# Patient Record
Sex: Female | Born: 1950 | Race: White | Hispanic: No | Smoking: Current every day smoker
Health system: Southern US, Community
[De-identification: ages and names within clinical notes are randomized; demographics above are authoritative.]

## PROBLEM LIST (undated history)

## (undated) DIAGNOSIS — I639 Cerebral infarction, unspecified: Secondary | ICD-10-CM

## (undated) DIAGNOSIS — I517 Cardiomegaly: Secondary | ICD-10-CM

## (undated) DIAGNOSIS — K829 Disease of gallbladder, unspecified: Secondary | ICD-10-CM

## (undated) DIAGNOSIS — Z950 Presence of cardiac pacemaker: Secondary | ICD-10-CM

## (undated) DIAGNOSIS — I219 Acute myocardial infarction, unspecified: Secondary | ICD-10-CM

## (undated) DIAGNOSIS — F4310 Post-traumatic stress disorder, unspecified: Secondary | ICD-10-CM

## (undated) DIAGNOSIS — I340 Nonrheumatic mitral (valve) insufficiency: Secondary | ICD-10-CM

## (undated) DIAGNOSIS — I7100 Dissection of unspecified site of aorta: Secondary | ICD-10-CM

## (undated) DIAGNOSIS — I509 Heart failure, unspecified: Secondary | ICD-10-CM

## (undated) DIAGNOSIS — G4730 Sleep apnea, unspecified: Secondary | ICD-10-CM

## (undated) DIAGNOSIS — I071 Rheumatic tricuspid insufficiency: Secondary | ICD-10-CM

## (undated) DIAGNOSIS — E119 Type 2 diabetes mellitus without complications: Secondary | ICD-10-CM

## (undated) DIAGNOSIS — Z972 Presence of dental prosthetic device (complete) (partial): Secondary | ICD-10-CM

## (undated) DIAGNOSIS — Z95810 Presence of automatic (implantable) cardiac defibrillator: Secondary | ICD-10-CM

## (undated) DIAGNOSIS — I2721 Secondary pulmonary arterial hypertension: Secondary | ICD-10-CM

## (undated) HISTORY — PX: CARDIAC SURGERY: SHX584

## (undated) HISTORY — PX: ABDOMINAL HYSTERECTOMY: SHX81

## (undated) HISTORY — PX: STENT PLACE LEFT URETER (ARMC HX): HXRAD1254

## (undated) HISTORY — PX: INSERT / REPLACE / REMOVE PACEMAKER: SUR710

## (undated) HISTORY — PX: CHOLECYSTECTOMY: SHX55

## (undated) HISTORY — PX: CARDIAC CATHETERIZATION: SHX172

---

## 2008-01-07 ENCOUNTER — Inpatient Hospital Stay: Payer: Self-pay | Admitting: Surgery

## 2008-01-07 ENCOUNTER — Other Ambulatory Visit: Payer: Self-pay

## 2008-05-02 DIAGNOSIS — I7100 Dissection of unspecified site of aorta: Secondary | ICD-10-CM

## 2008-05-02 HISTORY — PX: CAROTID STENT: SHX1301

## 2008-05-02 HISTORY — DX: Dissection of unspecified site of aorta: I71.00

## 2009-01-24 DIAGNOSIS — I219 Acute myocardial infarction, unspecified: Secondary | ICD-10-CM

## 2009-01-24 HISTORY — DX: Acute myocardial infarction, unspecified: I21.9

## 2009-02-07 ENCOUNTER — Inpatient Hospital Stay: Payer: Self-pay | Admitting: Internal Medicine

## 2009-09-07 ENCOUNTER — Ambulatory Visit: Payer: Self-pay | Admitting: Family Medicine

## 2009-09-10 ENCOUNTER — Ambulatory Visit: Payer: Self-pay | Admitting: Family Medicine

## 2009-09-18 ENCOUNTER — Emergency Department: Payer: Self-pay | Admitting: Emergency Medicine

## 2009-09-19 ENCOUNTER — Encounter (INDEPENDENT_AMBULATORY_CARE_PROVIDER_SITE_OTHER): Payer: Self-pay | Admitting: Emergency Medicine

## 2009-09-19 ENCOUNTER — Inpatient Hospital Stay (HOSPITAL_COMMUNITY)
Admission: EM | Admit: 2009-09-19 | Discharge: 2009-09-23 | Payer: Self-pay | Source: Home / Self Care | Admitting: Emergency Medicine

## 2009-09-19 ENCOUNTER — Ambulatory Visit: Payer: Self-pay | Admitting: Vascular Surgery

## 2009-09-22 ENCOUNTER — Encounter (INDEPENDENT_AMBULATORY_CARE_PROVIDER_SITE_OTHER): Payer: Self-pay | Admitting: Internal Medicine

## 2010-07-19 LAB — RPR: RPR Ser Ql: NONREACTIVE

## 2010-07-19 LAB — POCT I-STAT, CHEM 8
Calcium, Ion: 1.1 mmol/L — ABNORMAL LOW (ref 1.12–1.32)
Chloride: 109 mEq/L (ref 96–112)
Creatinine, Ser: 0.7 mg/dL (ref 0.4–1.2)
Glucose, Bld: 129 mg/dL — ABNORMAL HIGH (ref 70–99)
HCT: 42 % (ref 36.0–46.0)
Hemoglobin: 14.3 g/dL (ref 12.0–15.0)
TCO2: 25 mmol/L (ref 0–100)

## 2010-07-19 LAB — BASIC METABOLIC PANEL
BUN: 13 mg/dL (ref 6–23)
BUN: 16 mg/dL (ref 6–23)
CO2: 28 mEq/L (ref 19–32)
Chloride: 104 mEq/L (ref 96–112)
Creatinine, Ser: 0.74 mg/dL (ref 0.4–1.2)
GFR calc non Af Amer: >60 mL/min (ref >60)
GFR calc non Af Amer: >60 mL/min (ref >60)
Glucose, Bld: 94 mg/dL (ref 70–99)
Sodium: 140 mEq/L (ref 135–145)

## 2010-07-19 LAB — LIPID PANEL
Total CHOL/HDL Ratio: 4.3 RATIO
Triglycerides: 119 mg/dL (ref <150)
VLDL: 24 mg/dL (ref 0–40)

## 2010-07-19 LAB — DIFFERENTIAL
Basophils Absolute: 0.1 10*3/uL (ref 0.0–0.1)
Lymphocytes Relative: 12 % (ref 12–46)
Monocytes Absolute: 0.4 10*3/uL (ref 0.1–1.0)
Monocytes Relative: 4 % (ref 3–12)
Neutrophils Relative %: 83 % — ABNORMAL HIGH (ref 43–77)

## 2010-07-19 LAB — HEPATIC FUNCTION PANEL
ALT: 14 U/L (ref 0–35)
Albumin: 3.2 g/dL — ABNORMAL LOW (ref 3.5–5.2)
Alkaline Phosphatase: 110 U/L (ref 39–117)
Total Protein: 6.6 g/dL (ref 6.0–8.3)

## 2010-07-19 LAB — CK TOTAL AND CKMB (NOT AT ARMC): Relative Index: INVALID (ref 0.0–2.5)

## 2010-07-19 LAB — POCT CARDIAC MARKERS
CKMB, poc: 1.4 ng/mL (ref 1.0–8.0)
Myoglobin, poc: 65.4 ng/mL (ref 12–200)
Troponin i, poc: <0.05 ng/mL (ref 0.00–0.09)
Troponin i, poc: <0.05 ng/mL (ref 0.00–0.09)

## 2010-07-19 LAB — COMPREHENSIVE METABOLIC PANEL
Alkaline Phosphatase: 105 U/L (ref 39–117)
BUN: 13 mg/dL (ref 6–23)
Calcium: 9 mg/dL (ref 8.4–10.5)
GFR calc Af Amer: >60 mL/min (ref >60)
GFR calc non Af Amer: >60 mL/min (ref >60)
Glucose, Bld: 125 mg/dL — ABNORMAL HIGH (ref 70–99)
Potassium: 3.8 mEq/L (ref 3.5–5.1)
Total Protein: 7 g/dL (ref 6.0–8.3)

## 2010-07-19 LAB — CBC
HCT: 35.2 % — ABNORMAL LOW (ref 36.0–46.0)
Hemoglobin: 11.7 g/dL — ABNORMAL LOW (ref 12.0–15.0)
MCHC: 33.2 g/dL (ref 30.0–36.0)
MCV: 79.4 fL (ref 78.0–100.0)
Platelets: 220 10*3/uL (ref 150–400)
Platelets: 234 10*3/uL (ref 150–400)
RBC: 4.44 MIL/uL (ref 3.87–5.11)
RBC: 4.86 MIL/uL (ref 3.87–5.11)
RDW: 17.7 % — ABNORMAL HIGH (ref 11.5–15.5)
RDW: 17.8 % — ABNORMAL HIGH (ref 11.5–15.5)
WBC: 8.4 10*3/uL (ref 4.0–10.5)
WBC: 9.7 10*3/uL (ref 4.0–10.5)

## 2010-07-19 LAB — URINALYSIS, ROUTINE W REFLEX MICROSCOPIC
Hgb urine dipstick: NEGATIVE
Nitrite: NEGATIVE
Specific Gravity, Urine: 1.026 (ref 1.005–1.030)
Urobilinogen, UA: 0.2 mg/dL (ref 0.0–1.0)

## 2010-07-19 LAB — PROTIME-INR
INR: 1.02 (ref 0.00–1.49)
Prothrombin Time: 13 seconds (ref 11.6–15.2)
Prothrombin Time: 13.3 seconds (ref 11.6–15.2)

## 2010-07-19 LAB — TROPONIN I: Troponin I: 0.04 ng/mL (ref 0.00–0.06)

## 2010-07-19 LAB — LIPASE, BLOOD: Lipase: 28 U/L (ref 11–59)

## 2010-07-19 LAB — TSH: TSH: 1.847 u[IU]/mL (ref 0.350–4.500)

## 2010-07-19 LAB — CARDIAC PANEL(CRET KIN+CKTOT+MB+TROPI)
CK, MB: 1.6 ng/mL (ref 0.3–4.0)
Total CK: 40 U/L (ref 7–177)
Troponin I: 0.05 ng/mL (ref 0.00–0.06)

## 2010-07-19 LAB — HEMOGLOBIN A1C: Mean Plasma Glucose: 131 mg/dL — ABNORMAL HIGH (ref <117)

## 2011-02-26 IMAGING — CT CT HEAD W/O CM
1 of 2 series · 13 of 30 positions shown, 17 images · non-contrast
Comparison: CT 09/19/2009.

CLINICAL DATA: Dizziness.  Rule out stroke.

CT HEAD WITHOUT CONTRAST
TECHNIQUE: Contiguous axial images were obtained from the base of
the skull through the vertex without contrast.

[Series 2: brain · axial · 0.47mm/px · z∈[+127,+254]mm · 13 of 28 slices shown, 17 images]
[im 2/28  brain]
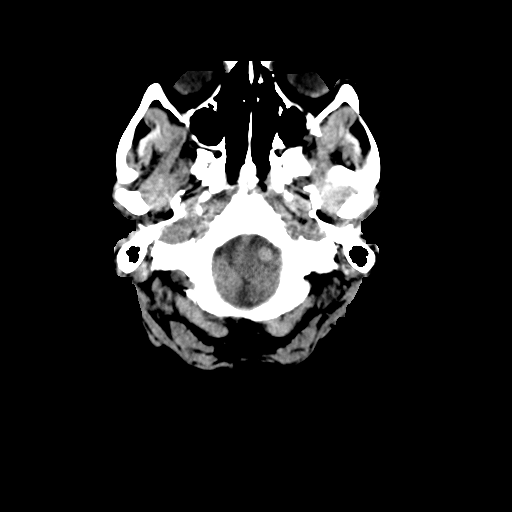
[im 2/28  bone]
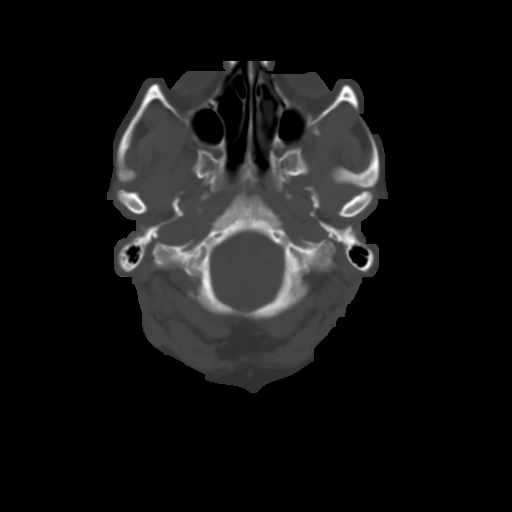
[im 4/28  brain]
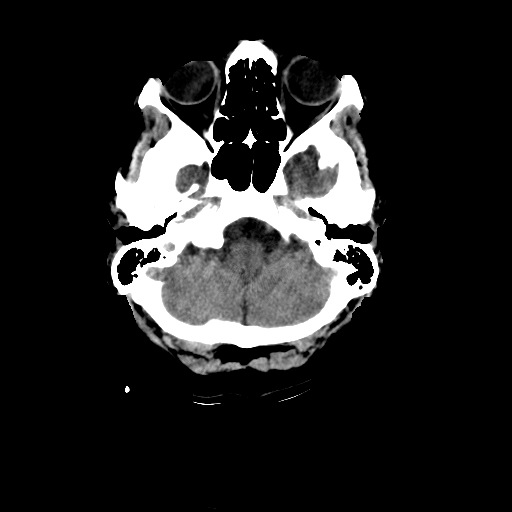
[im 6/28  brain]
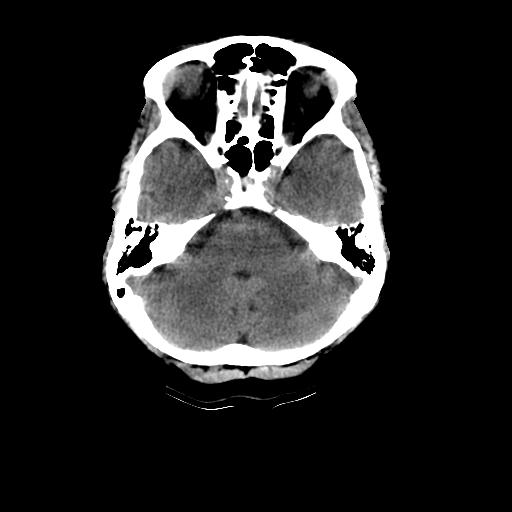
[im 8/28  brain]
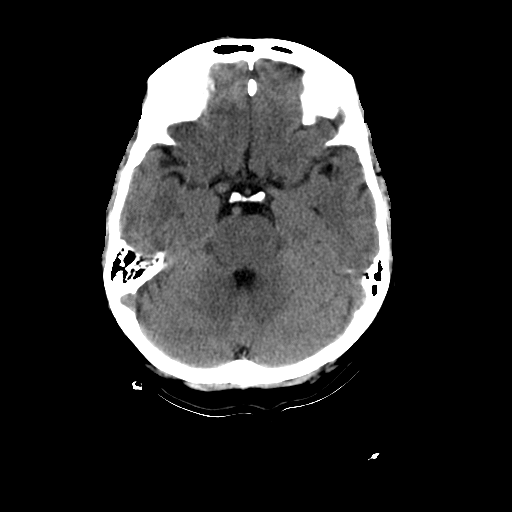
[im 10/28  brain]
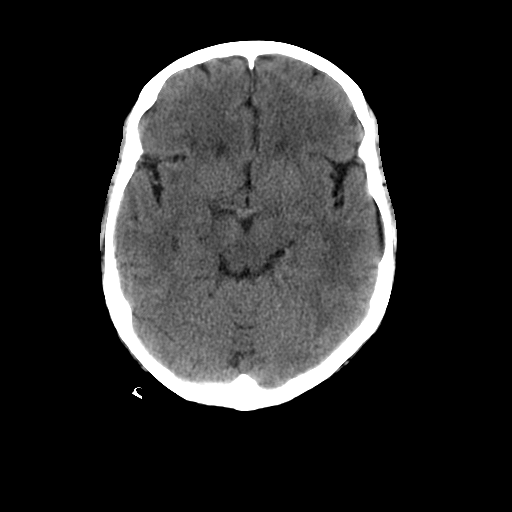
[im 10/28  bone]
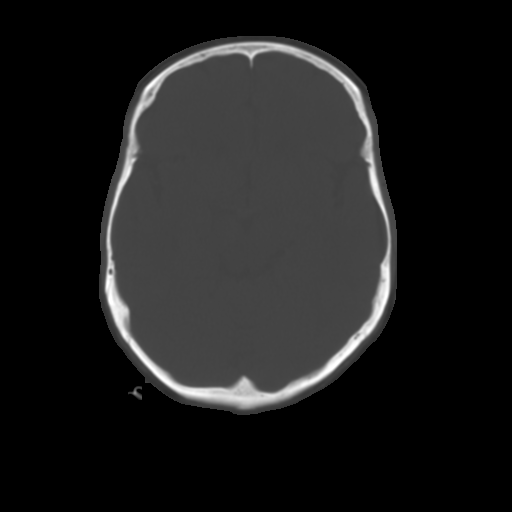
[im 12/28  brain]
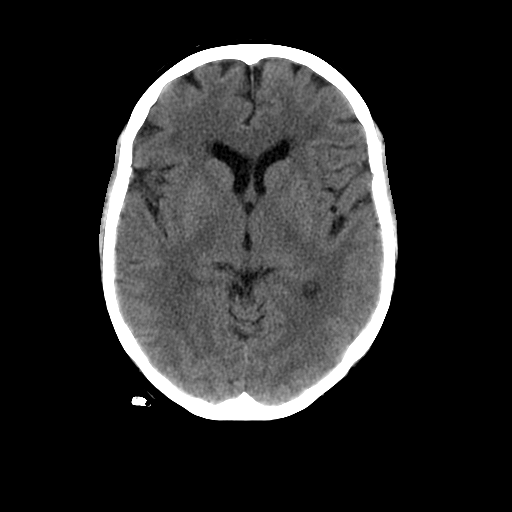
[im 14/28  brain]
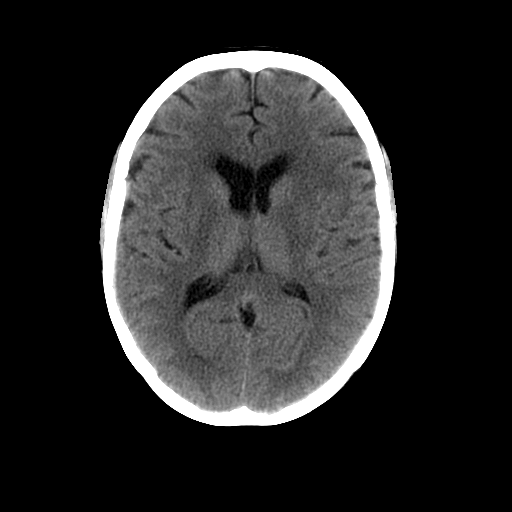
[im 16/28  brain]
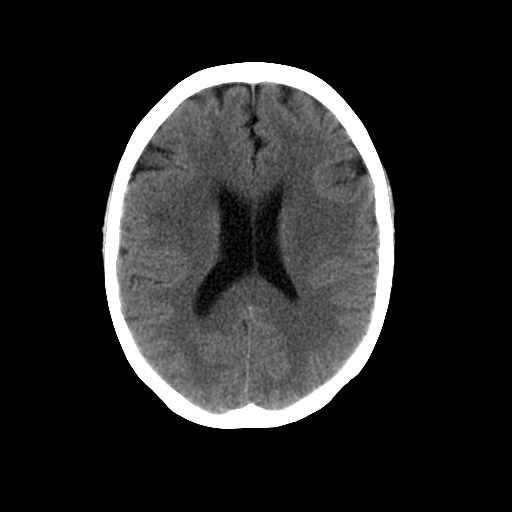
[im 18/28  brain]
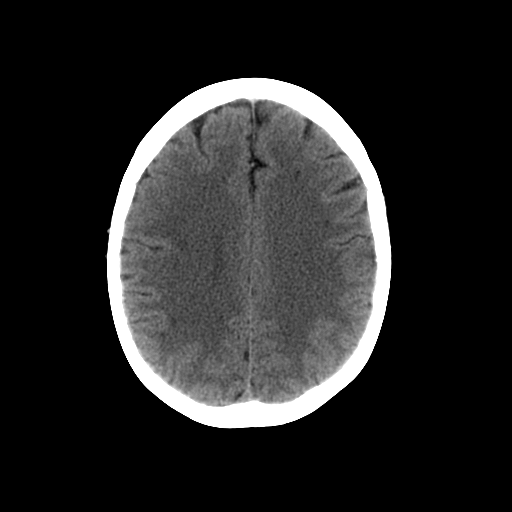
[im 18/28  bone]
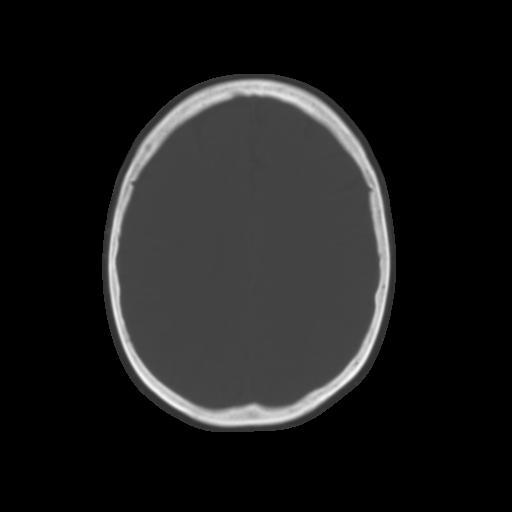
[im 20/28  brain]
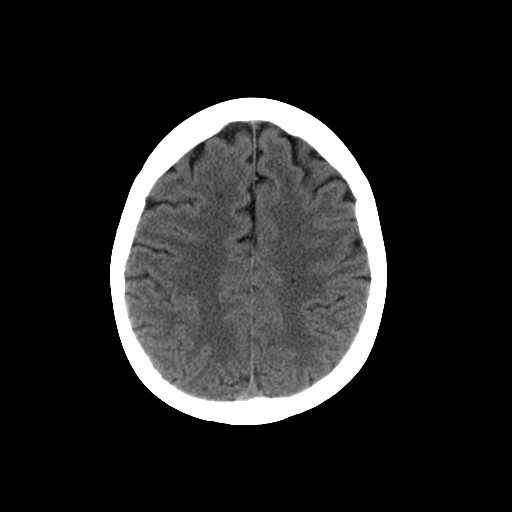
[im 22/28  brain]
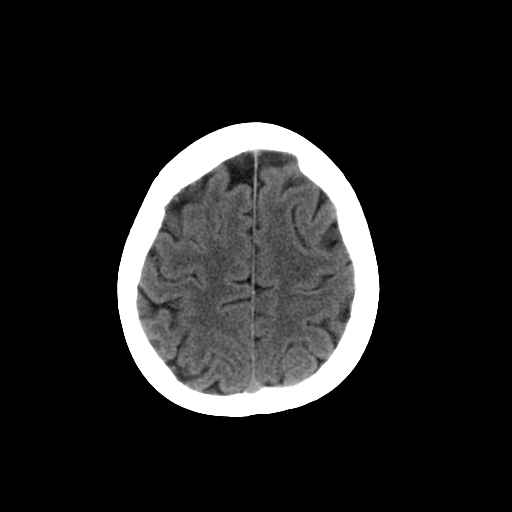
[im 24/28  brain]
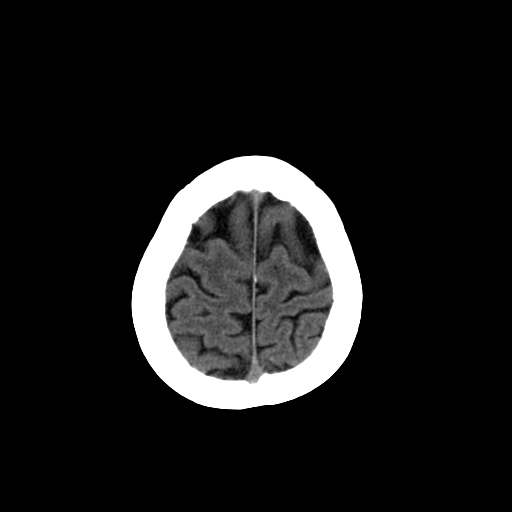
[im 26/28  brain]
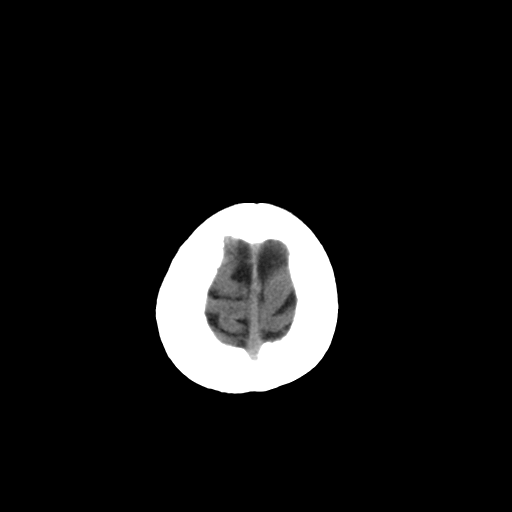
[im 26/28  bone]
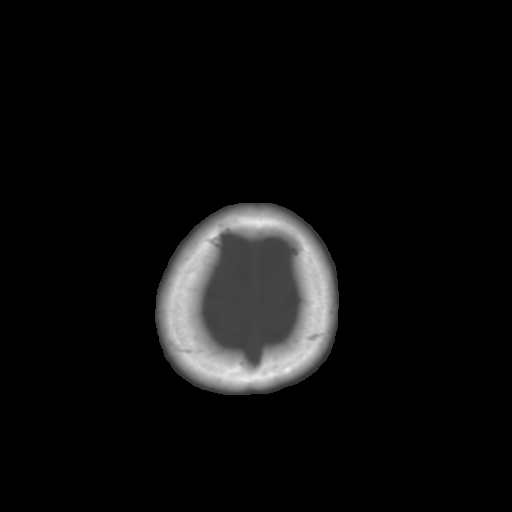

[13 of 30 positions shown; findings below may reference images not displayed]

FINDINGS: Chronic microvascular ischemia in the white matter is
unchanged.  Ill-defined hypodensity in the right posterior frontal
lobe is also unchanged and  may be a subacute or chronic
infarction.  No prior baseline studies available.

There is no hemorrhage.  Negative for mass lesion.  Ventricle size
is normal.  The calvarium is intact.
IMPRESSION: Ill-defined hypodensity in the right posterior frontal lobe may
represent a subacute or chronic infarction.

Negative for intracranial hemorrhage.  No significant change from
yesterday.

## 2011-02-28 ENCOUNTER — Emergency Department: Payer: Self-pay | Admitting: *Deleted

## 2011-11-22 DIAGNOSIS — I255 Ischemic cardiomyopathy: Secondary | ICD-10-CM | POA: Insufficient documentation

## 2011-11-22 DIAGNOSIS — M519 Unspecified thoracic, thoracolumbar and lumbosacral intervertebral disc disorder: Secondary | ICD-10-CM | POA: Insufficient documentation

## 2011-11-22 DIAGNOSIS — Z8673 Personal history of transient ischemic attack (TIA), and cerebral infarction without residual deficits: Secondary | ICD-10-CM | POA: Insufficient documentation

## 2011-11-22 DIAGNOSIS — I739 Peripheral vascular disease, unspecified: Secondary | ICD-10-CM | POA: Insufficient documentation

## 2011-11-22 DIAGNOSIS — K8050 Calculus of bile duct without cholangitis or cholecystitis without obstruction: Secondary | ICD-10-CM | POA: Insufficient documentation

## 2011-11-22 DIAGNOSIS — I2510 Atherosclerotic heart disease of native coronary artery without angina pectoris: Secondary | ICD-10-CM | POA: Insufficient documentation

## 2011-11-22 DIAGNOSIS — E785 Hyperlipidemia, unspecified: Secondary | ICD-10-CM | POA: Insufficient documentation

## 2011-11-22 DIAGNOSIS — I7100 Dissection of unspecified site of aorta: Secondary | ICD-10-CM | POA: Insufficient documentation

## 2012-06-01 ENCOUNTER — Ambulatory Visit: Payer: Self-pay | Admitting: Otolaryngology

## 2012-07-31 ENCOUNTER — Ambulatory Visit: Payer: Self-pay | Admitting: Otolaryngology

## 2012-08-07 ENCOUNTER — Ambulatory Visit: Payer: Self-pay | Admitting: Otolaryngology

## 2012-11-05 ENCOUNTER — Ambulatory Visit: Payer: Self-pay | Admitting: Family Medicine

## 2013-04-10 DIAGNOSIS — I469 Cardiac arrest, cause unspecified: Secondary | ICD-10-CM | POA: Insufficient documentation

## 2013-04-10 DIAGNOSIS — R55 Syncope and collapse: Secondary | ICD-10-CM | POA: Insufficient documentation

## 2014-02-17 ENCOUNTER — Ambulatory Visit: Payer: Self-pay | Admitting: Family Medicine

## 2014-05-08 DIAGNOSIS — E119 Type 2 diabetes mellitus without complications: Secondary | ICD-10-CM | POA: Insufficient documentation

## 2014-05-08 DIAGNOSIS — E1165 Type 2 diabetes mellitus with hyperglycemia: Secondary | ICD-10-CM | POA: Insufficient documentation

## 2014-11-22 ENCOUNTER — Encounter: Payer: Self-pay | Admitting: Emergency Medicine

## 2014-11-22 ENCOUNTER — Inpatient Hospital Stay
Admission: EM | Admit: 2014-11-22 | Discharge: 2014-11-25 | DRG: 603 | Disposition: A | Discharge status: Home / Self Care | Payer: Medicare Other | Attending: Internal Medicine | Admitting: Internal Medicine

## 2014-11-22 DIAGNOSIS — I959 Hypotension, unspecified: Secondary | ICD-10-CM | POA: Diagnosis present

## 2014-11-22 DIAGNOSIS — I2510 Atherosclerotic heart disease of native coronary artery without angina pectoris: Secondary | ICD-10-CM | POA: Diagnosis not present

## 2014-11-22 DIAGNOSIS — S51852A Open bite of left forearm, initial encounter: Secondary | ICD-10-CM | POA: Diagnosis not present

## 2014-11-22 DIAGNOSIS — E1165 Type 2 diabetes mellitus with hyperglycemia: Secondary | ICD-10-CM | POA: Diagnosis not present

## 2014-11-22 DIAGNOSIS — I4891 Unspecified atrial fibrillation: Secondary | ICD-10-CM | POA: Diagnosis not present

## 2014-11-22 DIAGNOSIS — Z203 Contact with and (suspected) exposure to rabies: Secondary | ICD-10-CM | POA: Diagnosis present

## 2014-11-22 DIAGNOSIS — I509 Heart failure, unspecified: Secondary | ICD-10-CM | POA: Diagnosis present

## 2014-11-22 DIAGNOSIS — L03114 Cellulitis of left upper limb: Secondary | ICD-10-CM | POA: Diagnosis present

## 2014-11-22 DIAGNOSIS — I739 Peripheral vascular disease, unspecified: Secondary | ICD-10-CM | POA: Diagnosis not present

## 2014-11-22 DIAGNOSIS — S51859A Open bite of unspecified forearm, initial encounter: Secondary | ICD-10-CM | POA: Diagnosis present

## 2014-11-22 DIAGNOSIS — I10 Essential (primary) hypertension: Secondary | ICD-10-CM | POA: Diagnosis not present

## 2014-11-22 DIAGNOSIS — I252 Old myocardial infarction: Secondary | ICD-10-CM | POA: Diagnosis not present

## 2014-11-22 DIAGNOSIS — Z8249 Family history of ischemic heart disease and other diseases of the circulatory system: Secondary | ICD-10-CM | POA: Diagnosis not present

## 2014-11-22 DIAGNOSIS — E871 Hypo-osmolality and hyponatremia: Secondary | ICD-10-CM | POA: Diagnosis present

## 2014-11-22 DIAGNOSIS — Y92009 Unspecified place in unspecified non-institutional (private) residence as the place of occurrence of the external cause: Secondary | ICD-10-CM | POA: Diagnosis not present

## 2014-11-22 DIAGNOSIS — E785 Hyperlipidemia, unspecified: Secondary | ICD-10-CM | POA: Diagnosis not present

## 2014-11-22 DIAGNOSIS — Z7901 Long term (current) use of anticoagulants: Secondary | ICD-10-CM | POA: Diagnosis not present

## 2014-11-22 DIAGNOSIS — L089 Local infection of the skin and subcutaneous tissue, unspecified: Secondary | ICD-10-CM | POA: Diagnosis present

## 2014-11-22 DIAGNOSIS — W5501XA Bitten by cat, initial encounter: Secondary | ICD-10-CM | POA: Diagnosis present

## 2014-11-22 DIAGNOSIS — L03113 Cellulitis of right upper limb: Secondary | ICD-10-CM | POA: Diagnosis present

## 2014-11-22 HISTORY — DX: Dissection of unspecified site of aorta: I71.00

## 2014-11-22 HISTORY — DX: Acute myocardial infarction, unspecified: I21.9

## 2014-11-22 HISTORY — DX: Disease of gallbladder, unspecified: K82.9

## 2014-11-22 HISTORY — DX: Type 2 diabetes mellitus without complications: E11.9

## 2014-11-22 HISTORY — DX: Heart failure, unspecified: I50.9

## 2014-11-22 LAB — BASIC METABOLIC PANEL
Anion gap: 13 (ref 5–15)
BUN: 18 mg/dL (ref 6–20)
CALCIUM: 8.8 mg/dL — AB (ref 8.9–10.3)
CO2: 22 mmol/L (ref 22–32)
CREATININE: 1.01 mg/dL — AB (ref 0.44–1.00)
Chloride: 91 mmol/L — ABNORMAL LOW (ref 101–111)
GFR calc Af Amer: >60 mL/min (ref >60)
GFR, EST NON AFRICAN AMERICAN: 59 mL/min — AB (ref >60)
GLUCOSE: 534 mg/dL — AB (ref 65–99)
Potassium: 4 mmol/L (ref 3.5–5.1)
SODIUM: 126 mmol/L — AB (ref 135–145)

## 2014-11-22 LAB — CBC WITH DIFFERENTIAL/PLATELET
Basophils Absolute: 0.1 10*3/uL (ref 0–0.1)
Basophils Relative: 1 %
EOS PCT: 0 %
Eosinophils Absolute: 0 10*3/uL (ref 0–0.7)
HCT: 39.4 % (ref 35.0–47.0)
Hemoglobin: 12.9 g/dL (ref 12.0–16.0)
LYMPHS ABS: 2 10*3/uL (ref 1.0–3.6)
Lymphocytes Relative: 17 %
MCH: 28.2 pg (ref 26.0–34.0)
MCHC: 32.6 g/dL (ref 32.0–36.0)
MCV: 86.4 fL (ref 80.0–100.0)
Monocytes Absolute: 0.9 10*3/uL (ref 0.2–0.9)
Monocytes Relative: 8 %
NEUTROS ABS: 8.5 10*3/uL — AB (ref 1.4–6.5)
NEUTROS PCT: 74 %
PLATELETS: 228 10*3/uL (ref 150–440)
RBC: 4.56 MIL/uL (ref 3.80–5.20)
RDW: 14 % (ref 11.5–14.5)
WBC: 11.5 10*3/uL — AB (ref 3.6–11.0)

## 2014-11-22 LAB — PROTIME-INR
INR: 1.69
Prothrombin Time: 20.1 seconds — ABNORMAL HIGH (ref 11.4–15.0)

## 2014-11-22 LAB — GLUCOSE, CAPILLARY: GLUCOSE-CAPILLARY: 369 mg/dL — AB (ref 65–99)

## 2014-11-22 MED ORDER — WARFARIN SODIUM 7.5 MG PO TABS: 7.5000 mg | ORAL_TABLET | ORAL | Status: DC

## 2014-11-22 MED ORDER — RABIES VACCINE, PCEC IM SUSR
1.0000 mL | Freq: Once | INTRAMUSCULAR | Status: AC
  Administered 2014-11-22: 1 mL via INTRAMUSCULAR
  Filled 2014-11-22: qty 1

## 2014-11-22 MED ORDER — RABIES IMMUNE GLOBULIN 150 UNIT/ML IM INJ
20.0000 [IU]/kg | INJECTION | Freq: Once | INTRAMUSCULAR | Status: AC
  Administered 2014-11-22: 2250 [IU] via INTRAMUSCULAR
  Filled 2014-11-22: qty 15

## 2014-11-22 MED ORDER — SODIUM CHLORIDE 0.9 % IV BOLUS (SEPSIS)
1000.0000 mL | Freq: Once | INTRAVENOUS | Status: AC
  Administered 2014-11-22: 1000 mL via INTRAVENOUS

## 2014-11-22 MED ORDER — AMOXICILLIN-POT CLAVULANATE 875-125 MG PO TABS
1.0000 | ORAL_TABLET | Freq: Once | ORAL | Status: AC
  Administered 2014-11-22: 1 via ORAL
  Filled 2014-11-22: qty 1

## 2014-11-22 MED ORDER — CLINDAMYCIN PHOSPHATE 600 MG/50ML IV SOLN
600.0000 mg | Freq: Once | INTRAVENOUS | Status: AC
  Administered 2014-11-22: 600 mg via INTRAVENOUS
  Filled 2014-11-22: qty 50

## 2014-11-22 MED ORDER — INSULIN ASPART 100 UNIT/ML ~~LOC~~ SOLN
0.0000 [IU] | Freq: Three times a day (TID) | SUBCUTANEOUS | Status: DC
Start: 2014-11-23 — End: 2014-11-25
  Administered 2014-11-23: 9 [IU] via SUBCUTANEOUS
  Administered 2014-11-23: 7 [IU] via SUBCUTANEOUS
  Administered 2014-11-24: 9 [IU] via SUBCUTANEOUS
  Administered 2014-11-24: 5 [IU] via SUBCUTANEOUS
  Administered 2014-11-24: 3 [IU] via SUBCUTANEOUS
  Filled 2014-11-22: qty 5
  Filled 2014-11-22: qty 9
  Filled 2014-11-22: qty 7
  Filled 2014-11-22: qty 9
  Filled 2014-11-22: qty 3

## 2014-11-22 MED ORDER — WARFARIN SODIUM 7.5 MG PO TABS
3.7500 mg | ORAL_TABLET | ORAL | Status: DC
  Filled 2014-11-22: qty 0.5

## 2014-11-22 MED ORDER — WARFARIN - PHARMACIST DOSING INPATIENT
Freq: Every day | Status: DC
  Filled 2014-11-22 (×4): qty 1

## 2014-11-22 MED ORDER — TRAMADOL HCL 50 MG PO TABS
50.0000 mg | ORAL_TABLET | Freq: Once | ORAL | Status: AC
  Administered 2014-11-22: 50 mg via ORAL
  Filled 2014-11-22: qty 1

## 2014-11-22 NOTE — ED Notes (Signed)
Pt. Was bitten by a straw cat on Thursday.  Today pt. Has increased swelling to the area today.  Pt. Bit marks have redness.  Pt. States decreased movement to areas.

## 2014-11-22 NOTE — Progress Notes (Signed)
Asked by Hospitalist to evaluate this elderly female who sustained a cat bit to her left forearm Thursday  Presents with exquisite pain with any motion of left hand/fingers.  PE:  Ill appearing female Multiple puncture marks on left forearm Significant pain with passive ROM of fingers, moderate swelling and erythema arm.  IMP  Tenosynovitis.  Rec:  This is an ortho/hand issue.  Outside my realm of practice  Discussed directly with hospitalist service.

## 2014-11-22 NOTE — ED Provider Notes (Signed)
[facility] Emergency Department Provider Note  ____________________________________________  Time seen: Approximately 7:26 PM  I have reviewed the triage vital signs and the nursing notes.   HISTORY  Chief Complaint Animal Bite    HPI Desiree Mccall is a 64 y.o. female pain and edema to the right lower arm secondary to multiple cat bites. Patient states she was trying to restrain the animal from going outside. Patient states she's not noted immunization at this Because she just keep her from the past 2 weeks. Incident occurred 2 days ago. Patient went to a primary care for routine lab tests and they did not check the arm. Patient also has not notify animal control of this incident. Patient state the hand is becoming swollen and is hard for her to do any lifting. Patient is rating her pain as 8/10. No palliative measures for this complaint. Patient is right-hand dominant.    Past Medical History  Diagnosis Date  . Aortic dissection 2010  . Heart attack sept. 25, 2010  . Diabetes mellitus without complication   . CHF (congestive heart failure)   . [name]     There are no active problems to display for this patient.   Past Surgical History  Procedure Laterality Date  . Cholecystectomy    . Cardiac surgery    . Abdominal hysterectomy    . Carotid stent  2010  . Stent place left ureter ([facility] hx)      No current outpatient prescriptions on file.  Allergies Review of patient's allergies indicates no known allergies.  Family History  Problem Relation Age of Onset  . Heart failure Mother   . Heart failure Father     Social History History  Substance Use Topics  . Smoking status: Never Smoker   . Smokeless tobacco: Not on file  . Alcohol Use: No    Review of Systems Constitutional: No fever/chills Eyes: No visual changes. ENT: No sore throat. Cardiovascular: Denies chest pain. Respiratory: Denies shortness of  breath. Gastrointestinal: No abdominal pain.  No nausea, no vomiting.  No diarrhea.  No constipation. Genitourinary: Negative for dysuria. Musculoskeletal: Negative for back pain. Skin: Negative for rash. Neurological: Negative for headaches, focal weakness or numbness. Endocrine:Diabetes Hematological/Lymphatic: Allergic/Immunilogical:  10-point ROS otherwise negative.  ____________________________________________   PHYSICAL EXAM:  VITAL SIGNS: ED Triage Vitals  Enc Vitals Group     BP 11/22/14 1825 110/58 mmHg     Pulse Rate 11/22/14 1825 79     Resp 11/22/14 1825 18     Temp 11/22/14 1825 99 F (37.2 C)     Temp Source 11/22/14 1825 Oral     SpO2 11/22/14 1825 97 %     Weight --      Height --      Head Cir --      Peak Flow --      Pain Score 11/22/14 1826 8     Pain Loc --      Pain Edu? --      Excl. in GC? --     Constitutional: Alert and oriented. Well appearing and in no acute distress. Eyes: Conjunctivae are normal. PERRL. EOMI. Head: Atraumatic. Nose: No congestion/rhinnorhea. Mouth/Throat: Mucous membranes are moist.  Oropharynx non-erythematous. Neck: No stridor. No cervical spine tenderness to palpation. Hematological/Lymphatic/Immunilogical: No cervical lymphadenopathy. Cardiovascular: Normal rate, regular rhythm. Grossly normal heart sounds.  Good peripheral circulation. Respiratory: Normal respiratory effort.  No retractions. Lungs CTAB. Gastrointestinal: Soft and nontender. No  distention. No abdominal bruits. No CVA tenderness. Musculoskeletal: Obvious edema and erythema to the lower right upper extremity. Neurovascular intact but tender palpation mid forearm.  Neurologic:  Normal speech and language. No gross focal neurologic deficits are appreciated. No gait instability. Skin: There are multiple shallow puncture wounds to the right lower extremity. Moderate edema and erythema noted. Psychiatric: Mood and affect are normal. Speech and behavior are  normal.  ____________________________________________   LABS (all labs ordered are listed, but only abnormal results are displayed)  Labs Reviewed  BASIC METABOLIC PANEL - Abnormal; Notable for the following:    Sodium 126 (*)    Chloride 91 (*)    Glucose, Bld 534 (*)    Creatinine, Ser 1.01 (*)    Calcium 8.8 (*)    GFR calc non Af Amer 59 (*)    All other components within normal limits  CBC WITH DIFFERENTIAL/PLATELET - Abnormal; Notable for the following:    WBC 11.5 (*)    Neutro Abs 8.5 (*)    All other components within normal limits   ____________________________________________  EKG   ____________________________________________  RADIOLOGY   ____________________________________________   PROCEDURES  Procedure(s) performed: None  Critical Care performed: No  ____________________________________________   INITIAL IMPRESSION / ASSESSMENT AND PLAN / ED COURSE  Pertinent labs & imaging results that were available during my care of the patient were reviewed by me and considered in my medical decision making (see chart for details).  Cellulitis secondary to cat bite. Discussed lab  results with patient.  Patient blood sugars 533 combined with cellulitis to left forearm candidates for admission. ____________________________________________   FINAL CLINICAL IMPRESSION(S) / ED DIAGNOSES  Final diagnoses:  Cat bite, initial encounter      Joni Reining, PA-C 11/22/14 2102  Joni Reining, PA-C 11/22/14 2103  Sharyn Creamer, MD 11/22/14 [phone]

## 2014-11-22 NOTE — ED Notes (Signed)
Pt. States cat bit to lower rt. Extremity Thursday morning.  Pt. States increased swelling to lower rt. Arm this a.m.  Pt. Has swelling to rt. Lower extremity. Pt. Has multiple puncture marks to rt. Lower arm.  Pt. Has minor redness to outside of bit marks.  Pt. States difficulty using arm today without pain.

## 2014-11-23 ENCOUNTER — Encounter: Payer: Self-pay | Admitting: *Deleted

## 2014-11-23 DIAGNOSIS — L03114 Cellulitis of left upper limb: Secondary | ICD-10-CM | POA: Diagnosis not present

## 2014-11-23 LAB — COMPREHENSIVE METABOLIC PANEL
ALK PHOS: 108 U/L (ref 38–126)
ALT: 21 U/L (ref 14–54)
AST: 20 U/L (ref 15–41)
Albumin: 3.1 g/dL — ABNORMAL LOW (ref 3.5–5.0)
Anion gap: 12 (ref 5–15)
BUN: 14 mg/dL (ref 6–20)
CO2: 21 mmol/L — ABNORMAL LOW (ref 22–32)
Calcium: 8.4 mg/dL — ABNORMAL LOW (ref 8.9–10.3)
Chloride: 101 mmol/L (ref 101–111)
Creatinine, Ser: 0.81 mg/dL (ref 0.44–1.00)
GLUCOSE: 443 mg/dL — AB (ref 65–99)
POTASSIUM: 4.1 mmol/L (ref 3.5–5.1)
Sodium: 134 mmol/L — ABNORMAL LOW (ref 135–145)
TOTAL PROTEIN: 6.3 g/dL — AB (ref 6.5–8.1)
Total Bilirubin: 1.2 mg/dL (ref 0.3–1.2)

## 2014-11-23 LAB — CBC
HCT: 34.4 % — ABNORMAL LOW (ref 35.0–47.0)
Hemoglobin: 11.3 g/dL — ABNORMAL LOW (ref 12.0–16.0)
MCH: 28.5 pg (ref 26.0–34.0)
MCHC: 32.8 g/dL (ref 32.0–36.0)
MCV: 86.9 fL (ref 80.0–100.0)
PLATELETS: 193 10*3/uL (ref 150–440)
RBC: 3.96 MIL/uL (ref 3.80–5.20)
RDW: 14.2 % (ref 11.5–14.5)
WBC: 11.6 10*3/uL — ABNORMAL HIGH (ref 3.6–11.0)

## 2014-11-23 LAB — GLUCOSE, CAPILLARY
Glucose-Capillary: 415 mg/dL — ABNORMAL HIGH (ref 65–99)
Glucose-Capillary: 380 mg/dL — ABNORMAL HIGH (ref 65–99)
Glucose-Capillary: 301 mg/dL — ABNORMAL HIGH (ref 65–99)
Glucose-Capillary: 303 mg/dL — ABNORMAL HIGH (ref 65–99)

## 2014-11-23 LAB — PROTIME-INR
INR: 1.74
PROTHROMBIN TIME: 20.5 s — AB (ref 11.4–15.0)

## 2014-11-23 LAB — HEMOGLOBIN A1C: HEMOGLOBIN A1C: 14.9 % — AB (ref 4.0–6.0)

## 2014-11-23 MED ORDER — WARFARIN SODIUM 7.5 MG PO TABS
7.5000 mg | ORAL_TABLET | Freq: Once | ORAL | Status: AC
  Administered 2014-11-23: 7.5 mg via ORAL
  Filled 2014-11-23: qty 1

## 2014-11-23 MED ORDER — ENOXAPARIN SODIUM 40 MG/0.4ML ~~LOC~~ SOLN
40.0000 mg | SUBCUTANEOUS | Status: DC
  Administered 2014-11-23 – 2014-11-25 (×3): 40 mg via SUBCUTANEOUS
  Filled 2014-11-23 (×3): qty 0.4

## 2014-11-23 MED ORDER — ONDANSETRON HCL 4 MG PO TABS: 4.0000 mg | ORAL_TABLET | Freq: Four times a day (QID) | ORAL | Status: DC | PRN

## 2014-11-23 MED ORDER — FLUOXETINE HCL 20 MG PO CAPS
80.0000 mg | ORAL_CAPSULE | Freq: Every morning | ORAL | Status: DC
  Administered 2014-11-23 – 2014-11-25 (×3): 80 mg via ORAL
  Filled 2014-11-23 (×3): qty 4

## 2014-11-23 MED ORDER — SODIUM CHLORIDE 0.9 % IV SOLN
3.0000 g | Freq: Three times a day (TID) | INTRAVENOUS | Status: DC
Start: 2014-11-23 — End: 2014-11-25
  Administered 2014-11-23 – 2014-11-25 (×7): 3 g via INTRAVENOUS
  Filled 2014-11-23 (×10): qty 3

## 2014-11-23 MED ORDER — WARFARIN - PHARMACIST DOSING INPATIENT
Freq: Every day | Status: DC
  Administered 2014-11-23: 18:00:00

## 2014-11-23 MED ORDER — ONDANSETRON HCL 4 MG/2ML IJ SOLN: 4.0000 mg | Freq: Four times a day (QID) | INTRAMUSCULAR | Status: DC | PRN

## 2014-11-23 MED ORDER — INSULIN ASPART 100 UNIT/ML ~~LOC~~ SOLN
16.0000 [IU] | Freq: Once | SUBCUTANEOUS | Status: AC
  Administered 2014-11-23: 16 [IU] via SUBCUTANEOUS
  Filled 2014-11-23: qty 16

## 2014-11-23 MED ORDER — TETANUS-DIPHTH-ACELL PERTUSSIS 5-2.5-18.5 LF-MCG/0.5 IM SUSP
0.5000 mL | Freq: Once | INTRAMUSCULAR | Status: AC
  Administered 2014-11-23: 0.5 mL via INTRAMUSCULAR
  Filled 2014-11-23: qty 0.5

## 2014-11-23 MED ORDER — METOPROLOL SUCCINATE ER 50 MG PO TB24
50.0000 mg | ORAL_TABLET | Freq: Every day | ORAL | Status: DC
  Administered 2014-11-24 – 2014-11-25 (×2): 50 mg via ORAL
  Filled 2014-11-23 (×2): qty 1

## 2014-11-23 MED ORDER — LOSARTAN POTASSIUM 50 MG PO TABS
50.0000 mg | ORAL_TABLET | Freq: Every morning | ORAL | Status: DC
  Administered 2014-11-23 – 2014-11-25 (×3): 50 mg via ORAL
  Filled 2014-11-23 (×3): qty 1

## 2014-11-23 MED ORDER — METRONIDAZOLE IN NACL 5-0.79 MG/ML-% IV SOLN
500.0000 mg | Freq: Three times a day (TID) | INTRAVENOUS | Status: DC
  Administered 2014-11-23: 500 mg via INTRAVENOUS
  Filled 2014-11-23 (×5): qty 100

## 2014-11-23 MED ORDER — ATORVASTATIN CALCIUM 20 MG PO TABS
40.0000 mg | ORAL_TABLET | Freq: Every evening | ORAL | Status: DC
  Administered 2014-11-23 – 2014-11-24 (×2): 40 mg via ORAL
  Filled 2014-11-23 (×3): qty 2

## 2014-11-23 MED ORDER — CEFTRIAXONE SODIUM IN DEXTROSE 20 MG/ML IV SOLN: 1.0000 g | INTRAVENOUS | Status: DC

## 2014-11-23 MED ORDER — INSULIN GLARGINE 100 UNIT/ML ~~LOC~~ SOLN
15.0000 [IU] | Freq: Every day | SUBCUTANEOUS | Status: DC
  Administered 2014-11-23 – 2014-11-24 (×2): 15 [IU] via SUBCUTANEOUS
  Filled 2014-11-23 (×3): qty 0.15

## 2014-11-23 MED ORDER — LORAZEPAM 0.5 MG PO TABS: 0.5000 mg | ORAL_TABLET | Freq: Every day | ORAL | Status: DC | PRN

## 2014-11-23 MED ORDER — DOCUSATE SODIUM 100 MG PO CAPS
100.0000 mg | ORAL_CAPSULE | Freq: Two times a day (BID) | ORAL | Status: DC
  Administered 2014-11-23 – 2014-11-25 (×6): 100 mg via ORAL
  Filled 2014-11-23 (×6): qty 1

## 2014-11-23 MED ORDER — SPIRONOLACTONE 25 MG PO TABS
25.0000 mg | ORAL_TABLET | Freq: Every day | ORAL | Status: DC
Start: 2014-11-23 — End: 2014-11-23
  Filled 2014-11-23: qty 1

## 2014-11-23 MED ORDER — BUPROPION HCL ER (XL) 150 MG PO TB24
150.0000 mg | ORAL_TABLET | Freq: Every evening | ORAL | Status: DC
  Administered 2014-11-23 – 2014-11-24 (×2): 150 mg via ORAL
  Filled 2014-11-23 (×4): qty 1

## 2014-11-23 MED ORDER — METOPROLOL SUCCINATE ER 100 MG PO TB24
100.0000 mg | ORAL_TABLET | Freq: Every day | ORAL | Status: DC
  Filled 2014-11-23: qty 1

## 2014-11-23 MED ORDER — OXYCODONE HCL 5 MG PO TABS
5.0000 mg | ORAL_TABLET | ORAL | Status: DC | PRN
  Administered 2014-11-25: 5 mg via ORAL
  Filled 2014-11-23: qty 1

## 2014-11-23 MED ORDER — ALUM & MAG HYDROXIDE-SIMETH 200-200-20 MG/5ML PO SUSP: 30.0000 mL | Freq: Four times a day (QID) | ORAL | Status: DC | PRN

## 2014-11-23 MED ORDER — INSULIN ASPART 100 UNIT/ML ~~LOC~~ SOLN
0.0000 [IU] | Freq: Every day | SUBCUTANEOUS | Status: DC
  Administered 2014-11-23: 4 [IU] via SUBCUTANEOUS
  Administered 2014-11-24: 3 [IU] via SUBCUTANEOUS
  Filled 2014-11-23: qty 4
  Filled 2014-11-23: qty 3

## 2014-11-23 MED ORDER — LINAGLIPTIN 5 MG PO TABS
5.0000 mg | ORAL_TABLET | Freq: Every day | ORAL | Status: DC
  Filled 2014-11-23: qty 1

## 2014-11-23 MED ORDER — INSULIN ASPART 100 UNIT/ML ~~LOC~~ SOLN: 0.0000 [IU] | Freq: Three times a day (TID) | SUBCUTANEOUS | Status: DC

## 2014-11-23 MED ORDER — POLYETHYLENE GLYCOL 3350 17 G PO PACK: 17.0000 g | PACK | Freq: Every day | ORAL | Status: DC | PRN

## 2014-11-23 MED ORDER — DEXTROSE 5 % IV SOLN
1.0000 g | INTRAVENOUS | Status: DC
  Administered 2014-11-23: 1 g via INTRAVENOUS
  Filled 2014-11-23 (×2): qty 10

## 2014-11-23 MED ORDER — FUROSEMIDE 40 MG PO TABS
40.0000 mg | ORAL_TABLET | Freq: Every morning | ORAL | Status: DC
  Filled 2014-11-23: qty 1

## 2014-11-23 MED ORDER — MORPHINE SULFATE 4 MG/ML IJ SOLN: 4.0000 mg | INTRAMUSCULAR | Status: DC | PRN

## 2014-11-23 MED ORDER — ACETAMINOPHEN 650 MG RE SUPP: 650.0000 mg | Freq: Four times a day (QID) | RECTAL | Status: DC | PRN

## 2014-11-23 MED ORDER — ACETAMINOPHEN 325 MG PO TABS: 650.0000 mg | ORAL_TABLET | Freq: Four times a day (QID) | ORAL | Status: DC | PRN

## 2014-11-23 MED ORDER — SODIUM CHLORIDE 0.9 % IV SOLN
INTRAVENOUS | Status: DC
  Administered 2014-11-23 (×2): via INTRAVENOUS

## 2014-11-23 NOTE — Consult Note (Addendum)
ORTHOPAEDIC CONSULTATION  REQUESTING PHYSICIAN: Nicholes Mango, MD  Chief Complaint: Pain, right forearm from cat Bite injuries  HPI: Desiree Mccall is a 64 y.o. female who complains of  right forearm pain and swelling secondary to cat Bite 2 days ago.   She has had a CAT for 2 weeks and doesn't know much history about it.  Patient is diabetic and is also on Coumadin for previous heart attack and aortic dissection and is also on Plavix.  Her sugars have been elevated.  She has not had any fever or chills. she presented to the ER this evening for evaluation and treatment.  Her white blood count is 11,500.  Her highest temperature here has been 54F    Past Medical History  Diagnosis Date  . Aortic dissection 2010  . Heart attack sept. 25, 2010  . Diabetes mellitus without complication   . CHF (congestive heart failure)   . [name]    Past Surgical History  Procedure Laterality Date  . Cholecystectomy    . Cardiac surgery    . Abdominal hysterectomy    . Carotid stent  2010  . Stent place left ureter ([facility] hx)     History   Social History  . Marital Status: Divorced    Spouse Name: N/A  . Number of Children: N/A  . Years of Education: N/A   Social History Main Topics  . Smoking status: Never Smoker   . Smokeless tobacco: Not on file  . Alcohol Use: No  . Drug Use: No  . Sexual Activity: Not on file   Other Topics Concern  . None   Social History Narrative  . None   Family History  Problem Relation Age of Onset  . Heart failure Mother   . Heart failure Father    [name] Prior to Admission medications   Medication Sig Start Date End Date Taking? Authorizing Provider  ACCU-CHEK AVIVA PLUS test strip Inject 1 strip into the skin daily. Check blood sugars 11/20/14  Yes Historical Provider, MD  ACCU-CHEK SOFTCLIX LANCETS lancets 1 each by Other route daily. To check blood sugars 11/20/14  Yes Historical Provider, MD  atorvastatin (LIPITOR) 40 MG  tablet Take 1 tablet by mouth every evening. 05/08/14 05/08/15 Yes Historical Provider, MD  Blood Glucose Monitoring Suppl (ACCU-CHEK AVIVA PLUS) W/DEVICE KIT Inject 1 Device into the skin daily. 11/20/14  Yes Historical Provider, MD  buPROPion (WELLBUTRIN XL) 150 MG 24 hr tablet Take 1 tablet by mouth every evening.   Yes Historical Provider, MD  clopidogrel (PLAVIX) 75 MG tablet Take 1 tablet by mouth every morning. 04/01/14  Yes Historical Provider, MD  FLUoxetine (PROZAC) 40 MG capsule Take 2 capsules by mouth every morning.   Yes Historical Provider, MD  furosemide (LASIX) 40 MG tablet Take 1 tablet by mouth every morning. 09/25/13  Yes Historical Provider, MD  LORazepam (ATIVAN) 0.5 MG tablet Take 1 tablet by mouth daily as needed.   Yes Historical Provider, MD  losartan (COZAAR) 50 MG tablet Take 1 tablet by mouth every morning. 05/08/14 05/08/15 Yes Historical Provider, MD  metFORMIN (GLUCOPHAGE) 1000 MG tablet Take 2 tablets by mouth 2 (two) times daily. 11/20/14  Yes Historical Provider, MD  metoprolol succinate (TOPROL-XL) 100 MG 24 hr tablet Take 1 tablet by mouth daily. 06/23/14 06/23/15 Yes Historical Provider, MD  spironolactone (ALDACTONE) 25 MG tablet Take 1 tablet by mouth daily. 05/16/14  Yes Historical Provider, MD  TRADJENTA 5 MG TABS  tablet Take 1 tablet by mouth daily. 11/21/14  Yes Historical Provider, MD  warfarin (COUMADIN) 7.5 MG tablet Take 1 tablet by mouth daily. TK 1 TAB PO MON, WEDS, FRI. 0.5 TAB TUES, THURS, SAT AND SUN. 07/08/10  Yes Historical Provider, MD   No results found.  Positive ROS: All other systems have been reviewed and were otherwise negative with the exception of those mentioned in the HPI and as above.  Physical Exam: General: Alert, no acute distress Cardiovascular: No pedal edema Respiratory: No cyanosis, no use of accessory musculature GI: No organomegaly, abdomen is soft and non-tender Skin: No lesions in the area of chief complaint Neurologic: Sensation  intact distally Psychiatric: Patient is competent for consent with normal mood and affect Lymphatic: No axillary or cervical lymphadenopathy  MUSCULOSKELETAL:  patient is alert oriented and somewhat agitated.  She responds an exaggerated fashion to even the lightest touch.  There are multiple small puncture wounds on the dorsum and volar aspect of the distal forearm.  There is mild redness and minimal warmth.  Her doral  extensor and volar flexor muscle compartments are soft.  Hand and fingers have no swelling whatsoever.  Passive motion of the fingers in flexion and extension produces pain at the MP levels of the fingers only which is nonanatomic.  There is minimal pain in the forearm with this.   Assessment: cellulitis right forearm secondary to  cat bite injuries.  I do not believe there is any significant tenosynovitis or compartment syndrome at this time.  I do not believe that operative intervention is indicated at this time.  The ER has not drawn a ProTime and we do not know what her Coumadin levels are as well.    Plan: The patient will be admitted to the hospitalist service for IV antibiotics.  We will apply a volar splint to the hand and forearm.  ProTime will be evaluated.  Coumadin and Plavix  should be held until tomorrow after reevaluation.      Park Breed, MD [phone]   11/23/2014 12:02 AM

## 2014-11-23 NOTE — Progress Notes (Signed)
Subjective:        Patient reports pain as mild. Much less than last night.  Less pain with movement.  Splint felt good.    Objective:   VITALS:   Filed Vitals:   11/23/14 1023  BP: 118/51  Pulse: 69  Temp:   Resp:     Neurologically intact Neurovascular intact Sensation intact distally Intact pulses distally Compartment soft  Much less reddness and swelling. No warmth.  ROM of fingers painless. No evidence for tenosynovitis or compartment syndrome.   LABS  Recent Labs  11/22/14 1948 11/23/14 0627  HGB 12.9 11.3*  HCT 39.4 34.4*  WBC 11.5* 11.6*  PLT 228 193     Recent Labs  11/22/14 1948 11/23/14 0627  NA 126* 134*  K 4.0 4.1  BUN 18 14  CREATININE 1.01* 0.81  GLUCOSE 534* 443*     Recent Labs  11/22/14 1948 11/23/14 0627  [name] 1.69 1.74     Assessment/Plan:     Continue antibiotics. K Pad to arm Home after switch to oral antibiotics.

## 2014-11-23 NOTE — Progress Notes (Signed)
ANTICOAGULATION CONSULT NOTE - [name]  Pharmacy Consult for WARFARIN Indication: Afib  [name]  Patient Measurements: Height:  [name]: 250 lb 12.8 oz (113.762 kg) [name] (Calculated) : 61.6   Vital Signs: Temp: 98.4 F (36.9 C) (07/24 0751) Temp Source: Oral (07/24 0751) BP: 118/51 mmHg (07/24 1023) Pulse Rate: 69 (07/24 1023)  Labs:  Recent Labs  11/22/14 1948 11/23/14 0627  HGB 12.9 11.3*  HCT 39.4 34.4*  PLT 228 193  [name] 20.1* 20.5*  [name] 1.69 1.74  CREATININE 1.01* 0.81    Estimated Creatinine Clearance: 95 mL/min (by C-G formula based on Cr of 0.81).   Medical History: Past Medical History  Diagnosis Date  . Aortic dissection 2010  . Heart attack sept. 25, 2010  . Diabetes mellitus without complication   . CHF (congestive heart failure)   . [name]     Medications:  Scheduled:  . ampicillin-sulbactam (UNASYN) IV  3 g Intravenous Q8H  . atorvastatin  40 mg Oral QPM  . buPROPion  150 mg Oral QPM  . docusate sodium  100 mg Oral BID  . enoxaparin (LOVENOX) injection  40 mg Subcutaneous Q24H  . FLUoxetine  80 mg Oral q morning - 10a  . insulin aspart  0-9 Units Subcutaneous TID WC  . insulin glargine  15 Units Subcutaneous Daily  . losartan  50 mg Oral q morning - 10a  . [START ON 11/24/2014] metoprolol succinate  50 mg Oral Daily    Assessment: 64 yo female admitted with cat bite, cellulits. Patient is on Warfarin at home for AFib per MD note. Of note, pt was on ceftriaxone and metronidazole changed to Unasyn today.   Confirmed home dose with pt: Warfarin 7.5 mg Mon,Wed, Fri and Warfarin 3.75 mg (1/2 of 7.5mg  tab) on Tue,Thur,Sat,Sun. Last dose taken day before last (Friday), 7.5 mg.   Per [name], ok to resume warfarin today; likely will not need surgery.   7/23 [name] 1.69 - no warfarin given due to possible surgery 7/24 [name] 1.74 - [name] subtherapeutic, no dose given yesterday.   Goal of  Therapy:  [name] 2-3 Monitor platelets by anticoagulation protocol: Yes    Plan:  Will order warfarin 7.5 mg PO x1 for today [name] in AM   Order for enoxaparin noted; [name] subtherapeutic.  Will continue to follow.    [name], PharmD, BCPS Clinical Pharmacist  11/23/2014 11:43 AM

## 2014-11-23 NOTE — Progress Notes (Signed)
ANTICOAGULATION CONSULT NOTE - Initial Consult  Pharmacy Consult for WARFARIN Indication: VTE prophylaxis/CHF?  [name]  Patient Measurements: Weight: 244 lb (110.678 kg) Heparin Dosing Weight:  Vital Signs: Temp: 98.3 F (36.8 C) (07/23 2349) Temp Source: Oral (07/23 2349) BP: 126/57 mmHg (07/23 2349) Pulse Rate: 77 (07/23 2349)  Labs:  Recent Labs  11/22/14 1948  HGB 12.9  HCT 39.4  PLT 228  [name] 20.1*  [name] 1.69  CREATININE 1.01*    CrCl cannot be calculated (Unknown ideal weight.).   Medical History: Past Medical History  Diagnosis Date  . Aortic dissection 2010  . Heart attack sept. 25, 2010  . Diabetes mellitus without complication   . CHF (congestive heart failure)   . [name]     Medications:  Scheduled:  . insulin aspart  0-9 Units Subcutaneous TID WC    Assessment: 64 yo female admitted with cat bite, cellulits and patient is on Warfarin at home(indication not clear). Home dose listed as Warfarin 7.5 mg Mon,Wed, Fri and Warfarin 3.75 mg (1/2 of 7.5mg  tab) on Tue,Thur,Sat,Sun. *Patient on Metronidazole and Ceftriaxone.  Goal of Therapy:  [name] 2-3 Monitor platelets by anticoagulation protocol: Yes  7/23 [name]= 1.69   Plan:  Per MD note : hold warfarin as patient may need surgery in am of 7/24. Will not order any warfarin at this time. MD has ordered Enoxaparin.  Will continue to follow.  Bari Mantis PharmD Clinical Pharmacist 11/23/2014

## 2014-11-23 NOTE — Care Management Note (Signed)
Case Management Note  Patient Details  Name: Desiree Mccall MRN: [id number] Date of Birth: Oct 24, 1950  Subjective/Objective:           63yo Ms Desiree Mccall was admitted 11/23/14 with cat bite cellulitis of her right forearm. Rt arm is currently in a splint. Received a Rabies vaccination in the ED.  PCP=Dr Darreld Mclean. Hx of diabetes and CHF. WBC=11.6 today. Currently receiving IV Rocephin and Flagyl. Anticipate discharge home with PO antibiotics within 2-3 days.         Action/Plan:   Expected Discharge Date:                  Expected Discharge Plan:     In-House Referral:     Discharge planning Services     Post Acute Care Choice:    Choice offered to:     DME Arranged:    DME Agency:     HH Arranged:    HH Agency:     Status of Service:     Medicare Important Message Given:    Date Medicare IM Given:    Medicare IM give by:    Date Additional Medicare IM Given:    Additional Medicare Important Message give by:     If discussed at Long Length of Stay Meetings, dates discussed:    Additional Comments:  Desiree Mccall A, RN 11/23/2014, 4:42 PM

## 2014-11-23 NOTE — Progress Notes (Signed)
Patient ID: Desiree Mccall, female   DOB: 07-18-1950, 64 y.o.   MRN: [id number] [facility] PROGRESS NOTE  PCP: Leanna Sato, MD  HPI/Subjective: Patient took in a stray cat that normally is been very friendly. The cat did not want to come into the house and ended up biting her right arm twice. The patient has lots of pain when moving the arm. Since she was put in the brace she's felt much better. Once I took off the brace she is unable to bend at the wrist without pain.  Objective: Filed Vitals:   11/23/14 1023  BP: 118/51  Pulse: 69  Temp:   Resp:     Intake/Output Summary (Last 24 hours) at 11/23/14 1031 Last data filed at 11/23/14 0530  Gross per 24 hour  Intake      0 ml  Output   1500 ml  Net  -1500 ml   [facility] Weights   11/22/14 2133 11/23/14 0214  Weight: 110.678 kg (244 lb) 113.762 kg (250 lb 12.8 oz)    ROS: Review of Systems  Constitutional: Negative for fever and chills.  Eyes: Negative for blurred vision.  Respiratory: Negative for cough and shortness of breath.   Cardiovascular: Negative for chest pain.  Gastrointestinal: Negative for nausea, vomiting, abdominal pain, diarrhea and constipation.  Genitourinary: Negative for dysuria.  Musculoskeletal: Positive for joint pain.  Neurological: Negative for dizziness and headaches.   Exam: Physical Exam  HENT:  Nose: No mucosal edema.  Mouth/Throat: No oropharyngeal exudate or posterior oropharyngeal edema.  Eyes: Conjunctivae, EOM and lids are normal. Pupils are equal, round, and reactive to light.  Neck: No JVD present. Carotid bruit is not present. No edema present. No thyroid mass and no thyromegaly present.  Cardiovascular: S1 normal and S2 normal.  Exam reveals no gallop.   No murmur heard. Pulses:      Dorsalis pedis pulses are 2+ on the right side, and 2+ on the left side.  Respiratory: No respiratory distress. She has no wheezes. She has no rhonchi. She has no rales.  GI: Soft. Bowel  sounds are normal. There is no tenderness.  Musculoskeletal:       Right wrist: She exhibits decreased range of motion, tenderness and swelling.       Right ankle: She exhibits no swelling.       Left ankle: She exhibits no swelling.  Pain on bending right wrist and limited motion secondary to swelling of the forearm.  Lymphadenopathy:    She has no cervical adenopathy.  Neurological: She is alert. No cranial nerve deficit.  Skin: Skin is warm. Nails show no clubbing.  Right forearm 2 sets of bites with scab. Some erythema on the right arm and warmth. Swelling of the right forearm.  Psychiatric: She has a normal mood and affect.    Data Reviewed: Basic Metabolic Panel:  Recent Labs Lab 11/22/14 1948 11/23/14 0627  NA 126* 134*  K 4.0 4.1  CL 91* 101  CO2 22 21*  GLUCOSE 534* 443*  BUN 18 14  CREATININE 1.01* 0.81  CALCIUM 8.8* 8.4*   Liver Function Tests:  Recent Labs Lab 11/23/14 0627  AST 20  ALT 21  ALKPHOS 108  BILITOT 1.2  PROT 6.3*  ALBUMIN 3.1*   CBC:  Recent Labs Lab 11/22/14 1948 11/23/14 0627  WBC 11.5* 11.6*  NEUTROABS 8.5*  --   HGB 12.9 11.3*  HCT 39.4 34.4*  MCV 86.4 86.9  PLT 228 193  CBG:  Recent Labs Lab 11/22/14 2227 11/23/14 0855  GLUCAP 369* 415*    Scheduled Meds: . ampicillin-sulbactam (UNASYN) IV  3 g Intravenous Q8H  . atorvastatin  40 mg Oral QPM  . buPROPion  150 mg Oral QPM  . docusate sodium  100 mg Oral BID  . enoxaparin (LOVENOX) injection  40 mg Subcutaneous Q24H  . FLUoxetine  80 mg Oral q morning - 10a  . insulin aspart  0-9 Units Subcutaneous TID WC  . insulin glargine  15 Units Subcutaneous Daily  . losartan  50 mg Oral q morning - 10a  . [START ON 11/24/2014] metoprolol succinate  50 mg Oral Daily   Continuous Infusions: . sodium chloride 125 mL/hr at 11/23/14 0322    Assessment/Plan:  1. Cat bite cellulitis- change antibiotics to IV Unasyn. Follow-up cultures. The patient is unable to move her wrist  much with the swelling in her forearm. Pain with movement of the wrist. She feels better in the splint. Orthopedic follow-up. I asked the nurse to replace the splint. 2. Relative hypotension- need to take blood pressure in the right arm with her prior vascular surgery. Will hold spironolactone and Lasix. Decrease dose of metoprolol and continue Cozaar. Repeat blood pressure in the right arm was better. 3. Diabetes with hyperglycemia- start Lantus 15 units daily. Continue sliding scale. Check a hemoglobin A1c. The patient did not start gentle as outpatient yet will stop that medication. 4. Hyperlipidemia unspecified- on atorvastatin. 5. Hyponatremia improved. 6. History coronary artery disease, peripheral vascular disease and congestive heart failure. Currently no signs of heart failure. Decrease rate of IV fluid. 7. History of atrial fibrillation on Coumadin- pharmacy to dose.  Code Status:     Code Status Orders        Start     Ordered   11/23/14 0218  Full code   Continuous     11/23/14 0217      Disposition Plan: Home once swelling of the arm decreases and patient has better range of motion with her right wrist.  Consultants:  Orthopedic surgery  Antibiotics:  IV Unasyn   Time spent: 30 minutes  Alford Highland  [facility]

## 2014-11-23 NOTE — H&P (Signed)
[facility] NAME: Desiree Mccall    MR#:  [id number]  DATE OF BIRTH:  01/26/1951  DATE OF ADMISSION:  11/22/2014  PRIMARY CARE PHYSICIAN: Marguerita Merles, MD   REQUESTING/REFERRING PHYSICIAN: dr.Quale  CHIEF COMPLAINT:   Cat bite HISTORY OF PRESENT ILLNESS:  Desiree Mccall  is a 64 y.o. female with a known history of diabetes mellitus, congestive heart failure and other medical problems is presenting to the ED with a chief complaint of right forearm swelling and pain following a cat bite. Patient has adopted and street cat approximately 3 weeks ago and had a cat bite on Thursday morning that was 2 days ago at 8 AM. Patient has applied some ice packs and hydrogen peroxide with no improvement. She started noticing swelling, pain and redness since yesterday. Patient was having excruciating pain and was unable to move her forearm today which prompted her to come into the ED. Patient was given IV clindamycin in the emergency department and hospitalist team is called to admit the patient. Her white count is elevated at 11.5. She was given rabies vaccine in the ED.  PAST MEDICAL HISTORY:   Past Medical History  Diagnosis Date  . Aortic dissection 2010  . Heart attack sept. 25, 2010  . Diabetes mellitus without complication   . CHF (congestive heart failure)   . [name]     PAST SURGICAL HISTOIRY:   Past Surgical History  Procedure Laterality Date  . Cholecystectomy    . Cardiac surgery    . Abdominal hysterectomy    . Carotid stent  2010  . Stent place left ureter ([facility] hx)      SOCIAL HISTORY:   History  Substance Use Topics  . Smoking status: Never Smoker   . Smokeless tobacco: Not on file  . Alcohol Use: No   lives alone  FAMILY HISTORY:   Family History  Problem Relation Age of Onset  . Heart failure Mother   . Heart failure Father     DRUG ALLERGIES:  [name]  REVIEW OF SYSTEMS:   CONSTITUTIONAL: No fever, fatigue or weakness.  EYES: No blurred or double vision.  EARS, NOSE, AND THROAT: No tinnitus or ear pain.  RESPIRATORY: No cough, shortness of breath, wheezing or hemoptysis.  CARDIOVASCULAR: No chest pain, orthopnea, edema.  GASTROINTESTINAL: No nausea, vomiting, diarrhea or abdominal pain.  GENITOURINARY: No dysuria, hematuria.  ENDOCRINE: No polyuria, nocturia,  HEMATOLOGY: No anemia, easy bruising or bleeding SKIN: No rash or lesion. MUSCULOSKELETAL: No joint pain or arthritis.  Reporting right upper extremity swelling, redness and pain NEUROLOGIC: No tingling, numbness, weakness.  PSYCHIATRY: No anxiety or depression.   MEDICATIONS AT HOME:   Prior to Admission medications   Medication Sig Start Date End Date Taking? Authorizing Provider  ACCU-CHEK AVIVA PLUS test strip Inject 1 strip into the skin daily. Check blood sugars 11/20/14  Yes Historical Provider, MD  ACCU-CHEK SOFTCLIX LANCETS lancets 1 each by Other route daily. To check blood sugars 11/20/14  Yes Historical Provider, MD  atorvastatin (LIPITOR) 40 MG tablet Take 1 tablet by mouth every evening. 05/08/14 05/08/15 Yes Historical Provider, MD  Blood Glucose Monitoring Suppl (ACCU-CHEK AVIVA PLUS) W/DEVICE KIT Inject 1 Device into the skin daily. 11/20/14  Yes Historical Provider, MD  buPROPion (WELLBUTRIN XL) 150 MG 24 hr tablet Take 1 tablet by mouth every evening.   Yes Historical Provider, MD  clopidogrel (PLAVIX) 75 MG tablet Take  1 tablet by mouth every morning. 04/01/14  Yes Historical Provider, MD  FLUoxetine (PROZAC) 40 MG capsule Take 2 capsules by mouth every morning.   Yes Historical Provider, MD  furosemide (LASIX) 40 MG tablet Take 1 tablet by mouth every morning. 09/25/13  Yes Historical Provider, MD  LORazepam (ATIVAN) 0.5 MG tablet Take 1 tablet by mouth daily as needed.   Yes Historical Provider, MD  losartan (COZAAR) 50 MG tablet Take 1 tablet by mouth every morning. 05/08/14 05/08/15 Yes  Historical Provider, MD  metFORMIN (GLUCOPHAGE) 1000 MG tablet Take 2 tablets by mouth 2 (two) times daily. 11/20/14  Yes Historical Provider, MD  metoprolol succinate (TOPROL-XL) 100 MG 24 hr tablet Take 1 tablet by mouth daily. 06/23/14 06/23/15 Yes Historical Provider, MD  spironolactone (ALDACTONE) 25 MG tablet Take 1 tablet by mouth daily. 05/16/14  Yes Historical Provider, MD  TRADJENTA 5 MG TABS tablet Take 1 tablet by mouth daily. 11/21/14  Yes Historical Provider, MD  warfarin (COUMADIN) 7.5 MG tablet Take 1 tablet by mouth daily. TK 1 TAB PO MON, WEDS, FRI. 0.5 TAB TUES, THURS, SAT AND SUN. 07/08/10  Yes Historical Provider, MD      VITAL SIGNS:  Blood pressure 126/57, pulse 77, temperature 98.3 F (36.8 C), temperature source Oral, resp. rate 18, weight 110.678 kg (244 lb), SpO2 98 %.  PHYSICAL EXAMINATION:  GENERAL:  64 y.o.-year-old patient lying in the bed with no acute distress.  EYES: Pupils equal, round, reactive to light and accommodation. No scleral icterus. Extraocular muscles intact.  HEENT: Head atraumatic, normocephalic. Oropharynx and nasopharynx clear.  NECK:  Supple, no jugular venous distention. No thyroid enlargement, no tenderness.  LUNGS: Normal breath sounds bilaterally, no wheezing, rales,rhonchi or crepitation. No use of accessory muscles of respiration.  CARDIOVASCULAR: S1, S2 normal. No murmurs, rubs, or gallops.  ABDOMEN: Soft, nontender, nondistended. Bowel sounds present. No organomegaly or mass.  EXTREMITIES: Right upper extremity forearm is extremely tender, edematous and erythematous with scabbed animal bite on the dorsal aspect of the forearm. Radial pulse 2+, sensory intact. No pedal edema, cyanosis, or clubbing.  NEUROLOGIC: Cranial nerves II through XII are intact. Muscle strength 5/5 in all extremities. Sensation intact. Gait not checked.  PSYCHIATRIC: The patient is alert and oriented x 3.  SKIN: No obvious rash, lesion, or ulcer.   LABORATORY PANEL:    CBC  Recent Labs Lab 11/22/14 1948  WBC 11.5*  HGB 12.9  HCT 39.4  PLT 228   ------------------------------------------------------------------------------------------------------------------  Chemistries   Recent Labs Lab 11/22/14 1948  NA 126*  K 4.0  CL 91*  CO2 22  GLUCOSE 534*  BUN 18  CREATININE 1.01*  CALCIUM 8.8*   ------------------------------------------------------------------------------------------------------------------  Cardiac Enzymes No results for input(s): TROPONINI in the last 168 hours. ------------------------------------------------------------------------------------------------------------------  RADIOLOGY:  No results found.  EKG:   Orders placed or performed in visit on 02/28/11  . EKG 12-Lead    IMPRESSION AND PLAN:   Desiree Mccall  is a 64 y.o. female with a known history of diabetes mellitus, congestive heart failure and other medical problems is presenting to the ED with a chief complaint of right forearm swelling and pain following a cat bite. Patient has adopted and street cat approximately 3 weeks ago and had a cat bite on Thursday morning that was 2 days ago at 8 AM  1. Right upper extremity swelling with cellulitis following a cat bite- neurovascular is intact Admit the patient to MedSurg unit Provide her IV antibiotics  Rocephin and Flagyl for gram-negative as well as anaerobic coverage Blood cultures and wound cultures were ordered  pain management as needed Patient was evaluated by on-call surgery Dr. Felton Clinton who has recommended for evaluation as there was a concern for tenosynovitis. Patient was seen by Dr. Sabra Heck, who is recommending splint and continuation of the IV antibiotics. He'll follow the patient in a.m. Rabies vaccine was given in the ED. Will provide Tdap  Patient is on Coumadin, [name] is subtherapeutic at this time. Hold Coumadin and Plavix as patient might need surgery in a.m.  2. History of diabetes mellitus  with hyperglycemia With IV fluid bolus and hydration with IV fluids sugars are trending down Monitor Accu-Cheks Sliding scale insulin Diabetic diet   3. Chronic history of congestive heart failure. Currently not fluid overloaded. Monitor daily weights. Continue her home medications Lasix, Cozaar, metoprolol and Aldactone  4. Past medical history of aortic dissection  5. Pseudohyponatremia from hyperglycemia Check BMP in a.m. Provide IV fluids    All the records are reviewed and case discussed with ED provider. Management plans discussed with the patient, family and they are in agreement.  CODE STATUS:Full code  TOTAL TIME TAKING CARE OF THIS PATIENT, reviewing medical records, history and physical, admission orders and coordination of care, discussing with Dr. Felton Clinton and Dr. Sabra Heck: 50 minutes.    Nicholes Mango M.D on 11/23/2014 at 12:05 AM  Between 7am to 6pm - Pager - [phone]  After 6pm go to [email or web address] - password [email or web address] [facility]  Office  [phone]  CC: Primary care physician; Marguerita Merles, MD

## 2014-11-24 LAB — GLUCOSE, CAPILLARY
Glucose-Capillary: 269 mg/dL — ABNORMAL HIGH (ref 65–99)
Glucose-Capillary: 387 mg/dL — ABNORMAL HIGH (ref 65–99)
Glucose-Capillary: 230 mg/dL — ABNORMAL HIGH (ref 65–99)
Glucose-Capillary: 253 mg/dL — ABNORMAL HIGH (ref 65–99)

## 2014-11-24 LAB — PROTIME-INR
INR: 1.56
PROTHROMBIN TIME: 18.9 s — AB (ref 11.4–15.0)

## 2014-11-24 MED ORDER — WARFARIN SODIUM 7.5 MG PO TABS
7.5000 mg | ORAL_TABLET | Freq: Once | ORAL | Status: AC
  Administered 2014-11-24: 7.5 mg via ORAL
  Filled 2014-11-24: qty 1

## 2014-11-24 MED ORDER — INSULIN ASPART 100 UNIT/ML ~~LOC~~ SOLN
5.0000 [IU] | Freq: Three times a day (TID) | SUBCUTANEOUS | Status: DC
  Administered 2014-11-24 (×2): 5 [IU] via SUBCUTANEOUS
  Filled 2014-11-24 (×2): qty 5

## 2014-11-24 MED ORDER — INSULIN GLARGINE 100 UNIT/ML ~~LOC~~ SOLN
20.0000 [IU] | Freq: Every day | SUBCUTANEOUS | Status: DC
  Filled 2014-11-24 (×2): qty 0.2

## 2014-11-24 MED ORDER — INSULIN STARTER KIT- PEN NEEDLES (ENGLISH)
1.0000 | Freq: Once | Status: AC
  Administered 2014-11-25: 1
  Filled 2014-11-24: qty 1

## 2014-11-24 MED ORDER — FUROSEMIDE 40 MG PO TABS
40.0000 mg | ORAL_TABLET | Freq: Every day | ORAL | Status: DC
  Administered 2014-11-25: 40 mg via ORAL
  Filled 2014-11-24: qty 1

## 2014-11-24 MED ORDER — SPIRONOLACTONE 25 MG PO TABS
25.0000 mg | ORAL_TABLET | Freq: Every day | ORAL | Status: DC
  Administered 2014-11-25: 25 mg via ORAL
  Filled 2014-11-24: qty 1

## 2014-11-24 MED ORDER — LIVING WELL WITH DIABETES BOOK
Freq: Once | Status: AC
  Administered 2014-11-24: 13:00:00
  Filled 2014-11-24: qty 1

## 2014-11-24 NOTE — Care Management Note (Addendum)
Case Management Note  Patient Details  Name: Desiree Mccall MRN: [id number] Date of Birth: [birth year]  Subjective/Objective:                    Action/Plan:  Spoke with patient concerning discharge plan. Patient is from home alone and has family in the area. Independent drives self. Stated can afford all medications and is insured. No DME, No O2. Will not need IVABX per attending. PCP is Dr Marvis Moeller. No discharge CM needs identified.   Expected Discharge Date:                  Expected Discharge Plan:  Home/Self Care  In-House Referral:  NA  Discharge planning Services  CM Consult  Post Acute Care Choice:  NA Choice offered to:  NA  DME Arranged:    DME Agency:     HH Arranged:    HH Agency:  NA  Status of Service:  Completed, signed off  Medicare Important Message Given:  Yes-second notification given Date Medicare IM Given:    Medicare IM give by:    Date Additional Medicare IM Given:    Additional Medicare Important Message give by:     If discussed at Long Length of Stay Meetings, dates discussed:    Additional Comments:  Adonis Huguenin, RN 11/24/2014, 11:11 AM

## 2014-11-24 NOTE — Care Management Important Message (Signed)
Important Message  Patient Details  Name: Desiree Mccall MRN: [id number] Date of Birth: 1950/12/01   Medicare Important Message Given:  Yes-second notification given    [name] 11/24/2014, 10:25 AM

## 2014-11-24 NOTE — Progress Notes (Signed)
Spoke with patient about diabetes and home regimen for diabetes control.  Patient reports that she was diagnosed with DM less than one year ago and is followed by her PCP for diabetes management. Patient states that she was taking Metformin 500 mg BID up until this past Thursday when she went to her PCP. At her PCP visit this past Thursday her PCP increased her Metformin to 1000 mg BID and added Tradjenta 5 mg daily to her outpatient regiment for diabetes control. Patient states that she took the increased Metformin dose one day prior to being admitted and had not had the chance to pick up the [place] from her pharmacy yet. Patient also reports that she was given a glucometer by her PCP but she did not check her glucose with the glucometer yet as she did not understand how to use the meter nor what glucose values even mean.   Inquired about knowledge about A1C and patient reports that she does not know what an A1C is. Discussed A1C results (14.9% on 11/23/14) and explained what an A1C is, basic pathophysiology of DM Type 2, basic home care, importance of checking CBGs and maintaining good CBG control to prevent long-term and short-term complications. Discussed glucose and A1C goals. Discussed impact of nutrition, exercise, stress, sickness, and medications on diabetes control.  Patient states that she has been under an excessive amount of stress recently and she has not really been focused on her diabetes.  Patient reports that she never received much of any education on Carb modified diet.  Discussed carbohydrates, carbohydrate goals per day and meal, along with portion sizes. Placed order for RD consult for diet education. Discussed how to use a glucometer, hyperglycemia, hypoglycemia, and treatment for both. Encouraged patient to check her glucose 4 times per day and to keep a log of glucose readings. Reviewed how to document in glucose log. Instructed patient to take the glucose log with her to follow up visits  so the doctor can use the information to continue to make medication adjustments to improve glycemic control.   Patient states that her nurse Legrand Como, RN has already began insulin teaching with her and she gave herself an insulin injection with syringe. Discussed Lantus and Novolog insulin in detail. Educated patient on insulin injections with vial/syringe and an insulin pen. Patient states that she prefers to use an insulin pen as an outpatient if she is discharged on insulin. Patient inquired about her co-pay of insulin pens. Informed patient a consult for CM would be requested to run a benefits checks and determine co-pay cost of Lantus and Novolog.  Reviewed all steps of insulin pen including attachment of needle, 2-unit air shot, dialing up dose, giving injection, removing needle, disposal of sharps, storage of unused insulin, disposal of insulin etc. Patient able to provide successful return demonstration. Ordered insulin pen starter kit. Patient verbalized understanding of information discussed and she states that she has no further questions at this time related to diabetes.    If patient is discharged on insulin, she will need prescriptions for insulin pens, insulin pen needles, test strips (for glucometer), and lancets. Would also recommend ordering outpatient diabetes education.  Thanks, Barnie Alderman, RN, MSN, CCRN, CDE Diabetes Coordinator Inpatient Diabetes Program [phone] (Team Pager) [phone] ([place] office) [phone] [facility] office) [phone] [facility] office)

## 2014-11-24 NOTE — Progress Notes (Signed)
Inpatient Diabetes Program Recommendations  AACE/ADA: New Consensus Statement on Inpatient Glycemic Control (2013)  Target Ranges:  Prepandial:   less than 140 mg/dL      Peak postprandial:   less than 180 mg/dL (1-2 hours)      Critically ill patients:  140 - 180 mg/dL   Results for Desiree Mccall, Desiree Mccall (MRN [id number]) as of 11/24/2014 08:53  Ref. Range 11/23/2014 08:55 11/23/2014 11:20 11/23/2014 16:19 11/23/2014 21:24 11/24/2014 07:37  Glucose-Capillary Latest Ref Range: 65-99 mg/dL 409 (H) 811 (H) 914 (H) 303 (H) 269 (H)    Diabetes history: DM2 Outpatient Diabetes medications: Tradjenta 5 mg daily, Metformin 1000 mg BID Current orders for Inpatient glycemic control: Lantus 15 units daily, Novolog 0-9 units TID with meals  Inpatient Diabetes Program Recommendations Insulin - Basal: Please consider increasing Lantus to 20 units daily (starting today). Correction (SSI): Please consider increasing Novolog correction to moderate scale and adding Novolog bedtime correction scale. Insulin - Meal Coverage: Please consider ordering Novolog 5 units TID with meals for meal coverage. HgbA1C: A1C 14.9% on 11/23/14 indicating very poor glycemic control over the past 2-3 months. Outpatient DM medications will need to be adjusted; would recommend discharging on insulin.   Note: If patient will be discharged on insulin, please inform patient and nursing staff so patient can be educated on insulin and importance of improving glycemic control.  Thanks, Orlando Penner, RN, MSN, CCRN, CDE Diabetes Coordinator Inpatient Diabetes Program [phone] (Team Pager from 8am to 5pm) [phone] ([place] office) [phone] [facility] office) [phone] [facility] office)

## 2014-11-24 NOTE — Progress Notes (Signed)
ANTICOAGULATION CONSULT NOTE - [name]  Pharmacy Consult for WARFARIN Indication: Afib  [name]  Patient Measurements: Height:  [name]: 250 lb 12.8 oz (113.762 kg) [name] (Calculated) : 61.6   Vital Signs: Temp: 98.4 F (36.9 C) (07/24 2338) Temp Source: Oral (07/24 2338) BP: 137/66 mmHg (07/24 2338) Pulse Rate: 76 (07/24 2338)  Labs:  Recent Labs  11/22/14 1948 11/23/14 0627 11/24/14 0600  HGB 12.9 11.3*  --   HCT 39.4 34.4*  --   PLT 228 193  --   [name] 20.1* 20.5* 18.9*  [name] 1.69 1.74 1.56  CREATININE 1.01* 0.81  --     Estimated Creatinine Clearance: 95 mL/min (by C-G formula based on Cr of 0.81).   Medical History: Past Medical History  Diagnosis Date  . Aortic dissection 2010  . Heart attack sept. 25, 2010  . Diabetes mellitus without complication   . CHF (congestive heart failure)   . [name]     Medications:  Scheduled:  . ampicillin-sulbactam (UNASYN) IV  3 g Intravenous Q8H  . atorvastatin  40 mg Oral QPM  . buPROPion  150 mg Oral QPM  . docusate sodium  100 mg Oral BID  . enoxaparin (LOVENOX) injection  40 mg Subcutaneous Q24H  . FLUoxetine  80 mg Oral q morning - 10a  . insulin aspart  0-5 Units Subcutaneous QHS  . insulin aspart  0-9 Units Subcutaneous TID WC  . insulin glargine  15 Units Subcutaneous Daily  . losartan  50 mg Oral q morning - 10a  . metoprolol succinate  50 mg Oral Daily  . warfarin  7.5 mg Oral ONCE-1800  . Warfarin - Pharmacist Dosing Inpatient   Does not apply q1800    Assessment: 64 yo female admitted with cat bite, cellulits. Patient is on Warfarin at home for AFib per MD note. Of note, pt was on ceftriaxone and metronidazole changed to Unasyn today.   Confirmed home dose with pt: Warfarin 7.5 mg Mon,Wed, Fri and Warfarin 3.75 mg (1/2 of 7.5mg  tab) on Tue,Thur,Sat,Sun. Last dose taken day before last (Friday), 7.5 mg.   Per [name], ok to resume warfarin  today; likely will not need surgery.   7/23 [name] 1.69 - no warfarin given due to possible surgery 7/24 [name] 1.74 - [name] subtherapeutic; 7.5 mg  7/25 [name] 1.56- [name] subtherapeutic;   Goal of Therapy:  [name] 2-3 Monitor platelets by anticoagulation protocol: Yes    Plan:  Will order warfarin 7.5 mg PO x1 for today [name] in AM   Order for enoxaparin noted; [name] subtherapeutic.  Will continue to follow.    Demetrius Charity, PharmD Clinical Pharmacist  11/24/2014 7:52 AM

## 2014-11-24 NOTE — Care Management (Addendum)
Faxed Rx to patient [facility] [address] [facility] for co pay information. Pateint stated that she already has meter and strips.

## 2014-11-24 NOTE — Evaluation (Signed)
Occupational Therapy Evaluation Patient Details Name: TEFFANY BLASZCZYK MRN: [id number] DOB: [birth year] Today's Date: 11/24/2014    History of Present Illness This patient is a 64 year old female who came to [facility] after a cat bite causing an infection.   Clinical Impression   This patient is a 64 year old female who came to [facility] after a cat bite causing an infection. She lives in a one floor apartment with one step to enter. She is independent with ADL/functional mobility and drives. She does not work. She does have some difficulty holding a spoon/fork. She would benefit from Occupational Therapy for right upper extremity restoration.    Follow Up Recommendations   (Home)    Equipment Recommendations       Recommendations for Other Services       Precautions / Restrictions Precautions Required Braces or Orthoses:  (Wrist hand splint removable) Restrictions Weight Bearing Restrictions: No      Mobility Bed Mobility Overal bed mobility: Independent                Transfers                      Balance                                            ADL Overall ADL's : Independent                                       General ADL Comments: Patient now has some dificulty eating but does manage. Issued a built up handle for spoon and fork.     Vision     Perception     Praxis      Pertinent Vitals/Pain       Hand Dominance Right   Extremity/Trunk Assessment Upper Extremity Assessment Upper Extremity Assessment:  (L UE and right shoulder 5/5 right elbow and wrsit not testted secondary to pain.)           Communication Communication Communication: No difficulties   Cognition Arousal/Alertness: Awake/alert Behavior During Therapy: WFL for tasks assessed/performed Overall Cognitive Status: Within Functional Limits for tasks assessed                     General Comments        Exercises   Splint removed for R upper extremity active exercises shoulder flexion, external and internal rotation elbow flexion and extension, forearm supination and pronation, wrist flexion and extension, and hand flexion and extension 5 repetitions. Tendon gliding exercises given and instructed to continue.       Shoulder Instructions      Home Living Family/patient expects to be discharged to:: Private residence Living Arrangements: Alone Available Help at Discharge:  (daughter close) Type of Home: Apartment Home Access: Stairs to enter [employer] of Steps: 1   Home Layout: One level                          Prior Functioning/Environment Level of Independence: Independent        Comments: Included driving, does not work    OT Diagnosis: Acute pain   OT Problem List: Decreased strength;Decreased range of motion  OT Treatment/Interventions:      OT Goals(Current goals can be found in the care plan section) Acute Rehab OT Goals Patient Stated Goal: To go home  OT Goal Formulation: With patient Time For Goal Achievement: 12/08/14 Potential to Achieve Goals: Good  OT Frequency:     Barriers to D/C:            Co-evaluation              End of Session Equipment Utilized During Treatment:  (built up handle)  Activity Tolerance: Patient tolerated treatment well Patient left:     Time: 9604-5409 OT Time Calculation (min): 24 min Charges:  OT General Charges $OT Visit: 1 Procedure OT Evaluation $Initial OT Evaluation Tier I: 1 Procedure OT Treatments $Therapeutic Exercise: 8-22 mins G-Codes:    Gwyndolyn Kaufman, MS/OTR/L  11/24/2014, 11:29 AM

## 2014-11-24 NOTE — Progress Notes (Signed)
Patient ID: Desiree Mccall, female   DOB: 07-12-1950, 64 y.o.   MRN: [id number] [facility] PROGRESS NOTE  PCP: Leanna Sato, MD  HPI/Subjective: Patient states that with the right wrist splint she does not have pain. Once I took off the wrist splint she does have pain with any movement of the wrist and fingers. Patient understands that she'll need insulin upon going home and is willing to do whatever it takes.  Objective: Filed Vitals:   11/24/14 0847  BP: 125/63  Pulse: 79  Temp: 98.4 F (36.9 C)  Resp: 17    Intake/Output Summary (Last 24 hours) at 11/24/14 1101 Last data filed at 11/24/14 0847  Gross per 24 hour  Intake 1997.67 ml  Output      0 ml  Net 1997.67 ml   Filed Weights   11/22/14 2133 11/23/14 0214  Weight: 110.678 kg (244 lb) 113.762 kg (250 lb 12.8 oz)    ROS: Review of Systems  Constitutional: Negative for fever and chills.  Eyes: Negative for blurred vision.  Respiratory: Negative for cough and shortness of breath.   Cardiovascular: Negative for chest pain.  Gastrointestinal: Negative for nausea, vomiting, abdominal pain, diarrhea and constipation.  Genitourinary: Negative for dysuria.  Musculoskeletal: Positive for joint pain.  Neurological: Negative for dizziness and headaches.   Exam: Physical Exam  HENT:  Nose: No mucosal edema.  Mouth/Throat: No oropharyngeal exudate or posterior oropharyngeal edema.  Eyes: Conjunctivae, EOM and lids are normal. Pupils are equal, round, and reactive to light.  Neck: No JVD present. Carotid bruit is not present. No edema present. No thyroid mass and no thyromegaly present.  Cardiovascular: S1 normal and S2 normal.  Exam reveals no gallop.   No murmur heard. Pulses:      Dorsalis pedis pulses are 2+ on the right side, and 2+ on the left side.  Respiratory: No respiratory distress. She has no wheezes. She has no rhonchi. She has no rales.  GI: Soft. Bowel sounds are normal. There is no tenderness.   Musculoskeletal:       Right wrist: She exhibits decreased range of motion, tenderness and swelling.       Right ankle: She exhibits no swelling.       Left ankle: She exhibits no swelling.  Pain on bending right wrist and limited motion secondary to swelling of the forearm.  Lymphadenopathy:    She has no cervical adenopathy.  Neurological: She is alert. No cranial nerve deficit.  Skin: Skin is warm. Nails show no clubbing.  Right forearm 2 sets of bites with scab. Some erythema on the right arm and warmth. Swelling of the right forearm.  Psychiatric: She has a normal mood and affect.    Data Reviewed: Basic Metabolic Panel:  Recent Labs Lab 11/22/14 1948 11/23/14 0627  NA 126* 134*  K 4.0 4.1  CL 91* 101  CO2 22 21*  GLUCOSE 534* 443*  BUN 18 14  CREATININE 1.01* 0.81  CALCIUM 8.8* 8.4*   Liver Function Tests:  Recent Labs Lab 11/23/14 0627  AST 20  ALT 21  ALKPHOS 108  BILITOT 1.2  PROT 6.3*  ALBUMIN 3.1*   CBC:  Recent Labs Lab 11/22/14 1948 11/23/14 0627  WBC 11.5* 11.6*  NEUTROABS 8.5*  --   HGB 12.9 11.3*  HCT 39.4 34.4*  MCV 86.4 86.9  PLT 228 193   CBG:  Recent Labs Lab 11/23/14 0855 11/23/14 1120 11/23/14 1619 11/23/14 2124 11/24/14 0737  GLUCAP  415* 380* 301* 303* 269*    Scheduled Meds: . ampicillin-sulbactam (UNASYN) IV  3 g Intravenous Q8H  . atorvastatin  40 mg Oral QPM  . buPROPion  150 mg Oral QPM  . docusate sodium  100 mg Oral BID  . enoxaparin (LOVENOX) injection  40 mg Subcutaneous Q24H  . FLUoxetine  80 mg Oral q morning - 10a  . insulin aspart  0-5 Units Subcutaneous QHS  . insulin aspart  0-9 Units Subcutaneous TID WC  . insulin aspart  5 Units Subcutaneous TID WC  . [START ON 11/25/2014] insulin glargine  20 Units Subcutaneous Daily  . living well with diabetes book   Does not apply Once  . losartan  50 mg Oral q morning - 10a  . metoprolol succinate  50 mg Oral Daily  . warfarin  7.5 mg Oral ONCE-1800  .  Warfarin - Pharmacist Dosing Inpatient   Does not apply q1800    Assessment/Plan:  1. Cat bite cellulitis- IV Unasyn. The patient is unable to move her wrist much with the swelling in her forearm. Pain with movement of the wrist. She feels better in the splint. Likely home in the next day or 2. 2. History of severe cardiomyopathy- DC IV fluids, restart the low-dose Lasix and spironolactone. 3. Diabetes with hyperglycemia- hemoglobin A1c elevated at 14.9. I will increase Lantus dose to 20 units subcutaneous injection daily with NovoLog sliding scale and 5 units prior to meals. 4. Hyperlipidemia unspecified- on atorvastatin. 5. Hyponatremia improved. 6. History coronary artery disease, peripheral vascular disease and congestive heart failure. Currently no signs of heart failure.  7. History of severe cardiomyopathy - continue Coumadin- pharmacy to dose.  Code Status:     Code Status Orders        Start     Ordered   11/23/14 0218  Full code   Continuous     11/23/14 0217      Disposition Plan: Home once swelling of the arm decreases and patient has better range of motion with her right wrist. Possibly the next day or so.  Consultants:  Orthopedic surgery  Antibiotics:  IV Unasyn   Time spent: 20 minutes  Alford Highland  [facility]

## 2014-11-25 DIAGNOSIS — L03114 Cellulitis of left upper limb: Secondary | ICD-10-CM | POA: Diagnosis not present

## 2014-11-25 LAB — PROTIME-INR
INR: 1.47
Prothrombin Time: 18 seconds — ABNORMAL HIGH (ref 11.4–15.0)

## 2014-11-25 LAB — GLUCOSE, CAPILLARY: Glucose-Capillary: 292 mg/dL — ABNORMAL HIGH (ref 65–99)

## 2014-11-25 MED ORDER — WARFARIN SODIUM 7.5 MG PO TABS: 7.5000 mg | ORAL_TABLET | Freq: Once | ORAL | Status: DC

## 2014-11-25 MED ORDER — RABIES VACCINE, PCEC IM SUSR
1.0000 mL | INTRAMUSCULAR | Status: AC
  Administered 2014-11-25: 1 mL via INTRAMUSCULAR
  Filled 2014-11-25: qty 1

## 2014-11-25 MED ORDER — INSULIN ASPART PROT & ASPART (70-30 MIX) 100 UNIT/ML ~~LOC~~ SUSP: 18.0000 [IU] | Freq: Two times a day (BID) | SUBCUTANEOUS | Status: DC

## 2014-11-25 MED ORDER — NYSTATIN 100000 UNIT/GM EX CREA
TOPICAL_CREAM | Freq: Two times a day (BID) | CUTANEOUS | Status: DC
  Administered 2014-11-25: 10:00:00 via TOPICAL
  Filled 2014-11-25: qty 15

## 2014-11-25 MED ORDER — INSULIN NPH ISOPHANE & REGULAR (70-30) 100 UNIT/ML ~~LOC~~ SUSP: 18.0000 [IU] | Freq: Two times a day (BID) | SUBCUTANEOUS | Status: DC

## 2014-11-25 MED ORDER — NYSTATIN 100000 UNIT/GM EX CREA: TOPICAL_CREAM | Freq: Two times a day (BID) | CUTANEOUS | Status: DC

## 2014-11-25 MED ORDER — WARFARIN SODIUM 10 MG PO TABS
10.0000 mg | ORAL_TABLET | ORAL | Status: AC
  Administered 2014-11-25: 10 mg via ORAL
  Filled 2014-11-25: qty 1

## 2014-11-25 MED ORDER — AMOXICILLIN-POT CLAVULANATE 875-125 MG PO TABS: 1.0000 | ORAL_TABLET | Freq: Two times a day (BID) | ORAL | Status: DC

## 2014-11-25 MED ORDER — INSULIN ASPART PROT & ASPART (70-30 MIX) 100 UNIT/ML ~~LOC~~ SUSP
18.0000 [IU] | Freq: Once | SUBCUTANEOUS | Status: AC
  Administered 2014-11-25: 18 [IU] via SUBCUTANEOUS
  Filled 2014-11-25: qty 18

## 2014-11-25 NOTE — Progress Notes (Signed)
  RD consulted for nutrition education regarding diabetes.   Lab Results  Component Value Date   HGBA1C 14.9* 11/23/2014    RD provided "Carbohydrate Counting for People with Diabetes" handout from the Academy of Nutrition and Dietetics. Discussed different food groups and their effects on blood sugar, emphasizing carbohydrate-containing foods. Provided list of carbohydrates and recommended serving sizes of common foods.  Discussed importance of controlled and consistent carbohydrate intake throughout the day. Provided examples of ways to balance meals/snacks and encouraged intake of high-fiber, whole grain complex carbohydrates. Teach back method used.  Expect good compliance.  Body mass index is 39.27 kg/(m^2).   Current diet order is heart healthy carb modified patient is consuming approximately 100% of meals at this time. Labs and medications reviewed. No further nutrition interventions warranted at this time. RD contact information provided. If additional nutrition issues arise, please re-consult RD.  Mortimer Bair B. Freida Busman, RD, LDN [phone] (pager)

## 2014-11-25 NOTE — Progress Notes (Signed)
ANTICOAGULATION CONSULT NOTE - Follow up [place] for WARFARIN Indication: Afib  [name]  Patient Measurements: Height: 5' 7"  [name]: 250 lb 12.8 oz (113.762 kg) [name] (Calculated) : 61.6   Vital Signs: Temp: 98.4 F (36.9 C) (07/26 0002) Temp Source: Oral (07/26 0002) BP: 102/65 mmHg (07/26 0002) Pulse Rate: 71 (07/26 0002)  Labs:  Recent Labs  11/22/14 1948 11/23/14 0627 11/24/14 0600 11/25/14 0428  HGB 12.9 11.3*  --   --   HCT 39.4 34.4*  --   --   PLT 228 193  --   --   [name] 20.1* 20.5* 18.9* 18.0*  [name] 1.69 1.74 1.56 1.47  CREATININE 1.01* 0.81  --   --     Estimated Creatinine Clearance: 95 mL/min (by C-G formula based on Cr of 0.81).   Medical History: Past Medical History  Diagnosis Date  . Aortic dissection 2010  . Heart attack sept. 25, 2010  . Diabetes mellitus without complication   . CHF (congestive heart failure)   . [name]     Medications:  Scheduled:  . ampicillin-sulbactam (UNASYN) IV  3 g Intravenous Q8H  . atorvastatin  40 mg Oral QPM  . buPROPion  150 mg Oral QPM  . docusate sodium  100 mg Oral BID  . enoxaparin (LOVENOX) injection  40 mg Subcutaneous Q24H  . FLUoxetine  80 mg Oral q morning - 10a  . furosemide  40 mg Oral Daily  . insulin aspart  0-5 Units Subcutaneous QHS  . insulin aspart  0-9 Units Subcutaneous TID WC  . insulin aspart  5 Units Subcutaneous TID WC  . insulin glargine  20 Units Subcutaneous Daily  . insulin starter kit- pen needles  1 kit Other Once  . losartan  50 mg Oral q morning - 10a  . metoprolol succinate  50 mg Oral Daily  . spironolactone  25 mg Oral Daily  . warfarin  7.5 mg Oral ONCE-1800  . Warfarin - Pharmacist Dosing Inpatient   Does not apply q1800    Assessment: 64 yo female admitted with cat bite, cellulits. Patient is on Warfarin at home for AFib per MD note. Of note, pt was on ceftriaxone and metronidazole changed to Unasyn today.    Confirmed home dose with pt: Warfarin 7.5 mg Mon,Wed, Fri and Warfarin 3.75 mg (1/2 of 7.24m tab) on Tue,Thur,Sat,Sun. Last dose taken day before last (Friday), 7.5 mg.   Per Dr. WLeslye Peer ok to resume warfarin today; likely will not need surgery.   7/23 [name] 1.69 - no warfarin given due to possible surgery 7/24 [name] 1.74 - [name] subtherapeutic; 7.5 mg  7/25 [name] 1.56- [name] subtherapeutic; 7.5 mg 7/26: [name]: 1.47- [name] subtherapeutic  Goal of Therapy:  [name] 2-3 Monitor platelets by anticoagulation protocol: Yes    Plan:  Will order warfarin 7.5 mg PO x1 for today [name] in AM   Order for enoxaparin noted; [name] subtherapeutic.  Will continue to follow.    TLarene Beach PharmD Clinical Pharmacist  11/25/2014 7:49 AM

## 2014-11-25 NOTE — Progress Notes (Addendum)
Inpatient Diabetes Program Recommendations  AACE/ADA: New Consensus Statement on Inpatient Glycemic Control (2013)  Target Ranges:  Prepandial:   less than 140 mg/dL      Peak postprandial:   less than 180 mg/dL (1-2 hours)      Critically ill patients:  140 - 180 mg/dL   Results for Desiree Mccall, Desiree Mccall (MRN [id number]) as of 11/25/2014 07:59  Ref. Range 11/24/2014 07:37 11/24/2014 11:18 11/24/2014 16:32 11/24/2014 21:19 11/25/2014 07:24  Glucose-Capillary Latest Ref Range: 65-99 mg/dL 119 (H) 147 (H) 829 (H) 253 (H) 292 (H)    Diabetes history: DM2 Outpatient Diabetes medications: Tradjenta 5 mg daily, Metformin 1000 mg BID Current orders for Inpatient glycemic control: Lantus 20 units daily, Novolog 0-9 units TID with meals, Novolog 0-5 units HS, Novolog 5 units TID with meals  Inpatient Diabetes Program Recommendations Insulin - Basal: Note Lantus was increased to 20 units daily and patient will receive first increased dose this morning.  Correction (SSI): Please consider increasing Novolog correction to moderate scale. Insulin - Meal Coverage: Please consider increasing meal coverage to Novolog 10 units TID with meals. HgbA1C: A1C 14.9% on 11/23/14 indicating very poor glycemic control over the past 2-3 months. Outpatient DM medications will need to be adjusted; would recommend discharging on insulin.    Note: According to Case Management patient's copay for Lantus is $45 and patient states that she can not afford the copay for both Lantus and Humalog. Therefore, would recommend discharging patient on NOVOLIN 70/30 vials which patient can purchase at [facility] for $25 (cost without any insurance so her co-pay may be even less).   Recommend discharging on Novolin 70/30 18 units BID (which will provide 25 units for basal and 11 units for meal coverage per day) and have patient follow up with her PCP by Friday for further insulin adjustments. Please have patient check her glucose 4 times per day  (before meals and at bedtime) so that her PCP will have more information to make insulin adjustments. Patient will also need a prescription for insulin syringes.   Thanks, Orlando Penner, RN, MSN, CCRN, CDE Diabetes Coordinator Inpatient Diabetes Program [phone] (Team Pager from 8am to 5pm) [phone] ([facility] office) [phone] [facility] office) [phone] [facility] office)

## 2014-11-25 NOTE — Progress Notes (Signed)
Pt d/c home; d/c instructions reviewed w/ pt; pt understanding was verbalized; IV removed catheter in tact, gauze dressing applied; all pt questions answered; pt left unit via wheelchair accompanied by staff 

## 2014-11-25 NOTE — Discharge Instructions (Signed)

## 2014-11-25 NOTE — Care Management (Signed)
Spoke with Diabetes coordinatorHilda Lias Byrd)concerning planned insulin coverage for discharge. Recommended medications would be cost prohibitive. Alternative plan to be discussed with attending Dr R. Wieting by diabetes coordinator. See coordinators notes. Patient stated that she could afford alternative Novulin $25 vial at [facility] pharmacy.

## 2014-11-25 NOTE — Plan of Care (Signed)
Spoke with [name] regarding consult for groin rash.  He does not think she needs an inpatient consult at this time. She is to be discharged today and will follow up with her PCP.  Will discontinue consult order based on this conversation.

## 2014-11-25 NOTE — Discharge Summary (Signed)
[place] [facility] [place] NAME: Desiree Mccall    MR#:  [id number]  DATE OF BIRTH:  12-18-1950  DATE OF ADMISSION:  11/22/2014 ADMITTING PHYSICIAN: Nicholes Mango, MD  DATE OF DISCHARGE: 11/25/2014  PRIMARY CARE PHYSICIAN: Marguerita Merles, MD    ADMISSION DIAGNOSIS:  Cat bite, initial encounter [W55.01XA]  DISCHARGE DIAGNOSIS:  Active Problems:   Cat bite of forearm   SECONDARY DIAGNOSIS:   Past Medical History  Diagnosis Date  . Aortic dissection 2010  . Heart attack sept. 25, 2010  . Diabetes mellitus without complication   . CHF (congestive heart failure)   . [name]     HOSPITAL COURSE:   1. Cellulitis of right arm secondary to cat bite. Patient was started on IV antibiotics and I switched it over to IV Unasyn. Upon discharge I will give oral Augmentin. The patient had a tremendous amount of swelling in her right forearm which limited her range of motion in the right wrist. Patient was seen in consultation by Dr. Earnestine Leys and there are no acute indications for surgical intervention. The swelling has come down a little bit but still has some limited range of motion and it is painful. She feels a lot better with the wrist brace. Patient will need to finish up the rabies vaccination- the pharmacist will look into the protocol and determine where she can follow-up for this. 2. Type 2 diabetes with hyperglycemia. Hemoglobin A1c elevated at 14.9. Her average sugar is up at 390. Insulin was started during the hospital course. Unfortunately she cannot afford the Lantus and Apidra. I prescribed Novolin 70 3018 units twice a day. Follow-up as outpatient. 3. History of cardiomyopathy, coronary artery disease and peripheral vascular disease and congestive heart failure. No signs of congestive heart failure on this hospital course continue all of her usual cardiac medications. Her Coumadin level little bit low 10 mg will be given  today and going back on a regular dose recommend checking an [name] in follow-up appointment. 4. Hyponatremia this had improved. 5. Rash in the groin looks fungal will give nystatin cream. 6. Essential hypertension blood pressure was on the lower side she states that she normally gets that if they take her blood pressure in the left arm ever since her aortic aneurysm surgery. Blood pressure checks were done in the right arm. DISCHARGE CONDITIONS:   Satisfactory  CONSULTS OBTAINED:  Treatment Team:  Nicholes Mango, MD Earnestine Leys, MD  DRUG ALLERGIES:  [name]  DISCHARGE MEDICATIONS:   Current Discharge Medication List    START taking these medications   Details  amoxicillin-clavulanate (AUGMENTIN) 875-125 MG per tablet Take 1 tablet by mouth 2 (two) times daily. Qty: 20 tablet, Refills: 0    insulin NPH-regular Human (NOVOLIN 70/30) (70-30) 100 UNIT/ML injection Inject 18 Units into the skin 2 (two) times daily with a meal. Qty: 10 mL, Refills: 11    nystatin cream (MYCOSTATIN) Apply topically 2 (two) times daily. Qty: 30 g, Refills: 0      CONTINUE these medications which have NOT CHANGED   Details  ACCU-CHEK AVIVA PLUS test strip Inject 1 strip into the skin daily. Check blood sugars    ACCU-CHEK SOFTCLIX LANCETS lancets 1 each by Other route daily. To check blood sugars    atorvastatin (LIPITOR) 40 MG tablet Take 1 tablet by mouth every evening.    Blood Glucose Monitoring Suppl (ACCU-CHEK AVIVA PLUS) W/DEVICE KIT Inject 1 Device into  the skin daily.    buPROPion (WELLBUTRIN XL) 150 MG 24 hr tablet Take 1 tablet by mouth every evening.    clopidogrel (PLAVIX) 75 MG tablet Take 1 tablet by mouth every morning.    FLUoxetine (PROZAC) 40 MG capsule Take 2 capsules by mouth every morning.    furosemide (LASIX) 40 MG tablet Take 1 tablet by mouth every morning.    LORazepam (ATIVAN) 0.5 MG tablet Take 1 tablet by mouth daily as needed.    losartan (COZAAR) 50  MG tablet Take 1 tablet by mouth every morning.    metoprolol succinate (TOPROL-XL) 100 MG 24 hr tablet Take 1 tablet by mouth daily.    spironolactone (ALDACTONE) 25 MG tablet Take 1 tablet by mouth daily.    warfarin (COUMADIN) 7.5 MG tablet Take 1 tablet by mouth daily. TK 1 TAB PO MON, WEDS, FRI. 0.5 TAB TUES, THURS, SAT AND SUN.      STOP taking these medications     metFORMIN (GLUCOPHAGE) 1000 MG tablet      TRADJENTA 5 MG TABS tablet          DISCHARGE INSTRUCTIONS:   Follow-up with Dr. Earnestine Leys one week Follow-up with medical doctor Dr. Delight Stare one week. Follow-up with third rabies vaccination.   If you experience worsening of your admission symptoms, develop shortness of breath, life threatening emergency, suicidal or homicidal thoughts you must seek medical attention immediately by calling 911 or calling your MD immediately  if symptoms less severe.  You Must read complete instructions/literature along with all the possible adverse reactions/side effects for all the Medicines you take and that have been prescribed to you. Take any new Medicines after you have completely understood and accept all the possible adverse reactions/side effects.   Please note  You were cared for by a hospitalist during your hospital stay. If you have any questions about your discharge medications or the care you received while you were in the hospital after you are discharged, you can call the unit and asked to speak with the hospitalist on call if the hospitalist that took care of you is not available. Once you are discharged, your primary care physician will handle any further medical issues. Please note that NO REFILLS for any discharge medications will be authorized once you are discharged, as it is imperative that you return to your primary care physician (or establish a relationship with a primary care physician if you do not have one) for your aftercare needs so that they can  reassess your need for medications and monitor your lab values.    Today   CHIEF COMPLAINT:   Chief Complaint  Patient presents with  . Animal Bite    Pt. states cat bit rt. lower extremity thursday morning.    HISTORY OF PRESENT ILLNESS:  Desiree Mccall  is a 64 y.o. female with a known history of CAD, hypertension, CHF, cardiomyopathy. Presented after cat bite. Found to have right arm cellulitis and decreased range of motion and pain.  VITAL SIGNS:  Blood pressure 108/90, pulse 91, temperature 98.1 F (36.7 C), temperature source Oral, resp. rate 16, height _0  (1.702 m), weight 113.762 kg (250 lb 12.8 oz), SpO2 93 %.   PHYSICAL EXAMINATION:  GENERAL:  64 y.o.-year-old patient lying in the bed with no acute distress.  EYES: Pupils equal, round, reactive to light and accommodation. No scleral icterus. Extraocular muscles intact.  HEENT: Head atraumatic, normocephalic. Oropharynx and nasopharynx clear.  NECK:  Supple,  no jugular venous distention. No thyroid enlargement, no tenderness.  LUNGS: Normal breath sounds bilaterally, no wheezing, rales,rhonchi or crepitation. No use of accessory muscles of respiration.  CARDIOVASCULAR: S1, S2 normal. No murmurs, rubs, or gallops.  ABDOMEN: Soft, non-tender, non-distended. Bowel sounds present. No organomegaly or mass.  EXTREMITIES: Right forearm swelling. Better range of motion right wrist and hand. Still limited because of swelling of the right forearm.  NEUROLOGIC: Cranial nerves II through XII are intact. Muscle strength 5/5 in all extremities. Sensation intact. Gait not checked.  PSYCHIATRIC: The patient is alert and oriented x 3.  SKIN: Right arm erythema improved scabs at site of Bite.   DATA REVIEW:   CBC  Recent Labs Lab 11/23/14 0627  WBC 11.6*  HGB 11.3*  HCT 34.4*  PLT 193    Chemistries   Recent Labs Lab 11/23/14 0627  NA 134*  K 4.1  CL 101  CO2 21*  GLUCOSE 443*  BUN 14  CREATININE 0.81  CALCIUM 8.4*   AST 20  ALT 21  ALKPHOS 108  BILITOT 1.2   Management plans discussed with the patient, and she is in agreement.  CODE STATUS:     Code Status Orders        Start     Ordered   11/23/14 0218  Full code   Continuous     11/23/14 0217      TOTAL TIME TAKING CARE OF THIS PATIENT: 45 minutes.    Loletha Grayer M.D on 11/25/2014 at 9:23 AM  Between 7am to 6pm - Pager - [phone]  After 6pm go to [email or web address] - password [email or web address] [facility]  Office  [phone]  CC: Primary care physician; Marguerita Merles, MD

## 2014-11-26 ENCOUNTER — Emergency Department
Admission: EM | Admit: 2014-11-26 | Discharge: 2014-11-26 | Disposition: A | Discharge status: Home / Self Care | Payer: Medicare Other | Attending: Emergency Medicine | Admitting: Emergency Medicine

## 2014-11-26 DIAGNOSIS — E119 Type 2 diabetes mellitus without complications: Secondary | ICD-10-CM | POA: Diagnosis not present

## 2014-11-26 DIAGNOSIS — Z79899 Other long term (current) drug therapy: Secondary | ICD-10-CM | POA: Diagnosis not present

## 2014-11-26 DIAGNOSIS — L03113 Cellulitis of right upper limb: Secondary | ICD-10-CM | POA: Insufficient documentation

## 2014-11-26 DIAGNOSIS — Z7901 Long term (current) use of anticoagulants: Secondary | ICD-10-CM | POA: Diagnosis not present

## 2014-11-26 DIAGNOSIS — Z794 Long term (current) use of insulin: Secondary | ICD-10-CM | POA: Diagnosis not present

## 2014-11-26 DIAGNOSIS — Z5189 Encounter for other specified aftercare: Secondary | ICD-10-CM

## 2014-11-26 DIAGNOSIS — Z792 Long term (current) use of antibiotics: Secondary | ICD-10-CM | POA: Diagnosis not present

## 2014-11-26 DIAGNOSIS — Z7902 Long term (current) use of antithrombotics/antiplatelets: Secondary | ICD-10-CM | POA: Diagnosis not present

## 2014-11-26 DIAGNOSIS — Z23 Encounter for immunization: Secondary | ICD-10-CM | POA: Insufficient documentation

## 2014-11-26 MED ORDER — RABIES IMMUNE GLOBULIN 150 UNIT/ML IM INJ
20.0000 [IU]/kg | INJECTION | Freq: Once | INTRAMUSCULAR | Status: AC
  Administered 2014-11-26: 2250 [IU] via INTRAMUSCULAR
  Filled 2014-11-26: qty 15

## 2014-11-26 NOTE — Discharge Instructions (Signed)
Rabies  °Rabies is a viral infection that can be spread to people from infected animals. The infection affects the brain and central nervous system. Once the disease develops, it almost always causes death. Because of this, when a person is bitten by an animal that may have rabies, treatment to prevent rabies often needs to be started whether or not the animal is known to be infected. Prompt treatment with the rabies vaccine and rabies immune globulin is very effective at preventing the infection from developing in people who have been exposed to the rabies virus. °CAUSES  °Rabies is caused by a virus that lives inside some animals. When a person is bitten by an infected animal, the rabies virus is spread to the person through the infected spit (saliva) of the animal. This virus can be carried by animals such as dogs, cats, skunks, bats, woodchucks, raccoons, coyotes, and foxes. °SYMPTOMS  °By the time symptoms appear, rabies is usually fatal for the person. Common symptoms include: °· Headache. °· Fever. °· Fatigue and weakness. °· Agitation. °· Anxiety. °· Confusion. °· Unusual behavior, such as hyperactivity, fear of water (hydrophobia), or fear of air (aerophobia). °· Hallucinations. °· Insomnia. °· Weakness in the arms or legs. °· Difficulty swallowing. °Most people get sick in 1-3 months after being bitten. This often varies and may depend on the location of the bite. The infection will take less time to develop if the bite occurred closer to the head.  °DIAGNOSIS  °To determine if a person is infected, several tests must be performed, such as: °· A skin biopsy. °· A saliva test. °· A lumbar puncture to remove spinal fluid so it can be examined. °· Blood tests. °TREATMENT  °Treatment to prevent the infection from developing (post-exposure prophylaxis, PEP) is often started before knowing for sure if the person has been exposed to the rabies virus. PEP involves cleaning the wound, giving an antibody injection  (rabies immune globulin), and giving a series of rabies vaccine injections. The series of injections are usually given over a two-week period. If possible, the animal that bit the person will be observed to see if it remains healthy. If the animal has been killed, it can be sent to a state laboratory and examined to see if the animal had rabies. °If a person is bitten by a domestic animal (dog, cat, or ferret) that appears healthy and can be observed to see if it remains healthy, often no further treatment is necessary other than care of the wounds caused by the animal. °Rabies is often a fatal illness once the infection develops in a person. Although a few people who developed rabies have survived after experimental treatment with certain drugs, all these survivors still had severe nervous system problems after the treatment. This is why caregivers use extra caution and begin PEP treatment for people who have been bitten by animals that are possibly infected with rabies.  °HOME CARE INSTRUCTIONS  °If you were bitten by an unknown animal, make sure you know your caregiver's instructions for follow-up. If the animal was sent to a laboratory for examination, ask when the test results will be ready. Make sure you get the test results.  °Take these steps to care for your wound: °· Keep the wound clean, dry, and dressed as directed by your caregiver. °· Keep the injured part elevated as much as possible. °· Do not resume use of the affected area until directed. °· Only take over-the-counter or prescription medicines as directed by   your caregiver.  Keep all follow-up appointments as directed by your caregiver. PREVENTION  To prevent rabies, people need to reduce their risk of having contact with infected animals.   Make sure your pets (dogs, cats, ferrets) are vaccinated against rabies. Keep these vaccinations up-to-date as directed by your veterinarian.  Supervise your pets when they are outside. Keep them away  from wild animals.  Call your local animal control services to report any stray animals. These animals may not be vaccinated.  Stay away from stray or wild animals.  Consider getting the rabies vaccine (preexposure) if you are traveling to an area where rabies is common or if your job or activities involve possible contact with wild or stray animals. Discuss this with your caregiver. Document Released: 04/18/2005 Document Revised: 01/11/2012 Document Reviewed: 11/15/2011 [facility] Patient Information 2015 [place], [place]. This information is not intended to replace advice given to you by your health care provider. Make sure you discuss any questions you have with your health care provider.  Return as scheduled for your rabies vaccine series.

## 2014-11-26 NOTE — ED Notes (Signed)
Pt is here for 3 rabies injection.  Pt has a cat bite to right forearm.

## 2014-11-26 NOTE — ED Provider Notes (Signed)
[facility] Emergency Department Provider Note ____________________________________________  Time seen: 1515  I have reviewed the triage vital signs and the nursing notes.  HISTORY  Chief Complaint  No chief complaint on file.   HPI Desiree Mccall is a 64 y.o. female turns to the ED for continuation of her rabies series status post a cat bite and subsequent cellulitis to the right forearm. She had a short hospitalization when she presented here on July 23, for cellulitis and elevated blood sugars. He presents today for her ABC immunoglobulin injection. She reports improvement to her right wrist range of motion as well as decreased redness and swelling around the wound site. By mouth antibiotics without difficulty. She is scheduled to see Dr. Jamelle Rushing on Tuesday for post admission follow-up.  Past Medical History  Diagnosis Date  . Aortic dissection 2010  . Heart attack sept. 25, 2010  . Diabetes mellitus without complication   . CHF (congestive heart failure)   . [name]     Patient Active Problem List   Diagnosis Date Noted  . Cat bite of forearm 11/22/2014    Past Surgical History  Procedure Laterality Date  . Cholecystectomy    . Cardiac surgery    . Abdominal hysterectomy    . Carotid stent  2010  . Stent place left ureter ([facility] hx)      Current Outpatient Rx  Name  Route  Sig  Dispense  Refill  . ACCU-CHEK AVIVA PLUS test strip   Subcutaneous   Inject 1 strip into the skin daily. Check blood sugars           Dispense as written.   Marland Kitchen ACCU-CHEK SOFTCLIX LANCETS lancets   Other   1 each by Other route daily. To check blood sugars         . amoxicillin-clavulanate (AUGMENTIN) 875-125 MG per tablet   Oral   Take 1 tablet by mouth 2 (two) times daily.   20 tablet   0   . atorvastatin (LIPITOR) 40 MG tablet   Oral   Take 1 tablet by mouth every evening.         . Blood Glucose Monitoring Suppl (ACCU-CHEK AVIVA  PLUS) W/DEVICE KIT   Subcutaneous   Inject 1 Device into the skin daily.         Marland Kitchen buPROPion (WELLBUTRIN XL) 150 MG 24 hr tablet   Oral   Take 1 tablet by mouth every evening.         . clopidogrel (PLAVIX) 75 MG tablet   Oral   Take 1 tablet by mouth every morning.         Marland Kitchen FLUoxetine (PROZAC) 40 MG capsule   Oral   Take 2 capsules by mouth every morning.         . furosemide (LASIX) 40 MG tablet   Oral   Take 1 tablet by mouth every morning.         . insulin NPH-regular Human (NOVOLIN 70/30) (70-30) 100 UNIT/ML injection   Subcutaneous   Inject 18 Units into the skin 2 (two) times daily with a meal.   10 mL   11   . LORazepam (ATIVAN) 0.5 MG tablet   Oral   Take 1 tablet by mouth daily as needed.         Marland Kitchen losartan (COZAAR) 50 MG tablet   Oral   Take 1 tablet by mouth every morning.         Marland Kitchen  metoprolol succinate (TOPROL-XL) 100 MG 24 hr tablet   Oral   Take 1 tablet by mouth daily.         Marland Kitchen nystatin cream (MYCOSTATIN)   Topical   Apply topically 2 (two) times daily.   30 g   0   . spironolactone (ALDACTONE) 25 MG tablet   Oral   Take 1 tablet by mouth daily.         Marland Kitchen warfarin (COUMADIN) 7.5 MG tablet   Oral   Take 1 tablet by mouth daily. TK 1 TAB PO MON, WEDS, FRI. 0.5 TAB TUES, THURS, SAT AND SUN.          Allergies Review of patient's allergies indicates no known allergies.  Family History  Problem Relation Age of Onset  . Heart failure Mother   . Heart failure Father    Social History History  Substance Use Topics  . Smoking status: Never Smoker   . Smokeless tobacco: Not on file  . Alcohol Use: No   Review of Systems  Constitutional: Negative for fever. Eyes: Negative for visual changes. ENT: Negative for sore throat. Cardiovascular: Negative for chest pain. Respiratory: Negative for shortness of breath. Gastrointestinal: Negative for abdominal pain, vomiting and diarrhea. Genitourinary: Negative for  dysuria. Musculoskeletal: Negative for back pain. Skin: Negative for rash. Healing cellulitis Neurological: Negative for headaches, focal weakness or numbness. ____________________________________________  PHYSICAL EXAM:  VITAL SIGNS: ED Triage Vitals  Enc Vitals Group     BP 11/26/14 1452 139/62 mmHg     Pulse Rate 11/26/14 1452 6     Resp --      Temp 11/26/14 1452 98.3 F (36.8 C)     Temp Source 11/26/14 1452 Oral     SpO2 11/26/14 1452 95 %     Weight 11/26/14 1452 244 lb (110.678 kg)     Height 11/26/14 1452 5' 7"  (1.702 m)     Head Cir --      Peak Flow --      Pain Score 11/26/14 1452 0     Pain Loc --      Pain Edu? --      Excl. in [place]? --    Constitutional: Alert and oriented. Well appearing and in no distress. Eyes: Conjunctivae are normal. PERRL. Normal extraocular movements. ENT   Head: Normocephalic and atraumatic.   Nose: No congestion/rhinnorhea.   Mouth/Throat: Mucous membranes are moist. Cardiovascular: Normal distal pulses  Respiratory: Normal respiratory effort.  Musculoskeletal: Nontender with normal range of motion in the right wrist except slightly decreased wrist extension secondary to tightness.  Neurologic:  Normal gait without ataxia. Normal speech and language. No gross focal neurologic deficits are appreciated. Skin:  Skin is warm, dry and intact. No rash noted. Psychiatric: Mood and affect are normal. Patient exhibits appropriate insight and judgment. ____________________________________________  PROCEDURES  Rabies IgG 2,250 units IM ____________________________________________  INITIAL IMPRESSION / ASSESSMENT AND PLAN / ED COURSE  Wound check for right arm cellulitis and administration of her rabies immune globulin which was not available during her ED presentation on 11/22/14. ____________________________________________  FINAL CLINICAL IMPRESSION(S) / ED DIAGNOSES  Final diagnoses:  Visit for wound check  Need for  immunization against rabies     Melvenia Needles, PA-C 11/26/14 [place], PA-C 11/26/14 Risingsun Bacon Hustler, PA-C 11/26/14 1617  Lavonia Drafts, MD 11/26/14 2320

## 2014-11-29 ENCOUNTER — Emergency Department
Admission: EM | Admit: 2014-11-29 | Discharge: 2014-11-29 | Disposition: A | Discharge status: Home / Self Care | Payer: Medicare Other | Attending: Emergency Medicine | Admitting: Emergency Medicine

## 2014-11-29 DIAGNOSIS — E119 Type 2 diabetes mellitus without complications: Secondary | ICD-10-CM | POA: Diagnosis not present

## 2014-11-29 DIAGNOSIS — Z7901 Long term (current) use of anticoagulants: Secondary | ICD-10-CM | POA: Diagnosis not present

## 2014-11-29 DIAGNOSIS — Z23 Encounter for immunization: Secondary | ICD-10-CM | POA: Diagnosis present

## 2014-11-29 DIAGNOSIS — Z79899 Other long term (current) drug therapy: Secondary | ICD-10-CM | POA: Diagnosis not present

## 2014-11-29 MED ORDER — RABIES VACCINE, PCEC IM SUSR
1.0000 mL | Freq: Once | INTRAMUSCULAR | Status: AC
  Administered 2014-11-29: 1 mL via INTRAMUSCULAR
  Filled 2014-11-29: qty 1

## 2014-11-29 NOTE — ED Notes (Signed)
Pt is here for rabies injection. 

## 2014-11-29 NOTE — ED Notes (Signed)
Pt to ED for day 7 of rabies vaccine.

## 2014-11-29 NOTE — Discharge Instructions (Signed)
Rabies  Rabies is a viral infection that can be spread to people from infected animals. The infection affects the brain and central nervous system. Once the disease develops, it almost always causes death. Because of this, when a person is bitten by an animal that may have rabies, treatment to prevent rabies often needs to be started whether or not the animal is known to be infected. Prompt treatment with the rabies vaccine and rabies immune globulin is very effective at preventing the infection from developing in people who have been exposed to the rabies virus. CAUSES  Rabies is caused by a virus that lives inside some animals. When a person is bitten by an infected animal, the rabies virus is spread to the person through the infected spit (saliva) of the animal. This virus can be carried by animals such as dogs, cats, skunks, bats, woodchucks, raccoons, coyotes, and foxes. SYMPTOMS  By the time symptoms appear, rabies is usually fatal for the person. Common symptoms include:  Headache.  Fever.  Fatigue and weakness.  Agitation.  Anxiety.  Confusion.  Unusual behavior, such as hyperactivity, fear of water (hydrophobia), or fear of air (aerophobia).  Hallucinations.  Insomnia.  Weakness in the arms or legs.  Difficulty swallowing. Most people get sick in 1-3 months after being bitten. This often varies and may depend on the location of the bite. The infection will take less time to develop if the bite occurred closer to the head.  DIAGNOSIS  To determine if a person is infected, several tests must be performed, such as:  A skin biopsy.  A saliva test.  A lumbar puncture to remove spinal fluid so it can be examined.  Blood tests. TREATMENT  Treatment to prevent the infection from developing (post-exposure prophylaxis, PEP) is often started before knowing for sure if the person has been exposed to the rabies virus. PEP involves cleaning the wound, giving an antibody injection  (rabies immune globulin), and giving a series of rabies vaccine injections. The series of injections are usually given over a two-week period. If possible, the animal that bit the person will be observed to see if it remains healthy. If the animal has been killed, it can be sent to a state laboratory and examined to see if the animal had rabies. If a person is bitten by a domestic animal (dog, cat, or ferret) that appears healthy and can be observed to see if it remains healthy, often no further treatment is necessary other than care of the wounds caused by the animal. Rabies is often a fatal illness once the infection develops in a person. Although a few people who developed rabies have survived after experimental treatment with certain drugs, all these survivors still had severe nervous system problems after the treatment. This is why caregivers use extra caution and begin PEP treatment for people who have been bitten by animals that are possibly infected with rabies.  HOME CARE INSTRUCTIONS  If you were bitten by an unknown animal, make sure you know your caregiver's instructions for follow-up. If the animal was sent to a laboratory for examination, ask when the test results will be ready. Make sure you get the test results.  Take these steps to care for your wound:  Keep the wound clean, dry, and dressed as directed by your caregiver.  Keep the injured part elevated as much as possible.  Do not resume use of the affected area until directed.  Only take over-the-counter or prescription medicines as directed by   your caregiver.  Keep all follow-up appointments as directed by your caregiver. PREVENTION  To prevent rabies, people need to reduce their risk of having contact with infected animals.   Make sure your pets (dogs, cats, ferrets) are vaccinated against rabies. Keep these vaccinations up-to-date as directed by your veterinarian.  Supervise your pets when they are outside. Keep them away  from wild animals.  Call your local animal control services to report any stray animals. These animals may not be vaccinated.  Stay away from stray or wild animals.  Consider getting the rabies vaccine (preexposure) if you are traveling to an area where rabies is common or if your job or activities involve possible contact with wild or stray animals. Discuss this with your caregiver. Document Released: 04/18/2005 Document Revised: 01/11/2012 Document Reviewed: 11/15/2011 [employer] Patient Information 2015 [employer]. This information is not intended to replace advice given to you by your health care provider. Make sure you discuss any questions you have with your health care provider.  

## 2014-11-29 NOTE — ED Provider Notes (Signed)
[facility] Emergency Department Provider Note  ____________________________________________  Time seen: Approximately 3:36 PM  I have reviewed the triage vital signs and the nursing notes.   HISTORY  Chief Complaint Rabies Injection    HPI Desiree Mccall is a 64 y.o. female Desiree Mccall for her next rabies injection. This is day #7. Patient denies any complaints or side effects of previous injections.   Past Medical History  Diagnosis Date  . Aortic dissection 2010  . Heart attack sept. 25, 2010  . Diabetes mellitus without complication   . CHF (congestive heart failure)   . [name]     Patient Active Problem List   Diagnosis Date Noted  . Cat bite of forearm 11/22/2014    Past Surgical History  Procedure Laterality Date  . Cholecystectomy    . Cardiac surgery    . Abdominal hysterectomy    . Carotid stent  2010  . Stent place left ureter ([facility] hx)      Current Outpatient Rx  Name  Route  Sig  Dispense  Refill  . ACCU-CHEK AVIVA PLUS test strip   Subcutaneous   Inject 1 strip into the skin daily. Check blood sugars           Dispense as written.   Marland Kitchen ACCU-CHEK SOFTCLIX LANCETS lancets   Other   1 each by Other route daily. To check blood sugars         . amoxicillin-clavulanate (AUGMENTIN) 875-125 MG per tablet   Oral   Take 1 tablet by mouth 2 (two) times daily.   20 tablet   0   . atorvastatin (LIPITOR) 40 MG tablet   Oral   Take 1 tablet by mouth every evening.         . Blood Glucose Monitoring Suppl (ACCU-CHEK AVIVA PLUS) W/DEVICE KIT   Subcutaneous   Inject 1 Device into the skin daily.         Marland Kitchen buPROPion (WELLBUTRIN XL) 150 MG 24 hr tablet   Oral   Take 1 tablet by mouth every evening.         . clopidogrel (PLAVIX) 75 MG tablet   Oral   Take 1 tablet by mouth every morning.         Marland Kitchen FLUoxetine (PROZAC) 40 MG capsule   Oral   Take 2 capsules by mouth every morning.         . furosemide  (LASIX) 40 MG tablet   Oral   Take 1 tablet by mouth every morning.         . insulin NPH-regular Human (NOVOLIN 70/30) (70-30) 100 UNIT/ML injection   Subcutaneous   Inject 18 Units into the skin 2 (two) times daily with a meal.   10 mL   11   . LORazepam (ATIVAN) 0.5 MG tablet   Oral   Take 1 tablet by mouth daily as needed.         Marland Kitchen losartan (COZAAR) 50 MG tablet   Oral   Take 1 tablet by mouth every morning.         . metoprolol succinate (TOPROL-XL) 100 MG 24 hr tablet   Oral   Take 1 tablet by mouth daily.         Marland Kitchen nystatin cream (MYCOSTATIN)   Topical   Apply topically 2 (two) times daily.   30 g   0   . spironolactone (ALDACTONE) 25 MG tablet   Oral   Take 1  tablet by mouth daily.         Marland Kitchen warfarin (COUMADIN) 7.5 MG tablet   Oral   Take 1 tablet by mouth daily. TK 1 TAB PO MON, WEDS, FRI. 0.5 TAB TUES, THURS, SAT AND SUN.           Allergies Review of patient's allergies indicates no known allergies.  Family History  Problem Relation Age of Onset  . Heart failure Mother   . Heart failure Father     Social History History  Substance Use Topics  . Smoking status: Never Smoker   . Smokeless tobacco: Not on file  . Alcohol Use: No    Review of Systems Constitutional: No fever/chills Eyes: No visual changes. ENT: No sore throat. Cardiovascular: Denies chest pain. Respiratory: Denies shortness of breath. Gastrointestinal: No abdominal pain.  No nausea, no vomiting.  No diarrhea.  No constipation. Genitourinary: Negative for dysuria. Musculoskeletal: Negative for back pain. Skin: Negative for rash. Neurological: Negative for headaches, focal weakness or numbness.  10-point ROS otherwise negative.  ____________________________________________   PHYSICAL EXAM:  VITAL SIGNS: ED Triage Vitals  Enc Vitals Group     BP 11/29/14 1511 121/70 mmHg     Pulse Rate 11/29/14 1511 69     Resp 11/29/14 1511 18     Temp 11/29/14 1511  97.9 F (36.6 C)     Temp Source 11/29/14 1511 Oral     SpO2 11/29/14 1511 97 %     Weight 11/29/14 1511 244 lb (110.678 kg)     Height 11/29/14 1511 5' 7"  (1.702 m)     Head Cir --      Peak Flow --      Pain Score 11/29/14 1527 0     Pain Loc --      Pain Edu? --      Excl. in [place]? --     Constitutional: Alert and oriented. Well appearing and in no acute distress.   Cardiovascular: Normal rate, regular rhythm. Grossly normal heart sounds.  Good peripheral circulation. Respiratory: Normal respiratory effort.  No retractions. Lungs CTAB. Musculoskeletal: No lower extremity tenderness nor edema.  No joint effusions. Neurologic:  Normal speech and language. No gross focal neurologic deficits are appreciated. No gait instability. Skin:  Skin is warm, dry and intact. No rash noted. Psychiatric: Mood and affect are normal. Speech and behavior are normal.  ____________________________________________   LABS (all labs ordered are listed, but only abnormal results are displayed)  Labs Reviewed - No data to display ____________________________________________   PROCEDURES  Procedure(s) performed: None  Critical Care performed: No  ____________________________________________   INITIAL IMPRESSION / ASSESSMENT AND PLAN / ED COURSE  Pertinent labs & imaging results that were available during my care of the patient were reviewed by me and considered in my medical decision making (see chart for details).  Rabies injection. Return day #14 as directed. No other emergency medical complaints at this visit and will receive return with any worsening symptomology. ____________________________________________   FINAL CLINICAL IMPRESSION(S) / ED DIAGNOSES  Final diagnoses:  Need for rabies vaccination      Arlyss Repress, PA-C 11/29/14 1546  Desiree Prima, MD 11/30/14 2337

## 2014-12-06 ENCOUNTER — Emergency Department
Admission: EM | Admit: 2014-12-06 | Discharge: 2014-12-06 | Disposition: A | Discharge status: Home / Self Care | Payer: Medicare Other | Attending: Student | Admitting: Student

## 2014-12-06 DIAGNOSIS — Z7901 Long term (current) use of anticoagulants: Secondary | ICD-10-CM | POA: Diagnosis not present

## 2014-12-06 DIAGNOSIS — Z7902 Long term (current) use of antithrombotics/antiplatelets: Secondary | ICD-10-CM | POA: Diagnosis not present

## 2014-12-06 DIAGNOSIS — Z79899 Other long term (current) drug therapy: Secondary | ICD-10-CM | POA: Diagnosis not present

## 2014-12-06 DIAGNOSIS — Z794 Long term (current) use of insulin: Secondary | ICD-10-CM | POA: Diagnosis not present

## 2014-12-06 DIAGNOSIS — Z792 Long term (current) use of antibiotics: Secondary | ICD-10-CM | POA: Insufficient documentation

## 2014-12-06 DIAGNOSIS — E119 Type 2 diabetes mellitus without complications: Secondary | ICD-10-CM | POA: Diagnosis not present

## 2014-12-06 DIAGNOSIS — Z23 Encounter for immunization: Secondary | ICD-10-CM

## 2014-12-06 MED ORDER — RABIES VACCINE, PCEC IM SUSR
1.0000 mL | Freq: Once | INTRAMUSCULAR | Status: AC
  Administered 2014-12-06: 1 mL via INTRAMUSCULAR
  Filled 2014-12-06: qty 1

## 2014-12-06 NOTE — ED Notes (Signed)
Patient here for rabies vaccine.  

## 2014-12-06 NOTE — ED Notes (Signed)
Patient here for rabies injection. Had previous injections here. Was bitten by a cat.

## 2014-12-06 NOTE — ED Provider Notes (Signed)
[facility] Emergency Department Provider Note ____________________________________________  Time seen: 1521  I have reviewed the triage vital signs and the nursing notes.  HISTORY  Chief Complaint  Rabies Injection  HPI Desiree Mccall is a 64 y.o. female reports to the ED today for her 4th of 5 rabies injections. She was bitten by cat initially and has been treated here subsequently for her rabies series.She is without complaint of vaccine reaction in the interim.  Past Medical History  Diagnosis Date  . Aortic dissection 2010  . Heart attack sept. 25, 2010  . Diabetes mellitus without complication   . CHF (congestive heart failure)   . [name]     Patient Active Problem List   Diagnosis Date Noted  . Cat bite of forearm 11/22/2014    Past Surgical History  Procedure Laterality Date  . Cholecystectomy    . Cardiac surgery    . Abdominal hysterectomy    . Carotid stent  2010  . Stent place left ureter ([facility] hx)      Current Outpatient Rx  Name  Route  Sig  Dispense  Refill  . ACCU-CHEK AVIVA PLUS test strip   Subcutaneous   Inject 1 strip into the skin daily. Check blood sugars           Dispense as written.   Marland Kitchen ACCU-CHEK SOFTCLIX LANCETS lancets   Other   1 each by Other route daily. To check blood sugars         . amoxicillin-clavulanate (AUGMENTIN) 875-125 MG per tablet   Oral   Take 1 tablet by mouth 2 (two) times daily.   20 tablet   0   . atorvastatin (LIPITOR) 40 MG tablet   Oral   Take 1 tablet by mouth every evening.         . Blood Glucose Monitoring Suppl (ACCU-CHEK AVIVA PLUS) W/DEVICE KIT   Subcutaneous   Inject 1 Device into the skin daily.         Marland Kitchen buPROPion (WELLBUTRIN XL) 150 MG 24 hr tablet   Oral   Take 1 tablet by mouth every evening.         . clopidogrel (PLAVIX) 75 MG tablet   Oral   Take 1 tablet by mouth every morning.         Marland Kitchen FLUoxetine (PROZAC) 40 MG capsule   Oral  Take 2 capsules by mouth every morning.         . furosemide (LASIX) 40 MG tablet   Oral   Take 1 tablet by mouth every morning.         . insulin NPH-regular Human (NOVOLIN 70/30) (70-30) 100 UNIT/ML injection   Subcutaneous   Inject 18 Units into the skin 2 (two) times daily with a meal.   10 mL   11   . LORazepam (ATIVAN) 0.5 MG tablet   Oral   Take 1 tablet by mouth daily as needed.         Marland Kitchen losartan (COZAAR) 50 MG tablet   Oral   Take 1 tablet by mouth every morning.         . metoprolol succinate (TOPROL-XL) 100 MG 24 hr tablet   Oral   Take 1 tablet by mouth daily.         Marland Kitchen nystatin cream (MYCOSTATIN)   Topical   Apply topically 2 (two) times daily.   30 g   0   . spironolactone (ALDACTONE) 25  MG tablet   Oral   Take 1 tablet by mouth daily.         Marland Kitchen warfarin (COUMADIN) 7.5 MG tablet   Oral   Take 1 tablet by mouth daily. TK 1 TAB PO MON, WEDS, FRI. 0.5 TAB TUES, THURS, SAT AND SUN.          Allergies Review of patient's allergies indicates no known allergies.  Family History  Problem Relation Age of Onset  . Heart failure Mother   . Heart failure Father    Social History History  Substance Use Topics  . Smoking status: Never Smoker   . Smokeless tobacco: Not on file  . Alcohol Use: No   Review of Systems  Constitutional: Negative for fever. Eyes: Negative for visual changes. ENT: Negative for sore throat. Cardiovascular: Negative for chest pain. Respiratory: Negative for shortness of breath. Gastrointestinal: Negative for abdominal pain, vomiting and diarrhea. Genitourinary: Negative for dysuria. Musculoskeletal: Negative for back pain. Skin: Negative for rash. Neurological: Negative for headaches, focal weakness or numbness. ____________________________________________  PHYSICAL EXAM:  VITAL SIGNS: ED Triage Vitals  Enc Vitals Group     BP 12/06/14 1457 137/95 mmHg     Pulse Rate 12/06/14 1457 65     Resp --       Temp 12/06/14 1457 98.2 F (36.8 C)     Temp src --      SpO2 12/06/14 1457 98 %     Weight 12/06/14 1457 243 lb (110.224 kg)     Height 12/06/14 1457 5' 7"  (1.702 m)     Head Cir --      Peak Flow --      Pain Score --      Pain Loc --      Pain Edu? --      Excl. in [place]? --    Constitutional: Alert and oriented. Well appearing and in no distress. Eyes: Conjunctivae are normal. PERRL. Normal extraocular movements. ENT   Head: Normocephalic and atraumatic.   Nose: No congestion/rhinnorhea.   Mouth/Throat: Mucous membranes are moist.   Neck: Supple. No thyromegaly. Hematological/Lymphatic/Immunilogical: No cervical lymphadenopathy. Cardiovascular: Normal rate, regular rhythm.  Respiratory: Normal respiratory effort.  Musculoskeletal: Nontender with normal range of motion in all extremities.  Neurologic:  Normal gait without ataxia. Normal speech and language. No gross focal neurologic deficits are appreciated. Skin:  Skin is warm, dry and intact. No rash noted. Psychiatric: Mood and affect are normal. Patient exhibits appropriate insight and judgment. ____________________________________________  PROCEDURES  Rabavert 1 mg IM ____________________________________________  INITIAL IMPRESSION / ASSESSMENT AND PLAN / ED COURSE  Patient presents for rabies vaccine # 4 of 5 according to vaccine schedule card. Return to the ED as scheduled.  ____________________________________________  FINAL CLINICAL IMPRESSION(S) / ED DIAGNOSES  Final diagnoses:  Need for rabies vaccination     Melvenia Needles, PA-C 12/06/14 [place], MD 12/07/14 [phone]

## 2014-12-06 NOTE — Discharge Instructions (Signed)
Rabies Vaccine What You Need to Know WHAT IS RABIES?  Rabies is a serious disease. It is caused by a virus.  Rabies is mainly a disease of animals. Humans get rabies when they are bitten by infected animals.  At first there might not be any symptoms. But weeks, or even years after a bite, rabies can cause pain, fatigue, headaches, fever, and irritability. These are followed by seizures, hallucinations, and paralysis. Human rabies is almost always fatal.  Wild animals, especially bats, are the most common source of human rabies infection in the [place]. Skunks, raccoons, dogs, cats, coyotes, foxes, and other mammals can also transmit the disease.  Human rabies is rare in the [place]. There have been only 43 cases diagnosed since 1990. However, between 16,000 and 39,000 people are vaccinated each year as a precaution after animal bites. Also, rabies is far more common in other parts of the world, with about 40,000 to 70,000 rabies-related deaths worldwide each year. Bites from unvaccinated dogs cause most of these cases. Rabies vaccine can prevent rabies. RABIES VACCINE  Rabies vaccine is given to people at high risk of rabies to protect them if they are exposed. It can also prevent the disease if it is given to a person after they have been exposed.  Rabies vaccine is made from killed rabies virus. It cannot cause rabies. WHO SHOULD GET RABIES VACCINE AND WHEN? Preventive Vaccination (No Exposure)  People at high risk of exposure to rabies, such as veterinarians, Insurance account manager, rabies laboratory workers, spelunkers, and rabies biologics production workers should be offered rabies vaccine.  The vaccine should also be considered for:  People whose activities bring them into frequent contact with rabies virus or with possibly rabid animals.  International travelers who are likely to come in contact with animals in parts of the world where rabies is common.  The pre-exposure  schedule for rabies vaccination is 3 doses, given at the following times:  Dose 1: As appropriate.  Dose 2: 7 days after Dose 1.  Dose 3: 21 days or 28 days after Dose 1.  For laboratory workers and others who may be repeatedly exposed to rabies virus, periodic testing for immunity is recommended and booster doses should be given as needed. (Testing or booster doses are not recommended for travelers). Ask your doctor for details. Vaccination After an Exposure Anyone who has been bitten by an animal, or who otherwise may have been exposed to rabies, should clean the wound and see a doctor immediately. The doctor will determine if they need to be vaccinated. A person who is exposed and has never been vaccinated against rabies should get 4 doses of rabies vaccine: one dose right away and additional doses on the 3rd, 7th, and 14th days. They should also get another shot called Rabies Immune Globulin at the same time as the first dose.  A person who has been previously vaccinated should get 2 doses of rabies vaccine: one right away and another on the 3rd day. Rabies Immune Globulin is not needed. TELL YOUR DOCTOR IF: Talk with a doctor before getting rabies vaccine if you:  Ever had a serious (life-threatening) allergic reaction to a previous dose of rabies vaccine or to any component of the vaccine; tell your doctor if you have any severe allergies.  Have a weakened immune system because of:  HIV, AIDS, or another disease that affects the immune system.  Treatment with drugs that affect the immune system, such as steroids.  Cancer  or cancer treatment with radiation or drugs. If you have a minor illness, such as a cold, you can be vaccinated. If you are moderately or severely ill, you should probably wait until you recover before getting a routine (non-exposure) dose of rabies vaccine. If you have been exposed to rabies virus, you should get the vaccine regardless of any other illnesses you may  have. WHAT ARE THE RISKS FROM RABIES VACCINE? A vaccine, like any medicine, is capable of causing serious problems, such as severe allergic reactions. The risk of a vaccine causing serious harm, or death, is extremely small. Serious problems from rabies vaccine are very rare.  Mild problems:  Soreness, redness, swelling, or itching where the shot was given (30% to 74%).  Headache, nausea, abdominal pain, muscle aches, or dizziness (5% to 40%). Moderate problems:  Hives, pain in the joints, or fever (about 6% of booster doses).  Other nervous system disorders, such as Guillain-Barr Syndrome (GBS), have been reported after rabies vaccine, but this happens so rarely that it is not known whether they are related to the vaccine. Note: Several brands of rabies vaccine are available in the [place], and reactions may vary between brands. Your provider can give you more information about a particular brand. WHAT IF THERE IS A SERIOUS REACTION? What should I look for? Look for anything that concerns you, such as signs of a severe allergic reaction, very high fever, or behavior changes.  Signs of a severe allergic reaction can include hives, swelling of the face and throat, difficulty breathing, a fast heartbeat, dizziness, and weakness. These would start a few minutes to a few hours after the vaccination. What should I do?  If you think it is a severe allergic reaction or other emergency that cannot wait, call 911 or get the person to the nearest hospital. Otherwise, call your doctor.  Afterward, the reaction should be reported to the Vaccine Adverse Event Reporting System (VAERS). Your doctor might file this report, or you can do it yourself through the VAERS website at www.vaers.LAgents.no or by calling 1-615-500-3983. VAERS is only for reporting reactions. They do not give medical advice. HOW CAN I LEARN MORE?  Ask your doctor.  Call your local or state health department.  Contact the  Centers for Disease Control and Prevention (CDC):  Visit the CDC rabies website at wwwcrv.com CDC Rabies Vaccine VIS (02/05/08) Document Released: 02/13/2006 Document Revised: 04/04/2012 Document Reviewed: 08/08/2012 [facility] Patient Information 2015 [place], [place]. This information is not intended to replace advice given to you by your health care provider. Make sure you discuss any questions you have with your health care provider.  Return as scheduled for final rabies vaccine.

## 2014-12-20 ENCOUNTER — Encounter: Payer: Self-pay | Admitting: Emergency Medicine

## 2014-12-20 ENCOUNTER — Emergency Department
Admission: EM | Admit: 2014-12-20 | Discharge: 2014-12-20 | Discharge status: Against Medical Advice | Payer: Medicare Other | Attending: Emergency Medicine | Admitting: Emergency Medicine

## 2014-12-20 DIAGNOSIS — Z794 Long term (current) use of insulin: Secondary | ICD-10-CM | POA: Insufficient documentation

## 2014-12-20 DIAGNOSIS — Z23 Encounter for immunization: Secondary | ICD-10-CM | POA: Diagnosis present

## 2014-12-20 DIAGNOSIS — Z792 Long term (current) use of antibiotics: Secondary | ICD-10-CM | POA: Insufficient documentation

## 2014-12-20 DIAGNOSIS — Z79899 Other long term (current) drug therapy: Secondary | ICD-10-CM | POA: Insufficient documentation

## 2014-12-20 DIAGNOSIS — Z7902 Long term (current) use of antithrombotics/antiplatelets: Secondary | ICD-10-CM | POA: Insufficient documentation

## 2014-12-20 DIAGNOSIS — Z7901 Long term (current) use of anticoagulants: Secondary | ICD-10-CM | POA: Insufficient documentation

## 2014-12-20 DIAGNOSIS — E119 Type 2 diabetes mellitus without complications: Secondary | ICD-10-CM | POA: Insufficient documentation

## 2014-12-20 MED ORDER — RABIES VACCINE, PCEC IM SUSR
1.0000 mL | Freq: Once | INTRAMUSCULAR | Status: DC
  Filled 2014-12-20: qty 1

## 2014-12-20 NOTE — ED Notes (Signed)
Here for last of rabies series.  

## 2014-12-20 NOTE — ED Provider Notes (Signed)
[facility] Emergency Department Provider Note  ____________________________________________  Time seen: Approximately 11:50 AM  I have reviewed the triage vital signs and the nursing notes.   HISTORY  Chief Complaint Rabies Injection    HPI Desiree Mccall is a 64 y.o. female patient here last in the series of rabies injection secondary to being bitten by a cat. Patient states she's had no problems with the injections. Patient stated the bite on her right forearm is completely healed.   Past Medical History  Diagnosis Date  . Aortic dissection 2010  . Heart attack sept. 25, 2010  . Diabetes mellitus without complication   . CHF (congestive heart failure)   . [name]     Patient Active Problem List   Diagnosis Date Noted  . Cat bite of forearm 11/22/2014    Past Surgical History  Procedure Laterality Date  . Cholecystectomy    . Cardiac surgery    . Abdominal hysterectomy    . Carotid stent  2010  . Stent place left ureter ([facility] hx)      Current Outpatient Rx  Name  Route  Sig  Dispense  Refill  . ACCU-CHEK AVIVA PLUS test strip   Subcutaneous   Inject 1 strip into the skin daily. Check blood sugars           Dispense as written.   Marland Kitchen ACCU-CHEK SOFTCLIX LANCETS lancets   Other   1 each by Other route daily. To check blood sugars         . amoxicillin-clavulanate (AUGMENTIN) 875-125 MG per tablet   Oral   Take 1 tablet by mouth 2 (two) times daily.   20 tablet   0   . atorvastatin (LIPITOR) 40 MG tablet   Oral   Take 1 tablet by mouth every evening.         . Blood Glucose Monitoring Suppl (ACCU-CHEK AVIVA PLUS) W/DEVICE KIT   Subcutaneous   Inject 1 Device into the skin daily.         Marland Kitchen buPROPion (WELLBUTRIN XL) 150 MG 24 hr tablet   Oral   Take 1 tablet by mouth every evening.         . clopidogrel (PLAVIX) 75 MG tablet   Oral   Take 1 tablet by mouth every morning.         Marland Kitchen FLUoxetine  (PROZAC) 40 MG capsule   Oral   Take 2 capsules by mouth every morning.         . furosemide (LASIX) 40 MG tablet   Oral   Take 1 tablet by mouth every morning.         . insulin NPH-regular Human (NOVOLIN 70/30) (70-30) 100 UNIT/ML injection   Subcutaneous   Inject 18 Units into the skin 2 (two) times daily with a meal.   10 mL   11   . LORazepam (ATIVAN) 0.5 MG tablet   Oral   Take 1 tablet by mouth daily as needed.         Marland Kitchen losartan (COZAAR) 50 MG tablet   Oral   Take 1 tablet by mouth every morning.         . metoprolol succinate (TOPROL-XL) 100 MG 24 hr tablet   Oral   Take 1 tablet by mouth daily.         Marland Kitchen nystatin cream (MYCOSTATIN)   Topical   Apply topically 2 (two) times daily.   30 g  0   . spironolactone (ALDACTONE) 25 MG tablet   Oral   Take 1 tablet by mouth daily.         Marland Kitchen warfarin (COUMADIN) 7.5 MG tablet   Oral   Take 1 tablet by mouth daily. TK 1 TAB PO MON, WEDS, FRI. 0.5 TAB TUES, THURS, SAT AND SUN.           Allergies Review of patient's allergies indicates no known allergies.  Family History  Problem Relation Age of Onset  . Heart failure Mother   . Heart failure Father     Social History Social History  Substance Use Topics  . Smoking status: Never Smoker   . Smokeless tobacco: None  . Alcohol Use: Yes    Review of Systems Constitutional: No fever/chills Eyes: No visual changes. ENT: No sore throat. Cardiovascular: Denies chest pain. Respiratory: Denies shortness of breath. Gastrointestinal: No abdominal pain.  No nausea, no vomiting.  No diarrhea.  No constipation. Genitourinary: Negative for dysuria. Musculoskeletal: Negative for back pain. Skin: Negative for rash. Neurological: Negative for headaches, focal weakness or numbness. Endocrine:Diabetes 10-point ROS otherwise negative.  ____________________________________________   PHYSICAL EXAM:  VITAL SIGNS: ED Triage Vitals  Enc Vitals Group      BP 12/20/14 1102 143/64 mmHg     Pulse Rate 12/20/14 1102 68     Resp --      Temp 12/20/14 1102 98.2 F (36.8 C)     Temp Source 12/20/14 1102 Oral     SpO2 12/20/14 1102 94 %     Weight 12/20/14 1102 244 lb (110.678 kg)     Height 12/20/14 1102 $RemoveBefor'5\' 7"'[id number]$  (1.702 m)     Head Cir --      Peak Flow --      Pain Score 12/20/14 1128 0     Pain Loc --      Pain Edu? --      Excl. in [place]? --     Constitutional: Alert and oriented. Well appearing and in no acute distress. Eyes: Conjunctivae are normal. PERRL. EOMI. Head: Atraumatic. Nose: No congestion/rhinnorhea. Mouth/Throat: Mucous membranes are moist.  Oropharynx non-erythematous. Neck: No stridor.No cervical spine tenderness to palpation. Hematological/Lymphatic/Immunilogical: No cervical lymphadenopathy. Cardiovascular: Normal rate, regular rhythm. Grossly normal heart sounds.  Good peripheral circulation. Respiratory: Normal respiratory effort.  No retractions. Lungs CTAB. Gastrointestinal: Soft and nontender. No distention. No abdominal bruits. No CVA tenderness. Musculoskeletal: No lower extremity tenderness nor edema.  No joint effusions. Neurologic:  Normal speech and language. No gross focal neurologic deficits are appreciated. No gait instability. Skin:  Skin is warm, dry and intact. No rash noted. Psychiatric: Mood and affect are normal. Speech and behavior are normal.  ____________________________________________   LABS (all labs ordered are listed, but only abnormal results are displayed)  Labs Reviewed - No data to display ____________________________________________  EKG   ____________________________________________  RADIOLOGY   ____________________________________________   PROCEDURES  Procedure(s) performed: None  Critical Care performed: No  ____________________________________________   INITIAL IMPRESSION / ASSESSMENT AND PLAN / ED COURSE  Pertinent labs & imaging results that were available  during my care of the patient were reviewed by me and considered in my medical decision making (see chart for details). Patient here for lacerated series. Patient advised follow her PCP. ____________________________________________   FINAL CLINICAL IMPRESSION(S) / ED DIAGNOSES  Final diagnoses:  Rabies, need for prophylactic vaccination against      Sable Feil, PA-C 12/20/14 1156  Harvest Dark, MD  12/20/14 1444

## 2014-12-20 NOTE — ED Notes (Signed)
Pt left without getting her rabies shot

## 2015-04-08 ENCOUNTER — Encounter: Payer: Self-pay | Admitting: Emergency Medicine

## 2015-04-08 ENCOUNTER — Emergency Department: Payer: Medicare Other

## 2015-04-08 ENCOUNTER — Emergency Department
Admission: EM | Admit: 2015-04-08 | Discharge: 2015-04-08 | Disposition: A | Discharge status: Home / Self Care | Payer: Medicare Other | Attending: Emergency Medicine | Admitting: Emergency Medicine

## 2015-04-08 DIAGNOSIS — Z79899 Other long term (current) drug therapy: Secondary | ICD-10-CM | POA: Diagnosis not present

## 2015-04-08 DIAGNOSIS — R42 Dizziness and giddiness: Secondary | ICD-10-CM | POA: Insufficient documentation

## 2015-04-08 DIAGNOSIS — R062 Wheezing: Secondary | ICD-10-CM | POA: Insufficient documentation

## 2015-04-08 DIAGNOSIS — R55 Syncope and collapse: Secondary | ICD-10-CM | POA: Insufficient documentation

## 2015-04-08 DIAGNOSIS — Z794 Long term (current) use of insulin: Secondary | ICD-10-CM | POA: Insufficient documentation

## 2015-04-08 DIAGNOSIS — Z7901 Long term (current) use of anticoagulants: Secondary | ICD-10-CM | POA: Insufficient documentation

## 2015-04-08 DIAGNOSIS — R0981 Nasal congestion: Secondary | ICD-10-CM | POA: Diagnosis not present

## 2015-04-08 DIAGNOSIS — E119 Type 2 diabetes mellitus without complications: Secondary | ICD-10-CM | POA: Diagnosis not present

## 2015-04-08 DIAGNOSIS — Z7984 Long term (current) use of oral hypoglycemic drugs: Secondary | ICD-10-CM | POA: Insufficient documentation

## 2015-04-08 DIAGNOSIS — Z792 Long term (current) use of antibiotics: Secondary | ICD-10-CM | POA: Insufficient documentation

## 2015-04-08 DIAGNOSIS — R51 Headache: Secondary | ICD-10-CM | POA: Diagnosis not present

## 2015-04-08 LAB — CBC WITH DIFFERENTIAL/PLATELET
Basophils Absolute: 0 10*3/uL (ref 0–0.1)
Basophils Relative: 0 %
Eosinophils Absolute: 0.1 10*3/uL (ref 0–0.7)
Eosinophils Relative: 1 %
HCT: 36.9 % (ref 35.0–47.0)
HEMOGLOBIN: 12.1 g/dL (ref 12.0–16.0)
LYMPHS ABS: 1.7 10*3/uL (ref 1.0–3.6)
LYMPHS PCT: 17 %
MCH: 27.4 pg (ref 26.0–34.0)
MCHC: 32.7 g/dL (ref 32.0–36.0)
MCV: 83.6 fL (ref 80.0–100.0)
MONOS PCT: 7 %
Monocytes Absolute: 0.7 10*3/uL (ref 0.2–0.9)
NEUTROS PCT: 75 %
Neutro Abs: 7.3 10*3/uL — ABNORMAL HIGH (ref 1.4–6.5)
Platelets: 218 10*3/uL (ref 150–440)
RBC: 4.41 MIL/uL (ref 3.80–5.20)
RDW: 15.5 % — ABNORMAL HIGH (ref 11.5–14.5)
WBC: 9.8 10*3/uL (ref 3.6–11.0)

## 2015-04-08 LAB — BASIC METABOLIC PANEL
ANION GAP: 5 (ref 5–15)
BUN: 14 mg/dL (ref 6–20)
CO2: 26 mmol/L (ref 22–32)
Calcium: 9.2 mg/dL (ref 8.9–10.3)
Chloride: 109 mmol/L (ref 101–111)
Creatinine, Ser: 0.67 mg/dL (ref 0.44–1.00)
GFR calc non Af Amer: >60 mL/min (ref >60)
Glucose, Bld: 97 mg/dL (ref 65–99)
Potassium: 3.9 mmol/L (ref 3.5–5.1)
SODIUM: 140 mmol/L (ref 135–145)

## 2015-04-08 LAB — PROTIME-INR
INR: 1.45
Prothrombin Time: 17.7 seconds — ABNORMAL HIGH (ref 11.4–15.0)

## 2015-04-08 MED ORDER — SODIUM CHLORIDE 0.9 % IV BOLUS (SEPSIS)
1000.0000 mL | Freq: Once | INTRAVENOUS | Status: AC
  Administered 2015-04-08: 1000 mL via INTRAVENOUS

## 2015-04-08 NOTE — ED Notes (Signed)
Pt to ed from home via [facility] with syncopal episode at home. Pt was getting dressed and had some dizziness prior to passing out. Pt with hx of DM, Aortic Disection. Pt is insulin dependent at this time.  cbg per ems 120. NSR on monitor. 126/73, hr 72, 94% ra.

## 2015-04-08 NOTE — Discharge Instructions (Signed)

## 2015-04-08 NOTE — ED Provider Notes (Signed)
[facility] Emergency Department Provider Note  ____________________________________________  Time seen: 10:15 AM  I have reviewed the triage vital signs and the nursing notes.   HISTORY  Chief Complaint Loss of Consciousness    HPI Desiree Mccall is a 64 y.o. female who complains of syncope. She was getting dressed at home and it does come out of the bathroom and turned into her bedroom when she got dizzy and had a mild headache and passed out. There was noted also home with her but she estimates that this episode lasted for 4-5 minutes. She has a history of aortic dissection which was surgically repaired at [facility] in 2010. She used her morning insulin but has not eaten today so far. Has been in her usual state of health recently, eating and drinking normally. No other serious symptoms, but she has had congestion and nonproductive cough over the past week.     Past Medical History  Diagnosis Date  . Aortic dissection (HCC) 2010  . Heart attack (HCC) sept. 25, 2010  . Diabetes mellitus without complication (HCC)   . CHF (congestive heart failure) (HCC)   . [name]      Patient Active Problem List   Diagnosis Date Noted  . Cat bite of forearm 11/22/2014     Past Surgical History  Procedure Laterality Date  . Cholecystectomy    . Cardiac surgery    . Abdominal hysterectomy    . Carotid stent  2010  . Stent place left ureter ([facility] hx)       Current Outpatient Rx  Name  Route  Sig  Dispense  Refill  . atorvastatin (LIPITOR) 40 MG tablet   Oral   Take 1 tablet by mouth every evening.         Marland Kitchen. buPROPion (WELLBUTRIN XL) 150 MG 24 hr tablet   Oral   Take 1 tablet by mouth every evening.         . clopidogrel (PLAVIX) 75 MG tablet   Oral   Take 1 tablet by mouth every morning.         Marland Kitchen. FLUoxetine (PROZAC) 20 MG capsule   Oral   Take 2 capsules by mouth 2 (two) times daily.         . furosemide (LASIX) 40 MG tablet  Oral   Take 1 tablet by mouth every morning.         . insulin NPH-regular Human (NOVOLIN 70/30) (70-30) 100 UNIT/ML injection   Subcutaneous   Inject 18 Units into the skin 2 (two) times daily with a meal. Patient taking differently: Inject 18-22 Units into the skin 2 (two) times daily with a meal. 22 units in the morning and 18 units in the evening.   10 mL   11   . LORazepam (ATIVAN) 0.5 MG tablet   Oral   Take 1 tablet by mouth daily as needed.         Marland Kitchen. losartan (COZAAR) 50 MG tablet   Oral   Take 1 tablet by mouth every morning.         . metFORMIN (GLUCOPHAGE) 1000 MG tablet   Oral   Take 1 tablet by mouth 2 (two) times daily.         . metoprolol succinate (TOPROL-XL) 100 MG 24 hr tablet   Oral   Take 1 tablet by mouth daily.         Marland Kitchen. spironolactone (ALDACTONE) 25 MG tablet   Oral  Take 1 tablet by mouth daily.         Marland Kitchen warfarin (COUMADIN) 7.5 MG tablet   Oral   Take 1 tablet by mouth daily.          Marland Kitchen amoxicillin-clavulanate (AUGMENTIN) 875-125 MG per tablet   Oral   Take 1 tablet by mouth 2 (two) times daily.   20 tablet   0   . nystatin cream (MYCOSTATIN)   Topical   Apply topically 2 (two) times daily.   30 g   0      Allergies Review of patient's allergies indicates no known allergies.   Family History  Problem Relation Age of Onset  . Heart failure Mother   . Heart failure Father     Social History Social History  Substance Use Topics  . Smoking status: Never Smoker   . Smokeless tobacco: None  . Alcohol Use: Yes    Review of Systems  Constitutional:   No fever or chills. No weight changes Eyes:   No blurry vision or double vision.  ENT:   No sore throat. Positive congestion Cardiovascular:   No chest pain. Respiratory:   No dyspnea positive cough. Gastrointestinal:   Negative for abdominal pain, vomiting and diarrhea.  No BRBPR or melena. Genitourinary:   Negative for dysuria, urinary retention, bloody urine,  or difficulty urinating. Musculoskeletal:   Negative for back pain. No joint swelling or pain. Skin:   Negative for rash. Neurological:   Positive for headaches, without focal weakness or numbness. Psychiatric:  No anxiety or depression.   Endocrine:  No hot/cold intolerance, changes in energy, or sleep difficulty.  10-point ROS otherwise negative.  ____________________________________________   PHYSICAL EXAM:  VITAL SIGNS: ED Triage Vitals  Enc Vitals Group     BP 04/08/15 1019 156/87 mmHg     Pulse Rate 04/08/15 1019 69     Resp 04/08/15 1019 16     Temp 04/08/15 1019 97.9 F (36.6 C)     Temp Source 04/08/15 1019 Oral     SpO2 04/08/15 1019 95 %     Weight 04/08/15 1019 241 lb (109.317 kg)     Height 04/08/15 1019  (1.702 m)     Head Cir --      Peak Flow --      Pain Score --      Pain Loc --      Pain Edu? --      Excl. in GC? --      Constitutional:   Alert and oriented. Well appearing and in no distress. Eyes:   No scleral icterus. No conjunctival pallor. PERRL. EOMI ENT   Head:   Normocephalic and atraumatic.   Nose:   No congestion/rhinnorhea. No septal hematoma   Mouth/Throat:   MMM, no pharyngeal erythema. No peritonsillar mass. No uvula shift.   Neck:   No stridor. No SubQ emphysema. No meningismus. Hematological/Lymphatic/Immunilogical:   No cervical lymphadenopathy. Cardiovascular:   RRR. Normal and symmetric distal pulses are present in all extremities. No murmurs, rubs, or gallops. Respiratory:   Normal respiratory effort without tachypnea nor retractions. Mild expiratory wheezes diffusely Gastrointestinal:   Soft and nontender. No distention. There is no CVA tenderness.  No rebound, rigidity, or guarding. Genitourinary:   deferred Musculoskeletal:   Nontender with normal range of motion in all extremities. No joint effusions.  No lower extremity tenderness.  No edema. Neurologic:   Normal speech and language.  CN 2-10 normal. Motor  grossly  intact. No pronator drift.  Normal gait. No gross focal neurologic deficits are appreciated.  Skin:    Skin is warm, dry and intact. No rash noted.  No petechiae, purpura, or bullae. Psychiatric:   Mood and affect are normal. Speech and behavior are normal. Patient exhibits appropriate insight and judgment.  ____________________________________________    LABS (pertinent positives/negatives) (all labs ordered are listed, but only abnormal results are displayed) Labs Reviewed  CBC WITH DIFFERENTIAL/PLATELET - Abnormal; Notable for the following:    RDW 15.5 (*)    Neutro Abs 7.3 (*)    All other components within normal limits  PROTIME-[name] - Abnormal; Notable for the following:    Prothrombin Time 17.7 (*)    All other components within normal limits  BASIC METABOLIC PANEL  URINALYSIS COMPLETEWITH MICROSCOPIC ([facility] ONLY)   ____________________________________________   EKG  Interpreted by me Arm lead reversal. Sinus rhythm rate of 63 normal intervals, normal QRS and ST segments. There are T-wave inversions in V5 and V6 which are unchanged compared to 02/28/2011  ____________________________________________    RADIOLOGY  CT head unremarkable Chest x-ray unremarkable  ____________________________________________   PROCEDURES   ____________________________________________   INITIAL IMPRESSION / ASSESSMENT AND PLAN / ED COURSE  Pertinent labs & imaging results that were available during my care of the patient were reviewed by me and considered in my medical decision making (see chart for details).  Patient presents with syncope. Low suspicion of ACS PE TAD pneumothorax carditis mediastinitis pneumonia sepsis AAA stroke or intracranial hemorrhage. She has a history of ICD, but historically there is no indication that she had any sort of dysrhythmia or ICD firing. With her Coumadin use we'll check her [name] with other labs, chest x-ray for the wheezing, get CT head  for the anticoagulant use. No evidence of any serious traumatic injury.  ----------------------------------------- 12:55 PM on 04/08/2015 ----------------------------------------- Workup unremarkable. No evidence of traumatic injury on CT. X-ray and labs unremarkable. [name] is 1.4 which is subtherapeutic. We'll discharge home and have her follow-up with her cardiologist for continued monitoring of her symptoms.    ____________________________________________   FINAL CLINICAL IMPRESSION(S) / ED DIAGNOSES  Final diagnoses:  Syncope, unspecified syncope type      Sharman Cheek, MD 04/08/15 1255

## 2015-07-11 DIAGNOSIS — I708 Atherosclerosis of other arteries: Secondary | ICD-10-CM | POA: Insufficient documentation

## 2015-09-03 ENCOUNTER — Other Ambulatory Visit: Payer: Self-pay | Admitting: Preventative Medicine

## 2016-08-02 ENCOUNTER — Encounter: Payer: Self-pay | Admitting: Emergency Medicine

## 2016-08-02 ENCOUNTER — Emergency Department
Admission: EM | Admit: 2016-08-02 | Discharge: 2016-08-03 | Disposition: A | Discharge status: Home / Self Care | Payer: Medicare HMO | Attending: Emergency Medicine | Admitting: Emergency Medicine

## 2016-08-02 ENCOUNTER — Emergency Department: Payer: Medicare HMO

## 2016-08-02 DIAGNOSIS — F17200 Nicotine dependence, unspecified, uncomplicated: Secondary | ICD-10-CM | POA: Insufficient documentation

## 2016-08-02 DIAGNOSIS — R42 Dizziness and giddiness: Secondary | ICD-10-CM | POA: Diagnosis not present

## 2016-08-02 DIAGNOSIS — R112 Nausea with vomiting, unspecified: Secondary | ICD-10-CM | POA: Diagnosis not present

## 2016-08-02 DIAGNOSIS — Z79899 Other long term (current) drug therapy: Secondary | ICD-10-CM | POA: Diagnosis not present

## 2016-08-02 DIAGNOSIS — Z794 Long term (current) use of insulin: Secondary | ICD-10-CM | POA: Diagnosis not present

## 2016-08-02 DIAGNOSIS — I509 Heart failure, unspecified: Secondary | ICD-10-CM | POA: Diagnosis not present

## 2016-08-02 DIAGNOSIS — E119 Type 2 diabetes mellitus without complications: Secondary | ICD-10-CM | POA: Insufficient documentation

## 2016-08-02 DIAGNOSIS — Z7901 Long term (current) use of anticoagulants: Secondary | ICD-10-CM | POA: Insufficient documentation

## 2016-08-02 LAB — COMPREHENSIVE METABOLIC PANEL
ALT: 22 U/L (ref 14–54)
ANION GAP: 6 (ref 5–15)
AST: 22 U/L (ref 15–41)
Albumin: 3.6 g/dL (ref 3.5–5.0)
Alkaline Phosphatase: 112 U/L (ref 38–126)
BUN: 21 mg/dL — ABNORMAL HIGH (ref 6–20)
CHLORIDE: 109 mmol/L (ref 101–111)
CO2: 25 mmol/L (ref 22–32)
Calcium: 8.7 mg/dL — ABNORMAL LOW (ref 8.9–10.3)
Creatinine, Ser: 0.72 mg/dL (ref 0.44–1.00)
Glucose, Bld: 119 mg/dL — ABNORMAL HIGH (ref 65–99)
POTASSIUM: 3.7 mmol/L (ref 3.5–5.1)
SODIUM: 140 mmol/L (ref 135–145)
Total Bilirubin: 0.5 mg/dL (ref 0.3–1.2)
Total Protein: 6.9 g/dL (ref 6.5–8.1)

## 2016-08-02 LAB — CBC
HCT: 36.1 % (ref 35.0–47.0)
HEMOGLOBIN: 12.1 g/dL (ref 12.0–16.0)
MCH: 27 pg (ref 26.0–34.0)
MCHC: 33.4 g/dL (ref 32.0–36.0)
MCV: 80.8 fL (ref 80.0–100.0)
PLATELETS: 236 10*3/uL (ref 150–440)
RBC: 4.47 MIL/uL (ref 3.80–5.20)
RDW: 16.1 % — ABNORMAL HIGH (ref 11.5–14.5)
WBC: 17.1 10*3/uL — AB (ref 3.6–11.0)

## 2016-08-02 LAB — URINALYSIS, COMPLETE (UACMP) WITH MICROSCOPIC
BACTERIA UA: NONE SEEN
Bilirubin Urine: NEGATIVE
Glucose, UA: NEGATIVE mg/dL
HGB URINE DIPSTICK: NEGATIVE
Ketones, ur: 5 mg/dL — AB
LEUKOCYTES UA: NEGATIVE
Nitrite: NEGATIVE
Protein, ur: 100 mg/dL — AB
SPECIFIC GRAVITY, URINE: 1.027 (ref 1.005–1.030)
pH: 5 (ref 5.0–8.0)

## 2016-08-02 LAB — TROPONIN I

## 2016-08-02 LAB — LIPASE, BLOOD: LIPASE: 34 U/L (ref 11–51)

## 2016-08-02 NOTE — ED Provider Notes (Signed)
[facility] Emergency Department Provider Note  Time seen: 10:38 PM  I have reviewed the triage vital signs and the nursing notes.   HISTORY  Chief Complaint Nausea and Emesis    HPI Desiree Mccall is a 66 y.o. female With a past medical history CHF, diabetes, MI, presents to the emergency department with sudden onset of nausea vomiting lightheadedness and diaphoresis. According to the patient around 8 PM tonight she developed a sudden onset nausea vomiting diaphoresis with a feeling of lightheadedness like she was going to pass out. Denies any spinning sensation. Denies any vertigo sensations. Patient states similar symptoms previously with her prior myocardial infarction however with that episode she had chest pain denies any chest pain during tonight's events. Patient states the nausea and vomiting last approximately 2 hours and resolved when EMS administered Zofran just prior to arrival at the [facility] the patient denies any nausea or vomiting. Denies chest pressure which breath. Really denies any symptoms at this time. States she feels much better.  Past Medical History:  Diagnosis Date  . Aortic dissection (HCC) 2010  . CHF (congestive heart failure) (HCC)   . Diabetes mellitus without complication (HCC)   . [name]   . Heart attack sept. 25, 2010    Patient Active Problem List   Diagnosis Date Noted  . Cat bite of forearm 11/22/2014    Past Surgical History:  Procedure Laterality Date  . ABDOMINAL HYSTERECTOMY    . CARDIAC SURGERY    . CAROTID STENT  2010  . CHOLECYSTECTOMY    . STENT PLACE LEFT URETER ([facility] HX)      Prior to Admission medications   Medication Sig Start Date End Date Taking? Authorizing Provider  amoxicillin-clavulanate (AUGMENTIN) 875-125 MG per tablet Take 1 tablet by mouth 2 (two) times daily. 11/25/14   Alford Highland, MD  atorvastatin (LIPITOR) 40 MG tablet Take 1 tablet by mouth every evening. 05/08/14  05/08/15  Historical Provider, MD  buPROPion (WELLBUTRIN XL) 150 MG 24 hr tablet Take 1 tablet by mouth every evening.    Historical Provider, MD  clopidogrel (PLAVIX) 75 MG tablet Take 1 tablet by mouth every morning. 04/01/14   Historical Provider, MD  FLUoxetine (PROZAC) 20 MG capsule Take 2 capsules by mouth 2 (two) times daily. 03/12/15   Historical Provider, MD  furosemide (LASIX) 40 MG tablet Take 1 tablet by mouth every morning. 09/25/13   Historical Provider, MD  insulin NPH-regular Human (NOVOLIN 70/30) (70-30) 100 UNIT/ML injection Inject 18 Units into the skin 2 (two) times daily with a meal. Patient taking differently: Inject 18-22 Units into the skin 2 (two) times daily with a meal. 22 units in the morning and 18 units in the evening. 11/25/14   Alford Highland, MD  LORazepam (ATIVAN) 0.5 MG tablet Take 1 tablet by mouth daily as needed.    Historical Provider, MD  losartan (COZAAR) 50 MG tablet Take 1 tablet by mouth every morning. 05/08/14 05/08/15  Historical Provider, MD  metFORMIN (GLUCOPHAGE) 1000 MG tablet Take 1 tablet by mouth 2 (two) times daily. 02/05/15   Historical Provider, MD  metoprolol succinate (TOPROL-XL) 100 MG 24 hr tablet Take 1 tablet by mouth daily. 06/23/14 06/23/15  Historical Provider, MD  nystatin cream (MYCOSTATIN) Apply topically 2 (two) times daily. 11/25/14   Alford Highland, MD  spironolactone (ALDACTONE) 25 MG tablet Take 1 tablet by mouth daily. 05/16/14   Historical Provider, MD  warfarin (COUMADIN) 7.5 MG tablet Take 1  tablet by mouth daily.  07/08/10   Historical Provider, MD    [name]  Family History  Problem Relation Age of Onset  . Heart failure Mother   . Heart failure Father     Social History Social History  Substance Use Topics  . Smoking status: Current Every Day Smoker    Packs/day: 1.00  . Smokeless tobacco: Never Used  . Alcohol use Yes     Comment: occasionally    Review of Systems Constitutional: Negative for  fever. Cardiovascular: Negative for chest pain. Respiratory: Negative for shortness of breath. Gastrointestinal: Negative for abdominal pain. Denies nausea or vomiting. Neurological: Negative for headaches, focal weakness or numbness. 10-point ROS otherwise negative.  ____________________________________________   PHYSICAL EXAM:  VITAL SIGNS: ED Triage Vitals  Enc Vitals Group     BP 08/02/16 2202 (!) 153/68     Pulse Rate 08/02/16 2202 77     Resp 08/02/16 2202 16     Temp 08/02/16 2202 98.7 F (37.1 C)     Temp Source 08/02/16 2202 Oral     SpO2 08/02/16 2202 93 %     Weight 08/02/16 2202 241 lb (109.3 kg)     Height 08/02/16 2202  (1.702 m)     Head Circumference --      Peak Flow --      Pain Score 08/02/16 2201 3     Pain Loc --      Pain Edu? --      Excl. in GC? --     Constitutional: Alert and oriented. Well appearing and in no distress. Eyes: Normal exam ENT   Head: Normocephalic and atraumatic.   Mouth/Throat: Mucous membranes are moist. Cardiovascular: Normal rate, regular rhythm. No murmur Respiratory: Normal respiratory effort without tachypnea nor retractions. Breath sounds are clear Gastrointestinal: Soft and nontender. No distention. Musculoskeletal: Nontender with normal range of motion in all extremities. Neurologic:  Normal speech and language. No gross focal neurologic deficits Skin:  Skin is warm, dry and intact.  Psychiatric: Mood and affect are normal.  ____________________________________________    EKG  EKG reviewed and interpreted by myself shows normal sinus rhythm at 75 bpm, narrow QRS, normal axis, largely normal intervals with nonspecific ST changes  ____________________________________________    RADIOLOGY  CT head negative Chest x-ray negative  ____________________________________________   INITIAL IMPRESSION / ASSESSMENT AND PLAN / ED COURSE  Pertinent labs & imaging results that were available during my care  of the patient were reviewed by me and considered in my medical decision making (see chart for details).  patient present emergency department with sudden onset nausea vomiting diaphoresis and lightheadedness like she was going to pass out. Patient states this has happened to her multiple times in the past. Now onset nausea and vomiting but her symptoms usually resolve within several minutes after vomiting. Patient states tonight her symptoms fail to resolve and she continued to feel nauseated and does receive Zofran by EMS. Patient states she had a heart attack several years ago in which she also was nauseated with vomiting and diaphoresis which also concerns her although she states at that time showed severe chest pain and denies any chest pain at any point's episode. Patient denies any headache, confusion, slurred speech. Denies focal weakness or numbness. Overall the patient appears very well currently with a normal physical and neurological examination.   Patient's workup is largely nonrevealing. She does have a moderate leukocytosis however this could just be a result  of the patient's nausea and vomiting itself. The rest of the patient's labs including chemistry and urinalysis are normal. Patient CT scan of head is normal, chest x-ray is normal. Given the patient's acute onset 2 hours before arrival we will repeat a troponin 3 hours after initial blood work was drawn. The patient continues to feel well, asymptomatic with a repeat second troponin I believe she is safe for discharge home. Patient agreeable to this plan.  Patient care signed out to Dr. Manson Passey.   ____________________________________________   FINAL CLINICAL IMPRESSION(S) / ED DIAGNOSES  Nausea and vomiting    Minna Antis, MD 08/02/16 [phone]

## 2016-08-02 NOTE — ED Triage Notes (Signed)
[facility] Reports sudden onset of dizziness, nausea and vomiting. Denies fever or previous illness. Denies CP or SOB.  Patient has history of MI and aortic dissection. A&O x4.

## 2016-08-02 NOTE — ED Notes (Signed)
Assisted patient to ambulate to restroom. Patient denies dizziness or nausea at this time.

## 2016-08-03 LAB — TROPONIN I: Troponin I: <0.03 ng/mL (ref <0.03)

## 2016-10-27 ENCOUNTER — Other Ambulatory Visit: Payer: Self-pay | Admitting: Family Medicine

## 2016-10-27 DIAGNOSIS — Z1231 Encounter for screening mammogram for malignant neoplasm of breast: Secondary | ICD-10-CM

## 2017-05-04 DIAGNOSIS — G4733 Obstructive sleep apnea (adult) (pediatric): Secondary | ICD-10-CM | POA: Insufficient documentation

## 2017-05-04 DIAGNOSIS — Z9989 Dependence on other enabling machines and devices: Secondary | ICD-10-CM

## 2017-05-04 DIAGNOSIS — I6522 Occlusion and stenosis of left carotid artery: Secondary | ICD-10-CM | POA: Insufficient documentation

## 2017-05-30 DIAGNOSIS — Z8659 Personal history of other mental and behavioral disorders: Secondary | ICD-10-CM | POA: Insufficient documentation

## 2017-05-30 DIAGNOSIS — I252 Old myocardial infarction: Secondary | ICD-10-CM | POA: Insufficient documentation

## 2017-05-30 DIAGNOSIS — F17200 Nicotine dependence, unspecified, uncomplicated: Secondary | ICD-10-CM | POA: Insufficient documentation

## 2017-07-19 DIAGNOSIS — Z122 Encounter for screening for malignant neoplasm of respiratory organs: Secondary | ICD-10-CM | POA: Insufficient documentation

## 2018-04-16 ENCOUNTER — Other Ambulatory Visit
Admission: RE | Admit: 2018-04-16 | Discharge: 2018-04-16 | Disposition: A | Discharge status: Home / Self Care | Payer: Medicare HMO | Source: Ambulatory Visit | Attending: Pulmonary Disease | Admitting: Pulmonary Disease

## 2018-04-16 ENCOUNTER — Encounter: Payer: Self-pay | Admitting: Pulmonary Disease

## 2018-04-16 ENCOUNTER — Ambulatory Visit: Payer: Medicare Other | Admitting: Pulmonary Disease

## 2018-04-16 VITALS — BP 124/70 | HR 72 | Ht 67.0 in | Wt 233.4 lb

## 2018-04-16 DIAGNOSIS — R05 Cough: Secondary | ICD-10-CM

## 2018-04-16 DIAGNOSIS — Z23 Encounter for immunization: Secondary | ICD-10-CM

## 2018-04-16 DIAGNOSIS — E669 Obesity, unspecified: Secondary | ICD-10-CM | POA: Diagnosis not present

## 2018-04-16 DIAGNOSIS — J3089 Other allergic rhinitis: Secondary | ICD-10-CM | POA: Insufficient documentation

## 2018-04-16 DIAGNOSIS — R059 Cough, unspecified: Secondary | ICD-10-CM

## 2018-04-16 LAB — SPIROMETRY WITH GRAPH

## 2018-04-16 MED ORDER — MONTELUKAST SODIUM 10 MG PO TABS: 10.0000 mg | ORAL_TABLET | Freq: Every day | ORAL | 2 refills | Status: DC

## 2018-04-16 MED ORDER — FLUTICASONE FUROATE 100 MCG/ACT IN AEPB: 1.0000 | INHALATION_SPRAY | Freq: Every day | RESPIRATORY_TRACT | 3 refills | Status: DC

## 2018-04-16 NOTE — Patient Instructions (Addendum)
1.  You will be prescribed montelukast (Singulair) milligram tablet, 1 tablet daily.  2.  You will also be started on Arnuity Ellipta 100 mcg 1 puff inhaled daily.  This rinse your mouth well after use.  3.  You will have allergy test performed.  4.  We will see you in follow-up in 4 to 6 weeks time.  Please call prior to that should any new difficulties arise.

## 2018-04-16 NOTE — Progress Notes (Signed)
Subjective:    Patient ID: Desiree Mccall, female    DOB: 1950-09-29, 67 y.o.   MRN: [id number]  HPI  The patient is a 67 year old current smoker who presents for evaluation of a dry nonproductive cough for 6 weeks duration.  The patient presents as a self-referral however her primary care physician is Dr. Beverely Low.  She states that she has difficulties with "allergies" particularly to pollen usually worse in the spring and fall of the year.  She states that 6 weeks ago she was working in the yard when she got exposed to crushed plant material while her neighbor was reaming bushes.  Then she has had worse postnasal drip, nasal congestion and nonproductive cough.  She does not describe any indigestion or heartburn symptoms.  She states that she was seen by her primary care physician and treated twice with antibiotics and with inhaler (albuterol) with no improvement on her cough.  She was given prednisone and this helped the cough however as soon as she discontinued the prednisone her cough came back.  She has not had any orthopnea or paroxysmal nocturnal dyspnea.  He does have CPAP and wears it religiously at night.  Is followed by Dr. Andee Poles.  He does not describe any fevers, chills or sweats.  No hemoptysis.  No purulent sputum production.  She requires flu vaccine today.  She has inconsistent with lung cancer low-dose CT screening.  The patient has smoked half to 1 pack/day since age 89.  She had a few years of a hiatus but for the most part has been consistently smoking.  She has an extensive past medical and surgical history that has been reviewed thoroughly.  This includes aortic aneurysm dissection with repair.  She has issues with ischemic cardiomyopathy and has an ICD in place.  No current evidence of decompensation.  She is not on ACE inhibitors.   Review of Systems  Constitutional: Negative.   HENT: Positive for congestion, postnasal drip and sinus pressure.   Eyes: Negative.     Respiratory: Positive for cough.   Cardiovascular: Negative.   Gastrointestinal: Negative.   Endocrine: Negative.   Genitourinary: Negative.   Musculoskeletal: Negative.   Skin: Negative.   Allergic/Immunologic: Positive for environmental allergies.  Neurological: Negative.   Hematological: Negative.   Psychiatric/Behavioral: Negative.   All other systems reviewed and are negative.      Objective:   Physical Exam Vitals signs and nursing note reviewed.  Constitutional:      General: She is not in acute distress.    Appearance: She is obese. She is not ill-appearing or diaphoretic.  HENT:     Head: Normocephalic and atraumatic.     Mouth/Throat:     Mouth: Mucous membranes are moist.     Dentition: Normal dentition.     Tongue: No lesions.     Comments: Clear postnasal drip noted. Eyes:     General: No scleral icterus.    Conjunctiva/sclera: Conjunctivae normal.     Pupils: Pupils are equal, round, and reactive to light.  Neck:     Musculoskeletal: Neck supple.     Thyroid: No thyromegaly.     Vascular: No JVD.     Trachea: Trachea and phonation normal.  Cardiovascular:     Rate and Rhythm: Normal rate and regular rhythm.     Heart sounds: Murmur present. Crescendo  systolic murmur present with a grade of 3/6. No friction rub. No gallop.   Pulmonary:     Effort:  Pulmonary effort is normal. No respiratory distress.     Breath sounds: Normal breath sounds.  Abdominal:     General: There is no distension.     Palpations: Abdomen is soft.     Comments: Obese  Musculoskeletal: Normal range of motion.        General: No swelling.     Right lower leg: No edema.     Left lower leg: No edema.  Lymphadenopathy:     Cervical: No cervical adenopathy.  Neurological:     General: No focal deficit present.     Mental Status: She is alert and oriented to person, place, and time.  Psychiatric:        Mood and Affect: Mood normal.        Behavior: Behavior normal.      Biometry was performed today and shows no overt obstruction, restriction cannot be excluded however the patient's history of prior sternotomy would predispose to a restrictive defect.     Assessment & Plan:    1.  Cough: Likely multifactorial but suspect that the majority of the issues are due to postnasal drip i.e. upper airway cough syndrome.  We will address issues with rhinitis as noted below.  In addition given the fact that she responded well to prednisone there is a possibility that she may have some airways reactivity and/or eosinophilic bronchitis.  For this reason we will give a trial of Arnuity Ellipta 100 mcg, 1 inhalation daily.  2.  Perennial allergic rhinitis with significant postnasal drip: We will try montelukast 10 mg daily.  Patient was also instructed on proper nasal hygiene.  3.  Patient needs to be updated with regards to flu vaccine, this will be given to her today.  4.  Obesity with BMI of 36.6, recommend weight loss.  Follow-up will be in 4 to 6 weeks time she is to contact us prior to that time should any new difficulties arise.

## 2018-04-18 LAB — ALLERGENS W/TOTAL IGE AREA 2
Alternaria Alternata IgE: <0.1 kU/L
Cedar, Mountain IgE: <0.1 kU/L
Common Silver Birch IgE: <0.1 kU/L
D Farinae IgE: <0.1 kU/L
D Pteronyssinus IgE: <0.1 kU/L
IGE (IMMUNOGLOBULIN E), SERUM: 35 [IU]/mL (ref 6–495)
Maple/Box Elder IgE: <0.1 kU/L
Mouse Urine IgE: <0.1 kU/L
Oak, White IgE: <0.1 kU/L
Pecan, Hickory IgE: <0.1 kU/L
Pigweed, Rough IgE: <0.1 kU/L
Sheep Sorrel IgE Qn: <0.1 kU/L
Timothy Grass IgE: <0.1 kU/L
White Mulberry IgE: <0.1 kU/L

## 2018-05-02 DIAGNOSIS — I469 Cardiac arrest, cause unspecified: Secondary | ICD-10-CM

## 2018-05-02 HISTORY — DX: Cardiac arrest, cause unspecified: I46.9

## 2018-05-11 ENCOUNTER — Other Ambulatory Visit: Payer: Self-pay | Admitting: Family Medicine

## 2018-05-11 DIAGNOSIS — Z13820 Encounter for screening for osteoporosis: Secondary | ICD-10-CM

## 2018-05-11 DIAGNOSIS — Z1231 Encounter for screening mammogram for malignant neoplasm of breast: Secondary | ICD-10-CM

## 2018-05-27 ENCOUNTER — Inpatient Hospital Stay
Admission: EM | Admit: 2018-05-27 | Discharge: 2018-06-05 | DRG: 286 | Disposition: A | Discharge status: Other Acute Inpatient Hospital | Payer: Medicare HMO | Attending: Pulmonary Disease | Admitting: Pulmonary Disease

## 2018-05-27 ENCOUNTER — Emergency Department: Payer: Medicare HMO

## 2018-05-27 ENCOUNTER — Other Ambulatory Visit: Payer: Self-pay

## 2018-05-27 ENCOUNTER — Encounter: Payer: Self-pay | Admitting: Radiology

## 2018-05-27 DIAGNOSIS — Z4659 Encounter for fitting and adjustment of other gastrointestinal appliance and device: Secondary | ICD-10-CM

## 2018-05-27 DIAGNOSIS — I472 Ventricular tachycardia: Secondary | ICD-10-CM | POA: Diagnosis present

## 2018-05-27 DIAGNOSIS — I5023 Acute on chronic systolic (congestive) heart failure: Secondary | ICD-10-CM | POA: Diagnosis present

## 2018-05-27 DIAGNOSIS — Z7901 Long term (current) use of anticoagulants: Secondary | ICD-10-CM

## 2018-05-27 DIAGNOSIS — R402212 Coma scale, best verbal response, none, at arrival to emergency department: Secondary | ICD-10-CM | POA: Diagnosis present

## 2018-05-27 DIAGNOSIS — Z6836 Body mass index (BMI) 36.0-36.9, adult: Secondary | ICD-10-CM

## 2018-05-27 DIAGNOSIS — I469 Cardiac arrest, cause unspecified: Secondary | ICD-10-CM | POA: Diagnosis not present

## 2018-05-27 DIAGNOSIS — Z95810 Presence of automatic (implantable) cardiac defibrillator: Secondary | ICD-10-CM

## 2018-05-27 DIAGNOSIS — Z8679 Personal history of other diseases of the circulatory system: Secondary | ICD-10-CM

## 2018-05-27 DIAGNOSIS — Z8249 Family history of ischemic heart disease and other diseases of the circulatory system: Secondary | ICD-10-CM

## 2018-05-27 DIAGNOSIS — E785 Hyperlipidemia, unspecified: Secondary | ICD-10-CM | POA: Diagnosis present

## 2018-05-27 DIAGNOSIS — Z978 Presence of other specified devices: Secondary | ICD-10-CM

## 2018-05-27 DIAGNOSIS — R570 Cardiogenic shock: Secondary | ICD-10-CM | POA: Diagnosis not present

## 2018-05-27 DIAGNOSIS — T502X5A Adverse effect of carbonic-anhydrase inhibitors, benzothiadiazides and other diuretics, initial encounter: Secondary | ICD-10-CM | POA: Diagnosis not present

## 2018-05-27 DIAGNOSIS — F17210 Nicotine dependence, cigarettes, uncomplicated: Secondary | ICD-10-CM | POA: Diagnosis present

## 2018-05-27 DIAGNOSIS — R680 Hypothermia, not associated with low environmental temperature: Secondary | ICD-10-CM | POA: Diagnosis present

## 2018-05-27 DIAGNOSIS — J9601 Acute respiratory failure with hypoxia: Secondary | ICD-10-CM | POA: Diagnosis present

## 2018-05-27 DIAGNOSIS — J690 Pneumonitis due to inhalation of food and vomit: Secondary | ICD-10-CM | POA: Diagnosis present

## 2018-05-27 DIAGNOSIS — Z7902 Long term (current) use of antithrombotics/antiplatelets: Secondary | ICD-10-CM

## 2018-05-27 DIAGNOSIS — I2510 Atherosclerotic heart disease of native coronary artery without angina pectoris: Secondary | ICD-10-CM | POA: Diagnosis present

## 2018-05-27 DIAGNOSIS — Z452 Encounter for adjustment and management of vascular access device: Secondary | ICD-10-CM

## 2018-05-27 DIAGNOSIS — I110 Hypertensive heart disease with heart failure: Secondary | ICD-10-CM | POA: Diagnosis present

## 2018-05-27 DIAGNOSIS — I509 Heart failure, unspecified: Secondary | ICD-10-CM

## 2018-05-27 DIAGNOSIS — E119 Type 2 diabetes mellitus without complications: Secondary | ICD-10-CM

## 2018-05-27 DIAGNOSIS — I4891 Unspecified atrial fibrillation: Secondary | ICD-10-CM | POA: Diagnosis present

## 2018-05-27 DIAGNOSIS — Z7982 Long term (current) use of aspirin: Secondary | ICD-10-CM

## 2018-05-27 DIAGNOSIS — J9690 Respiratory failure, unspecified, unspecified whether with hypoxia or hypercapnia: Secondary | ICD-10-CM

## 2018-05-27 DIAGNOSIS — Z9049 Acquired absence of other specified parts of digestive tract: Secondary | ICD-10-CM

## 2018-05-27 DIAGNOSIS — E876 Hypokalemia: Secondary | ICD-10-CM | POA: Diagnosis present

## 2018-05-27 DIAGNOSIS — R6520 Severe sepsis without septic shock: Secondary | ICD-10-CM

## 2018-05-27 DIAGNOSIS — J9600 Acute respiratory failure, unspecified whether with hypoxia or hypercapnia: Secondary | ICD-10-CM

## 2018-05-27 DIAGNOSIS — Z79899 Other long term (current) drug therapy: Secondary | ICD-10-CM

## 2018-05-27 DIAGNOSIS — X58XXXA Exposure to other specified factors, initial encounter: Secondary | ICD-10-CM | POA: Diagnosis present

## 2018-05-27 DIAGNOSIS — Z90710 Acquired absence of both cervix and uterus: Secondary | ICD-10-CM

## 2018-05-27 DIAGNOSIS — I5022 Chronic systolic (congestive) heart failure: Secondary | ICD-10-CM | POA: Diagnosis present

## 2018-05-27 DIAGNOSIS — R402312 Coma scale, best motor response, none, at arrival to emergency department: Secondary | ICD-10-CM | POA: Diagnosis present

## 2018-05-27 DIAGNOSIS — E860 Dehydration: Secondary | ICD-10-CM | POA: Diagnosis not present

## 2018-05-27 DIAGNOSIS — R402112 Coma scale, eyes open, never, at arrival to emergency department: Secondary | ICD-10-CM | POA: Diagnosis present

## 2018-05-27 DIAGNOSIS — D649 Anemia, unspecified: Secondary | ICD-10-CM | POA: Diagnosis present

## 2018-05-27 DIAGNOSIS — E872 Acidosis: Secondary | ICD-10-CM | POA: Diagnosis present

## 2018-05-27 DIAGNOSIS — G931 Anoxic brain damage, not elsewhere classified: Secondary | ICD-10-CM | POA: Diagnosis present

## 2018-05-27 DIAGNOSIS — A419 Sepsis, unspecified organism: Secondary | ICD-10-CM

## 2018-05-27 DIAGNOSIS — R451 Restlessness and agitation: Secondary | ICD-10-CM | POA: Diagnosis not present

## 2018-05-27 DIAGNOSIS — J9602 Acute respiratory failure with hypercapnia: Secondary | ICD-10-CM | POA: Diagnosis present

## 2018-05-27 DIAGNOSIS — Z794 Long term (current) use of insulin: Secondary | ICD-10-CM

## 2018-05-27 DIAGNOSIS — J189 Pneumonia, unspecified organism: Secondary | ICD-10-CM

## 2018-05-27 DIAGNOSIS — Z66 Do not resuscitate: Secondary | ICD-10-CM | POA: Diagnosis not present

## 2018-05-27 DIAGNOSIS — Z8673 Personal history of transient ischemic attack (TIA), and cerebral infarction without residual deficits: Secondary | ICD-10-CM

## 2018-05-27 DIAGNOSIS — K5900 Constipation, unspecified: Secondary | ICD-10-CM | POA: Diagnosis present

## 2018-05-27 DIAGNOSIS — I6523 Occlusion and stenosis of bilateral carotid arteries: Secondary | ICD-10-CM | POA: Diagnosis present

## 2018-05-27 DIAGNOSIS — I4901 Ventricular fibrillation: Secondary | ICD-10-CM | POA: Diagnosis present

## 2018-05-27 DIAGNOSIS — E1165 Type 2 diabetes mellitus with hyperglycemia: Secondary | ICD-10-CM | POA: Diagnosis present

## 2018-05-27 DIAGNOSIS — I252 Old myocardial infarction: Secondary | ICD-10-CM

## 2018-05-27 DIAGNOSIS — N170 Acute kidney failure with tubular necrosis: Secondary | ICD-10-CM | POA: Diagnosis present

## 2018-05-27 DIAGNOSIS — S2243XA Multiple fractures of ribs, bilateral, initial encounter for closed fracture: Secondary | ICD-10-CM | POA: Diagnosis present

## 2018-05-27 LAB — BLOOD GAS, ARTERIAL
Acid-base deficit: 14.3 mmol/L — ABNORMAL HIGH (ref 0.0–2.0)
Bicarbonate: 17.2 mmol/L — ABNORMAL LOW (ref 20.0–28.0)
FIO2: 1
MECHVT: 500 mL
Mechanical Rate: 20
O2 Saturation: 98.6 %
PEEP: 5 cmH2O
Patient temperature: 37
pCO2 arterial: 65 mmHg — ABNORMAL HIGH (ref 32.0–48.0)
pH, Arterial: 7.03 — CL (ref 7.350–7.450)
pO2, Arterial: 162 mmHg — ABNORMAL HIGH (ref 83.0–108.0)

## 2018-05-27 LAB — COMPREHENSIVE METABOLIC PANEL
ALT: 135 U/L — ABNORMAL HIGH (ref 0–44)
ANION GAP: 16 — AB (ref 5–15)
AST: 229 U/L — ABNORMAL HIGH (ref 15–41)
Albumin: 3.5 g/dL (ref 3.5–5.0)
Alkaline Phosphatase: 155 U/L — ABNORMAL HIGH (ref 38–126)
BILIRUBIN TOTAL: 0.7 mg/dL (ref 0.3–1.2)
BUN: 18 mg/dL (ref 8–23)
CALCIUM: 8.6 mg/dL — AB (ref 8.9–10.3)
CO2: 20 mmol/L — ABNORMAL LOW (ref 22–32)
Chloride: 106 mmol/L (ref 98–111)
Creatinine, Ser: 0.96 mg/dL (ref 0.44–1.00)
Glucose, Bld: 343 mg/dL — ABNORMAL HIGH (ref 70–99)
Potassium: 4 mmol/L (ref 3.5–5.1)
Sodium: 142 mmol/L (ref 135–145)
Total Protein: 6.8 g/dL (ref 6.5–8.1)

## 2018-05-27 LAB — BRAIN NATRIURETIC PEPTIDE: B NATRIURETIC PEPTIDE 5: 1217 pg/mL — AB (ref 0.0–100.0)

## 2018-05-27 LAB — CBC WITH DIFFERENTIAL/PLATELET
Abs Immature Granulocytes: 0.4 10*3/uL — ABNORMAL HIGH (ref 0.00–0.07)
BASOS ABS: 0.1 10*3/uL (ref 0.0–0.1)
BASOS PCT: 0 %
Eosinophils Absolute: 0.1 10*3/uL (ref 0.0–0.5)
Eosinophils Relative: 1 %
HCT: 41.8 % (ref 36.0–46.0)
Hemoglobin: 11.9 g/dL — ABNORMAL LOW (ref 12.0–15.0)
IMMATURE GRANULOCYTES: 3 %
LYMPHS PCT: 56 %
Lymphs Abs: 9 10*3/uL — ABNORMAL HIGH (ref 0.7–4.0)
MCH: 26.5 pg (ref 26.0–34.0)
MCHC: 28.5 g/dL — AB (ref 30.0–36.0)
MCV: 93.1 fL (ref 80.0–100.0)
Monocytes Absolute: 0.8 10*3/uL (ref 0.1–1.0)
Monocytes Relative: 5 %
NRBC: 0.2 % (ref 0.0–0.2)
Neutro Abs: 5.4 10*3/uL (ref 1.7–7.7)
Neutrophils Relative %: 35 %
Platelets: 192 10*3/uL (ref 150–400)
RBC: 4.49 MIL/uL (ref 3.87–5.11)
RDW: 15.9 % — AB (ref 11.5–15.5)
Smear Review: NORMAL
WBC: 15.8 10*3/uL — AB (ref 4.0–10.5)

## 2018-05-27 LAB — LACTIC ACID, PLASMA: Lactic Acid, Venous: >11 mmol/L (ref 0.5–1.9)

## 2018-05-27 LAB — INFLUENZA PANEL BY PCR (TYPE A & B)
Influenza A By PCR: NEGATIVE
Influenza B By PCR: NEGATIVE

## 2018-05-27 LAB — PROTIME-INR
INR: 2.21
PROTHROMBIN TIME: 24.2 s — AB (ref 11.4–15.2)

## 2018-05-27 LAB — TROPONIN I: Troponin I: 0.04 ng/mL (ref <0.03)

## 2018-05-27 MED ORDER — FENTANYL CITRATE (PF) 100 MCG/2ML IJ SOLN
INTRAMUSCULAR | Status: AC | PRN
  Administered 2018-05-27: 100 ug via INTRAVENOUS
  Administered 2018-05-28: via INTRAVENOUS

## 2018-05-27 MED ORDER — IOPAMIDOL (ISOVUE-370) INJECTION 76%
125.0000 mL | Freq: Once | INTRAVENOUS | Status: AC | PRN
  Administered 2018-05-27: 125 mL via INTRAVENOUS

## 2018-05-27 MED ORDER — SODIUM CHLORIDE 0.9 % IV SOLN
1.0000 g | Freq: Once | INTRAVENOUS | Status: AC
  Administered 2018-05-27: 1 g via INTRAVENOUS
  Filled 2018-05-27: qty 1

## 2018-05-27 MED ORDER — DEXMEDETOMIDINE HCL IN NACL 400 MCG/100ML IV SOLN
0.4000 ug/kg/h | INTRAVENOUS | Status: DC
  Administered 2018-05-27: 0.5 ug/kg/h via INTRAVENOUS
  Administered 2018-05-27: 0.6 ug/kg/h via INTRAVENOUS
  Administered 2018-05-27: 0.4 ug/kg/h via INTRAVENOUS
  Administered 2018-05-28: 0.8 ug/kg/h via INTRAVENOUS
  Administered 2018-05-28: 0.5 ug/kg/h via INTRAVENOUS
  Administered 2018-05-28: 0.8 ug/kg/h via INTRAVENOUS
  Filled 2018-05-27: qty 100

## 2018-05-27 MED ORDER — VANCOMYCIN HCL IN DEXTROSE 1-5 GM/200ML-% IV SOLN
1000.0000 mg | Freq: Once | INTRAVENOUS | Status: AC
  Administered 2018-05-27: 1000 mg via INTRAVENOUS
  Filled 2018-05-27: qty 200

## 2018-05-27 MED ORDER — ROCURONIUM BROMIDE 50 MG/5ML IV SOLN
INTRAVENOUS | Status: AC | PRN
  Administered 2018-05-27: 100 mg via INTRAVENOUS

## 2018-05-27 MED ORDER — FUROSEMIDE 10 MG/ML IJ SOLN
40.0000 mg | Freq: Once | INTRAMUSCULAR | Status: AC
  Administered 2018-05-27: 40 mg via INTRAVENOUS
  Filled 2018-05-27: qty 4

## 2018-05-27 MED ORDER — ETOMIDATE 2 MG/ML IV SOLN
INTRAVENOUS | Status: AC | PRN
  Administered 2018-05-27: 30 mg via INTRAVENOUS

## 2018-05-27 MED ORDER — FENTANYL CITRATE (PF) 100 MCG/2ML IJ SOLN
INTRAMUSCULAR | Status: AC
  Filled 2018-05-27: qty 2

## 2018-05-27 NOTE — ED Provider Notes (Signed)
[facility] Emergency Department Provider Note  ____________________________________________  Time seen: Approximately 9:52 PM  I have reviewed the triage vital signs and the nursing notes.   HISTORY  Chief Complaint Cardiac Arrest   HPI Desiree Mccall is a 68 y.o. female with a history of V. fib status post AICD, CHF with a EF of 30 to 35%, diabetes, aortic dissection status post repair who presents for cardiac arrest.  EMS was called to the house for difficulty breathing.  When they arrived the patient opened the door and told him that she could not breathe.  She then became cyanotic and collapsed to the ground.  She had no pulse.  Initial rhythm of PEA arrest.  Patient received a round of epinephrine and CPR with return of spontaneous circulation.  Patient had no neurological exam.  Agonal breathing on arrival  Past Medical History:  Diagnosis Date  . Aortic dissection (HCC) 2010  . CHF (congestive heart failure) (HCC)   . Diabetes mellitus without complication (HCC)   . [name]   . Heart attack (HCC) sept. 25, 2010    Patient Active Problem List   Diagnosis Date Noted  . Screening for malignant neoplasm of respiratory organ 07/19/2017  . H/O acute myocardial infarction 05/30/2017  . H/O: depression 05/30/2017  . Severe tobacco use disorder 05/30/2017  . Carotid stenosis, asymptomatic, left 05/04/2017  . OSA on CPAP 05/04/2017  . 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 Cat bite of forearm 11/22/2014  . Diabetes mellitus type 2, noninsulin dependent (HCC) 05/08/2014  . Syncope 04/10/2013  . VF (ventricular fibrillation) (HCC) 04/10/2013  . Automatic implantable cardioverter-defibrillator in situ 07/13/2012  . Aortic dissection (HCC) 11/22/2011  . CAD (coronary artery disease) 11/22/2011  . Dyslipidemia 11/22/2011  . History of stroke 11/22/2011  . Ischemic cardiomyopathy 11/22/2011  . Lumbar disc disease 11/22/2011  . PAD  (peripheral artery disease) (HCC) 11/22/2011  . Pancreatitis due to common bile duct stone 11/22/2011    Past Surgical History:  Procedure Laterality Date  . ABDOMINAL HYSTERECTOMY    . CARDIAC SURGERY    . CAROTID STENT  2010  . CHOLECYSTECTOMY    . STENT PLACE LEFT URETER ([facility] HX)      Prior to Admission medications   Medication Sig Start Date End Date Taking? Authorizing Provider  atorvastatin (LIPITOR) 40 MG tablet Take 1 tablet by mouth every evening. 05/08/14 05/08/15  [provider]  buPROPion (WELLBUTRIN XL) 150 MG 24 hr tablet Take 1 tablet by mouth every evening.    [provider]  clopidogrel (PLAVIX) 75 MG tablet Take 1 tablet by mouth every morning. 04/01/14   [provider]  FLUoxetine (PROZAC) 20 MG capsule Take 2 capsules by mouth 2 (two) times daily. 03/12/15   [provider]  Fluticasone Furoate (ARNUITY ELLIPTA) 100 MCG/ACT AEPB Inhale 1 puff into the lungs daily. 04/16/18   Salena SanerGonzalez, Carmen L, MD  furosemide (LASIX) 40 MG tablet Take 1 tablet by mouth every morning. 09/25/13   [provider]  insulin detemir (LEVEMIR) 100 UNIT/ML injection Inject into the skin 2 (two) times daily.    [provider]  losartan (COZAAR) 50 MG tablet Take 1 tablet by mouth every morning. 05/08/14 05/08/15  [provider]  metFORMIN (GLUCOPHAGE) 1000 MG tablet Take 1 tablet by mouth 2 (two) times daily. 02/05/15   [provider]  metoprolol succinate (TOPROL-XL) 100 MG 24 hr tablet Take 1 tablet by mouth  daily. 06/23/14 06/23/15  [provider]  montelukast (SINGULAIR) 10 MG tablet Take 1 tablet (10 mg total) by mouth daily. 04/16/18 07/15/18  Salena Saner, MD  spironolactone (ALDACTONE) 25 MG tablet Take 1 tablet by mouth daily. 05/16/14   [provider]  warfarin (COUMADIN) 7.5 MG tablet Take 1 tablet by mouth daily.  07/08/10   [provider]    Allergies Patient has no known  allergies.  Family History  Problem Relation Age of Onset  . Heart failure Mother   . Heart failure Father     Social History Social History   Tobacco Use  . Smoking status: Current Every Day Smoker    Packs/day: 0.50    Types: Cigarettes    Start date: 2012  . Smokeless tobacco: Never Used  Substance Use Topics  . Alcohol use: Yes    Comment: occasionally  . Drug use: No    Review of Systems Respiratory: + shortness of breath. Cardiac: + cardiac arrest ____________________________________________   PHYSICAL EXAM:  VITAL SIGNS: ED Triage Vitals  Enc Vitals Group     BP 05/27/18 2115 (!) 152/115     Pulse Rate 05/27/18 2121 (!) 143     Resp 05/27/18 2115 (!) 22     Temp --      Temp src --      SpO2 05/27/18 2121 99 %     Weight 05/27/18 2125 240 lb 4.8 oz (109 kg)     Height --      Head Circumference --      Peak Flow --      Pain Score --      Pain Loc --      Pain Edu? --      Excl. in GC? --     Constitutional: GCS 3 with agonal respirations HEENT:      Head: Normocephalic and atraumatic.         Eyes: Conjunctivae are normal. Sclera is non-icteric.       Mouth/Throat: Mucous membranes are moist.       Neck: Supple with no signs of meningismus. Cardiovascular: Tachycardic with regular rhythm Respiratory: Agonal respirations  gastrointestinal: Soft, non distended with positive bowel sounds.  Musculoskeletal:  No edema, cyanosis, or erythema of extremities. Neurologic: GCS 3 Skin: Skin is warm, dry and intact. No rash noted.  ____________________________________________   LABS (all labs ordered are listed, but only abnormal results are displayed)  Labs Reviewed  CBC WITH DIFFERENTIAL/PLATELET - Abnormal; Notable for the following components:      Result Value   WBC 15.8 (*)    Hemoglobin 11.9 (*)    MCHC 28.5 (*)    RDW 15.9 (*)    Lymphs Abs 9.0 (*)    Abs Immature Granulocytes 0.40 (*)    All other components within normal limits   COMPREHENSIVE METABOLIC PANEL - Abnormal; Notable for the following components:   CO2 20 (*)    Glucose, Bld 343 (*)    Calcium 8.6 (*)    AST 229 (*)    ALT 135 (*)    Alkaline Phosphatase 155 (*)    Anion gap 16 (*)    All other components within normal limits  TROPONIN I - Abnormal; Notable for the following components:   Troponin I 0.04 (*)    All other components within normal limits  BRAIN NATRIURETIC PEPTIDE - Abnormal; Notable for the following components:   B Natriuretic Peptide 1,217.0 (*)  All other components within normal limits  PROTIME-[name] - Abnormal; Notable for the following components:   Prothrombin Time 24.2 (*)    All other components within normal limits  BLOOD GAS, ARTERIAL - Abnormal; Notable for the following components:   pH, Arterial 7.03 (*)    pCO2 arterial 65 (*)    pO2, Arterial 162 (*)    Bicarbonate 17.2 (*)    Acid-base deficit 14.3 (*)    All other components within normal limits  LACTIC ACID, PLASMA - Abnormal; Notable for the following components:   Lactic Acid, Venous >11.0 (*)    All other components within normal limits  CULTURE, BLOOD (ROUTINE X 2)  CULTURE, BLOOD (ROUTINE X 2)  INFLUENZA PANEL BY PCR (TYPE A & B)  PATHOLOGIST SMEAR REVIEW   ____________________________________________  EKG  ED ECG REPORT I, Nita Sickle, the attending physician, personally viewed and interpreted this ECG.  Sinus tachycardia, rate of 103, interventricular conduction delay, T wave inversions in anterior and lateral leads with slight ST depression in V4 to V6, no ST elevations.  EKG from [facility] has a reading which is similar to this one however no imaging is available ____________________________________________  RADIOLOGY  I have personally reviewed the images performed during this visit and I agree with the Radiologist's read.   Interpretation by Radiologist:  Dg Chest Portable 1 View  Result Date: 05/27/2018 CLINICAL DATA:  Status post  intubation EXAM: PORTABLE CHEST 1 VIEW COMPARISON:  08/02/2016 FINDINGS: Endotracheal tube is noted approximately 1 cm above the carina. It was subsequently withdrawn and reimaged now lying 4 cm above the carina. Changes of prior aortic stent graft are seen and stable. Defibrillator is again noted. Cardiac shadow is again enlarged. The lungs are well aerated. Some patchy infiltrative changes are noted in the upper lobes bilaterally. No bony abnormality is noted. IMPRESSION: Endotracheal tube in satisfactory position. Patchy infiltrates in the upper lobes bilaterally Postsurgical changes. Electronically Signed   By: Alcide Clever M.D.   On: 05/27/2018 21:41   Ct Angio Chest/abd/pel For Dissection W And/or Wo Contrast  Result Date: 05/27/2018 CLINICAL DATA:  Cardiac arrest. EXAM: CT ANGIOGRAPHY CHEST, ABDOMEN AND PELVIS TECHNIQUE: Multidetector CT imaging through the chest, abdomen and pelvis was performed using the standard protocol during bolus administration of intravenous contrast. Multiplanar reconstructed images and MIPs were obtained and reviewed to evaluate the vascular anatomy. CONTRAST:  ISOVUE-370 IOPAMIDOL (ISOVUE-370) INJECTION 76% COMPARISON:  Radiograph earlier this day. Chest in abdominal CT dissection protocol 09/19/2009. Report from dissection protocol CT at an outside institution 05/05/2017 FINDINGS: CTA CHEST FINDINGS Cardiovascular: Redemonstration of type B aortic dissection post endovascular stent graft repair. Graft regional nodes at the level of the left subclavian which is occluded as described previously. There is distal reconstitution of the subclavian, not well assessed due to streak artifact from venous contrast in the axilla. Brachiocephalic and left common carotid artery remain patent. Ascending aorta is normal in caliber. Embolization coils the level of the distal arch lies aortic isthmus, as described on prior. The endovascular stent remains patent. False lumen surrounding  the stent is thrombosed which was described previously, residual luminal dimension of 5.0 x 4.2 cm, previously reported 4.8 x 4.2 cm. Immediately distal to the stent graft in the descending thoracic aorta is focal opacification of the false lumen distal to this outpouching the false lumen is thrombosed, which was described previously. Pacemaker in place. Mild cardiomegaly. No central pulmonary embolus. No pericardial effusion. Mediastinum/Nodes: Prominent soft tissue density  at the right hilum likely lymph nodes, not well assessed due to adjacent lung consolidation. Otherwise no evidence of adenopathy. Esophagus is decompressed. Endotracheal tube tip above the carina. Lungs/Pleura: Perihilar ground-glass opacities, likely pulmonary edema. Dense consolidation in the dependent right upper lobe with air bronchograms small bilateral pleural effusions with adjacent compressive atelectasis, greater on the right. There is septal thickening the apices in the bases consistent with pulmonary edema. 7 mm right middle lobe pulmonary nodule, unchanged from 2011 and considered benign. Probable debris in the right mainstem bronchus causing luminal narrowing to the lower lobe. No pneumothorax. Musculoskeletal: Buckle fractures of anterior lower ribs, commonly seen with CPR. No sternal fracture. Thoracic spine is intact. Review of the MIP images confirms the above findings. CTA ABDOMEN AND PELVIS FINDINGS VASCULAR Aorta: Chronic dissection, at the diaphragmatic hiatus there is opacification of the false lumen. Dissection extends to the iliac bifurcation. Flow within both the true and false lumen to the iliac bifurcation. Thrombus within the false lumen infrarenal E, as described on prior exam. 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 No periaortic stranding to suggest rupture. Celiac: Patent arising from the true lumen.  No dissection. SMA: Patent arising from the true lumen.  No dissection. Renals: 2 right  renal arteries which received flow from the true lumen. Left renal artery receives flow from both the true and false lumen with early luminal branching. IMA: Patent receiving flow from the true lumen. Inflow: Bilateral common iliac stents are in place and remain patent. Dissection extends into the left common iliac artery along the stent proximally and persisting beyond the stent, as described previously. Dissection flap extends to the proximal-most left internal iliac artery. Normal flow within both internal and external iliac arteries. Veins: No obvious venous abnormality within the limitations of this arterial phase study. Review of the MIP images confirms the above findings. NON-VASCULAR Hepatobiliary: No focal hepatic abnormality on arterial phase exam. Postcholecystectomy without biliary dilatation. Pancreas: No ductal dilatation or inflammation. Spleen: Normal in size without focal abnormality. Adrenals/Urinary Tract: Normal adrenal glands. Homogeneous enhancement of both kidneys without hydronephrosis. Urinary bladder is decompressed by Foley catheter. Stomach/Bowel: Ingested material in the stomach. No gastric wall thickening. No small bowel obstruction or inflammatory change. Moderate stool burden throughout the entire colon. No colonic wall thickening or inflammation. There is sigmoid colonic tortuosity. No abnormal rectal distention. The appendix is not visualized. Lymphatic: No abdominopelvic adenopathy. Reproductive: Status post hysterectomy. No adnexal masses. Other: No free air free fluid. Musculoskeletal: Degenerative disc disease at L5-S1. There are no acute or suspicious osseous abnormalities. Review of the MIP images confirms the above findings. IMPRESSION: 1. 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 Thoracic dissection extends into the abdominal aorta through  the left common iliac artery as before. No evidence of aortic rupture or acute findings. 2. Cardiomegaly with pulmonary edema. Dense consolidation in the right upper lobe with debris in the right mainstem bronchus, highly suggestive of aspiration. Small bilateral pleural effusions with adjacent lower lobe atelectasis. 3. Buckle fractures of bilateral anterior ribs, commonly seen with CPR. 4. No acute findings in the abdomen or pelvis. Large colonic stool burden suggesting constipation. Electronically Signed   By: Narda Rutherford M.D.   On: 05/27/2018 23:05     ____________________________________________   PROCEDURES  Procedure(s) performed: yes Procedure Name: Intubation Date/Time: 05/27/2018 10:52 PM Performed by: Don Perking,  [place], MD Pre-anesthesia Checklist: Patient identified, Emergency Drugs available, Suction available and Patient being monitored Preoxygenation: Pre-oxygenation with 100% oxygen Induction Type: IV induction and Rapid sequence Ventilation: Mask ventilation with difficulty and Nasal airway inserted- appropriate to patient size Laryngoscope Size: Glidescope Tube size: 7.5 mm Number of attempts: 1 Airway Equipment and Method: Video-laryngoscopy Placement Confirmation: ETT inserted through vocal cords under direct vision,  CO2 detector and Breath sounds checked- equal and bilateral Secured at: 24 cm Tube secured with: ETT holder Dental Injury: Teeth and Oropharynx as per pre-operative assessment       Critical Care performed: yes  CRITICAL CARE Performed by: Nita Sicklearolina Nyema Hachey  ?  Total critical care time: 45 min  Critical care time was exclusive of separately billable procedures and treating other patients.  Critical care was necessary to treat or prevent imminent or life-threatening deterioration.  Critical care was time spent personally by me on the following activities: development of treatment plan with patient and/or surrogate as well as nursing,  discussions with consultants, evaluation of patient's response to treatment, examination of patient, obtaining history from patient or surrogate, ordering and performing treatments and interventions, ordering and review of laboratory studies, ordering and review of radiographic studies, pulse oximetry and re-evaluation of patient's condition.  ____________________________________________   INITIAL IMPRESSION / ASSESSMENT AND PLAN / ED COURSE   68 y.o. female with a history of V. fib status post AICD, CHF with a EF of 30 to 35%, diabetes, aortic dissection status post repair who presents for cardiac arrest.  Patient arrives with agonal respirations and a GCS of 3 status post cardiac arrest.  She was intubated with rocuronium and etomidate.  She was started on Precedex for sedation.  CTA of the chest abdomen and pelvis showing pneumonia and CHF exacerbation.  Patient was given cefepime and vancomycin.  Labs concerning for sepsis with white count of 15 and a lactic of greater than 11.  Fluids were withheld due to severe congestive heart failure.  Patient was given 40 mg of IV Lasix.  Patient is making urine.  Patient's daughters have been updated of her critical status.  Dr. Anne HahnWillis will admit patient to the ICU.      As part of my medical decision making, I reviewed the following data within the electronic MEDICAL RECORD NUMBER History obtained from family, Nursing notes reviewed and incorporated, Labs reviewed , EKG interpreted , Old EKG reviewed, Old chart reviewed, Radiograph reviewed , Discussed with admitting physician , Notes from prior ED visits and [place] Controlled Substance Database    Pertinent labs & imaging results that were available during my care of the patient were reviewed by me and considered in my medical decision making (see chart for details).    ____________________________________________   FINAL CLINICAL IMPRESSION(S) / ED DIAGNOSES  Final diagnoses:  Cardiac arrest (HCC)   Acute on chronic congestive heart failure, unspecified heart failure type (HCC)  Community acquired pneumonia, unspecified laterality  Sepsis with acute hypoxic respiratory failure without septic shock, due to unspecified organism [facility](HCC)      NEW MEDICATIONS STARTED DURING THIS VISIT:  ED Discharge Orders    None       Note:  This document was prepared using Dragon voice recognition software and may include unintentional dictation errors.    Don PerkingVeronese, [place], MD 05/27/18 [phone]

## 2018-05-27 NOTE — Progress Notes (Signed)
Pastoral Care Visit   05/27/18 2115  Clinical Encounter Type  Visited With Health care provider;Patient not available  Visit Type Initial  Referral From Nurse  Consult/Referral To Chaplain  Recommendations Continue to try and reach daughter, Williemae Natter [phone]  Spiritual Encounters  Spiritual Needs Other (Comment)  Stress Factors  Patient Stress Factors Not reviewed   Pt was non responsive, brought in by EMS, and per EMS called herself, pt was picked up at home. Chap provided compassionate presence. Chap searched medical records and found contact info for pt dated 05/08/2018. Pt has daughter, Williemae Natter [phone]. Mirna Mires called pt daughter and left message on her voicemail letting her know that pt had been brought in by EMS and asking her to come to hospital.  No medical info given.  Milinda Antis, [address]

## 2018-05-27 NOTE — ED Triage Notes (Signed)
Witnessed arrest by ems - called out by pt for difficulty breathing. 1 epi with pea. For ems

## 2018-05-28 ENCOUNTER — Ambulatory Visit: Payer: Medicare HMO | Admitting: Pulmonary Disease

## 2018-05-28 ENCOUNTER — Inpatient Hospital Stay: Payer: Medicare HMO

## 2018-05-28 ENCOUNTER — Other Ambulatory Visit: Payer: Medicare HMO

## 2018-05-28 ENCOUNTER — Inpatient Hospital Stay
Admit: 2018-05-28 | Discharge: 2018-05-28 | Disposition: A | Discharge status: Home / Self Care | Payer: Medicare HMO | Attending: Adult Health | Admitting: Adult Health

## 2018-05-28 DIAGNOSIS — I469 Cardiac arrest, cause unspecified: Secondary | ICD-10-CM

## 2018-05-28 DIAGNOSIS — I4901 Ventricular fibrillation: Secondary | ICD-10-CM | POA: Diagnosis present

## 2018-05-28 DIAGNOSIS — S2243XA Multiple fractures of ribs, bilateral, initial encounter for closed fracture: Secondary | ICD-10-CM | POA: Diagnosis present

## 2018-05-28 DIAGNOSIS — N170 Acute kidney failure with tubular necrosis: Secondary | ICD-10-CM | POA: Diagnosis present

## 2018-05-28 DIAGNOSIS — R402312 Coma scale, best motor response, none, at arrival to emergency department: Secondary | ICD-10-CM | POA: Diagnosis present

## 2018-05-28 DIAGNOSIS — J690 Pneumonitis due to inhalation of food and vomit: Secondary | ICD-10-CM | POA: Diagnosis present

## 2018-05-28 DIAGNOSIS — I2510 Atherosclerotic heart disease of native coronary artery without angina pectoris: Secondary | ICD-10-CM | POA: Diagnosis present

## 2018-05-28 DIAGNOSIS — I4891 Unspecified atrial fibrillation: Secondary | ICD-10-CM | POA: Diagnosis present

## 2018-05-28 DIAGNOSIS — I110 Hypertensive heart disease with heart failure: Secondary | ICD-10-CM | POA: Diagnosis present

## 2018-05-28 DIAGNOSIS — D649 Anemia, unspecified: Secondary | ICD-10-CM | POA: Diagnosis present

## 2018-05-28 DIAGNOSIS — R402212 Coma scale, best verbal response, none, at arrival to emergency department: Secondary | ICD-10-CM | POA: Diagnosis present

## 2018-05-28 DIAGNOSIS — E876 Hypokalemia: Secondary | ICD-10-CM | POA: Diagnosis present

## 2018-05-28 DIAGNOSIS — I5023 Acute on chronic systolic (congestive) heart failure: Secondary | ICD-10-CM | POA: Diagnosis present

## 2018-05-28 DIAGNOSIS — I5022 Chronic systolic (congestive) heart failure: Secondary | ICD-10-CM | POA: Diagnosis not present

## 2018-05-28 DIAGNOSIS — R570 Cardiogenic shock: Secondary | ICD-10-CM | POA: Diagnosis present

## 2018-05-28 DIAGNOSIS — J9601 Acute respiratory failure with hypoxia: Secondary | ICD-10-CM | POA: Diagnosis not present

## 2018-05-28 DIAGNOSIS — Z9049 Acquired absence of other specified parts of digestive tract: Secondary | ICD-10-CM | POA: Diagnosis not present

## 2018-05-28 DIAGNOSIS — K5900 Constipation, unspecified: Secondary | ICD-10-CM | POA: Diagnosis present

## 2018-05-28 DIAGNOSIS — E872 Acidosis: Secondary | ICD-10-CM | POA: Diagnosis present

## 2018-05-28 DIAGNOSIS — Z66 Do not resuscitate: Secondary | ICD-10-CM | POA: Diagnosis not present

## 2018-05-28 DIAGNOSIS — J9602 Acute respiratory failure with hypercapnia: Secondary | ICD-10-CM | POA: Diagnosis present

## 2018-05-28 DIAGNOSIS — I252 Old myocardial infarction: Secondary | ICD-10-CM | POA: Diagnosis not present

## 2018-05-28 DIAGNOSIS — R402112 Coma scale, eyes open, never, at arrival to emergency department: Secondary | ICD-10-CM | POA: Diagnosis present

## 2018-05-28 DIAGNOSIS — I6523 Occlusion and stenosis of bilateral carotid arteries: Secondary | ICD-10-CM | POA: Diagnosis present

## 2018-05-28 DIAGNOSIS — I472 Ventricular tachycardia: Secondary | ICD-10-CM | POA: Diagnosis present

## 2018-05-28 DIAGNOSIS — X58XXXA Exposure to other specified factors, initial encounter: Secondary | ICD-10-CM | POA: Diagnosis present

## 2018-05-28 DIAGNOSIS — G931 Anoxic brain damage, not elsewhere classified: Secondary | ICD-10-CM | POA: Diagnosis present

## 2018-05-28 LAB — PROTIME-INR
INR: 2.23
INR: 2.39
Prothrombin Time: 24.4 seconds — ABNORMAL HIGH (ref 11.4–15.2)
Prothrombin Time: 25.7 seconds — ABNORMAL HIGH (ref 11.4–15.2)

## 2018-05-28 LAB — URINALYSIS, COMPLETE (UACMP) WITH MICROSCOPIC
BACTERIA UA: NONE SEEN
Bilirubin Urine: NEGATIVE
Glucose, UA: NEGATIVE mg/dL
Hgb urine dipstick: NEGATIVE
Ketones, ur: NEGATIVE mg/dL
Leukocytes, UA: NEGATIVE
Nitrite: NEGATIVE
Protein, ur: NEGATIVE mg/dL
SPECIFIC GRAVITY, URINE: 1.02 (ref 1.005–1.030)
pH: 7 (ref 5.0–8.0)

## 2018-05-28 LAB — ECHOCARDIOGRAM COMPLETE
Height: 68 in
Weight: 3671.98 oz

## 2018-05-28 LAB — URINE DRUG SCREEN, QUALITATIVE (ARMC ONLY)
Amphetamines, Ur Screen: NOT DETECTED
BENZODIAZEPINE, UR SCRN: NOT DETECTED
Barbiturates, Ur Screen: NOT DETECTED
Cannabinoid 50 Ng, Ur ~~LOC~~: NOT DETECTED
Cocaine Metabolite,Ur ~~LOC~~: NOT DETECTED
MDMA (Ecstasy)Ur Screen: NOT DETECTED
Methadone Scn, Ur: NOT DETECTED
OPIATE, UR SCREEN: NOT DETECTED
Phencyclidine (PCP) Ur S: NOT DETECTED
Tricyclic, Ur Screen: NOT DETECTED

## 2018-05-28 LAB — BLOOD GAS, ARTERIAL
Acid-base deficit: 0.8 mmol/L (ref 0.0–2.0)
Bicarbonate: 25.4 mmol/L (ref 20.0–28.0)
FIO2: 0.6
LHR: 20 {breaths}/min
MECHVT: 500 mL
O2 Saturation: 96.2 %
PEEP: 5 cmH2O
Patient temperature: 37
pCO2 arterial: 47 mmHg (ref 32.0–48.0)
pH, Arterial: 7.34 — ABNORMAL LOW (ref 7.350–7.450)
pO2, Arterial: 88 mmHg (ref 83.0–108.0)

## 2018-05-28 LAB — BASIC METABOLIC PANEL
ANION GAP: 11 (ref 5–15)
ANION GAP: 8 (ref 5–15)
Anion gap: 6 (ref 5–15)
Anion gap: 8 (ref 5–15)
Anion gap: 5 (ref 5–15)
BUN: 23 mg/dL (ref 8–23)
BUN: 23 mg/dL (ref 8–23)
BUN: 22 mg/dL (ref 8–23)
BUN: 23 mg/dL (ref 8–23)
BUN: 24 mg/dL — ABNORMAL HIGH (ref 8–23)
CALCIUM: 8.2 mg/dL — AB (ref 8.9–10.3)
CALCIUM: 8.5 mg/dL — AB (ref 8.9–10.3)
CALCIUM: 8.5 mg/dL — AB (ref 8.9–10.3)
CHLORIDE: 116 mmol/L — AB (ref 98–111)
CO2: 26 mmol/L (ref 22–32)
CO2: 22 mmol/L (ref 22–32)
CO2: 25 mmol/L (ref 22–32)
CO2: 26 mmol/L (ref 22–32)
CO2: 22 mmol/L (ref 22–32)
Calcium: 8.5 mg/dL — ABNORMAL LOW (ref 8.9–10.3)
Calcium: 7 mg/dL — ABNORMAL LOW (ref 8.9–10.3)
Chloride: 109 mmol/L (ref 98–111)
Chloride: 109 mmol/L (ref 98–111)
Chloride: 108 mmol/L (ref 98–111)
Chloride: 107 mmol/L (ref 98–111)
Creatinine, Ser: 0.94 mg/dL (ref 0.44–1.00)
Creatinine, Ser: 0.9 mg/dL (ref 0.44–1.00)
Creatinine, Ser: 0.62 mg/dL (ref 0.44–1.00)
Creatinine, Ser: 0.83 mg/dL (ref 0.44–1.00)
Creatinine, Ser: 0.67 mg/dL (ref 0.44–1.00)
GFR calc Af Amer: >60 mL/min (ref >60)
GFR calc Af Amer: >60 mL/min (ref >60)
GFR calc Af Amer: >60 mL/min (ref >60)
GFR calc Af Amer: >60 mL/min (ref >60)
GFR calc non Af Amer: >60 mL/min (ref >60)
GFR calc non Af Amer: >60 mL/min (ref >60)
GFR calc non Af Amer: >60 mL/min (ref >60)
GFR calc non Af Amer: >60 mL/min (ref >60)
Glucose, Bld: 208 mg/dL — ABNORMAL HIGH (ref 70–99)
Glucose, Bld: 188 mg/dL — ABNORMAL HIGH (ref 70–99)
Glucose, Bld: 207 mg/dL — ABNORMAL HIGH (ref 70–99)
Glucose, Bld: 199 mg/dL — ABNORMAL HIGH (ref 70–99)
Glucose, Bld: 136 mg/dL — ABNORMAL HIGH (ref 70–99)
POTASSIUM: 3.9 mmol/L (ref 3.5–5.1)
Potassium: 4.3 mmol/L (ref 3.5–5.1)
Potassium: 4.9 mmol/L (ref 3.5–5.1)
Potassium: 3.8 mmol/L (ref 3.5–5.1)
Potassium: 3.7 mmol/L (ref 3.5–5.1)
Sodium: 141 mmol/L (ref 135–145)
Sodium: 142 mmol/L (ref 135–145)
Sodium: 141 mmol/L (ref 135–145)
Sodium: 141 mmol/L (ref 135–145)
Sodium: 143 mmol/L (ref 135–145)

## 2018-05-28 LAB — CBC
HCT: 38.9 % (ref 36.0–46.0)
Hemoglobin: 12 g/dL (ref 12.0–15.0)
MCH: 27 pg (ref 26.0–34.0)
MCHC: 30.8 g/dL (ref 30.0–36.0)
MCV: 87.4 fL (ref 80.0–100.0)
PLATELETS: 140 10*3/uL — AB (ref 150–400)
RBC: 4.45 MIL/uL (ref 3.87–5.11)
RDW: 15.9 % — ABNORMAL HIGH (ref 11.5–15.5)
WBC: 13.5 10*3/uL — ABNORMAL HIGH (ref 4.0–10.5)
nRBC: 0 % (ref 0.0–0.2)

## 2018-05-28 LAB — GLUCOSE, CAPILLARY
Glucose-Capillary: 148 mg/dL — ABNORMAL HIGH (ref 70–99)
Glucose-Capillary: 151 mg/dL — ABNORMAL HIGH (ref 70–99)
Glucose-Capillary: 165 mg/dL — ABNORMAL HIGH (ref 70–99)
Glucose-Capillary: 175 mg/dL — ABNORMAL HIGH (ref 70–99)
Glucose-Capillary: 177 mg/dL — ABNORMAL HIGH (ref 70–99)
Glucose-Capillary: 219 mg/dL — ABNORMAL HIGH (ref 70–99)

## 2018-05-28 LAB — PHOSPHORUS: PHOSPHORUS: 4.6 mg/dL (ref 2.5–4.6)

## 2018-05-28 LAB — TROPONIN I
TROPONIN I: 0.19 ng/mL — AB (ref <0.03)
Troponin I: 0.23 ng/mL (ref <0.03)
Troponin I: 0.25 ng/mL (ref <0.03)
Troponin I: 0.3 ng/mL (ref <0.03)

## 2018-05-28 LAB — MAGNESIUM
MAGNESIUM: 2.1 mg/dL (ref 1.7–2.4)
Magnesium: 2 mg/dL (ref 1.7–2.4)

## 2018-05-28 LAB — MRSA PCR SCREENING: MRSA by PCR: NEGATIVE

## 2018-05-28 LAB — HEMOGLOBIN A1C
Hgb A1c MFr Bld: 7 % — ABNORMAL HIGH (ref 4.8–5.6)
Mean Plasma Glucose: 154.2 mg/dL

## 2018-05-28 LAB — PROCALCITONIN: Procalcitonin: 2.87 ng/mL

## 2018-05-28 LAB — LACTIC ACID, PLASMA: Lactic Acid, Venous: 1.4 mmol/L (ref 0.5–1.9)

## 2018-05-28 LAB — PATHOLOGIST SMEAR REVIEW

## 2018-05-28 MED ORDER — FENTANYL CITRATE (PF) 100 MCG/2ML IJ SOLN: 50.0000 ug | Freq: Once | INTRAMUSCULAR | Status: DC

## 2018-05-28 MED ORDER — IPRATROPIUM-ALBUTEROL 0.5-2.5 (3) MG/3ML IN SOLN
3.0000 mL | RESPIRATORY_TRACT | Status: DC | PRN
  Administered 2018-05-29 – 2018-05-30 (×2): 3 mL via RESPIRATORY_TRACT
  Filled 2018-05-28 (×2): qty 3

## 2018-05-28 MED ORDER — ARTIFICIAL TEARS OPHTHALMIC OINT
1.0000 "application " | TOPICAL_OINTMENT | Freq: Three times a day (TID) | OPHTHALMIC | Status: DC
  Administered 2018-05-28 – 2018-06-01 (×12): 1 via OPHTHALMIC
  Filled 2018-05-28 (×2): qty 1

## 2018-05-28 MED ORDER — FENTANYL 2500MCG IN NS 250ML (10MCG/ML) PREMIX INFUSION
25.0000 ug/h | INTRAVENOUS | Status: DC
  Administered 2018-05-28: 400 ug/h via INTRAVENOUS
  Administered 2018-05-28: 50 ug/h via INTRAVENOUS
  Administered 2018-05-28: 300 ug/h via INTRAVENOUS
  Administered 2018-05-29 – 2018-05-30 (×5): 400 ug/h via INTRAVENOUS
  Administered 2018-05-31: 200 ug/h via INTRAVENOUS
  Filled 2018-05-28 (×8): qty 250

## 2018-05-28 MED ORDER — VITAL HIGH PROTEIN PO LIQD
1000.0000 mL | ORAL | Status: DC
  Administered 2018-05-28 – 2018-06-01 (×5): 1000 mL

## 2018-05-28 MED ORDER — SODIUM CHLORIDE 0.9% FLUSH: 10.0000 mL | INTRAVENOUS | Status: DC | PRN

## 2018-05-28 MED ORDER — IPRATROPIUM-ALBUTEROL 0.5-2.5 (3) MG/3ML IN SOLN
3.0000 mL | RESPIRATORY_TRACT | Status: DC
  Administered 2018-05-28: 3 mL via RESPIRATORY_TRACT
  Filled 2018-05-28: qty 3

## 2018-05-28 MED ORDER — LACTULOSE 10 GM/15ML PO SOLN
30.0000 g | Freq: Once | ORAL | Status: AC
  Administered 2018-05-28: 30 g via ORAL
  Filled 2018-05-28: qty 60

## 2018-05-28 MED ORDER — MIDAZOLAM HCL 2 MG/2ML IJ SOLN: 1.0000 mg | INTRAMUSCULAR | Status: DC | PRN

## 2018-05-28 MED ORDER — MIDAZOLAM HCL 2 MG/2ML IJ SOLN: 2.0000 mg | Freq: Once | INTRAMUSCULAR | Status: DC

## 2018-05-28 MED ORDER — ADULT MULTIVITAMIN LIQUID CH
15.0000 mL | Freq: Every day | ORAL | Status: DC
  Administered 2018-05-28 – 2018-06-01 (×5): 15 mL
  Filled 2018-05-28 (×6): qty 15

## 2018-05-28 MED ORDER — CHLORHEXIDINE GLUCONATE 0.12% ORAL RINSE (MEDLINE KIT)
15.0000 mL | Freq: Two times a day (BID) | OROMUCOSAL | Status: DC
  Administered 2018-05-28 – 2018-06-01 (×9): 15 mL via OROMUCOSAL

## 2018-05-28 MED ORDER — VECURONIUM BROMIDE 10 MG IV SOLR
10.0000 mg | INTRAVENOUS | Status: DC | PRN
  Administered 2018-05-28 – 2018-05-29 (×9): 10 mg via INTRAVENOUS
  Filled 2018-05-28 (×10): qty 10

## 2018-05-28 MED ORDER — ORAL CARE MOUTH RINSE
15.0000 mL | OROMUCOSAL | Status: DC
  Administered 2018-05-28 – 2018-06-02 (×50): 15 mL via OROMUCOSAL

## 2018-05-28 MED ORDER — INSULIN ASPART 100 UNIT/ML ~~LOC~~ SOLN
0.0000 [IU] | SUBCUTANEOUS | Status: DC
  Administered 2018-05-28 (×2): 3 [IU] via SUBCUTANEOUS
  Administered 2018-05-28: 2 [IU] via SUBCUTANEOUS
  Administered 2018-05-28 – 2018-05-29 (×4): 3 [IU] via SUBCUTANEOUS
  Administered 2018-05-29: 2 [IU] via SUBCUTANEOUS
  Administered 2018-05-29 – 2018-05-30 (×2): 3 [IU] via SUBCUTANEOUS
  Administered 2018-05-30: 2 [IU] via SUBCUTANEOUS
  Administered 2018-05-30: 3 [IU] via SUBCUTANEOUS
  Administered 2018-05-30 (×2): 2 [IU] via SUBCUTANEOUS
  Administered 2018-05-31: 3 [IU] via SUBCUTANEOUS
  Administered 2018-05-31 (×4): 5 [IU] via SUBCUTANEOUS
  Administered 2018-05-31: 3 [IU] via SUBCUTANEOUS
  Administered 2018-06-01 (×2): 8 [IU] via SUBCUTANEOUS
  Administered 2018-06-01: 2 [IU] via SUBCUTANEOUS
  Administered 2018-06-01: 3 [IU] via SUBCUTANEOUS
  Administered 2018-06-01: 11 [IU] via SUBCUTANEOUS
  Administered 2018-06-01: 8 [IU] via SUBCUTANEOUS
  Administered 2018-06-02 – 2018-06-03 (×9): 3 [IU] via SUBCUTANEOUS
  Filled 2018-05-28 (×35): qty 1

## 2018-05-28 MED ORDER — HEPARIN SODIUM (PORCINE) 5000 UNIT/ML IJ SOLN
5000.0000 [IU] | Freq: Three times a day (TID) | INTRAMUSCULAR | Status: DC
  Administered 2018-05-28 – 2018-06-05 (×24): 5000 [IU] via SUBCUTANEOUS
  Filled 2018-05-28 (×24): qty 1

## 2018-05-28 MED ORDER — FENTANYL BOLUS VIA INFUSION
25.0000 ug | INTRAVENOUS | Status: DC | PRN
  Filled 2018-05-28: qty 50

## 2018-05-28 MED ORDER — SENNOSIDES 8.8 MG/5ML PO SYRP: 5.0000 mL | ORAL_SOLUTION | Freq: Two times a day (BID) | ORAL | Status: DC | PRN

## 2018-05-28 MED ORDER — IPRATROPIUM-ALBUTEROL 0.5-2.5 (3) MG/3ML IN SOLN
RESPIRATORY_TRACT | Status: AC
  Administered 2018-05-28: 3 mL
  Filled 2018-05-28: qty 3

## 2018-05-28 MED ORDER — ACETAMINOPHEN 325 MG PO TABS
650.0000 mg | ORAL_TABLET | ORAL | Status: DC | PRN
  Administered 2018-06-02 – 2018-06-03 (×2): 650 mg via ORAL
  Filled 2018-05-28 (×3): qty 2

## 2018-05-28 MED ORDER — SODIUM CHLORIDE 0.9 % IV SOLN
INTRAVENOUS | Status: DC
  Administered 2018-05-28 – 2018-05-30 (×2): via INTRAVENOUS

## 2018-05-28 MED ORDER — SODIUM CHLORIDE 0.9% FLUSH
10.0000 mL | Freq: Two times a day (BID) | INTRAVENOUS | Status: DC
  Administered 2018-05-28: 20 mL
  Administered 2018-05-28 – 2018-05-30 (×5): 10 mL
  Administered 2018-05-31: 30 mL
  Administered 2018-05-31 – 2018-06-01 (×2): 10 mL
  Administered 2018-06-01: 20 mL
  Administered 2018-06-02: 10 mL
  Administered 2018-06-02: 30 mL
  Administered 2018-06-03: 20 mL
  Administered 2018-06-03 – 2018-06-05 (×4): 10 mL

## 2018-05-28 MED ORDER — PIPERACILLIN-TAZOBACTAM 3.375 G IVPB
3.3750 g | Freq: Three times a day (TID) | INTRAVENOUS | Status: DC
  Administered 2018-05-28: 3.375 g via INTRAVENOUS
  Filled 2018-05-28: qty 50

## 2018-05-28 MED ORDER — SODIUM CHLORIDE 0.9 % IV SOLN
1.0000 ug/kg/min | INTRAVENOUS | Status: DC
  Administered 2018-05-28: 1 ug/kg/min via INTRAVENOUS
  Filled 2018-05-28: qty 20

## 2018-05-28 MED ORDER — BISACODYL 10 MG RE SUPP
10.0000 mg | Freq: Every day | RECTAL | Status: DC | PRN
  Administered 2018-05-31: 10 mg via RECTAL
  Filled 2018-05-28: qty 1

## 2018-05-28 MED ORDER — CISATRACURIUM BOLUS VIA INFUSION
0.0500 mg/kg | INTRAVENOUS | Status: AC | PRN
  Administered 2018-05-28: 5.5 mg via INTRAVENOUS
  Filled 2018-05-28: qty 6

## 2018-05-28 MED ORDER — FENTANYL BOLUS VIA INFUSION
50.0000 ug | INTRAVENOUS | Status: DC | PRN
  Administered 2018-05-28: 100 ug via INTRAVENOUS
  Filled 2018-05-28: qty 100

## 2018-05-28 MED ORDER — PERFLUTREN LIPID MICROSPHERE
1.0000 mL | INTRAVENOUS | Status: AC | PRN
  Administered 2018-05-28: 2 mL via INTRAVENOUS
  Filled 2018-05-28: qty 10

## 2018-05-28 MED ORDER — MIDAZOLAM BOLUS VIA INFUSION
1.0000 mg | INTRAVENOUS | Status: DC | PRN
  Administered 2018-05-28 (×3): 2 mg via INTRAVENOUS
  Filled 2018-05-28: qty 2

## 2018-05-28 MED ORDER — PANTOPRAZOLE SODIUM 40 MG IV SOLR
40.0000 mg | Freq: Every day | INTRAVENOUS | Status: DC
  Administered 2018-05-28 – 2018-05-31 (×4): 40 mg via INTRAVENOUS
  Filled 2018-05-28 (×4): qty 40

## 2018-05-28 MED ORDER — CALCIUM GLUCONATE-NACL 2-0.675 GM/100ML-% IV SOLN
2.0000 g | Freq: Once | INTRAVENOUS | Status: AC
  Administered 2018-05-28: 2000 mg via INTRAVENOUS
  Filled 2018-05-28: qty 100

## 2018-05-28 MED ORDER — PRO-STAT SUGAR FREE PO LIQD
30.0000 mL | Freq: Three times a day (TID) | ORAL | Status: DC
  Administered 2018-05-28 – 2018-06-01 (×13): 30 mL

## 2018-05-28 MED ORDER — CISATRACURIUM BOLUS VIA INFUSION
0.1000 mg/kg | Freq: Once | INTRAVENOUS | Status: DC
  Filled 2018-05-28: qty 11

## 2018-05-28 MED ORDER — ONDANSETRON HCL 4 MG/2ML IJ SOLN: 4.0000 mg | Freq: Four times a day (QID) | INTRAMUSCULAR | Status: DC | PRN

## 2018-05-28 MED ORDER — NOREPINEPHRINE-SODIUM CHLORIDE 4-0.9 MG/250ML-% IV SOLN
0.0000 ug/min | INTRAVENOUS | Status: DC
  Administered 2018-05-28 – 2018-05-29 (×2): 2 ug/min via INTRAVENOUS
  Filled 2018-05-28: qty 250

## 2018-05-28 MED ORDER — ENOXAPARIN SODIUM 40 MG/0.4ML ~~LOC~~ SOLN: 40.0000 mg | SUBCUTANEOUS | Status: DC

## 2018-05-28 MED ORDER — FENTANYL CITRATE (PF) 100 MCG/2ML IJ SOLN
100.0000 ug | INTRAMUSCULAR | Status: AC
  Administered 2018-05-28: 100 ug via INTRAVENOUS

## 2018-05-28 MED ORDER — SODIUM CHLORIDE 0.9 % IV SOLN
2.0000 mg/h | INTRAVENOUS | Status: DC
  Administered 2018-05-28 (×2): 2 mg/h via INTRAVENOUS
  Administered 2018-05-29: 6 mg/h via INTRAVENOUS
  Administered 2018-05-29: 5 mg/h via INTRAVENOUS
  Administered 2018-05-29 – 2018-05-30 (×2): 6 mg/h via INTRAVENOUS
  Filled 2018-05-28 (×6): qty 10

## 2018-05-28 MED ORDER — FAMOTIDINE IN NACL 20-0.9 MG/50ML-% IV SOLN
20.0000 mg | Freq: Two times a day (BID) | INTRAVENOUS | Status: DC
  Administered 2018-05-28 – 2018-05-31 (×8): 20 mg via INTRAVENOUS
  Filled 2018-05-28 (×8): qty 50

## 2018-05-28 MED ORDER — MIDAZOLAM HCL 2 MG/2ML IJ SOLN
2.0000 mg | INTRAMUSCULAR | Status: AC
  Administered 2018-05-28: 2 mg via INTRAVENOUS

## 2018-05-28 MED ORDER — MIDAZOLAM 50MG/50ML (1MG/ML) PREMIX INFUSION
2.0000 mg/h | INTRAVENOUS | Status: DC
  Filled 2018-05-28: qty 50

## 2018-05-28 MED ORDER — FUROSEMIDE 10 MG/ML IJ SOLN
20.0000 mg | Freq: Every day | INTRAMUSCULAR | Status: DC
  Administered 2018-05-28 – 2018-06-05 (×9): 20 mg via INTRAVENOUS
  Filled 2018-05-28 (×9): qty 2

## 2018-05-28 NOTE — ED Notes (Signed)
Pt is stirring. Sedation has been increased. Dr Anne Hahn gives verbal order for of fentanyl. Med given.

## 2018-05-28 NOTE — Progress Notes (Signed)
Spoke with Luci Bankukov, NP and made her aware that patient has began shivering mildly and that it has been two hours since starting target temperature management and that patient's current temperature is not near goal only at 36.4 celsius and patient has had pads and ice packs on.  NP stated to start nimbex drip now and to bolus patient with nimbex per PRN order and to bolus IV fentanyl and versed.

## 2018-05-28 NOTE — Progress Notes (Signed)
*  PRELIMINARY RESULTS* Echocardiogram 2D Echocardiogram has been performed.  Desiree Mccall Desiree Mccall 05/28/2018, 9:15 AM

## 2018-05-28 NOTE — Progress Notes (Signed)
RN made Desiree Bank, NP aware of troponin of 0.23 and NP acknowledged giving no new orders.

## 2018-05-28 NOTE — Consult Note (Addendum)
PULMONARY / CRITICAL CARE MEDICINE  Name: Desiree Mccall MRN: [id number] DOB: [birth year]    LOS: 0  Referring Provider: Dr. Anne Hahn Reason for Referral: Cardiac arrest Brief patient description: 68 year old female admitted following a witnessed PEA arrest at home.  Now admitted to the ICU for targeted temperature management  HPI: This is a 68 year old female with a medical history as indicated below who presented to the ED via EMS following a witnessed cardiac arrest at home.  History is obtained from ED records as patient is currently intubated and sedated.  Per ED records, EMS was called by the patient.  When EMS arrived, patient came to the door, open the door and was noted to be "purple".  She then became unresponsive and pulseless.  Initial rhythm per EMS was PEA.  She received 1 round of CPR with 1 dose of epinephrine with return of spontaneous circulation.  Upon arrival in the ED, patient was agonal and hence was intubated.  Her ED work-up showed patchy infiltrates in bilateral upper lobes on chest x-ray.  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  CT also shows fractures of the bilateral anterior ribs due to CPR and large amount of stool in the colon.  Stat CT head shows no acute changes. She was given Lasix and started on broad-spectrum antibiotics.  She is being admitted to the ICU for targeted temperature management. 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  She is being followed at [facility].  She had a left heart catheterization on 06/07/2017.  Report is unavailable.  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  Her nuclear medicine stress test in January 2019 showed an estimated left ventricular ejection fraction of 32%.  Her last 2D echo was on May 19, 2017 and showed a left ventricular ejection fraction of 30%.  Past Medical History:  Diagnosis Date  . Aortic dissection (HCC) 2010  . CHF (congestive heart failure) (HCC)   . Diabetes mellitus without complication (HCC)   . [name]   . Heart attack (HCC) sept. 25, 2010   Past Surgical History:  Procedure Laterality Date  . ABDOMINAL HYSTERECTOMY    . CARDIAC SURGERY    . CAROTID STENT  2010  . CHOLECYSTECTOMY    . STENT PLACE LEFT URETER ([facility] HX)     Prior to Admission medications   Medication Sig Start Date End Date Taking? Authorizing Provider  amLODipine (NORVASC) 5 MG tablet Take 5 mg by mouth daily.   Yes [provider]  clopidogrel (PLAVIX) 75 MG tablet Take 75 mg by mouth daily.   Yes [provider]  donepezil (ARICEPT) 5 MG tablet Take 1 tablet (5 mg total) by mouth at bedtime. 12/25/17 02/03/18 Yes Sowles, Danna Hefty, MD  empagliflozin (JARDIANCE) 25 MG TABS tablet Take 25 mg by mouth daily.   Yes [provider]  glycopyrrolate (ROBINUL) 1 MG tablet Take 1 mg by mouth 2 (two) times daily.   Yes [provider]  insulin aspart (NOVOLOG FLEXPEN) 100 UNIT/ML FlexPen Inject 12 Units into the skin 2 (two) times daily.   Yes [provider]  insulin aspart (NOVOLOG) 100 UNIT/ML FlexPen Inject 18 Units into the skin daily. At 1700   Yes [provider]  Insulin Degludec-Liraglutide (XULTOPHY) 100-3.6 UNIT-MG/ML SOPN Inject 50 Units into the skin daily.   Yes [provider]  levETIRAcetam (KEPPRA) 500 MG tablet Take 500 mg by mouth 2 (two) times daily.   Yes [provider]  lipase/protease/amylase (CREON) 12000 units CPEP capsule Take 6,000 Units by mouth 3 (three) times daily before meals.   Yes [provider]  lipase/protease/amylase  (CREON) 12000 units CPEP capsule Take 3,000 Units by mouth at bedtime. With snack   Yes [provider]  lisinopril (PRINIVIL,ZESTRIL) 5 MG tablet Take 5 mg by mouth daily.   Yes [provider]  metoprolol succinate (TOPROL-XL) 25 MG 24 hr tablet Take 1 tablet (25 mg total) by mouth daily. 12/25/17  Yes Sowles, Danna HeftyKrichna, MD  rosuvastatin (CRESTOR) 40 MG tablet Take 1 tablet (40 mg total) by mouth daily. 12/25/17 02/03/18 Yes Alba CorySowles, Krichna, MD  aspirin EC 81 MG tablet Take 81 mg by mouth daily.    [provider]  famotidine (PEPCID) 20 MG tablet Take 1 tablet (20 mg total) by mouth 2 (two) times daily. 12/25/17 01/24/18  Alba CorySowles, Krichna, MD  gabapentin (NEURONTIN) 300 MG capsule Take 1 capsule (300 mg total) by mouth 2 (two) times daily. 12/25/17 01/24/18  Alba CorySowles, Krichna, MD  insulin glargine (LANTUS) 100 UNIT/ML injection Inject 0.1 mLs (10 Units total) into the skin daily. 12/25/17 01/24/18  Alba CorySowles, Krichna, MD  lacosamide 100 MG TABS Take 1 tablet (100 mg total) by mouth 2 (two) times daily. Patient not taking: Reported on 02/03/2018 05/12/17   Enedina FinnerPatel, Sona, MD  promethazine (PHENERGAN) 12.5 MG tablet Take 1 tablet (12.5 mg total) by mouth every 6 (six) hours as needed for nausea or vomiting. Patient not taking: Reported on 02/03/2018 02/28/17   Almond LintByerly, Faera, MD  sertraline (ZOLOFT) 25 MG tablet Take 1 tablet (25 mg total) by mouth daily. Patient not taking: Reported on 02/03/2018 12/25/17   Alba CorySowles, Krichna, MD   Allergies [name]  Family History Family History  Problem Relation Age of Onset  . Heart failure Mother   . Heart failure Father    Social History  reports that she has been smoking cigarettes. She started smoking about 8 years ago. She has been smoking about 0.50 packs per day. She has never used smokeless tobacco. She reports current alcohol use. She reports that she does not use drugs.  Review Of Systems: Unable to obtain as patient is currently  intubated and sedated  VITAL SIGNS: BP 97/64   Pulse 66   Temp 98.8 F (37.1 C) (Bladder)   Resp 18   Ht 5\' 8"  (1.727 m)   Wt 109 kg   SpO2 100%   BMI 36.54 kg/m   HEMODYNAMICS:    VENTILATOR SETTINGS: FiO2 (%):  [100 %] 100 % Set Rate:  [20 bmp] 20 bmp Vt Set:  [500 mL] 500 mL PEEP:  [5 cmH20] 5 cmH20  INTAKE / OUTPUT: No intake/output data recorded.  PHYSICAL EXAMINATION: General: Well-nourished, well-developed, in no acute distress HEENT: Normocephalic and atraumatic, pupils are pinpoint, reactive, corneal reflexes intact, moderate JVD  Neuro: Grimaces and withdraws to noxious stimulus, moves all extremities, gag reflex intact Cardiovascular: Apical pulse regular, S1-S2, no murmur regurg or gallop, +2 pulses bilaterally, left chest wall with pacer palpable pacemaker Lungs: Bilateral breath sounds, diminished in the bases, mild diffuse rhonchi  in anterior lung fields, no wheezing Abdomen: Nondistended, normal bowel sounds in all 4 quadrants, palpation reveals no organomegaly Musculoskeletal: No joint deformities, positive range of motion Skin: Warm and dry  LABS:  BMET Recent Labs  Lab 05/27/18 2120  NA 142  K 4.0  CL 106  CO2 20*  BUN 18  CREATININE 0.96  GLUCOSE 343*    Electrolytes Recent Labs  Lab 05/27/18 2120  CALCIUM 8.6*    CBC Recent Labs  Lab 05/27/18 2120  WBC 15.8*  HGB 11.9*  HCT 41.8  PLT 192    Coag's Recent Labs  Lab 05/27/18 2120  [name] 2.21    Sepsis Markers Recent Labs  Lab 05/27/18 2120  LATICACIDVEN >11.0*    ABG Recent Labs  Lab 05/27/18 2132  PHART 7.03*  PCO2ART 65*  PO2ART 162*    Liver Enzymes Recent Labs  Lab 05/27/18 2120  AST 229*  ALT 135*  ALKPHOS 155*  BILITOT 0.7  ALBUMIN 3.5    Cardiac Enzymes Recent Labs  Lab 05/27/18 2120  TROPONINI 0.04*    Glucose No results for input(s): GLUCAP in the last 168 hours.  Imaging Dg Chest Portable 1 View  Result Date:  05/27/2018 CLINICAL DATA:  Status post intubation EXAM: PORTABLE CHEST 1 VIEW COMPARISON:  08/02/2016 FINDINGS: Endotracheal tube is noted approximately 1 cm above the carina. It was subsequently withdrawn and reimaged now lying 4 cm above the carina. Changes of prior aortic stent graft are seen and stable. Defibrillator is again noted. Cardiac shadow is again enlarged. The lungs are well aerated. Some patchy infiltrative changes are noted in the upper lobes bilaterally. No bony abnormality is noted. IMPRESSION: Endotracheal tube in satisfactory position. Patchy infiltrates in the upper lobes bilaterally Postsurgical changes. Electronically Signed   By: Alcide CleverMark  Lukens M.D.   On: 05/27/2018 21:41   Ct Angio Chest/abd/pel For Dissection W And/or Wo Contrast  Result Date: 05/27/2018 CLINICAL DATA:  Cardiac arrest. EXAM: CT ANGIOGRAPHY CHEST, ABDOMEN AND PELVIS TECHNIQUE: Multidetector CT imaging through the chest, abdomen and pelvis was performed using the standard protocol during bolus administration of intravenous contrast. Multiplanar reconstructed images and MIPs were obtained and reviewed to evaluate the vascular anatomy. CONTRAST:  125mL ISOVUE-370 IOPAMIDOL (ISOVUE-370) INJECTION 76% COMPARISON:  Radiograph earlier this day. Chest in abdominal CT dissection protocol 09/19/2009. Report from dissection protocol CT at an outside institution 05/05/2017 FINDINGS: CTA CHEST FINDINGS Cardiovascular: Redemonstration of type B aortic dissection post endovascular stent graft repair. Graft regional nodes at the level of the left subclavian which is occluded as described previously. There is distal reconstitution of the subclavian, not well assessed due to streak artifact from venous contrast in the axilla. Brachiocephalic and left common carotid artery remain patent. Ascending aorta is normal in caliber. Embolization coils the level of the distal arch lies aortic isthmus, as described on prior. The endovascular stent  remains patent. False lumen surrounding the stent is thrombosed which was described previously, residual luminal dimension of 5.0 x 4.2 cm, previously reported 4.8 x 4.2 cm. Immediately distal to the stent graft in the descending thoracic aorta is focal opacification of the false lumen distal to this outpouching the false lumen is thrombosed, which was described previously. Pacemaker in place. Mild cardiomegaly. No central pulmonary embolus. No pericardial effusion. Mediastinum/Nodes: Prominent soft tissue density at the right hilum likely lymph nodes, not well assessed due to adjacent lung consolidation. Otherwise no evidence of adenopathy. Esophagus is decompressed. Endotracheal tube tip above the carina. Lungs/Pleura:  Perihilar ground-glass opacities, likely pulmonary edema. Dense consolidation in the dependent right upper lobe with air bronchograms small bilateral pleural effusions with adjacent compressive atelectasis, greater on the right. There is septal thickening the apices in the bases consistent with pulmonary edema. 7 mm right middle lobe pulmonary nodule, unchanged from 2011 and considered benign. Probable debris in the right mainstem bronchus causing luminal narrowing to the lower lobe. No pneumothorax. Musculoskeletal: Buckle fractures of anterior lower ribs, commonly seen with CPR. No sternal fracture. Thoracic spine is intact. Review of the MIP images confirms the above findings. CTA ABDOMEN AND PELVIS FINDINGS VASCULAR Aorta: Chronic dissection, at the diaphragmatic hiatus there is opacification of the false lumen. Dissection extends to the iliac bifurcation. Flow within both the true and false lumen to the iliac bifurcation. Thrombus within the false lumen infrarenal E, as described on prior exam. 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 No periaortic stranding to suggest rupture. Celiac: Patent arising from the true lumen.  No dissection. SMA: Patent arising from the true  lumen.  No dissection. Renals: 2 right renal arteries which received flow from the true lumen. Left renal artery receives flow from both the true and false lumen with early luminal branching. IMA: Patent receiving flow from the true lumen. Inflow: Bilateral common iliac stents are in place and remain patent. Dissection extends into the left common iliac artery along the stent proximally and persisting beyond the stent, as described previously. Dissection flap extends to the proximal-most left internal iliac artery. Normal flow within both internal and external iliac arteries. Veins: No obvious venous abnormality within the limitations of this arterial phase study. Review of the MIP images confirms the above findings. NON-VASCULAR Hepatobiliary: No focal hepatic abnormality on arterial phase exam. Postcholecystectomy without biliary dilatation. Pancreas: No ductal dilatation or inflammation. Spleen: Normal in size without focal abnormality. Adrenals/Urinary Tract: Normal adrenal glands. Homogeneous enhancement of both kidneys without hydronephrosis. Urinary bladder is decompressed by Foley catheter. Stomach/Bowel: Ingested material in the stomach. No gastric wall thickening. No small bowel obstruction or inflammatory change. Moderate stool burden throughout the entire colon. No colonic wall thickening or inflammation. There is sigmoid colonic tortuosity. No abnormal rectal distention. The appendix is not visualized. Lymphatic: No abdominopelvic adenopathy. Reproductive: Status post hysterectomy. No adnexal masses. Other: No free air free fluid. Musculoskeletal: Degenerative disc disease at L5-S1. There are no acute or suspicious osseous abnormalities. Review of the MIP images confirms the above findings. IMPRESSION: 1. 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 Thoracic dissection  extends into the abdominal aorta through the left common iliac artery as before. No evidence of aortic rupture or acute findings. 2. Cardiomegaly with pulmonary edema. Dense consolidation in the right upper lobe with debris in the right mainstem bronchus, highly suggestive of aspiration. Small bilateral pleural effusions with adjacent lower lobe atelectasis. 3. Buckle fractures of bilateral anterior ribs, commonly seen with CPR. 4. No acute findings in the abdomen or pelvis. Large colonic stool burden suggesting constipation. Electronically Signed   By: Narda Rutherford M.D.   On: 05/27/2018 23:05    STUDIES:  2D echo pending  CULTURES: Blood cultures x2 Urine culture Sputum culture  ANTIBIOTICS: Given 1 dose of cefepime and vancomycin in the ED Start Zosyn per pharmacy  SIGNIFICANT EVENTS: 05/28/2018: Admitted  LINES/TUBES: ET tube Foley catheter NG tube Triple-lumen catheter A-line  DISCUSSION: 68 year old  female presenting with a PEA arrest secondary to acute CHF exacerbation and pulmonary edema with subsequent aspiration into airway. ASSESSMENT / PLAN:  PULMONARY A: Acute hypoxic and hypercarbic respiratory failure secondary to acute CHF exacerbation Acute pulmonary edema Aspiration pneumonitis Lactic acidosis P:   Full vent support with current settings ABG and chest x-ray as needed Nebulized bronchodilators VAP protocol Weaning trials as tolerated Antibiotics as above Sputum culture  CARDIOVASCULAR A:  PEA cardiac arrest Acute on chronic CHF exacerbation with reduced EF-30% CAD status post MI in September 2010 History of aortic dissection status post repair History of carotid endarterectomy with stent placement P:  Hemodynamic monitoring per ICU protocol Targeted temperature management CVP is every 4 hours Gentle diuresis Levophed as needed to maintain mean arterial blood pressure greater than 65 Strict I's and O's 2D echo Cardiology consult Cycle  cardiac enzymes  RENAL A:   No acute issues P:   Trend creatinine Monitor and replace electrolytes electrolytes  GASTROINTESTINAL A:   Constipation P:   Lactulose 30 g via NG x1 Dulcolax suppository PR daily as needed and senna daily as needed  INFECTIOUS A:   Aspiration pneumonitis Leukocytosis P:   Antibiotics as above Follow-up cultures Monitor fever curve  ENDOCRINE A:   Type 2 diabetes mellitus with hyperglycemia P:   Blood glucose monitoring with sliding scale insulin coverage Hemoglobin A1c with a.m. labs  NEUROLOGIC A:   Acute hypoxic encephalopathy secondary to cardiac arrest P:   RASS goal: 0 to -1 TTM Fentanyl and Versed for sedation Start CT head- baseline obtained and reviewed Neurology consult  Best Practice: Code Status: Full code Diet: N.p.o. GI prophylaxis: Protonix VTE prophylaxis: Subcu heparin  FAMILY  - Updates: No family at bedside.  Message left for patient's daughter Ashok Cordia.  Callback requested  Abbagayle Zaragoza S. [facility] ANP-BC Pulmonary and Critical Care Medicine [facility] Pager [phone] or [phone]  NB: This document was prepared using Dragon voice recognition software and may include unintentional dictation errors.    05/28/2018, 3:04 AM

## 2018-05-28 NOTE — Progress Notes (Signed)
Luci Bank, NP at bedside assessing patient and patient restless gagging and coughing against ET tube with abdominal accessory muscle use.  NP gave order for 100 mcg of fentanyl and 2 mg versed IV push now.

## 2018-05-28 NOTE — Progress Notes (Signed)
CRITICAL VALUE ALERT  Critical Value:  Troponin 0.25   Date & Time Notied: 05/28/18 1530  Provider Notified:MD Belia Heman

## 2018-05-28 NOTE — ED Notes (Signed)
md at bedside

## 2018-05-28 NOTE — Progress Notes (Signed)
eeg completed ° °

## 2018-05-28 NOTE — Procedures (Signed)
Arterial Catheter Insertion Procedure Note Desiree Mccall [id number] March 07, 1951  Procedure: Insertion of Arterial Catheter  Indications: Blood pressure monitoring and Frequent blood sampling  Procedure Details Consent: Unable to obtain consent because of emergent medical necessity. Time Out: Verified patient identification, verified procedure, site/side was marked, verified correct patient position, special equipment/implants available, medications/allergies/relevent history reviewed, required imaging and test results available.  Performed  Maximum sterile technique was used including antiseptics, cap, gloves, gown, hand hygiene, mask and sheet. Skin prep: Chlorhexidine; local anesthetic administered 20 gauge catheter was inserted into right femoral artery using the Seldinger technique. ULTRASOUND GUIDANCE USED: YES Evaluation Blood flow good; BP tracing good. Complications: No apparent complications. Procedure performed under direct supervision of Dr.Kasa. Ultrasound utilized for realtime vessel cannulation  Desiree Mccall 05/28/2018

## 2018-05-28 NOTE — Progress Notes (Signed)
PHARMACY CONSULT NOTE - FOLLOW UP  Pharmacy Consult for Electrolyte Monitoring and Replacement   Recent Labs: Potassium (mmol/L)  Date Value  05/28/2018 3.7   Magnesium (mg/dL)  Date Value  67/04/4579 2.1   Calcium (mg/dL)  Date Value  99/83/3825 7.0 (L)   Albumin (g/dL)  Date Value  05/39/7673 3.5   Phosphorus (mg/dL)  Date Value  41/93/7902 4.6   Sodium (mmol/L)  Date Value  05/28/2018 143     Assessment: Patient is a 68yo female that is s/p cardiac arrest. Patient was initiated on hypothermia protocol with pharmacy consult to manage electrolytes.  Mg, Phos, K wnl's  Corrected calcium 7.4  Goal of Therapy:  Maintain electrolytes within range  Plan:  Will give calcium gluconate 2g IV x 1  K=3.7, no supplementation at this time. Will need to be conservative with any supplementation to prevent hyperkalemia when patient is rewarmed. Will recheck BMET again with AM labs.   Desiree Mccall, PharmD, BCPS Clinical Pharmacist 05/28/2018 6:16 PM

## 2018-05-28 NOTE — Procedures (Signed)
ELECTROENCEPHALOGRAM REPORT   Patient: Desiree Mccall       Room #: IC11A-AA EEG No. ID: [id number] Age: 68 y.o.        Sex: female Referring Physician: Kasa Report Date:  05/28/2018        Interpreting Physician: Thana Farr  History: [name] is an 67 y.o. female s/p arrest  Medications:  Pepcid, Fentanyl, Lasix, Insulin, Versed, Protonix, Levophed  Conditions of Recording:  This is a 21 channel routine scalp EEG performed with bipolar and monopolar montages arranged in accordance to the international 10/20 system of electrode placement. One channel was dedicated to EKG recording.  The patient is in the intubated and sedated state.  Patient is on hypothermia protocol.  Description:  Artifact is prominent during the recording obscuring the background rhythm for the majority of the recording.  The only leads that can be interpreted are the central leads.  Slowing is noted at the central leads with polymorphic delta activity noted.  This is persistent throughout the recording when able to be evaluated.  The patient was not stimulated during the recording.   Hyperventilation and intermittent photic stimulation were not performed.   IMPRESSION: This is a technically difficult record due to the predominance of muscle and movement artifact.  Slowing can be noted centrally and is consistent with hypothermia and medication effect.   Thana Farr, MD Neurology [phone] 05/28/2018, 6:10 PM

## 2018-05-28 NOTE — Progress Notes (Signed)
PHARMACY CONSULT NOTE - FOLLOW UP  Pharmacy Consult for Electrolyte Monitoring and Replacement   Recent Labs: Potassium (mmol/L)  Date Value  05/28/2018 3.9   Magnesium (mg/dL)  Date Value  75/08/1831 2.1   Calcium (mg/dL)  Date Value  58/25/1898 8.5 (L)   Albumin (g/dL)  Date Value  42/01/3127 3.5   Phosphorus (mg/dL)  Date Value  11/88/6773 4.6   Sodium (mmol/L)  Date Value  05/28/2018 141     Assessment: Patient is a 68yo female that is s/p cardiac arrest. Patient was initiated on hypothermia protocol with pharmacy consult to manage electrolytes.  Goal of Therapy:  Maintain electrolytes within range  Plan:  K=3.9, no supplementation at this time. Will need to be conservative with any supplementation to prevent hyperkalemia when patient is rewarmed. Will recheck BMET at 18:00 and again with AM labs.  Clovia Cuff, PharmD, BCPS 05/28/2018 1:34 PM
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
## 2018-05-28 NOTE — Progress Notes (Signed)
Initial Nutrition Assessment  DOCUMENTATION CODES:   Obesity unspecified  INTERVENTION:  Initiate Vital High Protein at 40 mL/hr (960 mL goal daily volume) + Pro-Stat 30 mL TID per OGT. Provides 1260 kcal, 129 grams of protein, 806 mL H2O daily.  Provide liquid MVI daily per tube.  Provide minimum free water flush of 30 mL Q4hrs to maintain tube patency.  NUTRITION DIAGNOSIS:   Inadequate oral intake related to inability to eat as evidenced by NPO status.  GOAL:   Provide needs based on ASPEN/SCCM guidelines  MONITOR:   Vent status, Labs, Weight trends, TF tolerance, I & O's  REASON FOR ASSESSMENT:   Ventilator    ASSESSMENT:   68 year old female with PMHx of DM, CHF (EF 30-35%), hx aortic dissection s/p repair, hx MI 2010 who was admitted 1/26 after witnessed PEA arrest, received 1 round of epinephrine and CPR and then obtained ROSC, was intubated for severe hypoxic and hypercapnic respiratory failure and started on 36 C TTM.   Patient intubated and sedated. On PRVC mode with FiO2 40% and PEEP 5 cmH2O. Abdomen distended but not taut. Last BM 05/28/2018 per chart but no documented BM characteristics yet. Per chart patient was 110.2-113.8 kg in 2016 and was 105.9 kg on 04/16/2018. Currently 104.1 kg (229.5 lbs).  Enteral Access: 18 Fr. OGT placed 1/27; terminates in stomach per chest x-ray 1/27; 64 cm at corner of mouth  MAP: 58-103 mmHg  Patient is currently intubated on ventilator support Ve: 10 L/min Temp (24hrs), Avg:98.7 F (37.1 C), Min:93.6 F (34.2 C), Max:99.8 F (37.7 C)  Propofol: N/A  Medications reviewed and include: Lasix 20 mg daily IV, Novolog 0-15 units Q4hrs, pantoprazole, famotidine, fentanyl gtt, Versed gtt, norepinephrine gtt at 2 mcg/min.  Labs reviewed: CBG 175.  I/O: 550 mL UOP overnight  Patient does not meet criteria for malnutrition.  Discussed with RN and on rounds. Plan is to start tube feeds today.  NUTRITION - FOCUSED PHYSICAL  EXAM:    Most Recent Value  Orbital Region  No depletion  Upper Arm Region  No depletion  Thoracic and Lumbar Region  No depletion  Buccal Region  Unable to assess  Temple Region  No depletion  Clavicle Bone Region  No depletion  Clavicle and Acromion Bone Region  No depletion  Scapular Bone Region  Unable to assess  Dorsal Hand  No depletion  Patellar Region  No depletion  Anterior Thigh Region  No depletion  Posterior Calf Region  No depletion  Edema (RD Assessment)  None  Hair  Reviewed  Eyes  Unable to assess  Mouth  Unable to assess  Skin  Reviewed  Nails  Reviewed     Diet Order:   Diet Order            Diet NPO time specified  Diet effective now             EDUCATION NEEDS:   No education needs have been identified at this time  Skin:  Skin Assessment: Reviewed RN Assessment  Last BM:  05/28/2018 per chart (no documented characteristics)  Height:   Ht Readings from Last 1 Encounters:  05/27/18 5\' 8"  (1.727 m)   Weight:   Wt Readings from Last 1 Encounters:  05/28/18 104.1 kg   Ideal Body Weight:  63.6 kg  BMI:  Body mass index is 34.9 kg/m.  Estimated Nutritional Needs:   Kcal:  1145-1457 (11-14 kcal/kg)  Protein:  127 grams (2 grams/kg IBW)  Fluid:  1.6-1.9 L/day (25-30 mL/kg IBW)  Willey Blade, MS, RD, LDN Office: [phone] Pager: [phone] After Hours/Weekend Pager: [phone]

## 2018-05-28 NOTE — Progress Notes (Signed)
Spoke with Dr. Belia Heman and MD gave order to change patient to the 36 degree temperature management protocol.

## 2018-05-28 NOTE — ED Notes (Signed)
Pt had a large bowel movement. This RN and daughter cleaned pt.

## 2018-05-28 NOTE — Procedures (Signed)
Central Venous Catheter Insertion Procedure Note Desiree Mccall [id number] 09/27/1950  Procedure: Insertion of Central Venous Catheter Indications: Assessment of intravascular volume, Drug and/or fluid administration and Frequent blood sampling  Procedure Details Consent: Unable to obtain consent because of emergent medical necessity. Time Out: Verified patient identification, verified procedure, site/side was marked, verified correct patient position, special equipment/implants available, medications/allergies/relevent history reviewed, required imaging and test results available.  Performed  Maximum sterile technique was used including antiseptics, cap, gloves, gown, hand hygiene, mask and sheet. Skin prep: Chlorhexidine; local anesthetic administered A antimicrobial bonded/coated triple lumen catheter was placed in the right internal jugular vein using the Seldinger technique.  Evaluation Blood flow good Complications: No apparent complications Patient did tolerate procedure well. Chest X-ray ordered to verify placement.  CXR: normal.  Procedure performed under direct supervision of Dr.Kasa. Ultrasound utilized for realtime vessel cannulation  Desiree S. Desiree Mccall ANP-BC Pulmonary and Critical Care Medicine [facility] Pager [phone] or [phone]  NB: This document was prepared using Dragon voice recognition software and may include unintentional dictation errors.    Desiree Mccall 05/28/2018, 7:28 AM

## 2018-05-28 NOTE — H&P (Signed)
[facility] [place] at [facility]   PATIENT NAME: Desiree Mccall    MR#:  [id number]  DATE OF BIRTH:  07/16/1950  DATE OF ADMISSION:  05/27/2018  PRIMARY CARE PHYSICIAN: Abram SanderAdamo, Elena M, MD   REQUESTING/REFERRING PHYSICIAN: Don PerkingVeronese, MD  CHIEF COMPLAINT:   Chief Complaint  Patient presents with  . Cardiac Arrest    HISTORY OF PRESENT ILLNESS:  Desiree Mccall  is a 68 y.o. female who presents with chief complaint as above.  Patient presents to the ED status post cardiac arrest.  She was at her home when she became acutely short of breath.  She called EMS and when they arrived she very quickly became unresponsive and turned "purple".  Per EMS she was PEA arrest, and received 1 round of epinephrine and CPR and then obtained ROSC.  Here in the ED she was intubated postarrest and hospitalist were called for admission  PAST MEDICAL HISTORY:   Past Medical History:  Diagnosis Date  . Aortic dissection (HCC) 2010  . CHF (congestive heart failure) (HCC)   . Diabetes mellitus without complication (HCC)   . [name]   . Heart attack (HCC) sept. 25, 2010     PAST SURGICAL HISTORY:   Past Surgical History:  Procedure Laterality Date  . ABDOMINAL HYSTERECTOMY    . CARDIAC SURGERY    . CAROTID STENT  2010  . CHOLECYSTECTOMY    . STENT PLACE LEFT URETER ([facility] HX)       SOCIAL HISTORY:   Social History   Tobacco Use  . Smoking status: Current Every Day Smoker    Packs/day: 0.50    Types: Cigarettes    Start date: 2012  . Smokeless tobacco: Never Used  Substance Use Topics  . Alcohol use: Yes    Comment: occasionally     FAMILY HISTORY:   Family History  Problem Relation Age of Onset  . Heart failure Mother   . Heart failure Father      DRUG ALLERGIES:  [name]  MEDICATIONS AT HOME:   Prior to Admission medications   Medication Sig Start Date End Date Taking? Authorizing Provider  buPROPion (WELLBUTRIN XL) 150 MG  24 hr tablet Take 1 tablet by mouth every evening.    [provider]  clopidogrel (PLAVIX) 75 MG tablet Take 1 tablet by mouth every morning. 04/01/14   [provider]  FLUoxetine (PROZAC) 20 MG capsule Take 2 capsules by mouth 2 (two) times daily. 03/12/15   [provider]  Fluticasone Furoate (ARNUITY ELLIPTA) 100 MCG/ACT AEPB Inhale 1 puff into the lungs daily. 04/16/18   Salena SanerGonzalez, Carmen L, MD  furosemide (LASIX) 40 MG tablet Take 1 tablet by mouth every morning. 09/25/13   [provider]  insulin detemir (LEVEMIR) 100 UNIT/ML injection Inject 20 Units into the skin 2 (two) times daily.     [provider]  metFORMIN (GLUCOPHAGE) 1000 MG tablet Take 1 tablet by mouth 2 (two) times daily. 02/05/15   [provider]  montelukast (SINGULAIR) 10 MG tablet Take 1 tablet (10 mg total) by mouth daily. 04/16/18 07/15/18  Salena SanerGonzalez, Carmen L, MD  spironolactone (ALDACTONE) 25 MG tablet Take 1 tablet by mouth daily. 05/16/14   [provider]  warfarin (COUMADIN) 7.5 MG tablet Take 1 tablet by mouth daily.  07/08/10   [provider]    REVIEW OF SYSTEMS:  Review of Systems  Unable to perform ROS: Critical illness  VITAL SIGNS:   Vitals:   05/27/18 2307 05/27/18 2315 05/27/18 2330 05/27/18 2345  BP:  (!) 175/106 (!) 174/120 (!) 156/100  Pulse:  (!) 105  99  Resp:  20 (!) 24 16  Temp:  99.6 F (37.6 C) 99.7 F (37.6 C) 99.7 F (37.6 C)  SpO2:  91%  92%  Weight:      Height: 5\' 8"  (1.727 m)      Wt Readings from Last 3 Encounters:  05/27/18 109 kg  04/16/18 105.9 kg  08/02/16 109.3 kg    PHYSICAL EXAMINATION:  Physical Exam  Vitals reviewed. Constitutional: She appears well-developed and well-nourished. No distress.  HENT:  Head: Normocephalic and atraumatic.  Mouth/Throat: Oropharynx is clear and moist.  Eyes: Pupils are equal, round, and reactive to light. Conjunctivae and EOM are normal. No scleral icterus.   Neck: Normal range of motion. Neck supple. No JVD present. No thyromegaly present.  Cardiovascular: Normal rate, regular rhythm and intact distal pulses. Exam reveals no gallop and no friction rub.  No murmur heard. Respiratory: She has no wheezes. She has no rales.  Patient is intubated and mechanically ventilated  GI: Soft. Bowel sounds are normal. She exhibits no distension. There is no abdominal tenderness.  Musculoskeletal: Normal range of motion.        General: No edema.     Comments: No arthritis, no gout  Lymphadenopathy:    She has no cervical adenopathy.  Neurological:  Unable to fully assess due to critical illness  Skin: Skin is warm and dry. No rash noted. No erythema.  Psychiatric:  Unable to fully assess due to critical illness    LABORATORY PANEL:   CBC Recent Labs  Lab 05/27/18 2120  WBC 15.8*  HGB 11.9*  HCT 41.8  PLT 192   ------------------------------------------------------------------------------------------------------------------  Chemistries  Recent Labs  Lab 05/27/18 2120  NA 142  K 4.0  CL 106  CO2 20*  GLUCOSE 343*  BUN 18  CREATININE 0.96  CALCIUM 8.6*  AST 229*  ALT 135*  ALKPHOS 155*  BILITOT 0.7   ------------------------------------------------------------------------------------------------------------------  Cardiac Enzymes Recent Labs  Lab 05/27/18 2120  TROPONINI 0.04*   ------------------------------------------------------------------------------------------------------------------  RADIOLOGY:  Dg Chest Portable 1 View  Result Date: 05/27/2018 CLINICAL DATA:  Status post intubation EXAM: PORTABLE CHEST 1 VIEW COMPARISON:  08/02/2016 FINDINGS: Endotracheal tube is noted approximately 1 cm above the carina. It was subsequently withdrawn and reimaged now lying 4 cm above the carina. Changes of prior aortic stent graft are seen and stable. Defibrillator is again noted. Cardiac shadow is again enlarged. The lungs are  well aerated. Some patchy infiltrative changes are noted in the upper lobes bilaterally. No bony abnormality is noted. IMPRESSION: Endotracheal tube in satisfactory position. Patchy infiltrates in the upper lobes bilaterally Postsurgical changes. Electronically Signed   By: Alcide Clever M.D.   On: 05/27/2018 21:41   Ct Angio Chest/abd/pel For Dissection W And/or Wo Contrast  Result Date: 05/27/2018 CLINICAL DATA:  Cardiac arrest. EXAM: CT ANGIOGRAPHY CHEST, ABDOMEN AND PELVIS TECHNIQUE: Multidetector CT imaging through the chest, abdomen and pelvis was performed using the standard protocol during bolus administration of intravenous contrast. Multiplanar reconstructed images and MIPs were obtained and reviewed to evaluate the vascular anatomy. CONTRAST:  ISOVUE-370 IOPAMIDOL (ISOVUE-370) INJECTION 76% COMPARISON:  Radiograph earlier this day. Chest in abdominal CT dissection protocol 09/19/2009. Report from dissection protocol CT at an outside institution 05/05/2017 FINDINGS: CTA CHEST FINDINGS Cardiovascular: Redemonstration of type B aortic dissection  post endovascular stent graft repair. Graft regional nodes at the level of the left subclavian which is occluded as described previously. There is distal reconstitution of the subclavian, not well assessed due to streak artifact from venous contrast in the axilla. Brachiocephalic and left common carotid artery remain patent. Ascending aorta is normal in caliber. Embolization coils the level of the distal arch lies aortic isthmus, as described on prior. The endovascular stent remains patent. False lumen surrounding the stent is thrombosed which was described previously, residual luminal dimension of 5.0 x 4.2 cm, previously reported 4.8 x 4.2 cm. Immediately distal to the stent graft in the descending thoracic aorta is focal opacification of the false lumen distal to this outpouching the false lumen is thrombosed, which was described previously. Pacemaker in  place. Mild cardiomegaly. No central pulmonary embolus. No pericardial effusion. Mediastinum/Nodes: Prominent soft tissue density at the right hilum likely lymph nodes, not well assessed due to adjacent lung consolidation. Otherwise no evidence of adenopathy. Esophagus is decompressed. Endotracheal tube tip above the carina. Lungs/Pleura: Perihilar ground-glass opacities, likely pulmonary edema. Dense consolidation in the dependent right upper lobe with air bronchograms small bilateral pleural effusions with adjacent compressive atelectasis, greater on the right. There is septal thickening the apices in the bases consistent with pulmonary edema. 7 mm right middle lobe pulmonary nodule, unchanged from 2011 and considered benign. Probable debris in the right mainstem bronchus causing luminal narrowing to the lower lobe. No pneumothorax. Musculoskeletal: Buckle fractures of anterior lower ribs, commonly seen with CPR. No sternal fracture. Thoracic spine is intact. Review of the MIP images confirms the above findings. CTA ABDOMEN AND PELVIS FINDINGS VASCULAR Aorta: Chronic dissection, at the diaphragmatic hiatus there is opacification of the false lumen. Dissection extends to the iliac bifurcation. Flow within both the true and false lumen to the iliac bifurcation. Thrombus within the false lumen infrarenal E, as described on prior exam. 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 No periaortic stranding to suggest rupture. Celiac: Patent arising from the true lumen.  No dissection. SMA: Patent arising from the true lumen.  No dissection. Renals: 2 right renal arteries which received flow from the true lumen. Left renal artery receives flow from both the true and false lumen with early luminal branching. IMA: Patent receiving flow from the true lumen. Inflow: Bilateral common iliac stents are in place and remain patent. Dissection extends into the left common iliac artery along the stent proximally  and persisting beyond the stent, as described previously. Dissection flap extends to the proximal-most left internal iliac artery. Normal flow within both internal and external iliac arteries. Veins: No obvious venous abnormality within the limitations of this arterial phase study. Review of the MIP images confirms the above findings. NON-VASCULAR Hepatobiliary: No focal hepatic abnormality on arterial phase exam. Postcholecystectomy without biliary dilatation. Pancreas: No ductal dilatation or inflammation. Spleen: Normal in size without focal abnormality. Adrenals/Urinary Tract: Normal adrenal glands. Homogeneous enhancement of both kidneys without hydronephrosis. Urinary bladder is decompressed by Foley catheter. Stomach/Bowel: Ingested material in the stomach. No gastric wall thickening. No small bowel obstruction or inflammatory change. Moderate stool burden throughout the entire colon. No colonic wall thickening or inflammation. There is sigmoid colonic tortuosity. No abnormal rectal distention. The appendix is not visualized. Lymphatic: No abdominopelvic adenopathy. Reproductive: Status post hysterectomy. No adnexal masses. Other: No free air free fluid. Musculoskeletal: Degenerative disc disease at L5-S1. There are no acute or suspicious osseous abnormalities. Review of the MIP images confirms  the above findings. IMPRESSION: 1. 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 Thoracic dissection extends into the abdominal aorta through the left common iliac artery as before. No evidence of aortic rupture or acute findings. 2. Cardiomegaly with pulmonary edema. Dense consolidation in the right upper lobe with debris in the right mainstem bronchus, highly suggestive of aspiration. Small bilateral pleural effusions with adjacent lower lobe atelectasis. 3. Buckle fractures of bilateral  anterior ribs, commonly seen with CPR. 4. No acute findings in the abdomen or pelvis. Large colonic stool burden suggesting constipation. Electronically Signed   By: Narda RutherfordMelanie  Sanford M.D.   On: 05/27/2018 23:05    EKG:   Orders placed or performed during the hospital encounter of 05/27/18  . EKG 12-Lead  . EKG 12-Lead  . EKG 12-Lead  . EKG 12-Lead    IMPRESSION AND PLAN:  Principal Problem:   Cardiac arrest (HCC) -this was PEA arrest per EMS report, she received 1 round of epinephrine and CPR and obtained ROSC.  She was intubated here in the ED post arrest.  We will admit her to the ICU for postarrest care per protocol, consult intensivist Active Problems:   CAD (coronary artery disease) -continue home medications, postarrest care as above   Diabetes mellitus type 2, noninsulin dependent (HCC) -sliding scale insulin coverage   Chronic systolic CHF (congestive heart failure) (HCC) -continue home meds once the patient is taking oral intake  Chart review performed and case discussed with ED provider. Labs, imaging and/or ECG reviewed by provider and discussed with patient/family. Management plans discussed with the patient and/or family.  DVT PROPHYLAXIS: SubQ lovenox   GI PROPHYLAXIS:  None  ADMISSION STATUS: Inpatient     CODE STATUS: Full Code Status History    Date Active Date Inactive Code Status Order ID Comments User Context   11/23/2014 0217 11/25/2014 1717 Full Code [id number]  Ramonita LabGouru, Aruna, MD Inpatient    Advance Directive Documentation     Most Recent Value  Type of Advance Directive  Healthcare Power of Attorney  Pre-existing out of facility DNR order (yellow form or pink MOST form)  -  "MOST" Form in Place?  -      TOTAL CRITICAL CARE TIME TAKING CARE OF THIS PATIENT: 50 minutes.   Barney DrainDavid F Jahvon Gosline 05/28/2018, 12:50 AM  [facility]  Office  [phone]  CC: Primary care physician; Abram SanderAdamo, Elena M, MD  Note:  This document was prepared using  Dragon voice recognition software and may include unintentional dictation errors.

## 2018-05-28 NOTE — Progress Notes (Signed)
Patient Central monitoring changed more artifact even after adjustment. Patient leads adjusted and replaced, 12 lead EKG captured and MD Kasa made aware.

## 2018-05-28 NOTE — Consult Note (Signed)
Desiree Mccall is a 68 y.o. female  [id number]  Primary Cardiologist: Neoma Laming Reason for Consultation: Elevated troponin and cardiac arrest  HPI: 68 year old female presented to the hospital after cyanotic episode and cardiac arrest and ended up being intubated and unresponsive.  I was asked to evaluate the patient because of elevated troponin and abnormal EKG.   Review of Systems: Unable to get any history   Past Medical History:  Diagnosis Date  . Aortic dissection ([place]) 2010  . CHF (congestive heart failure) ([place])   . Diabetes mellitus without complication ([place])   . [name]   . Heart attack ([place]) sept. 25, 2010    Medications Prior to Admission  Medication Sig Dispense Refill  . clopidogrel (PLAVIX) 75 MG tablet Take 1 tablet by mouth every morning.    Marland Kitchen FLUoxetine (PROZAC) 20 MG capsule Take 2 capsules by mouth daily.     . furosemide (LASIX) 40 MG tablet Take 1 tablet by mouth every morning.    . insulin detemir (LEVEMIR) 100 UNIT/ML injection Inject 20 Units into the skin 2 (two) times daily.     . metFORMIN (GLUCOPHAGE) 1000 MG tablet Take 1 tablet by mouth 2 (two) times daily.    . montelukast (SINGULAIR) 10 MG tablet Take 1 tablet (10 mg total) by mouth daily. 30 tablet 2  . spironolactone (ALDACTONE) 25 MG tablet Take 1 tablet by mouth daily.    Marland Kitchen warfarin (COUMADIN) 7.5 MG tablet Take 1 tablet by mouth See admin instructions. Take one tablet daily on Sunday, Monday, Wednesday, Thursday, Friday and Saturday. Take one-half tablet by mouth daily on Tuesday.    . Fluticasone Furoate (ARNUITY ELLIPTA) 100 MCG/ACT AEPB Inhale 1 puff into the lungs daily. 1 each 3     . artificial tears  1 application Both Eyes E1R  . chlorhexidine gluconate (MEDLINE KIT)  15 mL Mouth Rinse BID  . fentaNYL (SUBLIMAZE) injection  50 mcg Intravenous Once  . furosemide  20 mg Intravenous Daily  . heparin  5,000 Units Subcutaneous Q8H  . insulin aspart  0-15 Units  Subcutaneous Q4H  . ipratropium-albuterol  3 mL Nebulization Q4H  . mouth rinse  15 mL Mouth Rinse 10 times per day  . midazolam  2 mg Intravenous Once  . pantoprazole (PROTONIX) IV  40 mg Intravenous QHS  . sodium chloride flush  10-40 mL Intracatheter Q12H    Infusions: . sodium chloride Stopped (05/28/18 0513)  . cisatracurium (NIMBEX) infusion 1 mcg/kg/min (05/28/18 0700)  . famotidine (PEPCID) IV Stopped (05/28/18 0511)  . fentaNYL infusion INTRAVENOUS 50 mcg/hr (05/28/18 0305)  . midazolam (VERSED) infusion 2 mg/hr (05/28/18 0700)  . norepinephrine (LEVOPHED) Adult infusion 2 mcg/min (05/28/18 0700)    [name]  Social History   Socioeconomic History  . Marital status: Divorced    Spouse name: Not on file  . Number of children: Not on file  . Years of education: Not on file  . Highest education level: Not on file  Occupational History  . Not on file  Social Needs  . Financial resource strain: Not on file  . Food insecurity:    Worry: Not on file    Inability: Not on file  . Transportation needs:    Medical: Not on file    Non-medical: Not on file  Tobacco Use  . Smoking status: Current Every Day Smoker    Packs/day: 0.50    Types: Cigarettes    Start date:  2012  . Smokeless tobacco: Never Used  Substance and Sexual Activity  . Alcohol use: Yes    Comment: occasionally  . Drug use: No  . Sexual activity: Not on file  Lifestyle  . Physical activity:    Days per week: Not on file    Minutes per session: Not on file  . Stress: Not on file  Relationships  . Social connections:    Talks on phone: Not on file    Gets together: Not on file    Attends religious service: Not on file    Active member of club or organization: Not on file    Attends meetings of clubs or organizations: Not on file    Relationship status: Not on file  . Intimate partner violence:    Fear of current or ex partner: Not on file    Emotionally abused: Not on file     Physically abused: Not on file    Forced sexual activity: Not on file  Other Topics Concern  . Not on file  Social History Narrative  . Not on file    Family History  Problem Relation Age of Onset  . Heart failure Mother   . Heart failure Father     PHYSICAL EXAM: Vitals:   05/28/18 0800 05/28/18 0900  BP: 122/64 119/68  Pulse: (!) 55 (!) 51  Resp: 20 20  Temp:  (!) 93.6 F (34.2 C)  SpO2: 100% 100%     Intake/Output Summary (Last 24 hours) at 05/28/2018 1012 Last data filed at 05/28/2018 0700 Gross per 24 hour  Intake 225.08 ml  Output 550 ml  Net -324.92 ml    General:  Well appearing. No respiratory difficulty HEENT: normal Neck: supple. no JVD. Carotids 2+ bilat; no bruits. No lymphadenopathy or thryomegaly appreciated. Cor: PMI nondisplaced. Regular rate & rhythm. No rubs, gallops or murmurs. Lungs: clear Abdomen: soft, nontender, nondistended. No hepatosplenomegaly. No bruits or masses. Good bowel sounds. Extremities: no cyanosis, clubbing, rash, edema Neuro: alert & oriented x 3, cranial nerves grossly intact. moves all 4 extremities w/o difficulty. Affect pleasant.  ECG: Sinus rhythm with nonspecific intraventricular conduction delay and residual ST elevation in the anterolateral leads but millimeter  Results for orders placed or performed during the hospital encounter of 05/27/18 (from the past 24 hour(s))  CBC with Differential/Platelet     Status: Abnormal   Collection Time: 05/27/18  9:20 PM  Result Value Ref Range   WBC 15.8 (H) 4.0 - 10.5 K/uL   RBC 4.49 3.87 - 5.11 MIL/uL   Hemoglobin 11.9 (L) 12.0 - 15.0 g/dL   HCT 41.8 36.0 - 46.0 %   MCV 93.1 80.0 - 100.0 fL   MCH 26.5 26.0 - 34.0 pg   MCHC 28.5 (L) 30.0 - 36.0 g/dL   RDW 15.9 (H) 11.5 - 15.5 %   Platelets 192 150 - 400 K/uL   nRBC 0.2 0.0 - 0.2 %   Neutrophils Relative % 35 %   Neutro Abs 5.4 1.7 - 7.7 K/uL   Lymphocytes Relative 56 %   Lymphs Abs 9.0 (H) 0.7 - 4.0 K/uL   Monocytes  Relative 5 %   Monocytes Absolute 0.8 0.1 - 1.0 K/uL   Eosinophils Relative 1 %   Eosinophils Absolute 0.1 0.0 - 0.5 K/uL   Basophils Relative 0 %   Basophils Absolute 0.1 0.0 - 0.1 K/uL   WBC Morphology MORPHOLOGY UNREMARKABLE    Smear Review Normal platelet morphology  Immature Granulocytes 3 %   Abs Immature Granulocytes 0.40 (H) 0.00 - 0.07 K/uL   Schistocytes PRESENT   Comprehensive metabolic panel     Status: Abnormal   Collection Time: 05/27/18  9:20 PM  Result Value Ref Range   Sodium 142 135 - 145 mmol/L   Potassium 4.0 3.5 - 5.1 mmol/L   Chloride 106 98 - 111 mmol/L   CO2 20 (L) 22 - 32 mmol/L   Glucose, Bld 343 (H) 70 - 99 mg/dL   BUN 18 8 - 23 mg/dL   Creatinine, Ser 0.96 0.44 - 1.00 mg/dL   Calcium 8.6 (L) 8.9 - 10.3 mg/dL   Total Protein 6.8 6.5 - 8.1 g/dL   Albumin 3.5 3.5 - 5.0 g/dL   AST 229 (H) 15 - 41 U/L   ALT 135 (H) 0 - 44 U/L   Alkaline Phosphatase 155 (H) 38 - 126 U/L   Total Bilirubin 0.7 0.3 - 1.2 mg/dL   GFR calc non Af Amer >60 >60 mL/min   GFR calc Af Amer >60 >60 mL/min   Anion gap 16 (H) 5 - 15  Troponin I - ONCE - STAT     Status: Abnormal   Collection Time: 05/27/18  9:20 PM  Result Value Ref Range   Troponin I 0.04 (HH) <0.03 ng/mL  Brain natriuretic peptide     Status: Abnormal   Collection Time: 05/27/18  9:20 PM  Result Value Ref Range   B Natriuretic Peptide 1,217.0 (H) 0.0 - 100.0 pg/mL  Protime-[name]     Status: Abnormal   Collection Time: 05/27/18  9:20 PM  Result Value Ref Range   Prothrombin Time 24.2 (H) 11.4 - 15.2 seconds   [name] 2.21   Influenza panel by PCR (type A & B)     Status: None   Collection Time: 05/27/18  9:20 PM  Result Value Ref Range   Influenza A By PCR NEGATIVE NEGATIVE   Influenza B By PCR NEGATIVE NEGATIVE  Lactic acid, plasma     Status: Abnormal   Collection Time: 05/27/18  9:20 PM  Result Value Ref Range   Lactic Acid, Venous >11.0 (HH) 0.5 - 1.9 mmol/L  Urine Drug Screen, Qualitative ([facility] only)      Status: None   Collection Time: 05/27/18  9:20 PM  Result Value Ref Range   Tricyclic, Ur Screen NONE DETECTED NONE DETECTED   Amphetamines, Ur Screen NONE DETECTED NONE DETECTED   MDMA (Ecstasy)Ur Screen NONE DETECTED NONE DETECTED   Cocaine Metabolite,Ur [place] NONE DETECTED NONE DETECTED   Opiate, Ur Screen NONE DETECTED NONE DETECTED   Phencyclidine (PCP) Ur S NONE DETECTED NONE DETECTED   Cannabinoid 50 Ng, Ur [place] NONE DETECTED NONE DETECTED   Barbiturates, Ur Screen NONE DETECTED NONE DETECTED   Benzodiazepine, Ur Scrn NONE DETECTED NONE DETECTED   Methadone Scn, Ur NONE DETECTED NONE DETECTED  Urinalysis, Complete w Microscopic     Status: Abnormal   Collection Time: 05/27/18  9:20 PM  Result Value Ref Range   Color, Urine YELLOW (A) YELLOW   APPearance CLEAR (A) CLEAR   Specific Gravity, Urine 1.020 1.005 - 1.030   pH 7.0 5.0 - 8.0   Glucose, UA NEGATIVE NEGATIVE mg/dL   Hgb urine dipstick NEGATIVE NEGATIVE   Bilirubin Urine NEGATIVE NEGATIVE   Ketones, ur NEGATIVE NEGATIVE mg/dL   Protein, ur NEGATIVE NEGATIVE mg/dL   Nitrite NEGATIVE NEGATIVE   Leukocytes, UA NEGATIVE NEGATIVE   RBC / HPF 0-5  0 - 5 RBC/hpf   WBC, UA 0-5 0 - 5 WBC/hpf   Bacteria, UA NONE SEEN NONE SEEN   Squamous Epithelial / LPF 0-5 0 - 5   Mucus PRESENT   Blood gas, arterial ([facility] ONLY)     Status: Abnormal   Collection Time: 05/27/18  9:32 PM  Result Value Ref Range   FIO2 1.00    Delivery systems VENTILATOR    Mode ASSIST CONTROL    VT 500 mL   Peep/cpap 5.0 cm H20   pH, Arterial 7.03 (LL) 7.350 - 7.450   pCO2 arterial 65 (H) 32.0 - 48.0 mmHg   pO2, Arterial 162 (H) 83.0 - 108.0 mmHg   Bicarbonate 17.2 (L) 20.0 - 28.0 mmol/L   Acid-base deficit 14.3 (H) 0.0 - 2.0 mmol/L   O2 Saturation 98.6 %   Patient temperature 37.0    Collection site RIGHT BRACHIAL    Sample type ARTERIAL DRAW    Mechanical Rate 20   Blood culture (routine x 2)     Status: None (Preliminary result)   Collection Time:  05/27/18  9:50 PM  Result Value Ref Range   Specimen Description BLOOD RIGHT HAND    Special Requests Blood Culture adequate volume    Culture      NO GROWTH < 12 HOURS Performed at [facility], [address] [place], [place] [address]    Report Status PENDING   Blood culture (routine x 2)     Status: None (Preliminary result)   Collection Time: 05/27/18 10:57 PM  Result Value Ref Range   Specimen Description BLOOD LEFT ANTECUBITAL    Special Requests Blood Culture adequate volume    Culture      NO GROWTH < 12 HOURS Performed at [facility], [address] [place], [place] [address]    Report Status PENDING   MRSA PCR Screening     Status: None   Collection Time: 05/28/18  2:24 AM  Result Value Ref Range   MRSA by PCR NEGATIVE NEGATIVE  Troponin I - Now Then Q6H     Status: Abnormal   Collection Time: 05/28/18  3:51 AM  Result Value Ref Range   Troponin I 0.23 (HH) <0.03 ng/mL  Protime-[name] now and repeat in 8 hours     Status: Abnormal   Collection Time: 05/28/18  3:51 AM  Result Value Ref Range   Prothrombin Time 24.4 (H) 11.4 - 15.2 seconds   [name] 7.98   Basic metabolic panel     Status: Abnormal   Collection Time: 05/28/18  3:51 AM  Result Value Ref Range   Sodium 141 135 - 145 mmol/L   Potassium 4.3 3.5 - 5.1 mmol/L   Chloride 109 98 - 111 mmol/L   CO2 26 22 - 32 mmol/L   Glucose, Bld 208 (H) 70 - 99 mg/dL   BUN 23 8 - 23 mg/dL   Creatinine, Ser 0.94 0.44 - 1.00 mg/dL   Calcium 8.2 (L) 8.9 - 10.3 mg/dL   GFR calc non Af Amer >60 >60 mL/min   GFR calc Af Amer >60 >60 mL/min   Anion gap 6 5 - 15  Procalcitonin     Status: None   Collection Time: 05/28/18  3:51 AM  Result Value Ref Range   Procalcitonin 2.87 ng/mL  Magnesium     Status: None   Collection Time: 05/28/18  3:51 AM  Result Value Ref Range   Magnesium 2.0 1.7 -  2.4 mg/dL  Phosphorus     Status: None   Collection Time: 05/28/18  3:51 AM  Result Value Ref Range   Phosphorus  4.6 2.5 - 4.6 mg/dL  Arterial Blood Gas PRN     Status: Abnormal   Collection Time: 05/28/18  5:00 AM  Result Value Ref Range   FIO2 0.60    Delivery systems VENTILATOR    Mode PRESSURE REGULATED VOLUME CONTROL    VT 500 mL   LHR 20 resp/min   Peep/cpap 5.0 cm H20   pH, Arterial 7.34 (L) 7.350 - 7.450   pCO2 arterial 47 32.0 - 48.0 mmHg   pO2, Arterial 88 83.0 - 108.0 mmHg   Bicarbonate 25.4 20.0 - 28.0 mmol/L   Acid-base deficit 0.8 0.0 - 2.0 mmol/L   O2 Saturation 96.2 %   Patient temperature 37.0    Collection site RIGHT RADIAL    Sample type ARTERIAL DRAW    Allens test (pass/fail) PASS PASS  Basic metabolic panel     Status: Abnormal   Collection Time: 05/28/18  6:15 AM  Result Value Ref Range   Sodium 142 135 - 145 mmol/L   Potassium 4.9 3.5 - 5.1 mmol/L   Chloride 109 98 - 111 mmol/L   CO2 22 22 - 32 mmol/L   Glucose, Bld 188 (H) 70 - 99 mg/dL   BUN 23 8 - 23 mg/dL   Creatinine, Ser 0.90 0.44 - 1.00 mg/dL   Calcium 8.5 (L) 8.9 - 10.3 mg/dL   GFR calc non Af Amer >60 >60 mL/min   GFR calc Af Amer >60 >60 mL/min   Anion gap 11 5 - 15  Hemoglobin A1c     Status: Abnormal   Collection Time: 05/28/18  6:15 AM  Result Value Ref Range   Hgb A1c MFr Bld 7.0 (H) 4.8 - 5.6 %   Mean Plasma Glucose 154.2 mg/dL  Glucose, capillary     Status: Abnormal   Collection Time: 05/28/18  9:23 AM  Result Value Ref Range   Glucose-Capillary 175 (H) 70 - 99 mg/dL   Comment 1 Document in Chart    Ct Head Wo Contrast  Result Date: 05/28/2018 CLINICAL DATA:  Recent cardiac arrest. EXAM: CT HEAD WITHOUT CONTRAST TECHNIQUE: Contiguous axial images were obtained from the base of the skull through the vertex without intravenous contrast. COMPARISON:  Head CT 08/02/2016 FINDINGS: Brain: There is no mass, hemorrhage or extra-axial collection. The size and configuration of the ventricles and extra-axial CSF spaces are normal. There is hypoattenuation of the white matter, most commonly indicating  chronic small vessel disease. Vascular: There is retained intravascular contrast material secondary to earlier CTA of the chest. Skull: The visualized skull base, calvarium and extracranial soft tissues are normal. Sinuses/Orbits: No fluid levels or advanced mucosal thickening of the visualized paranasal sinuses. No mastoid or middle ear effusion. The orbits are normal. IMPRESSION: No acute intracranial abnormality. Electronically Signed   By: Ulyses Jarred M.D.   On: 05/28/2018 03:39   Dg Chest Port 1 View  Result Date: 05/28/2018 CLINICAL DATA:  Orogastric tube placement, central line placement. EXAM: PORTABLE CHEST 1 VIEW COMPARISON:  Radiographs of May 27, 2018. FINDINGS: Stable cardiomegaly. Status post stent graft repair of descending thoracic aorta. Endotracheal and nasogastric tubes are unchanged in position. Left-sided pacemaker is unchanged in position. No pneumothorax or significant pleural effusion is noted. Minimal left basilar subsegmental atelectasis is noted. Right lung is clear. Bony thorax is unremarkable. IMPRESSION: Minimal left  basilar subsegmental atelectasis. Stable support apparatus. Electronically Signed   By: Marijo Conception, M.D.   On: 05/28/2018 08:07   Dg Chest Portable 1 View  Result Date: 05/27/2018 CLINICAL DATA:  Status post intubation EXAM: PORTABLE CHEST 1 VIEW COMPARISON:  08/02/2016 FINDINGS: Endotracheal tube is noted approximately 1 cm above the carina. It was subsequently withdrawn and reimaged now lying 4 cm above the carina. Changes of prior aortic stent graft are seen and stable. Defibrillator is again noted. Cardiac shadow is again enlarged. The lungs are well aerated. Some patchy infiltrative changes are noted in the upper lobes bilaterally. No bony abnormality is noted. IMPRESSION: Endotracheal tube in satisfactory position. Patchy infiltrates in the upper lobes bilaterally Postsurgical changes. Electronically Signed   By: Inez Catalina M.D.   On: 05/27/2018  21:41   Ct Angio Chest/abd/pel For Dissection W And/or Wo Contrast  Result Date: 05/27/2018 CLINICAL DATA:  Cardiac arrest. EXAM: CT ANGIOGRAPHY CHEST, ABDOMEN AND PELVIS TECHNIQUE: Multidetector CT imaging through the chest, abdomen and pelvis was performed using the standard protocol during bolus administration of intravenous contrast. Multiplanar reconstructed images and MIPs were obtained and reviewed to evaluate the vascular anatomy. CONTRAST:  173m ISOVUE-370 IOPAMIDOL (ISOVUE-370) INJECTION 76% COMPARISON:  Radiograph earlier this day. Chest in abdominal CT dissection protocol 09/19/2009. Report from dissection protocol CT at an outside institution 05/05/2017 FINDINGS: CTA CHEST FINDINGS Cardiovascular: Redemonstration of type B aortic dissection post endovascular stent graft repair. Graft regional nodes at the level of the left subclavian which is occluded as described previously. There is distal reconstitution of the subclavian, not well assessed due to streak artifact from venous contrast in the axilla. Brachiocephalic and left common carotid artery remain patent. Ascending aorta is normal in caliber. Embolization coils the level of the distal arch lies aortic isthmus, as described on prior. The endovascular stent remains patent. False lumen surrounding the stent is thrombosed which was described previously, residual luminal dimension of 5.0 x 4.2 cm, previously reported 4.8 x 4.2 cm. Immediately distal to the stent graft in the descending thoracic aorta is focal opacification of the false lumen distal to this outpouching the false lumen is thrombosed, which was described previously. Pacemaker in place. Mild cardiomegaly. No central pulmonary embolus. No pericardial effusion. Mediastinum/Nodes: Prominent soft tissue density at the right hilum likely lymph nodes, not well assessed due to adjacent lung consolidation. Otherwise no evidence of adenopathy. Esophagus is decompressed. Endotracheal tube tip  above the carina. Lungs/Pleura: Perihilar ground-glass opacities, likely pulmonary edema. Dense consolidation in the dependent right upper lobe with air bronchograms small bilateral pleural effusions with adjacent compressive atelectasis, greater on the right. There is septal thickening the apices in the bases consistent with pulmonary edema. 7 mm right middle lobe pulmonary nodule, unchanged from 2011 and considered benign. Probable debris in the right mainstem bronchus causing luminal narrowing to the lower lobe. No pneumothorax. Musculoskeletal: Buckle fractures of anterior lower ribs, commonly seen with CPR. No sternal fracture. Thoracic spine is intact. Review of the MIP images confirms the above findings. CTA ABDOMEN AND PELVIS FINDINGS VASCULAR Aorta: Chronic dissection, at the diaphragmatic hiatus there is opacification of the false lumen. Dissection extends to the iliac bifurcation. Flow within both the true and false lumen to the iliac bifurcation. Thrombus within the false lumen infrarenal E, as described on prior exam. 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 No periaortic stranding to suggest rupture. Celiac: Patent arising from the true lumen.  No dissection. SMA:  Patent arising from the true lumen.  No dissection. Renals: 2 right renal arteries which received flow from the true lumen. Left renal artery receives flow from both the true and false lumen with early luminal branching. IMA: Patent receiving flow from the true lumen. Inflow: Bilateral common iliac stents are in place and remain patent. Dissection extends into the left common iliac artery along the stent proximally and persisting beyond the stent, as described previously. Dissection flap extends to the proximal-most left internal iliac artery. Normal flow within both internal and external iliac arteries. Veins: No obvious venous abnormality within the limitations of this arterial phase study. Review of the MIP images  confirms the above findings. NON-VASCULAR Hepatobiliary: No focal hepatic abnormality on arterial phase exam. Postcholecystectomy without biliary dilatation. Pancreas: No ductal dilatation or inflammation. Spleen: Normal in size without focal abnormality. Adrenals/Urinary Tract: Normal adrenal glands. Homogeneous enhancement of both kidneys without hydronephrosis. Urinary bladder is decompressed by Foley catheter. Stomach/Bowel: Ingested material in the stomach. No gastric wall thickening. No small bowel obstruction or inflammatory change. Moderate stool burden throughout the entire colon. No colonic wall thickening or inflammation. There is sigmoid colonic tortuosity. No abnormal rectal distention. The appendix is not visualized. Lymphatic: No abdominopelvic adenopathy. Reproductive: Status post hysterectomy. No adnexal masses. Other: No free air free fluid. Musculoskeletal: Degenerative disc disease at L5-S1. There are no acute or suspicious osseous abnormalities. Review of the MIP images confirms the above findings. IMPRESSION: 1. 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 Thoracic dissection extends into the abdominal aorta through the left common iliac artery as before. No evidence of aortic rupture or acute findings. 2. Cardiomegaly with pulmonary edema. Dense consolidation in the right upper lobe with debris in the right mainstem bronchus, highly suggestive of aspiration. Small bilateral pleural effusions with adjacent lower lobe atelectasis. 3. Buckle fractures of bilateral anterior ribs, commonly seen with CPR. 4. No acute findings in the abdomen or pelvis. Large colonic stool burden suggesting constipation. Electronically Signed   By: Keith Rake M.D.   On: 05/27/2018 23:05     ASSESSMENT AND PLAN: Abnormal EKG with with possible recent anteroseptal wall MI with  borderline elevated troponin and cardiac arrest.  Patient is intubated unresponsive and has become DNR.  Will get echocardiogram to look at wall motion and ejection fraction and make further recommendation.  Advise IV heparin.  KHAN,SHAUKAT A

## 2018-05-28 NOTE — Progress Notes (Signed)
RN made Luci Bank, NP aware that cooling has began at this time and RN asked NP if nimbex should be started and NP stated "dont start nimbex drip unless patient begins shivering."

## 2018-05-28 NOTE — ED Notes (Signed)
Pt is awake but not fighting intubation. Dr Anne Hahn notified. Unable to increase sedation due to hypotension. RN in ICU also called and updated.

## 2018-05-28 NOTE — Progress Notes (Signed)
eLink Physician-Brief Progress Note Patient Name: Desiree Mccall DOB: Sep 15, 1950 MRN: [id number]   Date of Service  05/28/2018  HPI/Events of Note  68 yo female with EF = 30%. S/p cardiac arrest. Now with ROSC. Intubated and ventilated. PCCM asked to assume care in ICU. VSS.   eICU Interventions  No new orders.      Intervention Category Evaluation Type: New Patient Evaluation  [name] 05/28/2018, 2:42 AM

## 2018-05-28 NOTE — Progress Notes (Signed)
Pharmacy Antibiotic Note  Desiree Mccall is a 68 y.o. female admitted on 05/27/2018 with pneumonia.  Pharmacy has been consulted for Zosyn dosing.  Plan: Zosyn 3.375g IV q8h (4 hour infusion).  Height: 5\' 8"  (172.7 cm) Weight: 229 lb 8 oz (104.1 kg) [name] (Calculated) : 63.9  Temp (24hrs), Avg:99.4 F (37.4 C), Min:97 F (36.1 C), Max:99.8 F (37.7 C)  Recent Labs  Lab 05/27/18 2120 05/28/18 0351  WBC 15.8*  --   CREATININE 0.96 0.94  LATICACIDVEN >11.0*  --     Estimated Creatinine Clearance: 75.4 mL/min (by C-G formula based on SCr of 0.94 mg/dL).    [name]  Antimicrobials this admission: Zosyn 1/27  >>    >>   Dose adjustments this admission:   Microbiology results: 1/26 BCx: pending 1/26 UCx: pending  1/27 Sputum: pending  1/27 MRSA PCR: pending      1/26 CXR: bilateral patchy infiltrate 1/26 UA: pending  Thank you for allowing pharmacy to be a part of this patient's care.  Marcellus Pulliam S 05/28/2018 4:47 AM

## 2018-05-28 NOTE — Progress Notes (Signed)
Desiree Johns, NP aware that patient is awake with eyes open tracking and withdraws to pain as witnessed by Scherry Ran, RN [facility] nurse.  Luci Bank, NP came to bedside and patient did not do the same for NP assessment. NP gave order to continue with hypothermia protocol and to continue versed drip and fentanyl drips.

## 2018-05-29 DIAGNOSIS — I5022 Chronic systolic (congestive) heart failure: Secondary | ICD-10-CM

## 2018-05-29 LAB — PHOSPHORUS: Phosphorus: 3.1 mg/dL (ref 2.5–4.6)

## 2018-05-29 LAB — BLOOD GAS, ARTERIAL
Acid-Base Excess: 0.8 mmol/L (ref 0.0–2.0)
Bicarbonate: 27.1 mmol/L (ref 20.0–28.0)
FIO2: 0.3
MECHVT: 500 mL
O2 Saturation: 97.4 %
PATIENT TEMPERATURE: 37
PEEP: 5 cmH2O
RATE: 20 resp/min
pCO2 arterial: 49 mmHg — ABNORMAL HIGH (ref 32.0–48.0)
pH, Arterial: 7.35 (ref 7.350–7.450)
pO2, Arterial: 100 mmHg (ref 83.0–108.0)

## 2018-05-29 LAB — BASIC METABOLIC PANEL
Anion gap: 4 — ABNORMAL LOW (ref 5–15)
Anion gap: 3 — ABNORMAL LOW (ref 5–15)
Anion gap: 4 — ABNORMAL LOW (ref 5–15)
BUN: 29 mg/dL — AB (ref 8–23)
BUN: 26 mg/dL — ABNORMAL HIGH (ref 8–23)
BUN: 28 mg/dL — ABNORMAL HIGH (ref 8–23)
CHLORIDE: 109 mmol/L (ref 98–111)
CO2: 28 mmol/L (ref 22–32)
CO2: 31 mmol/L (ref 22–32)
CO2: 30 mmol/L (ref 22–32)
Calcium: 8.9 mg/dL (ref 8.9–10.3)
Calcium: 8.8 mg/dL — ABNORMAL LOW (ref 8.9–10.3)
Calcium: 8.5 mg/dL — ABNORMAL LOW (ref 8.9–10.3)
Chloride: 109 mmol/L (ref 98–111)
Chloride: 108 mmol/L (ref 98–111)
Creatinine, Ser: 0.54 mg/dL (ref 0.44–1.00)
Creatinine, Ser: 0.55 mg/dL (ref 0.44–1.00)
Creatinine, Ser: 0.46 mg/dL (ref 0.44–1.00)
GFR calc Af Amer: >60 mL/min (ref >60)
GFR calc Af Amer: >60 mL/min (ref >60)
GFR calc Af Amer: >60 mL/min (ref >60)
GFR calc non Af Amer: >60 mL/min (ref >60)
GFR calc non Af Amer: >60 mL/min (ref >60)
GFR calc non Af Amer: >60 mL/min (ref >60)
Glucose, Bld: 168 mg/dL — ABNORMAL HIGH (ref 70–99)
Glucose, Bld: 150 mg/dL — ABNORMAL HIGH (ref 70–99)
Glucose, Bld: 182 mg/dL — ABNORMAL HIGH (ref 70–99)
POTASSIUM: 2.9 mmol/L — AB (ref 3.5–5.1)
Potassium: 4.1 mmol/L (ref 3.5–5.1)
Potassium: 4 mmol/L (ref 3.5–5.1)
Sodium: 141 mmol/L (ref 135–145)
Sodium: 143 mmol/L (ref 135–145)
Sodium: 142 mmol/L (ref 135–145)

## 2018-05-29 LAB — URINE CULTURE: CULTURE: NO GROWTH

## 2018-05-29 LAB — HIV ANTIBODY (ROUTINE TESTING W REFLEX): HIV Screen 4th Generation wRfx: NONREACTIVE

## 2018-05-29 LAB — GLUCOSE, CAPILLARY
Glucose-Capillary: 142 mg/dL — ABNORMAL HIGH (ref 70–99)
Glucose-Capillary: 118 mg/dL — ABNORMAL HIGH (ref 70–99)
Glucose-Capillary: 120 mg/dL — ABNORMAL HIGH (ref 70–99)
Glucose-Capillary: 182 mg/dL — ABNORMAL HIGH (ref 70–99)
Glucose-Capillary: 192 mg/dL — ABNORMAL HIGH (ref 70–99)
Glucose-Capillary: 167 mg/dL — ABNORMAL HIGH (ref 70–99)

## 2018-05-29 LAB — MAGNESIUM: Magnesium: 2.1 mg/dL (ref 1.7–2.4)

## 2018-05-29 LAB — PROCALCITONIN: Procalcitonin: 2.18 ng/mL

## 2018-05-29 MED ORDER — STERILE WATER FOR INJECTION IJ SOLN
INTRAMUSCULAR | Status: AC
  Administered 2018-05-29: 10 mL
  Filled 2018-05-29: qty 10

## 2018-05-29 MED ORDER — STERILE WATER FOR INJECTION IJ SOLN
INTRAMUSCULAR | Status: AC
  Administered 2018-05-29: 08:00:00
  Filled 2018-05-29: qty 10

## 2018-05-29 MED ORDER — POTASSIUM CHLORIDE 10 MEQ/50ML IV SOLN
10.0000 meq | INTRAVENOUS | Status: AC
  Administered 2018-05-29 (×4): 10 meq via INTRAVENOUS
  Filled 2018-05-29 (×4): qty 50

## 2018-05-29 MED ORDER — POTASSIUM CHLORIDE 10 MEQ/100ML IV SOLN: 10.0000 meq | INTRAVENOUS | Status: DC

## 2018-05-29 MED ORDER — NOREPINEPHRINE 16 MG/250ML-% IV SOLN
0.0000 ug/min | INTRAVENOUS | Status: DC
  Administered 2018-05-29: 4 ug/min via INTRAVENOUS
  Filled 2018-05-29: qty 250

## 2018-05-29 MED ORDER — STERILE WATER FOR INJECTION IJ SOLN
INTRAMUSCULAR | Status: AC
  Administered 2018-05-29: 15:00:00
  Filled 2018-05-29: qty 10

## 2018-05-29 NOTE — Progress Notes (Addendum)
[employer] -  at [facility]   PATIENT NAME: Desiree Mccall    MR#:  [id number]  DATE OF BIRTH:  11/18/1950  SUBJECTIVE:  CHIEF COMPLAINT:   Chief Complaint  Patient presents with  . Cardiac Arrest   Pt is on vent and sedation. Off levophed drip. REVIEW OF SYSTEMS:  Review of Systems  Unable to perform ROS: Intubated    DRUG ALLERGIES:  [name] VITALS:  Blood pressure (!) 153/67, pulse 78, temperature 98.6 F (37 C), resp. rate 20, height 5\' 8"  (1.727 m), weight 104.7 kg, SpO2 95 %. PHYSICAL EXAMINATION:  Physical Exam Constitutional:      Appearance: She is ill-appearing.     Comments: Intubated. Morbid obesity.  HENT:     Head: Normocephalic.  Eyes:     Conjunctiva/sclera: Conjunctivae normal.     Pupils: Pupils are equal, round, and reactive to light.  Cardiovascular:     Rate and Rhythm: Normal rate and regular rhythm.     Heart sounds: No murmur.  Pulmonary:     Breath sounds: Rales present. No wheezing or rhonchi.  Abdominal:     General: There is no distension.     Tenderness: There is no guarding.     Comments: Diminished bowel sound.  Musculoskeletal:     Right lower leg: Edema present.     Left lower leg: Edema present.  Neurological:     Comments: Unable to exam.    LABORATORY PANEL:  Female CBC Recent Labs  Lab 05/28/18 1002  WBC 13.5*  HGB 12.0  HCT 38.9  PLT 140*   ------------------------------------------------------------------------------------------------------------------ Chemistries  Recent Labs  Lab 05/27/18 2120  05/29/18 0431  NA 142   < > 141  K 4.0   < > 2.9*  CL 106   < > 109  CO2 20*   < > 28  GLUCOSE 343*   < > 168*  BUN 18   < > 29*  CREATININE 0.96   < > 0.54  CALCIUM 8.6*   < > 8.9  MG  --    < > 2.1  AST 229*  --   --   ALT 135*  --   --   ALKPHOS 155*  --   --   BILITOT 0.7  --   --    < > = values in this interval not displayed.   RADIOLOGY:  Dg Chest Port 1  View  Result Date: 05/28/2018 CLINICAL DATA:  ET tube placement Patient is a 68yo female that is s/p cardiac arrest. Patient was initiated on hypothermia protocol with pharmacy consult to manage electrolytes EXAM: PORTABLE CHEST - 1 VIEW COMPARISON:  Earlier film of the same day FINDINGS: Endotracheal tube, nasogastric tube, right IJ central venous catheter stable in position. Stable left subclavian AICD. Thoracic stent graft and embolization coils appear unchanged. Relatively low lung volumes with some increase in pulmonary vascular congestion and possible mild interstitial edema. Heart size upper limits normal for technique. No effusion. No pneumothorax. Regional bones unremarkable. IMPRESSION: 1. Low volumes with some increase in pulmonary vascular congestion and possible mild interstitial edema. 2. Support hardware stable in position. Electronically Signed   By: Corlis Leak M.D.   On: 05/28/2018 13:51   ASSESSMENT AND PLAN:  Desiree Mccall  is a 68 y.o. female with PMH Aortic dissection, CHF, DM and heart attack.   Cardiac arrest [facility]) -this was PEA arrest per EMS report, she received 1 round of epinephrine  and CPR and obtained ROSC.  She was intubated here in the ED post arrest.   Cardiogenic shock. Off levophed drip.   Acute on chronic systolic CHF. Continue iv lasix.  Acute respiratory failure with hypoxia due to above. Continue vent and sedation.  Hypokalemia. KCl supplement.    CAD (coronary artery disease) -continue home medications, postarrest care as above.    Diabetes mellitus type 2, noninsulin dependent (HCC) -sliding scale insulin coverage   I discussed with Dr. Belia HemanKasa and Dr. Welton FlakesKhan All the records are reviewed and case discussed with Care Management/Social Worker. Management plans discussed with the patient's daughters and they are in agreement.  CODE STATUS: Full Code  TOTAL TIME TAKING CARE OF THIS PATIENT: 37 minutes.   More than 50% of the time was spent in  counseling/coordination of care: YES  POSSIBLE D/C IN ? DAYS, DEPENDING ON CLINICAL CONDITION.   Desiree PollackQing Desiree Mccall M.D on 05/29/2018 at 12:42 PM  Between 7am to 6pm - Pager - [phone]  After 6pm go to [email or web address] - Therapist, nutritionalpassword EPAS [facility]  [employer] [place] [facility]

## 2018-05-29 NOTE — Consult Note (Signed)
PULMONARY / CRITICAL CARE MEDICINE  Name: Desiree Mccall MRN: [id number] DOB: 08/05/1950    Referring Provider: Dr. Anne HahnWillis Reason for Referral: Cardiac arrest Brief patient description: 68 year old female admitted following a witnessed PEA arrest at home.  Now admitted to the ICU for targeted temperature management  CC  follow up cardiac arrest  HPI Remains on vent support Critically ill Multiorgan failure On hypothermia protocol   REVIEW OF SYSTEMS  PATIENT IS UNABLE TO PROVIDE COMPLETE REVIEW OF SYSTEM S DUE TO SEVERE CRITICAL ILLNESS AND ENCEPHALOPATHY     VITAL SIGNS: BP (!) 109/50   Pulse (!) 50   Temp (!) 96.1 F (35.6 C) (Bladder)   Resp 20   Ht 5\' 8"  (1.727 m)   Wt 104.7 kg   SpO2 97%   BMI 35.10 kg/m   HEMODYNAMICS:    VENTILATOR SETTINGS: Vent Mode: PRVC FiO2 (%):  [30 %-40 %] 40 % Set Rate:  [20 bmp] 20 bmp Vt Set:  [500 mL] 500 mL PEEP:  [5 cmH20] 5 cmH20 Pressure Support:  [10 cmH20] 10 cmH20 Plateau Pressure:  [17 cmH20-18 cmH20] 18 cmH20  INTAKE / OUTPUT: I/O last 3 completed shifts: In: 627.4 [I.V.:357.3; NG/GT:120; IV Piggyback:150.1] Out: 1300 [Urine:1300]  PHYSICAL EXAMINATION:  GENERAL:critically ill appearing, +resp distress HEAD: Normocephalic, atraumatic.  EYES: Pupils equal, round, reactive to light.  No scleral icterus.  MOUTH: Moist mucosal membrane. NECK: Supple. No thyromegaly. No nodules. No JVD.  PULMONARY: +rhonchi, +wheezing CARDIOVASCULAR: S1 and S2. Regular rate and rhythm. No murmurs, rubs, or gallops.  GASTROINTESTINAL: Soft, nontender, -distended. No masses. Positive bowel sounds. No hepatosplenomegaly.  MUSCULOSKELETAL: No swelling, clubbing, or edema.  NEUROLOGIC: obtunded SKIN:intact,warm,dry   LABS:  BMET Recent Labs  Lab 05/28/18 1228 05/28/18 1722 05/29/18 0431  NA 141 143 141  K 3.9 3.7 2.9*  CL 107 116* 109  CO2 26 22 28   BUN 23 24* 29*  CREATININE 0.83 0.67 0.54  GLUCOSE 199* 136* 168*     Electrolytes Recent Labs  Lab 05/28/18 0351  05/28/18 1002 05/28/18 1228 05/28/18 1722 05/29/18 0431  CALCIUM 8.2*   < > 8.5* 8.5* 7.0* 8.9  MG 2.0  --  2.1  --   --  2.1  PHOS 4.6  --   --   --   --  3.1   < > = values in this interval not displayed.    CBC Recent Labs  Lab 05/27/18 2120 05/28/18 1002  WBC 15.8* 13.5*  HGB 11.9* 12.0  HCT 41.8 38.9  PLT 192 140*    Coag's Recent Labs  Lab 05/27/18 2120 05/28/18 0351 05/28/18 0959  [name] 2.21 2.23 2.39    Sepsis Markers Recent Labs  Lab 05/27/18 2120 05/28/18 0351 05/28/18 1122 05/29/18 0431  LATICACIDVEN >11.0*  --  1.4  --   PROCALCITON  --  2.87  --  2.18    ABG Recent Labs  Lab 05/27/18 2132 05/28/18 0500 05/29/18 0500  PHART 7.03* 7.34* 7.35  PCO2ART 65* 47 49*  PO2ART 162* 88 100    Liver Enzymes Recent Labs  Lab 05/27/18 2120  AST 229*  ALT 135*  ALKPHOS 155*  BILITOT 0.7  ALBUMIN 3.5    Cardiac Enzymes Recent Labs  Lab 05/28/18 1002 05/28/18 1458 05/28/18 2110  TROPONINI 0.19* 0.25* 0.30*    Glucose Recent Labs  Lab 05/28/18 0923 05/28/18 1331 05/28/18 1653 05/28/18 1953 05/28/18 2354 05/29/18 0408  GLUCAP 175* 165* 148* 151* 177* 142*  Imaging Dg Chest Port 1 View  Result Date: 05/28/2018 CLINICAL DATA:  ET tube placement Patient is a 68yo female that is s/p cardiac arrest. Patient was initiated on hypothermia protocol with pharmacy consult to manage electrolytes EXAM: PORTABLE CHEST - 1 VIEW COMPARISON:  Earlier film of the same day FINDINGS: Endotracheal tube, nasogastric tube, right IJ central venous catheter stable in position. Stable left subclavian AICD. Thoracic stent graft and embolization coils appear unchanged. Relatively low lung volumes with some increase in pulmonary vascular congestion and possible mild interstitial edema. Heart size upper limits normal for technique. No effusion. No pneumothorax. Regional bones unremarkable. IMPRESSION: 1. Low  volumes with some increase in pulmonary vascular congestion and possible mild interstitial edema. 2. Support hardware stable in position. Electronically Signed   By: Corlis Leak M.D.   On: 05/28/2018 13:51   Dg Chest Port 1 View  Result Date: 05/28/2018 CLINICAL DATA:  Orogastric tube placement, central line placement. EXAM: PORTABLE CHEST 1 VIEW COMPARISON:  Radiographs of May 27, 2018. FINDINGS: Stable cardiomegaly. Status post stent graft repair of descending thoracic aorta. Endotracheal and nasogastric tubes are unchanged in position. Left-sided pacemaker is unchanged in position. No pneumothorax or significant pleural effusion is noted. Minimal left basilar subsegmental atelectasis is noted. Right lung is clear. Bony thorax is unremarkable. IMPRESSION: Minimal left basilar subsegmental atelectasis. Stable support apparatus. Electronically Signed   By: Lupita Raider, M.D.   On: 05/28/2018 08:07    STUDIES:  2D echo pending  CULTURES: Blood cultures x2 Urine culture Sputum culture  ANTIBIOTICS: Given 1 dose of cefepime and vancomycin in the ED Start Zosyn per pharmacy  SIGNIFICANT EVENTS: 05/28/2018: Admitted  LINES/TUBES: ET tube Foley catheter NG tube Triple-lumen catheter A-line  DISCUSSION:   68 year old female presenting with a PEA arrest secondary to acute CHF exacerbation and pulmonary edema with subsequent aspiration into airway.     ASSESSMENT / PLAN:  Severe Hypoxic and Hypercapnic Respiratory Failure Pulm edema and severe sCHF -continue Mechanical Ventilator support -continue Bronchodilator Therapy -Wean Fio2 and PEEP as tolerated -VAP/VENT bundle implementation   CARDIAC FAILURE-EF 30% Sudden cardiac arrest Poor prognosis On hypothermia protocol    GI/Nutrition GI PROPHYLAXIS as indicated DIET-->TF's as tolerated Constipation protocol as indicated   INFECTIOUS Stop all ABX    NEUROLOGY - intubated and sedated - minimal sedation to  achieve a RASS goal: -1  ELECTROLYTES -follow labs as needed -replace as needed -pharmacy consultation and following    DVT/GI PRX ordered TRANSFUSIONS AS NEEDED MONITOR FSBS ASSESS the need for LABS as needed   Critical Care Time devoted to patient care services described in this note is 45  minutes.   Overall, patient is critically ill, prognosis is guarded.  Patient with Multiorgan failure and at high risk for cardiac arrest and death.    Lucie Leather, M.D.  [facility]  Medical Director [facility] Medical Director [facility] Cardio-Pulmonary Department

## 2018-05-29 NOTE — Progress Notes (Signed)
SUBJECTIVE: Patient remains intubated and unresponsive   Vitals:   05/29/18 1156 05/29/18 1200 05/29/18 1245 05/29/18 1300  BP:  (!) 153/67 (!) 159/62 138/61  Pulse:  78 68 66  Resp:  20 20 20   Temp:    98.8 F (37.1 C)  TempSrc:    Bladder  SpO2: 95% 95% 95% 94%  Weight:      Height:        Intake/Output Summary (Last 24 hours) at 05/29/2018 1349 Last data filed at 05/29/2018 1236 Gross per 24 hour  Intake 2269.87 ml  Output 1435 ml  Net 834.87 ml    LABS: Basic Metabolic Panel: Recent Labs    05/28/18 0351  05/28/18 1002  05/28/18 1722 05/29/18 0431  NA 141   < > 141   < > 143 141  K 4.3   < > 3.8   < > 3.7 2.9*  CL 109   < > 108   < > 116* 109  CO2 26   < > 25   < > 22 28  GLUCOSE 208*   < > 207*   < > 136* 168*  BUN 23   < > 22   < > 24* 29*  CREATININE 0.94   < > 0.62   < > 0.67 0.54  CALCIUM 8.2*   < > 8.5*   < > 7.0* 8.9  MG 2.0  --  2.1  --   --  2.1  PHOS 4.6  --   --   --   --  3.1   < > = values in this interval not displayed.   Liver Function Tests: Recent Labs    05/27/18 2120  AST 229*  ALT 135*  ALKPHOS 155*  BILITOT 0.7  PROT 6.8  ALBUMIN 3.5   No results for input(s): LIPASE, AMYLASE in the last 72 hours. CBC: Recent Labs    05/27/18 2120 05/28/18 1002  WBC 15.8* 13.5*  NEUTROABS 5.4  --   HGB 11.9* 12.0  HCT 41.8 38.9  MCV 93.1 87.4  PLT 192 140*   Cardiac Enzymes: Recent Labs    05/28/18 1002 05/28/18 1458 05/28/18 2110  TROPONINI 0.19* 0.25* 0.30*   BNP: Invalid input(s): POCBNP D-Dimer: No results for input(s): DDIMER in the last 72 hours. Hemoglobin A1C: Recent Labs    05/28/18 0615  HGBA1C 7.0*   Fasting Lipid Panel: No results for input(s): CHOL, HDL, LDLCALC, TRIG, CHOLHDL, LDLDIRECT in the last 72 hours. Thyroid Function Tests: No results for input(s): TSH, T4TOTAL, T3FREE, THYROIDAB in the last 72 hours.  Invalid input(s): FREET3 Anemia Panel: No results for input(s): VITAMINB12, FOLATE, FERRITIN,  TIBC, IRON, RETICCTPCT in the last 72 hours.   PHYSICAL EXAM General: Well developed, well nourished, in no acute distress HEENT:  Normocephalic and atramatic Neck:  No JVD.  Lungs: Clear bilaterally to auscultation and percussion. Heart: HRRR . Normal S1 and S2 without gallops or murmurs.  Abdomen: Bowel sounds are positive, abdomen soft and non-tender  Msk:  Back normal, normal gait. Normal strength and tone for age. Extremities: No clubbing, cyanosis or edema.   Neuro: Alert and oriented X 3. Psych:  Good affect, responds appropriately  TELEMETRY: Sinus rhythm  ASSESSMENT AND PLAN: Cardiac arrest most likely due to arrhythmia with severe LV dysfunction with left ventricular ejection fraction 20 to 25% and residual ST elevation in the anterolateral leads suggestive of left ventricular aneurysm which may have been the cause of either ventricular fibrillation  or ventricular tachycardia.  I talked to the family and further work-up will be done once patient is extubated at this time patient is too unstable.  Principal Problem:   Cardiac arrest [facility]) Active Problems:   CAD (coronary artery disease)   Diabetes mellitus type 2, noninsulin dependent (HCC)   Chronic systolic CHF (congestive heart failure) (HCC)    Desiree Mccall A, MD, [facility] 05/29/2018 1:49 PM

## 2018-05-29 NOTE — Progress Notes (Signed)
PHARMACY CONSULT NOTE - FOLLOW UP  Pharmacy Consult for Electrolyte Monitoring and Replacement   Recent Labs: Potassium (mmol/L)  Date Value  05/29/2018 4.0   Magnesium (mg/dL)  Date Value  57/47/3403 2.1   Calcium (mg/dL)  Date Value  70/96/4383 8.5 (L)   Albumin (g/dL)  Date Value  81/84/0375 3.5   Phosphorus (mg/dL)  Date Value  43/60/6770 3.1   Sodium (mmol/L)  Date Value  05/29/2018 142     Assessment: Patient is a 68yo female that is s/p cardiac arrest. Patient was initiated on hypothermia protocol with pharmacy consult to manage electrolytes.  Patient received KCl IV x 4 this morning for low K of 2.9. Patient is now in rewarming phase.  Goal of Therapy:  Maintain electrolytes within range  Plan:  01/28 15:00 K 4.1, no additional supplementation at this time. Will check BMET at 21:00 and with AM labs.  01/28 20:37 K = 4.0  .  No additional K supplementation needed at this time.  Will recheck electrolytes on 1/29 with AM labs.   Tino Ronan D 05/29/2018 9:12 PM

## 2018-05-29 NOTE — Progress Notes (Signed)
PHARMACY CONSULT NOTE - FOLLOW UP  Pharmacy Consult for Electrolyte Monitoring and Replacement   Recent Labs: Potassium (mmol/L)  Date Value  05/29/2018 2.9 (L)   Magnesium (mg/dL)  Date Value  96/78/9381 2.1   Calcium (mg/dL)  Date Value  01/75/1025 8.9   Albumin (g/dL)  Date Value  85/27/7824 3.5   Phosphorus (mg/dL)  Date Value  23/53/6144 3.1   Sodium (mmol/L)  Date Value  05/29/2018 141     Assessment: Patient is a 68yo female that is s/p cardiac arrest. Patient was initiated on hypothermia protocol with pharmacy consult to manage electrolytes.  Mg, Phos, K wnl's  Corrected calcium 7.4  Goal of Therapy:  Maintain electrolytes within range  Plan:  01/28 @ 0430 K 2.9 will supplement w/ KCI 10 mEq IV x 4 50 mls via central line since patient has some increased pulmonary vascular so will try to limit fluids; also patient on tube feeds so expect K to be replaced fairly easy w/o addition fluid bags.  Thomasene Ripple, PharmD, BCPS Clinical Pharmacist 05/29/2018 6:31 AM

## 2018-05-29 NOTE — Progress Notes (Signed)
Pastoral Care Visit    05/29/18 1030  Clinical Encounter Type  Visited With Patient and family together  Visit Type Follow-up  Referral From Chaplain  Consult/Referral To Chaplain  Recommendations chap follow up w/ fam for next few days   Pt is nonresponsive due to intubation. Daughter Mitzi Davenport, son Lorne Skeens, and 3 others were here.  Chap visited w/ family and let them know that I was the one who called them from ED on Sunday night when mom was brought in.  Fam is present and supportive.  They are acutely aware of the seriousness of situation and reported that pt would not want to be on extended life saving measures.  Chap listened to pt's family. Family warned chap that brother Molly Maduro (50s) is problematic and to be aware.  Milinda Antis, [address]

## 2018-05-29 NOTE — Progress Notes (Signed)
PHARMACY CONSULT NOTE - FOLLOW UP  Pharmacy Consult for Electrolyte Monitoring and Replacement   Recent Labs: Potassium (mmol/L)  Date Value  05/29/2018 4.1   Magnesium (mg/dL)  Date Value  16/01/9603 2.1   Calcium (mg/dL)  Date Value  54/12/8117 8.8 (L)   Albumin (g/dL)  Date Value  14/78/2956 3.5   Phosphorus (mg/dL)  Date Value  21/30/8657 3.1   Sodium (mmol/L)  Date Value  05/29/2018 143     Assessment: Patient is a 68yo female that is s/p cardiac arrest. Patient was initiated on hypothermia protocol with pharmacy consult to manage electrolytes.  Patient received KCl IV x 4 this morning for low K of 2.9. Patient is now in rewarming phase.  Goal of Therapy:  Maintain electrolytes within range  Plan:  01/28 15:00 K 4.1, no additional supplementation at this time. Will check BMET at 21:00 and with AM labs.  Clovia Cuff, PharmD, BCPS 05/29/2018 3:39 PM

## 2018-05-30 ENCOUNTER — Inpatient Hospital Stay: Payer: Medicare HMO

## 2018-05-30 LAB — CULTURE, RESPIRATORY W GRAM STAIN

## 2018-05-30 LAB — BLOOD GAS, ARTERIAL
Acid-Base Excess: 4.7 mmol/L — ABNORMAL HIGH (ref 0.0–2.0)
Bicarbonate: 30.9 mmol/L — ABNORMAL HIGH (ref 20.0–28.0)
FIO2: 0.4
MECHVT: 500 mL
O2 SAT: 98.2 %
PEEP: 5 cmH2O
PO2 ART: 109 mmHg — AB (ref 83.0–108.0)
Patient temperature: 37
RATE: 20 resp/min
pCO2 arterial: 51 mmHg — ABNORMAL HIGH (ref 32.0–48.0)
pH, Arterial: 7.39 (ref 7.350–7.450)

## 2018-05-30 LAB — CBC
HCT: 35 % — ABNORMAL LOW (ref 36.0–46.0)
Hemoglobin: 10.6 g/dL — ABNORMAL LOW (ref 12.0–15.0)
MCH: 27 pg (ref 26.0–34.0)
MCHC: 30.3 g/dL (ref 30.0–36.0)
MCV: 89.1 fL (ref 80.0–100.0)
Platelets: 143 10*3/uL — ABNORMAL LOW (ref 150–400)
RBC: 3.93 MIL/uL (ref 3.87–5.11)
RDW: 16.1 % — ABNORMAL HIGH (ref 11.5–15.5)
WBC: 9 10*3/uL (ref 4.0–10.5)
nRBC: 0 % (ref 0.0–0.2)

## 2018-05-30 LAB — GLUCOSE, CAPILLARY
Glucose-Capillary: 126 mg/dL — ABNORMAL HIGH (ref 70–99)
Glucose-Capillary: 133 mg/dL — ABNORMAL HIGH (ref 70–99)
Glucose-Capillary: 138 mg/dL — ABNORMAL HIGH (ref 70–99)
Glucose-Capillary: 153 mg/dL — ABNORMAL HIGH (ref 70–99)
Glucose-Capillary: 155 mg/dL — ABNORMAL HIGH (ref 70–99)
Glucose-Capillary: 214 mg/dL — ABNORMAL HIGH (ref 70–99)

## 2018-05-30 LAB — MAGNESIUM
MAGNESIUM: 2 mg/dL (ref 1.7–2.4)
Magnesium: 1.9 mg/dL (ref 1.7–2.4)

## 2018-05-30 LAB — CULTURE, RESPIRATORY: CULTURE: NORMAL

## 2018-05-30 LAB — BASIC METABOLIC PANEL
Anion gap: 5 (ref 5–15)
BUN: 30 mg/dL — ABNORMAL HIGH (ref 8–23)
CALCIUM: 8.7 mg/dL — AB (ref 8.9–10.3)
CO2: 30 mmol/L (ref 22–32)
Chloride: 108 mmol/L (ref 98–111)
Creatinine, Ser: 0.55 mg/dL (ref 0.44–1.00)
GFR calc Af Amer: >60 mL/min (ref >60)
GFR calc non Af Amer: >60 mL/min (ref >60)
Glucose, Bld: 182 mg/dL — ABNORMAL HIGH (ref 70–99)
Potassium: 3.8 mmol/L (ref 3.5–5.1)
Sodium: 143 mmol/L (ref 135–145)

## 2018-05-30 LAB — PROCALCITONIN: Procalcitonin: 1.41 ng/mL

## 2018-05-30 LAB — TROPONIN I
Troponin I: 0.12 ng/mL (ref <0.03)
Troponin I: 0.14 ng/mL (ref <0.03)
Troponin I: 0.14 ng/mL (ref <0.03)

## 2018-05-30 LAB — PHOSPHORUS: PHOSPHORUS: 3.4 mg/dL (ref 2.5–4.6)

## 2018-05-30 MED ORDER — FENTANYL CITRATE (PF) 100 MCG/2ML IJ SOLN
INTRAMUSCULAR | Status: AC
  Administered 2018-05-30: 50 ug via INTRAVENOUS
  Filled 2018-05-30: qty 2

## 2018-05-30 MED ORDER — FENTANYL CITRATE (PF) 100 MCG/2ML IJ SOLN: 50.0000 ug | INTRAMUSCULAR | Status: DC | PRN

## 2018-05-30 MED ORDER — FENTANYL CITRATE (PF) 100 MCG/2ML IJ SOLN
50.0000 ug | INTRAMUSCULAR | Status: DC | PRN
  Administered 2018-05-30: 50 ug via INTRAVENOUS

## 2018-05-30 NOTE — Progress Notes (Signed)
SUBJECTIVE: Remains intubated and unresponsive.   Vitals:   05/30/18 0715 05/30/18 0730 05/30/18 0745 05/30/18 0800  BP: (!) 108/50 (!) 105/51 (!) 113/43 (!) 111/54  Pulse: 72 73 65 70  Resp: 20 20 (!) 21 18  Temp:    98.8 F (37.1 C)  TempSrc:    Bladder  SpO2: 96% 96% 96% 96%  Weight:      Height:        Intake/Output Summary (Last 24 hours) at 05/30/2018 0909 Last data filed at 05/30/2018 0646 Gross per 24 hour  Intake 2279.6 ml  Output 980 ml  Net 1299.6 ml    LABS: Basic Metabolic Panel: Recent Labs    05/29/18 0431  05/29/18 2037 05/30/18 0003 05/30/18 0325  NA 141   < > 142  --  143  K 2.9*   < > 4.0  --  3.8  CL 109   < > 108  --  108  CO2 28   < > 30  --  30  GLUCOSE 168*   < > 182*  --  182*  BUN 29*   < > 28*  --  30*  CREATININE 0.54   < > 0.46  --  0.55  CALCIUM 8.9   < > 8.5*  --  8.7*  MG 2.1  --   --  1.9 2.0  PHOS 3.1  --   --   --  3.4   < > = values in this interval not displayed.   Liver Function Tests: Recent Labs    05/27/18 2120  AST 229*  ALT 135*  ALKPHOS 155*  BILITOT 0.7  PROT 6.8  ALBUMIN 3.5   No results for input(s): LIPASE, AMYLASE in the last 72 hours. CBC: Recent Labs    05/27/18 2120 05/28/18 1002 05/30/18 0325  WBC 15.8* 13.5* 9.0  NEUTROABS 5.4  --   --   HGB 11.9* 12.0 10.6*  HCT 41.8 38.9 35.0*  MCV 93.1 87.4 89.1  PLT 192 140* 143*   Cardiac Enzymes: Recent Labs    05/28/18 2110 05/30/18 0003 05/30/18 0614  TROPONINI 0.30* 0.14* 0.14*   BNP: Invalid input(s): POCBNP D-Dimer: No results for input(s): DDIMER in the last 72 hours. Hemoglobin A1C: Recent Labs    05/28/18 0615  HGBA1C 7.0*   Fasting Lipid Panel: No results for input(s): CHOL, HDL, LDLCALC, TRIG, CHOLHDL, LDLDIRECT in the last 72 hours. Thyroid Function Tests: No results for input(s): TSH, T4TOTAL, T3FREE, THYROIDAB in the last 72 hours.  Invalid input(s): FREET3 Anemia Panel: No results for input(s): VITAMINB12, FOLATE,  FERRITIN, TIBC, IRON, RETICCTPCT in the last 72 hours.   PHYSICAL EXAM General: Remains intubated and unresponsive. HEENT:  Normocephalic and atramatic Neck:  No JVD.  Lungs: Clear bilaterally to auscultation and percussion. Heart: HRRR . Normal S1 and S2 without gallops or murmurs.  Abdomen: Bowel sounds are positive, abdomen soft and non-tender  Extremities: No clubbing, cyanosis or edema.   Neuro: intubated unresponsive Psych:  Intubated unresponsive  TELEMETRY: NSR 76bpm  ASSESSMENT AND PLAN: Cardiac arrest: likely due to arrhythmia with severe LV dysfunction with left ventricular ejection fraction 20 to 25% and residual ST elevation in the anterolateral leads suggestive of left ventricular aneurysm which may have been the cause of either ventricular fibrillation or ventricular tachycardia. Family updated today. Will do further work-up once patient is extubated and stable.  Principal Problem:   Cardiac arrest [facility]) Active Problems:   CAD (coronary artery disease)  Diabetes mellitus type 2, noninsulin dependent (HCC)   Chronic systolic CHF (congestive heart failure) (HCC)    Caroleen Hamman, NP-C 05/30/2018 9:09 AM Cell: [phone]

## 2018-05-30 NOTE — Consult Note (Signed)
PULMONARY / CRITICAL CARE MEDICINE  Name: Desiree Mccall MRN: [id number] DOB: 06-Aug-1950    Referring Provider: Dr. Jannifer Franklin Reason for Referral: Cardiac arrest Brief patient description: 68 year old female admitted following a witnessed PEA arrest at home.  Now admitted to the ICU for targeted temperature management  CC  Follow up cardiac arrest  HPI Critically ill Prognosis is guarded plan for weaning trails today when family available Full vent support now    REVIEW OF SYSTEMS  PATIENT IS UNABLE TO PROVIDE COMPLETE REVIEW OF SYSTEM S DUE TO SEVERE CRITICAL ILLNESS AND ENCEPHALOPATHY    VITAL SIGNS: BP (!) 105/51   Pulse 73   Temp 98.6 F (37 C)   Resp 20   Ht 5' 8"  (1.727 m)   Wt 107.4 kg   SpO2 96%   BMI 36.00 kg/m   HEMODYNAMICS:    VENTILATOR SETTINGS: Vent Mode: PRVC FiO2 (%):  [35 %-40 %] 40 % Set Rate:  [20 bmp] 20 bmp Vt Set:  [500 mL] 500 mL PEEP:  [5 cmH20] 5 cmH20 Plateau Pressure:  [19 cmH20] 19 cmH20  INTAKE / OUTPUT: I/O last 3 completed shifts: In: 3650.4 [I.V.:1675.6; NG/GT:1480; IV Piggyback:494.8] Out: 2248 [Urine:1515]  PHYSICAL EXAMINATION:  GENERAL:critically ill appearing, +resp distress HEAD: Normocephalic, atraumatic.  EYES: Pupils equal, round, reactive to light.  No scleral icterus.  MOUTH: Moist mucosal membrane. NECK: Supple. No thyromegaly. No nodules. No JVD.  PULMONARY: +rhonchi, +wheezing CARDIOVASCULAR: S1 and S2. Regular rate and rhythm. No murmurs, rubs, or gallops.  GASTROINTESTINAL: Soft, nontender, -distended. No masses. Positive bowel sounds. No hepatosplenomegaly.  MUSCULOSKELETAL: No swelling, clubbing, or edema.  NEUROLOGIC: obtunded SKIN:intact,warm,dry        LABS:  BMET Recent Labs  Lab 05/29/18 1500 05/29/18 2037 05/30/18 0325  NA 143 142 143  K 4.1 4.0 3.8  CL 109 108 108  CO2 31 30 30   BUN 26* 28* 30*  CREATININE 0.55 0.46 0.55  GLUCOSE 150* 182* 182*    Electrolytes Recent Labs   Lab 05/28/18 0351  05/29/18 0431 05/29/18 1500 05/29/18 2037 05/30/18 0003 05/30/18 0325  CALCIUM 8.2*   < > 8.9 8.8* 8.5*  --  8.7*  MG 2.0   < > 2.1  --   --  1.9 2.0  PHOS 4.6  --  3.1  --   --   --  3.4   < > = values in this interval not displayed.    CBC Recent Labs  Lab 05/27/18 2120 05/28/18 1002 05/30/18 0325  WBC 15.8* 13.5* 9.0  HGB 11.9* 12.0 10.6*  HCT 41.8 38.9 35.0*  PLT 192 140* 143*    Coag's Recent Labs  Lab 05/27/18 2120 05/28/18 0351 05/28/18 0959  [name] 2.21 2.23 2.39    Sepsis Markers Recent Labs  Lab 05/27/18 2120 05/28/18 0351 05/28/18 1122 05/29/18 0431 05/30/18 0325  LATICACIDVEN >11.0*  --  1.4  --   --   PROCALCITON  --  2.87  --  2.18 1.41    ABG Recent Labs  Lab 05/28/18 0500 05/29/18 0500 05/30/18 0500  PHART 7.34* 7.35 7.39  PCO2ART 47 49* 51*  PO2ART 88 100 109*    Liver Enzymes Recent Labs  Lab 05/27/18 2120  AST 229*  ALT 135*  ALKPHOS 155*  BILITOT 0.7  ALBUMIN 3.5    Cardiac Enzymes Recent Labs  Lab 05/28/18 2110 05/30/18 0003 05/30/18 0614  TROPONINI 0.30* 0.14* 0.14*    Glucose Recent Labs  Lab 05/29/18 2500  05/29/18 1127 05/29/18 1642 05/29/18 1929 05/29/18 2317 05/30/18 0312  GLUCAP 118* 120* 182* 192* 167* 153*    Imaging Dg Chest Port 1 View  Result Date: 05/30/2018 CLINICAL DATA:  Acute respiratory failure EXAM: PORTABLE CHEST 1 VIEW COMPARISON:  05/28/2018 FINDINGS: Endotracheal tube, gastric catheter and right-sided jugular central line are again seen and stable. Defibrillator is again noted and stable. Stable cardiomegaly and aortic stent graft are seen. Mild central vascular congestion is again identified stable from the prior exam. Haziness is noted in the bases bilaterally suggestive of small effusions. No other focal abnormality is seen. IMPRESSION: Overall stable appearance of the chest when compared with the prior exam. Electronically Signed   By: Inez Catalina M.D.   On:  05/30/2018 07:08    STUDIES:  2D echo pending  CULTURES: Blood cultures x2 Urine culture Sputum culture  ANTIBIOTICS: Given 1 dose of cefepime and vancomycin in the ED Start Zosyn per pharmacy  SIGNIFICANT EVENTS: 05/28/2018: Admitted 1/29 completed hypothermia protocol  LINES/TUBES: ET tube Foley catheter NG tube Triple-lumen catheter A-line  DISCUSSION:   68 year old female presenting with a PEA arrest secondary to acute CHF exacerbation and pulmonary edema with subsequent aspiration into airway severe resp failure     ASSESSMENT / PLAN:   Severe Hypoxic and Hypercapnic Respiratory Failure Cardiac arrest -continue Mechanical Ventilator support -continue Bronchodilator Therapy -Wean Fio2 and PEEP as tolerated -VAP/VENT bundle implementation -will perform SAT/SBT when respiratory parameters are met   CARDIAC FAILURE-Ef 30% Cardiac arrest Completed hypothermia protocol   GI/Nutrition GI PROPHYLAXIS as indicated DIET-->TF's as tolerated Constipation protocol as indicated  ELECTROLYTES -follow labs as needed -replace as needed -pharmacy consultation and following    DVT/GI PRX ordered TRANSFUSIONS AS NEEDED MONITOR FSBS ASSESS the need for LABS as needed    NEUROLOGY Wean sedation-assess neuro status    Critical Care Time devoted to patient care services described in this note is 38 minutes.   Overall, patient is critically ill, prognosis is guarded.  Patient with Multiorgan failure and at high risk for cardiac arrest and death.    Corrin Parker, M.D.  [facility]  Medical Director [place] Director [facility] Cardio-Pulmonary Department

## 2018-05-30 NOTE — Progress Notes (Signed)
PHARMACY CONSULT NOTE - FOLLOW UP  Pharmacy Consult for Electrolyte Monitoring and Replacement   Recent Labs: Potassium (mmol/L)  Date Value  05/30/2018 3.8   Magnesium (mg/dL)  Date Value  82/50/5397 2.0   Calcium (mg/dL)  Date Value  67/34/1937 8.7 (L)   Albumin (g/dL)  Date Value  90/24/0973 3.5   Phosphorus (mg/dL)  Date Value  53/29/9242 3.4   Sodium (mmol/L)  Date Value  05/30/2018 143     Assessment: Patient is a 68yo female that is s/p cardiac arrest. Patient was initiated on hypothermia protocol with pharmacy consult to manage electrolytes.  Patient has now been rewarmed. On Furosemide 20mg  IV daily.  Goal of Therapy:  Maintain electrolytes within range  Plan:  01/29 AM K 3.8, no additional supplementation at this time. Will check BMET with AM labs.  Clovia Cuff, PharmD, BCPS 05/30/2018 11:32 AM

## 2018-05-30 NOTE — Progress Notes (Signed)
Pastoral Care Follow Up Visit    05/30/18 1300  Clinical Encounter Type  Visited With Patient and family together  Visit Type Follow-up  Referral From Chaplain  Consult/Referral To Chaplain  Recommendations continued care by chaplain  Spiritual Encounters  Spiritual Needs Emotional  Stress Factors  Patient Stress Factors Health changes;Family relationships   Visiting family as pt is being removed from sedation.  Fam is anxious but hopeful. Desiree Mccall provided compassionate presence, listened to [employer], and empathized. Continued support and encouragement for family is encouraged.  [place], Chaplain  [employer], [address]

## 2018-05-30 NOTE — Progress Notes (Addendum)
[employer] -  at [facility]   PATIENT NAME: Desiree Mccall    MR#:  [id number]  DATE OF BIRTH:  Aug 22, 1950  SUBJECTIVE:  CHIEF COMPLAINT:   Chief Complaint  Patient presents with  . Cardiac Arrest   Pt is unresponsive, on vent, off sedation. Off levophed drip. REVIEW OF SYSTEMS:  Review of Systems  Unable to perform ROS: Intubated    DRUG ALLERGIES:  [name] VITALS:  Blood pressure (!) 137/52, pulse 73, temperature 98.8 F (37.1 C), temperature source Bladder, resp. rate 18, height 5\' 8"  (1.727 m), weight 107.4 kg, SpO2 93 %. PHYSICAL EXAMINATION:  Physical Exam Constitutional:      Appearance: She is ill-appearing.     Comments: Intubated. Morbid obesity.  HENT:     Head: Normocephalic.  Eyes:     Conjunctiva/sclera: Conjunctivae normal.     Pupils: Pupils are equal, round, and reactive to light.  Cardiovascular:     Rate and Rhythm: Normal rate and regular rhythm.     Heart sounds: No murmur.  Pulmonary:     Breath sounds: Rales present. No wheezing or rhonchi.  Abdominal:     General: There is no distension.     Tenderness: There is no guarding.     Comments: Diminished bowel sound.  Musculoskeletal:     Right lower leg: Edema present.     Left lower leg: Edema present.  Neurological:     Comments: Unable to exam.    LABORATORY PANEL:  Female CBC Recent Labs  Lab 05/30/18 0325  WBC 9.0  HGB 10.6*  HCT 35.0*  PLT 143*   ------------------------------------------------------------------------------------------------------------------ Chemistries  Recent Labs  Lab 05/27/18 2120  05/30/18 0325  NA 142   < > 143  K 4.0   < > 3.8  CL 106   < > 108  CO2 20*   < > 30  GLUCOSE 343*   < > 182*  BUN 18   < > 30*  CREATININE 0.96   < > 0.55  CALCIUM 8.6*   < > 8.7*  MG  --    < > 2.0  AST 229*  --   --   ALT 135*  --   --   ALKPHOS 155*  --   --   BILITOT 0.7  --   --    < > = values in this interval not  displayed.   RADIOLOGY:  Dg Chest Port 1 View  Result Date: 05/30/2018 CLINICAL DATA:  Acute respiratory failure EXAM: PORTABLE CHEST 1 VIEW COMPARISON:  05/28/2018 FINDINGS: Endotracheal tube, gastric catheter and right-sided jugular central line are again seen and stable. Defibrillator is again noted and stable. Stable cardiomegaly and aortic stent graft are seen. Mild central vascular congestion is again identified stable from the prior exam. Haziness is noted in the bases bilaterally suggestive of small effusions. No other focal abnormality is seen. IMPRESSION: Overall stable appearance of the chest when compared with the prior exam. Electronically Signed   By: Alcide Clever M.D.   On: 05/30/2018 07:08   ASSESSMENT AND PLAN:  Desiree Mccall  is a 68 y.o. female with PMH Aortic dissection, CHF, DM and heart attack.   Cardiac arrest [facility]) -this was PEA arrest per EMS report, she received 1 round of epinephrine and CPR and obtained ROSC.  She was intubated here in the ED post arrest.   Cardiogenic shock. Off levophed drip.    Acute on chronic systolic CHF.  Continue iv lasix.  Repeated chest x-ray is stable.  Acute respiratory failure with hypoxia due to above. Continue vent, off sedation.  Follow-up with Dr. Belia Heman for extubation.  Hypokalemia.  Improved with KCl supplement.    CAD (coronary artery disease) -continue home medications, postarrest care as above.    Diabetes mellitus type 2, noninsulin dependent (HCC) -sliding scale insulin coverage  All the records are reviewed and case discussed with Care Management/Social Worker. Management plans discussed with the patient's daughters and they are in agreement.  CODE STATUS: Full Code  TOTAL TIME TAKING CARE OF THIS PATIENT: 35 minutes.   More than 50% of the time was spent in counseling/coordination of care: YES  POSSIBLE D/C IN ? DAYS, DEPENDING ON CLINICAL CONDITION.   Shaune Pollack M.D on 05/30/2018 at 1:48 PM  Between 7am to 6pm  - Pager - [phone]  After 6pm go to [email or web address] - Therapist, nutritional Hospitalists

## 2018-05-30 NOTE — Progress Notes (Signed)
Pt began having an increase in PVC's during the night.  NP made aware and an EKG was done.  Magnesium was checked as well.  NP also made aware of pt's decrease in urine output.  No new orders were given at this time.

## 2018-05-31 ENCOUNTER — Inpatient Hospital Stay: Payer: Medicare HMO

## 2018-05-31 DIAGNOSIS — J9601 Acute respiratory failure with hypoxia: Secondary | ICD-10-CM

## 2018-05-31 LAB — BASIC METABOLIC PANEL
Anion gap: 3 — ABNORMAL LOW (ref 5–15)
BUN: 33 mg/dL — ABNORMAL HIGH (ref 8–23)
CO2: 34 mmol/L — ABNORMAL HIGH (ref 22–32)
Calcium: 8.6 mg/dL — ABNORMAL LOW (ref 8.9–10.3)
Chloride: 107 mmol/L (ref 98–111)
Creatinine, Ser: 0.55 mg/dL (ref 0.44–1.00)
GFR calc Af Amer: >60 mL/min (ref >60)
Glucose, Bld: 183 mg/dL — ABNORMAL HIGH (ref 70–99)
Potassium: 3.8 mmol/L (ref 3.5–5.1)
Sodium: 144 mmol/L (ref 135–145)

## 2018-05-31 LAB — PHOSPHORUS: Phosphorus: 4.1 mg/dL (ref 2.5–4.6)

## 2018-05-31 LAB — GLUCOSE, CAPILLARY
GLUCOSE-CAPILLARY: 184 mg/dL — AB (ref 70–99)
Glucose-Capillary: 156 mg/dL — ABNORMAL HIGH (ref 70–99)
Glucose-Capillary: 223 mg/dL — ABNORMAL HIGH (ref 70–99)
Glucose-Capillary: 213 mg/dL — ABNORMAL HIGH (ref 70–99)
Glucose-Capillary: 224 mg/dL — ABNORMAL HIGH (ref 70–99)

## 2018-05-31 LAB — CBC
HCT: 33.4 % — ABNORMAL LOW (ref 36.0–46.0)
Hemoglobin: 10 g/dL — ABNORMAL LOW (ref 12.0–15.0)
MCH: 26.8 pg (ref 26.0–34.0)
MCHC: 29.9 g/dL — ABNORMAL LOW (ref 30.0–36.0)
MCV: 89.5 fL (ref 80.0–100.0)
Platelets: 145 10*3/uL — ABNORMAL LOW (ref 150–400)
RBC: 3.73 MIL/uL — ABNORMAL LOW (ref 3.87–5.11)
RDW: 16.4 % — ABNORMAL HIGH (ref 11.5–15.5)
WBC: 7.5 10*3/uL (ref 4.0–10.5)
nRBC: 0 % (ref 0.0–0.2)

## 2018-05-31 LAB — MAGNESIUM: MAGNESIUM: 2 mg/dL (ref 1.7–2.4)

## 2018-05-31 MED ORDER — IPRATROPIUM-ALBUTEROL 0.5-2.5 (3) MG/3ML IN SOLN
3.0000 mL | RESPIRATORY_TRACT | Status: DC
  Administered 2018-05-31 – 2018-06-03 (×14): 3 mL via RESPIRATORY_TRACT
  Filled 2018-05-31 (×17): qty 3

## 2018-05-31 MED ORDER — FENTANYL CITRATE (PF) 100 MCG/2ML IJ SOLN
100.0000 ug | INTRAMUSCULAR | Status: AC
  Administered 2018-05-31: 100 ug via INTRAVENOUS

## 2018-05-31 MED ORDER — LORAZEPAM 2 MG/ML IJ SOLN
INTRAMUSCULAR | Status: AC
  Filled 2018-05-31: qty 2

## 2018-05-31 MED ORDER — FENTANYL 2500MCG IN NS 250ML (10MCG/ML) PREMIX INFUSION
200.0000 ug/h | INTRAVENOUS | Status: DC
  Administered 2018-05-31: 200 ug/h via INTRAVENOUS
  Administered 2018-06-01: 165 ug/h via INTRAVENOUS
  Filled 2018-05-31: qty 250

## 2018-05-31 MED ORDER — LORAZEPAM 2 MG/ML IJ SOLN
4.0000 mg | Freq: Once | INTRAMUSCULAR | Status: AC
  Administered 2018-05-31: 4 mg via INTRAVENOUS

## 2018-05-31 MED ORDER — METHYLPREDNISOLONE SODIUM SUCC 40 MG IJ SOLR
40.0000 mg | Freq: Two times a day (BID) | INTRAMUSCULAR | Status: DC
  Administered 2018-05-31 – 2018-06-03 (×6): 40 mg via INTRAVENOUS
  Filled 2018-05-31 (×6): qty 1

## 2018-05-31 MED ORDER — FENTANYL BOLUS VIA INFUSION
25.0000 ug | INTRAVENOUS | Status: DC | PRN
  Administered 2018-05-31: 100 ug via INTRAVENOUS
  Filled 2018-05-31: qty 25

## 2018-05-31 MED ORDER — BUDESONIDE 0.5 MG/2ML IN SUSP
0.5000 mg | Freq: Two times a day (BID) | RESPIRATORY_TRACT | Status: DC
  Administered 2018-05-31 – 2018-06-05 (×10): 0.5 mg via RESPIRATORY_TRACT
  Filled 2018-05-31 (×11): qty 2

## 2018-05-31 MED ORDER — FENTANYL CITRATE (PF) 100 MCG/2ML IJ SOLN: 50.0000 ug | Freq: Once | INTRAMUSCULAR | Status: AC

## 2018-05-31 MED ORDER — LORAZEPAM 2 MG/ML IJ SOLN
4.0000 mg | INTRAMUSCULAR | Status: AC
  Administered 2018-05-31: 4 mg via INTRAVENOUS

## 2018-05-31 MED ORDER — FAMOTIDINE 20 MG PO TABS
20.0000 mg | ORAL_TABLET | Freq: Two times a day (BID) | ORAL | Status: DC
  Administered 2018-05-31 – 2018-06-01 (×2): 20 mg
  Filled 2018-05-31 (×3): qty 1

## 2018-05-31 MED ORDER — FENTANYL 2500MCG IN NS 250ML (10MCG/ML) PREMIX INFUSION
INTRAVENOUS | Status: AC
  Administered 2018-05-31: 200 ug/h via INTRAVENOUS
  Filled 2018-05-31: qty 250

## 2018-05-31 MED ORDER — FENTANYL 2500MCG IN NS 250ML (10MCG/ML) PREMIX INFUSION: 25.0000 ug/h | INTRAVENOUS | Status: DC

## 2018-05-31 NOTE — Progress Notes (Signed)
PHARMACIST - PHYSICIAN COMMUNICATION  CONCERNING: IV to Oral Route Change Policy  RECOMMENDATION: This patient is receiving famotidine by the intravenous route.  Based on criteria approved by the Pharmacy and Therapeutics Committee, the intravenous medication(s) is/are being converted to the equivalent oral dose form(s).   DESCRIPTION: These criteria include:  The patient is eating (either orally or via tube) and/or has been taking other orally administered medications for a least 24 hours  The patient has no evidence of active gastrointestinal bleeding or impaired GI absorption (gastrectomy, short bowel, patient on TNA or NPO).  If you have questions about this conversion, please contact the Pharmacy Department   []   [phone] )  [facility]   Simpson,Michael L, [facility] 05/31/2018 10:28 AM

## 2018-05-31 NOTE — Progress Notes (Signed)
Patient failed SAT/SBT due to increased WOB and wheezing-family witnessed event.  Lots of thick secretions  Start IV steroids BD therapy Check sputum cultures Continue vent support SAT/SBT in next 24 hrs     Desiree Mccall Santiago Glad, M.D.  [facility]  Medical Director [facility] Medical Director [facility] Cardio-Pulmonary Department

## 2018-05-31 NOTE — Progress Notes (Signed)
[employer] - [facility] at [facility]   PATIENT NAME: Desiree Mccall    MR#:  [id number]  DATE OF BIRTH:  09/25/1950  SUBJECTIVE:  CHIEF COMPLAINT:   Chief Complaint  Patient presents with  . Cardiac Arrest   Pt opened eyes upon verbal stimuli, on vent, off sedation. REVIEW OF SYSTEMS:  Review of Systems  Unable to perform ROS: Intubated    DRUG ALLERGIES:  [name] VITALS:  Blood pressure (!) 141/63, pulse 82, temperature 99.5 F (37.5 C), resp. rate (!) 23, height 5\' 8"  (1.727 m), weight 105.5 kg, SpO2 93 %. PHYSICAL EXAMINATION:  Physical Exam Constitutional:      Appearance: She is ill-appearing.     Comments: Intubated. Morbid obesity.  HENT:     Head: Normocephalic.  Eyes:     Conjunctiva/sclera: Conjunctivae normal.     Pupils: Pupils are equal, round, and reactive to light.  Cardiovascular:     Rate and Rhythm: Normal rate and regular rhythm.     Heart sounds: No murmur.  Pulmonary:     Breath sounds: No wheezing, rhonchi or rales.  Abdominal:     General: There is no distension.     Tenderness: There is no guarding.     Comments: Diminished bowel sound.  Musculoskeletal:     Right lower leg: Edema present.     Left lower leg: Edema present.  Neurological:     Comments: Unable to exam.    LABORATORY PANEL:  Female CBC Recent Labs  Lab 05/31/18 0534  WBC 7.5  HGB 10.0*  HCT 33.4*  PLT 145*   ------------------------------------------------------------------------------------------------------------------ Chemistries  Recent Labs  Lab 05/27/18 2120  05/31/18 0534  NA 142   < > 144  K 4.0   < > 3.8  CL 106   < > 107  CO2 20*   < > 34*  GLUCOSE 343*   < > 183*  BUN 18   < > 33*  CREATININE 0.96   < > 0.55  CALCIUM 8.6*   < > 8.6*  MG  --    < > 2.0  AST 229*  --   --   ALT 135*  --   --   ALKPHOS 155*  --   --   BILITOT 0.7  --   --    < > = values in this interval not displayed.   RADIOLOGY:  Dg Chest Port  1 View  Result Date: 05/31/2018 CLINICAL DATA:  Acute respiratory failure EXAM: PORTABLE CHEST 1 VIEW COMPARISON:  Yesterday FINDINGS: Cardiomegaly with ICD/pacer leads in stable position. Thoracic aortic stent graft. Right IJ line with tip at the SVC. Endotracheal tube with tip between the clavicular heads and carina. The orogastric tube reaches the stomach. Improved aeration and diaphragm visualization on the right. There is still hazy density at the left base at least partially from pleural fluid. No Kerley lines or pneumothorax. IMPRESSION: 1. Stable, unremarkable hardware positioning. 2. Continued retrocardiac opacification from pleural fluid and atelectasis based on admission chest CT. Electronically Signed   By: Marnee SpringJonathon  Watts M.D.   On: 05/31/2018 06:35   ASSESSMENT AND PLAN:  Desiree Mccall  is a 68 y.o. female with PMH Aortic dissection, CHF, DM and heart attack.   Cardiac arrest [facility](HCC) -this was PEA arrest per EMS report, she received 1 round of epinephrine and CPR and obtained ROSC.  She was intubated here in the ED post arrest.   Cardiogenic shock. Off  levophed drip.    Acute on chronic systolic CHF. Continue iv lasix.  Repeated chest x-ray is stable.  Acute respiratory failure with hypoxia due to above. Try to wean off ventilation, off sedation.  Follow-up with Dr. Belia Heman for extubation.  Hypokalemia.  Improved with KCl supplement.    CAD (coronary artery disease) -continue home medications, postarrest care as above.    Diabetes mellitus type 2, noninsulin dependent (HCC) -sliding scale insulin coverage  I discussed with Dr. Belia Heman. All the records are reviewed and case discussed with Care Management/Social Worker. Management plans discussed with the patient's son and they are in agreement.  CODE STATUS: Full Code  TOTAL TIME TAKING CARE OF THIS PATIENT: 32 minutes.   More than 50% of the time was spent in counseling/coordination of care: YES  POSSIBLE D/C IN ? DAYS,  DEPENDING ON CLINICAL CONDITION.   Shaune Pollack M.D on 05/31/2018 at 1:23 PM  Between 7am to 6pm - Pager - [phone]  After 6pm go to [email or web address] - Therapist, nutritional Hospitalists

## 2018-05-31 NOTE — Progress Notes (Signed)
Pastoral Care Follow Up Visit   05/31/18 1000  Clinical Encounter Type  Visited With Patient and family together;Health care provider  Visit Type Follow-up;Spiritual support;Critical Care  Referral From Chaplain  Consult/Referral To Chaplain  Spiritual Encounters  Spiritual Needs Emotional;Prayer  Stress Factors  Family Stress Factors Health changes   Pt's family is present and supportive.  They seem to value chap presence and sharing stories abt pt and family.  They are hopeful and engaged in pt progress and care.  Milinda Antis, [address]

## 2018-05-31 NOTE — Consult Note (Signed)
PULMONARY / CRITICAL CARE MEDICINE  Name: Claudean R Figueira MRN: [id number] DOB: 08/11/1950    Referring Provider: Dr. Willis Reason for Referral: Cardiac arrest Brief patient description: 68-year-old female admitted following a witnessed PEA arrest at home.  Now admitted to the ICU for targeted temperature management  CC  Cardiac arrest  HPI Critically ill Prognosis is guarded Completed hypothermia protocol On full vent support    REVIEW OF SYSTEMS  PATIENT IS UNABLE TO PROVIDE COMPLETE REVIEW OF SYSTEM S DUE TO SEVERE CRITICAL ILLNESS AND ENCEPHALOPATHY    VITAL SIGNS: BP (!) 130/53   Pulse 67   Temp 99.9 F (37.7 C)   Resp 20   Ht 5' 8" (1.727 m)   Wt 105.5 kg   SpO2 93%   BMI 35.36 kg/m   HEMODYNAMICS:    VENTILATOR SETTINGS: Vent Mode: PRVC FiO2 (%):  [40 %] 40 % Set Rate:  [20 bmp] 20 bmp Vt Set:  [500 mL] 500 mL PEEP:  [2 cmH20-5 cmH20] 2 cmH20 Plateau Pressure:  [20 cmH20] 20 cmH20  INTAKE / OUTPUT: I/O last 3 completed shifts: In: 2343.6 [I.V.:794.2; NG/GT:1400; IV Piggyback:149.4] Out: 1855 [Urine:1855]  PHYSICAL EXAMINATION:  GENERAL:critically ill appearing, +resp distress HEAD: Normocephalic, atraumatic.  EYES: Pupils equal, round, reactive to light.  No scleral icterus.  MOUTH: Moist mucosal membrane. NECK: Supple. No thyromegaly. No nodules. No JVD.  PULMONARY: +rhonchi, +wheezing CARDIOVASCULAR: S1 and S2. Regular rate and rhythm. No murmurs, rubs, or gallops.  GASTROINTESTINAL: Soft, nontender, -distended. No masses. Positive bowel sounds. No hepatosplenomegaly.  MUSCULOSKELETAL: No swelling, clubbing, or edema.  NEUROLOGIC: obtunded SKIN:intact,warm,dry  LABS:  BMET Recent Labs  Lab 05/29/18 2037 05/30/18 0325 05/31/18 0534  NA 142 143 144  K 4.0 3.8 3.8  CL 108 108 107  CO2 30 30 34*  BUN 28* 30* 33*  CREATININE 0.46 0.55 0.55  GLUCOSE 182* 182* 183*    Electrolytes Recent Labs  Lab 05/29/18 0431  05/29/18 2037  05/30/18 0003 05/30/18 0325 05/31/18 0534  CALCIUM 8.9   < > 8.5*  --  8.7* 8.6*  MG 2.1  --   --  1.9 2.0 2.0  PHOS 3.1  --   --   --  3.4 4.1   < > = values in this interval not displayed.    CBC Recent Labs  Lab 05/28/18 1002 05/30/18 0325 05/31/18 0534  WBC 13.5* 9.0 7.5  HGB 12.0 10.6* 10.0*  HCT 38.9 35.0* 33.4*  PLT 140* 143* 145*    Coag's Recent Labs  Lab 05/27/18 2120 05/28/18 0351 05/28/18 0959  [name] 2.21 2.23 2.39    Sepsis Markers Recent Labs  Lab 05/27/18 2120 05/28/18 0351 05/28/18 1122 05/29/18 0431 05/30/18 0325  LATICACIDVEN >11.0*  --  1.4  --   --   PROCALCITON  --  2.87  --  2.18 1.41    ABG Recent Labs  Lab 05/28/18 0500 05/29/18 0500 05/30/18 0500  PHART 7.34* 7.35 7.39  PCO2ART 47 49* 51*  PO2ART 88 100 109*    Liver Enzymes Recent Labs  Lab 05/27/18 2120  AST 229*  ALT 135*  ALKPHOS 155*  BILITOT 0.7  ALBUMIN 3.5    Cardiac Enzymes Recent Labs  Lab 05/30/18 0003 05/30/18 0614 05/30/18 1238  TROPONINI 0.14* 0.14* 0.12*    Glucose Recent Labs  Lab 05/30/18 1233 05/30/18 1616 05/30/18 1947 05/30/18 2348 05/31/18 0323 05/31/18 0717  GLUCAP 126* 155* 133* 214* 184* 156*    Imaging Dg   Chest Port 1 View  Result Date: 05/31/2018 CLINICAL DATA:  Acute respiratory failure EXAM: PORTABLE CHEST 1 VIEW COMPARISON:  Yesterday FINDINGS: Cardiomegaly with ICD/pacer leads in stable position. Thoracic aortic stent graft. Right IJ line with tip at the SVC. Endotracheal tube with tip between the clavicular heads and carina. The orogastric tube reaches the stomach. Improved aeration and diaphragm visualization on the right. There is still hazy density at the left base at least partially from pleural fluid. No Kerley lines or pneumothorax. IMPRESSION: 1. Stable, unremarkable hardware positioning. 2. Continued retrocardiac opacification from pleural fluid and atelectasis based on admission chest CT. Electronically Signed   By:  Jonathon  Watts M.D.   On: 05/31/2018 06:35    STUDIES:  2D echo pending  CULTURES: Blood cultures x2 Urine culture Sputum culture  ANTIBIOTICS: Given 1 dose of cefepime and vancomycin in the ED Start Zosyn per pharmacy  SIGNIFICANT EVENTS: 05/28/2018: Admitted 1/29 completed hypothermia protocol 1/30 remains on vent  LINES/TUBES: ET tube Foley catheter NG tube Triple-lumen catheter A-line  DISCUSSION:   68-year-old female presenting with a PEA arrest secondary to acute CHF exacerbation and pulmonary edema with subsequent aspiration into airway severe resp failure s/p hypothermia protocol     ASSESSMENT / PLAN:  Severe Hypoxic and Hypercapnic Respiratory Failure -continue Mechanical Ventilator support -continue Bronchodilator Therapy -Wean Fio2 and PEEP as tolerated -VAP/VENT bundle implementation -will perform SAT/SBT when respiratory parameters are met    CARDIAC FAILURE-EF 30% Completed hypothermia protocol Follow up cardiology recs    GI/Nutrition GI PROPHYLAXIS as indicated DIET-->TF's as tolerated Constipation protocol as indicated  ELECTROLYTES -follow labs as needed -replace as needed -pharmacy consultation and following     DVT/GI PRX ordered TRANSFUSIONS AS NEEDED MONITOR FSBS ASSESS the need for LABS as needed     NEUROLOGY - intubated Avoid sedatives assess NEURO STATUS Consider CT head/MRI head in next 24 hrs if not improving   Critical Care Time devoted to patient care services described in this note is 40 minutes.   Overall, patient is critically ill, prognosis is guarded.  Patient with Multiorgan failure and at high risk for cardiac arrest and death.    Kurian David Kasa, M.D.  [place] [facility]  Medical Director ICU-[facility] [place] Medical Director [facility] Cardio-Pulmonary Department             

## 2018-05-31 NOTE — Progress Notes (Signed)
SUBJECTIVE: Remains intubated and sedated.   Vitals:   05/31/18 0400 05/31/18 0500 05/31/18 0600 05/31/18 0700  BP: (!) 114/48 (!) 117/51 (!) 127/51 (!) 130/53  Pulse: 73 76 72 67  Resp: 20 20 20 20   Temp: 99.9 F (37.7 C) 99.9 F (37.7 C) 99.9 F (37.7 C) 99.9 F (37.7 C)  TempSrc:      SpO2: 93% 93% 93% 93%  Weight:  105.5 kg    Height:        Intake/Output Summary (Last 24 hours) at 05/31/2018 0830 Last data filed at 05/31/2018 0100 Gross per 24 hour  Intake 906.47 ml  Output 1575 ml  Net -668.53 ml    LABS: Basic Metabolic Panel: Recent Labs    05/30/18 0325 05/31/18 0534  NA 143 144  K 3.8 3.8  CL 108 107  CO2 30 34*  GLUCOSE 182* 183*  BUN 30* 33*  CREATININE 0.55 0.55  CALCIUM 8.7* 8.6*  MG 2.0 2.0  PHOS 3.4 4.1   Liver Function Tests: No results for input(s): AST, ALT, ALKPHOS, BILITOT, PROT, ALBUMIN in the last 72 hours. No results for input(s): LIPASE, AMYLASE in the last 72 hours. CBC: Recent Labs    05/30/18 0325 05/31/18 0534  WBC 9.0 7.5  HGB 10.6* 10.0*  HCT 35.0* 33.4*  MCV 89.1 89.5  PLT 143* 145*   Cardiac Enzymes: Recent Labs    05/30/18 0003 05/30/18 0614 05/30/18 1238  TROPONINI 0.14* 0.14* 0.12*   BNP: Invalid input(s): POCBNP D-Dimer: No results for input(s): DDIMER in the last 72 hours. Hemoglobin A1C: No results for input(s): HGBA1C in the last 72 hours. Fasting Lipid Panel: No results for input(s): CHOL, HDL, LDLCALC, TRIG, CHOLHDL, LDLDIRECT in the last 72 hours. Thyroid Function Tests: No results for input(s): TSH, T4TOTAL, T3FREE, THYROIDAB in the last 72 hours.  Invalid input(s): FREET3 Anemia Panel: No results for input(s): VITAMINB12, FOLATE, FERRITIN, TIBC, IRON, RETICCTPCT in the last 72 hours.   PHYSICAL EXAM General: Remains intubated and unresponsive. HEENT:  Normocephalic and atramatic Neck:   No JVD.  Lungs: Clear bilaterally to auscultation and percussion. Heart: HRRR . Normal S1 and S2  without gallops or murmurs.  Abdomen: Bowel sounds are positive, abdomen soft and non-tender  Extremities: No clubbing, cyanosis or edema.   Neuro: intubated unresponsive Psych:  Intubated unresponsive  TELEMETRY: NSR 76bpm  ASSESSMENT AND PLAN: Status post cardiac arrest, PEA with echo showing severe LV dysfunction 20-25% with diffuse akinesis of myocardium. Hypothermia protocol completed. Troponin has been mildly elevated. Advise continued respiratory support, wean as tolerated. Will add CHF medications and further CAD work-up when extubated and stable.    Principal Problem:   Cardiac arrest [facility]) Active Problems:   CAD (coronary artery disease)   Diabetes mellitus type 2, noninsulin dependent (HCC)   Chronic systolic CHF (congestive heart failure) (HCC)    Desiree Hamman, NP-C 05/31/2018 8:30 AM Cell: [phone]

## 2018-05-31 NOTE — Progress Notes (Signed)
[id number] Patient wide awake after bath. Attempted SBT. Patient became extremely agitated and attempted to get up. Family present for event. Heart rate in 140s and respiratory rate 30s. Patient was also diaphoretic. Dr. Belia Heman in. Orders received. Patient given 4 mg's of Lorazepam and 100 mcg of Fentanyl and started on a Fentanyl drip for sedation. Patient resting quietly at 1415. Family in and out. Episode explained to family per DR. Kasa.

## 2018-05-31 NOTE — Progress Notes (Signed)
Late note. SBT failed due to no breathing effort on patients part due to drowsiness

## 2018-06-01 ENCOUNTER — Inpatient Hospital Stay: Payer: Medicare HMO

## 2018-06-01 LAB — GLUCOSE, CAPILLARY
GLUCOSE-CAPILLARY: 149 mg/dL — AB (ref 70–99)
Glucose-Capillary: 278 mg/dL — ABNORMAL HIGH (ref 70–99)
Glucose-Capillary: 285 mg/dL — ABNORMAL HIGH (ref 70–99)
Glucose-Capillary: 296 mg/dL — ABNORMAL HIGH (ref 70–99)
Glucose-Capillary: 329 mg/dL — ABNORMAL HIGH (ref 70–99)

## 2018-06-01 LAB — BLOOD GAS, ARTERIAL
ALLENS TEST (PASS/FAIL): POSITIVE — AB
Acid-Base Excess: 9.9 mmol/L — ABNORMAL HIGH (ref 0.0–2.0)
Bicarbonate: 36 mmol/L — ABNORMAL HIGH (ref 20.0–28.0)
FIO2: 30
O2 Saturation: 91.5 %
PEEP/CPAP: 5 cmH2O
Patient temperature: 37
Pressure support: 5 cmH2O
pCO2 arterial: 53 mmHg — ABNORMAL HIGH (ref 32.0–48.0)
pH, Arterial: 7.44 (ref 7.350–7.450)
pO2, Arterial: 60 mmHg — ABNORMAL LOW (ref 83.0–108.0)

## 2018-06-01 LAB — BASIC METABOLIC PANEL
Anion gap: 7 (ref 5–15)
BUN: 35 mg/dL — ABNORMAL HIGH (ref 8–23)
CO2: 33 mmol/L — AB (ref 22–32)
Calcium: 8.8 mg/dL — ABNORMAL LOW (ref 8.9–10.3)
Chloride: 103 mmol/L (ref 98–111)
Creatinine, Ser: 0.55 mg/dL (ref 0.44–1.00)
GFR calc non Af Amer: >60 mL/min (ref >60)
Glucose, Bld: 308 mg/dL — ABNORMAL HIGH (ref 70–99)
Potassium: 3.9 mmol/L (ref 3.5–5.1)
Sodium: 143 mmol/L (ref 135–145)

## 2018-06-01 LAB — CBC
HCT: 33.2 % — ABNORMAL LOW (ref 36.0–46.0)
Hemoglobin: 10.2 g/dL — ABNORMAL LOW (ref 12.0–15.0)
MCH: 27 pg (ref 26.0–34.0)
MCHC: 30.7 g/dL (ref 30.0–36.0)
MCV: 87.8 fL (ref 80.0–100.0)
NRBC: 0 % (ref 0.0–0.2)
Platelets: 169 10*3/uL (ref 150–400)
RBC: 3.78 MIL/uL — ABNORMAL LOW (ref 3.87–5.11)
RDW: 15.9 % — ABNORMAL HIGH (ref 11.5–15.5)
WBC: 8.5 10*3/uL (ref 4.0–10.5)

## 2018-06-01 LAB — CULTURE, BLOOD (ROUTINE X 2)
CULTURE: NO GROWTH
Culture: NO GROWTH
Special Requests: ADEQUATE
Special Requests: ADEQUATE

## 2018-06-01 MED ORDER — PANTOPRAZOLE SODIUM 40 MG PO PACK
40.0000 mg | PACK | Freq: Every day | ORAL | Status: DC
  Filled 2018-06-01: qty 20

## 2018-06-01 MED ORDER — DEXMEDETOMIDINE HCL IN NACL 400 MCG/100ML IV SOLN
0.4000 ug/kg/h | INTRAVENOUS | Status: DC
  Administered 2018-06-01: 1 ug/kg/h via INTRAVENOUS
  Administered 2018-06-01: 0.4 ug/kg/h via INTRAVENOUS
  Filled 2018-06-01 (×2): qty 100

## 2018-06-01 MED ORDER — ATORVASTATIN CALCIUM 20 MG PO TABS: 40.0000 mg | ORAL_TABLET | Freq: Every day | ORAL | Status: DC

## 2018-06-01 MED ORDER — FLUOXETINE HCL 20 MG/5ML PO SOLN
40.0000 mg | Freq: Every day | ORAL | Status: DC
  Administered 2018-06-01 – 2018-06-04 (×4): 40 mg
  Filled 2018-06-01 (×8): qty 10

## 2018-06-01 MED ORDER — MENTHOL 3 MG MT LOZG
1.0000 | LOZENGE | OROMUCOSAL | Status: DC | PRN
  Filled 2018-06-01: qty 9

## 2018-06-01 MED ORDER — INSULIN GLARGINE 100 UNIT/ML ~~LOC~~ SOLN
5.0000 [IU] | Freq: Every day | SUBCUTANEOUS | Status: DC
  Administered 2018-06-01 – 2018-06-05 (×5): 5 [IU] via SUBCUTANEOUS
  Filled 2018-06-01 (×7): qty 0.05

## 2018-06-01 MED ORDER — HYDRALAZINE HCL 20 MG/ML IJ SOLN
10.0000 mg | INTRAMUSCULAR | Status: DC | PRN
  Administered 2018-06-02 (×2): 10 mg via INTRAVENOUS
  Filled 2018-06-01 (×2): qty 1

## 2018-06-01 MED ORDER — ATORVASTATIN CALCIUM 20 MG PO TABS
40.0000 mg | ORAL_TABLET | Freq: Every day | ORAL | Status: DC
  Administered 2018-06-02 – 2018-06-05 (×4): 40 mg via ORAL
  Filled 2018-06-01 (×5): qty 2

## 2018-06-01 NOTE — Progress Notes (Signed)
Pastoral Care Visit    06/01/18 0945  Clinical Encounter Type  Visited With Patient and family together  Visit Type Follow-up  Referral From Chaplain  Consult/Referral To Chaplain  Spiritual Encounters  Spiritual Needs Emotional  Stress Factors  Family Stress Factors None identified   Pt was not responsive for visit.  Chap visited with daughters and niece.  Fam is hopeful and optimistic for extubation today. While visiting fam was talking with pt, pt was responding, pt was also moving and attempting to reach up to mouth tubes.  Fam is present, concerned, and supportive.  Milinda Antis, [address]

## 2018-06-01 NOTE — Progress Notes (Signed)
precedex for pt am. Pt extubated around 12 pm tolerated well. Pt drowsy for little bit. Once more awake, pt was finally given little by little her prozac, tolerated well, also given ice chips one by one and also tolerated well .ao*4 with dry voice. Follow commands and moving extremities. A lot of pt and family interaction brightening up mood of family and pt. Pt and family very calm and cooperative.

## 2018-06-01 NOTE — Progress Notes (Signed)
SUBJECTIVE: Intubated, current on fentanyl drip due to agitation. Responding to visitors, recognizing faces.    Vitals:   06/01/18 0500 06/01/18 0530 06/01/18 0600 06/01/18 0630  BP: (!) 121/49  (!) 133/54   Pulse: 72 71 72 72  Resp: 20 20 20 20   Temp: 98.6 F (37 C) 98.6 F (37 C) 98.6 F (37 C) 98.6 F (37 C)  TempSrc:      SpO2: 95% 96% 97% 96%  Weight:      Height:        Intake/Output Summary (Last 24 hours) at 06/01/2018 0852 Last data filed at 06/01/2018 0600 Gross per 24 hour  Intake 1408.64 ml  Output 1700 ml  Net -291.36 ml    LABS: Basic Metabolic Panel: Recent Labs    05/30/18 0325 05/31/18 0534 06/01/18 0413  NA 143 144 143  K 3.8 3.8 3.9  CL 108 107 103  CO2 30 34* 33*  GLUCOSE 182* 183* 308*  BUN 30* 33* 35*  CREATININE 0.55 0.55 0.55  CALCIUM 8.7* 8.6* 8.8*  MG 2.0 2.0  --   PHOS 3.4 4.1  --    Liver Function Tests: No results for input(s): AST, ALT, ALKPHOS, BILITOT, PROT, ALBUMIN in the last 72 hours. No results for input(s): LIPASE, AMYLASE in the last 72 hours. CBC: Recent Labs    05/31/18 0534 06/01/18 0413  WBC 7.5 8.5  HGB 10.0* 10.2*  HCT 33.4* 33.2*  MCV 89.5 87.8  PLT 145* 169   Cardiac Enzymes: Recent Labs    05/30/18 0003 05/30/18 0614 05/30/18 1238  TROPONINI 0.14* 0.14* 0.12*   BNP: Invalid input(s): POCBNP D-Dimer: No results for input(s): DDIMER in the last 72 hours. Hemoglobin A1C: No results for input(s): HGBA1C in the last 72 hours. Fasting Lipid Panel: No results for input(s): CHOL, HDL, LDLCALC, TRIG, CHOLHDL, LDLDIRECT in the last 72 hours. Thyroid Function Tests: No results for input(s): TSH, T4TOTAL, T3FREE, THYROIDAB in the last 72 hours.  Invalid input(s): FREET3 Anemia Panel: No results for input(s): VITAMINB12, FOLATE, FERRITIN, TIBC, IRON, RETICCTPCT in the last 72 hours.   PHYSICAL EXAM General:Remains intubated, on fentanyl drip. HEENT:Normocephalic and atramatic Neck: No JVD.   Lungs: Clear bilaterally to auscultation and percussion. Heart:HRRR . Normal S1 and S2 without gallops or murmurs.  Abdomen: Bowel sounds are positive, abdomen soft and non-tender  Extremities:No clubbing, cyanosis or edema.  Neuro:intubated Psych:Intubated  TELEMETRY: NSR 76bpm  ASSESSMENT AND PLAN: Status post cardiac arrest, PEA with echo showing severe LV dysfunction 20-25% with diffuse akinesis of myocardium. Hypothermia protocol completed. Troponin has been mildly elevated. Advise continued respiratory support, wean as tolerated. Will add CHF medications and further CAD work-up when extubated and stable.   Principal Problem:   Cardiac arrest [facility]) Active Problems:   CAD (coronary artery disease)   Diabetes mellitus type 2, noninsulin dependent (HCC)   Chronic systolic CHF (congestive heart failure) (HCC)    Caroleen Hamman, NP-C 06/01/2018 8:52 AM Cell: [phone]

## 2018-06-01 NOTE — Progress Notes (Addendum)
[employer] - [place] at [facility]   PATIENT NAME: Desiree Mccall    MR#:  [id number]  DATE OF BIRTH:  24-Apr-1951  SUBJECTIVE:  CHIEF COMPLAINT:   Chief Complaint  Patient presents with  . Cardiac Arrest   Pt opened eyes upon verbal stimuli, on vent. She was agitated last night and put on Precedex. REVIEW OF SYSTEMS:  Review of Systems  Unable to perform ROS: Intubated    DRUG ALLERGIES:  [name] VITALS:  Blood pressure (!) 133/107, pulse 91, temperature 100 F (37.8 C), resp. rate 14, height 5\' 8"  (1.727 m), weight 105.4 kg, SpO2 94 %. PHYSICAL EXAMINATION:  Physical Exam Constitutional:      Appearance: She is ill-appearing.     Comments: Intubated. Morbid obesity.  HENT:     Head: Normocephalic.  Eyes:     Conjunctiva/sclera: Conjunctivae normal.     Pupils: Pupils are equal, round, and reactive to light.  Cardiovascular:     Rate and Rhythm: Normal rate and regular rhythm.     Heart sounds: No murmur.  Pulmonary:     Breath sounds: No wheezing, rhonchi or rales.  Abdominal:     General: There is no distension.     Tenderness: There is no guarding.     Comments: Diminished bowel sound.  Musculoskeletal:     Right lower leg: Edema present.     Left lower leg: Edema present.  Neurological:     Comments: Unable to exam.    LABORATORY PANEL:  Female CBC Recent Labs  Lab 06/01/18 0413  WBC 8.5  HGB 10.2*  HCT 33.2*  PLT 169   ------------------------------------------------------------------------------------------------------------------ Chemistries  Recent Labs  Lab 05/27/18 2120  05/31/18 0534 06/01/18 0413  NA 142   < > 144 143  K 4.0   < > 3.8 3.9  CL 106   < > 107 103  CO2 20*   < > 34* 33*  GLUCOSE 343*   < > 183* 308*  BUN 18   < > 33* 35*  CREATININE 0.96   < > 0.55 0.55  CALCIUM 8.6*   < > 8.6* 8.8*  MG  --    < > 2.0  --   AST 229*  --   --   --   ALT 135*  --   --   --   ALKPHOS 155*  --   --   --    BILITOT 0.7  --   --   --    < > = values in this interval not displayed.   RADIOLOGY:  Dg Chest Port 1 View  Result Date: 06/01/2018 CLINICAL DATA:  Respiratory failure. EXAM: PORTABLE CHEST 1 VIEW COMPARISON:  05/31/2018. FINDINGS: Endotracheal tube, NG tube, right IJ line in stable position. AICD in stable position. Aortic stent graft and coils in stable position. Stable cardiomegaly. Increased interstitial markings and bilateral pleural effusions. Findings suggest CHF. Bibasilar pneumonia can not be excluded. No pneumothorax. IMPRESSION: 1.  Lines and tubes in stable position. 2. AICD in stable position. Aortic stent graft and coils in stable position. Stable cardiomegaly. 3. Increased basilar interstitial markings and bilateral pleural effusions. Findings suggest CHF. Bibasilar pneumonia can not be excluded. Electronically Signed   By: Maisie Fus  Register   On: 06/01/2018 05:32   ASSESSMENT AND PLAN:  Ardita Surgeon  is a 68 y.o. female with PMH Aortic dissection, CHF, DM and heart attack.   Cardiac arrest [facility]) -this was PEA  arrest per EMS report, she received 1 round of epinephrine and CPR and obtained ROSC.  She was intubated here in the ED post arrest.   Cardiogenic shock. Off levophed drip.  Acute on chronic systolic CHF EF 20 to 25%. Continue iv lasix.  Repeated chest x-ray: Increased basilar interstitial markings and bilateral pleural effusions. Findings suggest CHF. Bibasilar pneumonia can not be excluded. Clinically no evidence of pneumonia.  Acute respiratory failure with hypoxia due to above. Try to wean off ventilation per Dr. Lonn Georgia.  Hypokalemia.  Improved with KCl supplement.    CAD (coronary artery disease) -continue home medications, postarrest care as above.    Diabetes mellitus type 2, noninsulin dependent (HCC) -sliding scale insulin coverage  I discussed with Dr. Lonn Georgia. All the records are reviewed and case discussed with Care Management/Social  Worker. Management plans discussed with the patient's son and they are in agreement.  CODE STATUS: Full Code  TOTAL TIME TAKING CARE OF THIS PATIENT: 28 minutes.   More than 50% of the time was spent in counseling/coordination of care: YES  POSSIBLE D/C IN ? DAYS, DEPENDING ON CLINICAL CONDITION.   Shaune Pollack M.D on 06/01/2018 at 3:14 PM  Between 7am to 6pm - Pager - [phone]  After 6pm go to [email or web address] - Therapist, nutritional Hospitalists

## 2018-06-01 NOTE — Progress Notes (Signed)
Inpatient Diabetes Program Recommendations  AACE/ADA: New Consensus Statement on Inpatient Glycemic Control  Target Ranges:  Prepandial:   less than 140 mg/dL      Peak postprandial:   less than 180 mg/dL (1-2 hours)      Critically ill patients:  140 - 180 mg/dL   Results for Desiree Mccall, Desiree Mccall (MRN [id number]) as of 06/01/2018 12:03  Ref. Range 05/31/2018 07:17 05/31/2018 11:42 05/31/2018 15:58 05/31/2018 19:33 06/01/2018 00:21 06/01/2018 04:04 06/01/2018 08:01  Glucose-Capillary Latest Ref Range: 70 - 99 mg/dL 144 (H) 315 (H) 400 (H) 224 (H) 285 (H) 278 (H) 296 (H)   Review of Glycemic Control  Current orders for Inpatient glycemic control: Novolog 0-15 units Q4H; Solumedrol 40 mg Q12H; Vital @ 40 ml/hr  Inpatient Diabetes Program Recommendations:   Insulin - Basal: If Solumedrol is continued, please consider ordering Lantus 10 units Q24H.  Insulin-Tube Feeding: Please consider ordering Novolog 3 units Q4H for tube feeding coverage. If tube feeding is stopped or held then Novolog tube feeding coverage should also be stopped or held.  Thanks, Orlando Penner, RN, MSN, CDE Diabetes Coordinator Inpatient Diabetes Program [phone] (Team Pager from 8am to 5pm)

## 2018-06-01 NOTE — Progress Notes (Signed)
Follow up - Critical Care Medicine Note  Patient Details:    Desiree Mccall is an 68 y.o. female.68 y.o. who Patient presents to the ED status post cardiac arrest.  She was at her home when she became acutely short of breath.  She called EMS and when they arrived she very quickly became unresponsive and turned "purple".  Per EMS she was PEA arrest, and received 1 round of epinephrine and CPR and then obtained ROSC.    Patient was admitted to the intensive care unit, status post targeted temperature management.  Intubated on mechanical ventilation  Lines, Airways, Drains: Airway 7.5 mm (Active)  Secured at (cm) 23 cm 06/01/2018  7:19 AM  Measured From Lips 06/01/2018  7:19 AM  Secured Location Center 06/01/2018  7:19 AM  Secured By [employer] 06/01/2018  7:19 AM  Tube Holder Repositioned Yes 06/01/2018  3:16 AM  Cuff Pressure (cm H2O) 30 cm H2O 06/01/2018  3:16 AM  Site Condition Dry 06/01/2018  7:19 AM  Date Prophylactic Dressing Applied (if applicable) 05/27/18 05/31/2018  8:00 AM     CVC Triple Lumen 05/28/18 Right Internal jugular (Active)  Indication for Insertion or Continuance of Line Vasoactive infusions 05/31/2018  8:00 PM  Site Assessment Clean;Dry;Intact 05/31/2018  8:00 PM  Proximal Lumen Status Saline locked;Flushed;Blood return noted 05/31/2018  8:00 PM  Medial Lumen Status Flushed;Blood return noted 05/31/2018  8:00 PM  Distal Lumen Status Flushed;Blood return noted 05/31/2018  8:00 PM  Dressing Type Transparent 05/31/2018  8:00 PM  Dressing Status Intact;Antimicrobial disc in place 05/31/2018  8:00 PM  Line Care Connections checked and tightened 05/31/2018  8:00 PM  Dressing Intervention Dressing changed;Antimicrobial disc changed 05/28/2018  9:00 PM  Dressing Change Due 06/04/18 05/31/2018  8:00 PM     Arterial Line 05/28/18 Right Femoral (Active)  Site Assessment Clean;Dry;Intact 05/31/2018  8:00 PM  Line Status Pulsatile blood flow 05/31/2018  8:00 PM  Art Line Waveform  Appropriate 05/31/2018  8:00 PM  Art Line Interventions Connections checked and tightened 05/31/2018  8:00 PM  Dressing Type Transparent 05/31/2018  8:00 PM  Dressing Status Clean;Dry;Intact 05/31/2018  8:00 PM  Interventions New dressing 05/28/2018  6:55 AM  Dressing Change Due 06/04/18 05/31/2018  8:00 PM     NG/OG Tube Orogastric 18 Fr. Center mouth Documented cm marking at nare/ corner of mouth 64 cm (Active)  Cm Marking at Nare/Corner of Mouth (if applicable) 64 cm 06/01/2018  4:00 AM  Site Assessment Clean;Dry;Intact 06/01/2018  4:00 AM  Ongoing Placement Verification No change in cm markings or external length of tube from initial placement;No change in respiratory status;No acute changes, not attributed to clinical condition;Xray 06/01/2018  4:00 AM  Status Infusing tube feed 06/01/2018  4:00 AM  Drainage Appearance Straw 05/28/2018  5:30 AM     Urethral Catheter Brittney Sparks NTII Temperature probe 16 Fr. (Active)  Indication for Insertion or Continuance of Catheter Unstable critical patients (first 24-48 hours) 06/01/2018  4:00 AM  Site Assessment Clean;Intact 06/01/2018  4:00 AM  Catheter Maintenance Bag below level of bladder;Catheter secured;Drainage bag/tubing not touching floor;Insertion date on drainage bag;No dependent loops;Seal intact 06/01/2018  4:00 AM  Collection Container Standard drainage bag 06/01/2018  4:00 AM  Securement Method Securing device (Describe) 06/01/2018  4:00 AM  Urinary Catheter Interventions Unclamped 06/01/2018  4:00 AM  Output (mL) 60 mL 06/01/2018  6:00 AM    Anti-infectives:  Anti-infectives (From admission, onward)   Start     Dose/Rate  Route Frequency Ordered Stop   05/28/18 0600  piperacillin-tazobactam (ZOSYN) IVPB 3.375 g  Status:  Discontinued     3.375 g 12.5 mL/hr over 240 Minutes Intravenous Every 8 hours 05/28/18 0446 05/28/18 0815   05/27/18 2200  ceFEPIme (MAXIPIME) 1 g in sodium chloride 0.9 % 100 mL IVPB     1 g 200 mL/hr over 30 Minutes  Intravenous  Once 05/27/18 2145 05/27/18 2340   05/27/18 2200  vancomycin (VANCOCIN) IVPB 1000 mg/200 mL premix     1,000 mg 200 mL/hr over 60 Minutes Intravenous  Once 05/27/18 2145 05/28/18 0050      Microbiology: Results for orders placed or performed during the hospital encounter of 05/27/18  Urine Culture     Status: None   Collection Time: 05/27/18  9:20 PM  Result Value Ref Range Status   Specimen Description   Final    URINE, RANDOM Performed at [facility], [address] [place], [place] [address]    Special Requests   Final    NONE Performed at [facility], [address] [place], [place] [address]    Culture   Final    NO GROWTH Performed at [facility], [address] [place], [place] [address]    Report Status 05/29/2018 FINAL  Final  Blood culture (routine x 2)     Status: None   Collection Time: 05/27/18  9:50 PM  Result Value Ref Range Status   Specimen Description BLOOD RIGHT HAND  Final   Special Requests Blood Culture adequate volume  Final   Culture   Final    NO GROWTH 5 DAYS Performed at [facility], [address] [place], [place] [address]    Report Status 06/01/2018 FINAL  Final  Blood culture (routine x 2)     Status: None   Collection Time: 05/27/18 10:57 PM  Result Value Ref Range Status   Specimen Description BLOOD LEFT ANTECUBITAL  Final   Special Requests Blood Culture adequate volume  Final   Culture   Final    NO GROWTH 5 DAYS Performed at [facility], [address] [place], [place] [address]    Report Status 06/01/2018 FINAL  Final  MRSA PCR Screening     Status: None   Collection Time: 05/28/18  2:24 AM  Result Value Ref Range Status   MRSA by PCR NEGATIVE NEGATIVE Final    Comment:        The GeneXpert MRSA Assay (FDA approved for NASAL specimens only), is one component of a comprehensive MRSA colonization surveillance program. It is not intended to diagnose  MRSA infection nor to guide or monitor treatment for MRSA infections. Performed at [facility], [address] [place], [place] [address]   Culture, respiratory (non-expectorated)     Status: None   Collection Time: 05/28/18  4:29 AM  Result Value Ref Range Status   Specimen Description   Final    TRACHEAL ASPIRATE Performed at [facility], [address], [place], [place] [address]    Special Requests   Final    NONE Performed at [facility], [address] [place], [place] [address]    Gram Stain   Final    ABUNDANT WBC PRESENT,BOTH PMN AND MONONUCLEAR FEW GRAM POSITIVE COCCI FEW GRAM POSITIVE RODS RARE GRAM NEGATIVE RODS    Culture   Final    FEW Consistent with normal respiratory flora. Performed at [facility],  [address], [place], [place] [address]    Report Status 05/30/2018 FINAL  Final  Culture, respiratory (non-expectorated)     Status: None (Preliminary result)   Collection Time: 05/31/18  3:32 PM  Result Value Ref Range Status   Specimen Description   Final    TRACHEAL ASPIRATE Performed at [facility], [address], [place], [place] [address]    Special Requests   Final    NONE Performed at [facility], [address] [place], [place] [address]    Gram Stain   Final    MODERATE WBC PRESENT, PREDOMINANTLY PMN FEW GRAM POSITIVE COCCI RARE GRAM POSITIVE RODS Performed at [facility], [address] [place], [place] [address]    Culture PENDING  Incomplete   Report Status PENDING  Incomplete    Best Practice/Protocols:  Prophylaxis with heparin subcutaneous and Pepcid IV  Studies: Ct Head Wo Contrast  Result Date: 05/28/2018 CLINICAL DATA:  Recent cardiac arrest. EXAM: CT HEAD WITHOUT CONTRAST TECHNIQUE: Contiguous axial images were obtained from the base of the skull through the vertex without intravenous contrast. COMPARISON:  Head CT 08/02/2016 FINDINGS: Brain: There is no  mass, hemorrhage or extra-axial collection. The size and configuration of the ventricles and extra-axial CSF spaces are normal. There is hypoattenuation of the white matter, most commonly indicating chronic small vessel disease. Vascular: There is retained intravascular contrast material secondary to earlier CTA of the chest. Skull: The visualized skull base, calvarium and extracranial soft tissues are normal. Sinuses/Orbits: No fluid levels or advanced mucosal thickening of the visualized paranasal sinuses. No mastoid or middle ear effusion. The orbits are normal. IMPRESSION: No acute intracranial abnormality. Electronically Signed   By: Deatra Robinson M.D.   On: 05/28/2018 03:39   Dg Chest Port 1 View  Result Date: 06/01/2018 CLINICAL DATA:  Respiratory failure. EXAM: PORTABLE CHEST 1 VIEW COMPARISON:  05/31/2018. FINDINGS: Endotracheal tube, NG tube, right IJ line in stable position. AICD in stable position. Aortic stent graft and coils in stable position. Stable cardiomegaly. Increased interstitial markings and bilateral pleural effusions. Findings suggest CHF. Bibasilar pneumonia can not be excluded. No pneumothorax. IMPRESSION: 1.  Lines and tubes in stable position. 2. AICD in stable position. Aortic stent graft and coils in stable position. Stable cardiomegaly. 3. Increased basilar interstitial markings and bilateral pleural effusions. Findings suggest CHF. Bibasilar pneumonia can not be excluded. Electronically Signed   By: Maisie Fus  Register   On: 06/01/2018 05:32   Dg Chest Port 1 View  Result Date: 05/31/2018 CLINICAL DATA:  Respiratory failure. EXAM: PORTABLE CHEST 1 VIEW COMPARISON:  05/31/2002 FINDINGS: The endotracheal tube is 3.8 cm above the carina. The NG tube is coursing down the esophagus and into the stomach. The right IJ catheter is stable. The pacer wires/AICD are stable. Stable thoracic aortic stent graft. The heart is mildly enlarged but stable. Suspect mild interstitial edema in the  lungs but no infiltrates or effusions. IMPRESSION: 1. Stable support apparatus without complicating features. 2. Stable cardiac enlargement and probable mild interstitial edema but no infiltrates or effusions. Electronically Signed   By: Rudie Meyer M.D.   On: 05/31/2018 14:40   Dg Chest Port 1 View  Result Date: 05/31/2018 CLINICAL DATA:  Acute respiratory failure EXAM: PORTABLE CHEST 1 VIEW COMPARISON:  Yesterday FINDINGS: Cardiomegaly with ICD/pacer leads in stable position. Thoracic aortic stent graft. Right IJ line with tip at the SVC. Endotracheal tube with tip between the clavicular heads and carina. The  orogastric tube reaches the stomach. Improved aeration and diaphragm visualization on the right. There is still hazy density at the left base at least partially from pleural fluid. No Kerley lines or pneumothorax. IMPRESSION: 1. Stable, unremarkable hardware positioning. 2. Continued retrocardiac opacification from pleural fluid and atelectasis based on admission chest CT. Electronically Signed   By: Marnee SpringJonathon  Watts M.D.   On: 05/31/2018 06:35   Dg Chest Port 1 View  Result Date: 05/30/2018 CLINICAL DATA:  Acute respiratory failure EXAM: PORTABLE CHEST 1 VIEW COMPARISON:  05/28/2018 FINDINGS: Endotracheal tube, gastric catheter and right-sided jugular central line are again seen and stable. Defibrillator is again noted and stable. Stable cardiomegaly and aortic stent graft are seen. Mild central vascular congestion is again identified stable from the prior exam. Haziness is noted in the bases bilaterally suggestive of small effusions. No other focal abnormality is seen. IMPRESSION: Overall stable appearance of the chest when compared with the prior exam. Electronically Signed   By: Alcide CleverMark  Lukens M.D.   On: 05/30/2018 07:08   Dg Chest Port 1 View  Result Date: 05/28/2018 CLINICAL DATA:  ET tube placement Patient is a 68yo female that is s/p cardiac arrest. Patient was initiated on hypothermia  protocol with pharmacy consult to manage electrolytes EXAM: PORTABLE CHEST - 1 VIEW COMPARISON:  Earlier film of the same day FINDINGS: Endotracheal tube, nasogastric tube, right IJ central venous catheter stable in position. Stable left subclavian AICD. Thoracic stent graft and embolization coils appear unchanged. Relatively low lung volumes with some increase in pulmonary vascular congestion and possible mild interstitial edema. Heart size upper limits normal for technique. No effusion. No pneumothorax. Regional bones unremarkable. IMPRESSION: 1. Low volumes with some increase in pulmonary vascular congestion and possible mild interstitial edema. 2. Support hardware stable in position. Electronically Signed   By: Corlis Leak  Hassell M.D.   On: 05/28/2018 13:51   Dg Chest Port 1 View  Result Date: 05/28/2018 CLINICAL DATA:  Orogastric tube placement, central line placement. EXAM: PORTABLE CHEST 1 VIEW COMPARISON:  Radiographs of May 27, 2018. FINDINGS: Stable cardiomegaly. Status post stent graft repair of descending thoracic aorta. Endotracheal and nasogastric tubes are unchanged in position. Left-sided pacemaker is unchanged in position. No pneumothorax or significant pleural effusion is noted. Minimal left basilar subsegmental atelectasis is noted. Right lung is clear. Bony thorax is unremarkable. IMPRESSION: Minimal left basilar subsegmental atelectasis. Stable support apparatus. Electronically Signed   By: Lupita RaiderJames  Green Jr, M.D.   On: 05/28/2018 08:07   Dg Chest Portable 1 View  Result Date: 05/27/2018 CLINICAL DATA:  Status post intubation EXAM: PORTABLE CHEST 1 VIEW COMPARISON:  08/02/2016 FINDINGS: Endotracheal tube is noted approximately 1 cm above the carina. It was subsequently withdrawn and reimaged now lying 4 cm above the carina. Changes of prior aortic stent graft are seen and stable. Defibrillator is again noted. Cardiac shadow is again enlarged. The lungs are well aerated. Some patchy  infiltrative changes are noted in the upper lobes bilaterally. No bony abnormality is noted. IMPRESSION: Endotracheal tube in satisfactory position. Patchy infiltrates in the upper lobes bilaterally Postsurgical changes. Electronically Signed   By: Alcide CleverMark  Lukens M.D.   On: 05/27/2018 21:41   Ct Angio Chest/abd/pel For Dissection W And/or Wo Contrast  Result Date: 05/27/2018 CLINICAL DATA:  Cardiac arrest. EXAM: CT ANGIOGRAPHY CHEST, ABDOMEN AND PELVIS TECHNIQUE: Multidetector CT imaging through the chest, abdomen and pelvis was performed using the standard protocol during bolus administration of intravenous contrast. Multiplanar reconstructed images and MIPs  were obtained and reviewed to evaluate the vascular anatomy. CONTRAST:  125mL ISOVUE-370 IOPAMIDOL (ISOVUE-370) INJECTION 76% COMPARISON:  Radiograph earlier this day. Chest in abdominal CT dissection protocol 09/19/2009. Report from dissection protocol CT at an outside institution 05/05/2017 FINDINGS: CTA CHEST FINDINGS Cardiovascular: Redemonstration of type B aortic dissection post endovascular stent graft repair. Graft regional nodes at the level of the left subclavian which is occluded as described previously. There is distal reconstitution of the subclavian, not well assessed due to streak artifact from venous contrast in the axilla. Brachiocephalic and left common carotid artery remain patent. Ascending aorta is normal in caliber. Embolization coils the level of the distal arch lies aortic isthmus, as described on prior. The endovascular stent remains patent. False lumen surrounding the stent is thrombosed which was described previously, residual luminal dimension of 5.0 x 4.2 cm, previously reported 4.8 x 4.2 cm. Immediately distal to the stent graft in the descending thoracic aorta is focal opacification of the false lumen distal to this outpouching the false lumen is thrombosed, which was described previously. Pacemaker in place. Mild cardiomegaly.  No central pulmonary embolus. No pericardial effusion. Mediastinum/Nodes: Prominent soft tissue density at the right hilum likely lymph nodes, not well assessed due to adjacent lung consolidation. Otherwise no evidence of adenopathy. Esophagus is decompressed. Endotracheal tube tip above the carina. Lungs/Pleura: Perihilar ground-glass opacities, likely pulmonary edema. Dense consolidation in the dependent right upper lobe with air bronchograms small bilateral pleural effusions with adjacent compressive atelectasis, greater on the right. There is septal thickening the apices in the bases consistent with pulmonary edema. 7 mm right middle lobe pulmonary nodule, unchanged from 2011 and considered benign. Probable debris in the right mainstem bronchus causing luminal narrowing to the lower lobe. No pneumothorax. Musculoskeletal: Buckle fractures of anterior lower ribs, commonly seen with CPR. No sternal fracture. Thoracic spine is intact. Review of the MIP images confirms the above findings. CTA ABDOMEN AND PELVIS FINDINGS VASCULAR Aorta: Chronic dissection, at the diaphragmatic hiatus there is opacification of the false lumen. Dissection extends to the iliac bifurcation. Flow within both the true and false lumen to the iliac bifurcation. Thrombus within the false lumen infrarenal E, as described on prior exam. 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 No periaortic stranding to suggest rupture. Celiac: Patent arising from the true lumen.  No dissection. SMA: Patent arising from the true lumen.  No dissection. Renals: 2 right renal arteries which received flow from the true lumen. Left renal artery receives flow from both the true and false lumen with early luminal branching. IMA: Patent receiving flow from the true lumen. Inflow: Bilateral common iliac stents are in place and remain patent. Dissection extends into the left common iliac artery along the stent proximally and persisting beyond the  stent, as described previously. Dissection flap extends to the proximal-most left internal iliac artery. Normal flow within both internal and external iliac arteries. Veins: No obvious venous abnormality within the limitations of this arterial phase study. Review of the MIP images confirms the above findings. NON-VASCULAR Hepatobiliary: No focal hepatic abnormality on arterial phase exam. Postcholecystectomy without biliary dilatation. Pancreas: No ductal dilatation or inflammation. Spleen: Normal in size without focal abnormality. Adrenals/Urinary Tract: Normal adrenal glands. Homogeneous enhancement of both kidneys without hydronephrosis. Urinary bladder is decompressed by Foley catheter. Stomach/Bowel: Ingested material in the stomach. No gastric wall thickening. No small bowel obstruction or inflammatory change. Moderate stool burden throughout the entire colon. No colonic wall thickening or inflammation.  There is sigmoid colonic tortuosity. No abnormal rectal distention. The appendix is not visualized. Lymphatic: No abdominopelvic adenopathy. Reproductive: Status post hysterectomy. No adnexal masses. Other: No free air free fluid. Musculoskeletal: Degenerative disc disease at L5-S1. There are no acute or suspicious osseous abnormalities. Review of the MIP images confirms the above findings. IMPRESSION: 1. 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 Thoracic dissection extends into the abdominal aorta through the left common iliac artery as before. No evidence of aortic rupture or acute findings. 2. Cardiomegaly with pulmonary edema. Dense consolidation in the right upper lobe with debris in the right mainstem bronchus, highly suggestive of aspiration. Small bilateral pleural effusions with adjacent lower lobe atelectasis. 3. Buckle fractures of bilateral anterior ribs, commonly  seen with CPR. 4. No acute findings in the abdomen or pelvis. Large colonic stool burden suggesting constipation. Electronically Signed   By: Narda Rutherford M.D.   On: 05/27/2018 23:05    Consults: Treatment Team:  Thana Farr, MD Laurier Nancy, MD   Subjective:    Overnight Issues: No events overnight.  Patient does have some agitation this morning  Objective:  Vital signs for last 24 hours: Temp:  [98.6 F (37 C)-99.5 F (37.5 C)] 98.6 F (37 C) (01/31 0630) Pulse Rate:  [71-140] 72 (01/31 0630) Resp:  [17-42] 20 (01/31 0630) BP: (104-156)/(48-69) 133/54 (01/31 0600) SpO2:  [87 %-100 %] 96 % (01/31 0630) Arterial Line BP: (90-280)/(44-140) 115/55 (01/31 0630) FiO2 (%):  [35 %] 35 % (01/31 0719) Weight:  [105.4 kg] 105.4 kg (01/31 0420)  Hemodynamic parameters for last 24 hours:    Intake/Output from previous day: 01/30 0701 - 01/31 0700 In: 1408.6 [I.V.:398.6; NG/GT:960; IV Piggyback:50] Out: 1700 [Urine:1700]  Intake/Output this shift: No intake/output data recorded.  Vent settings for last 24 hours: Vent Mode: PRVC FiO2 (%):  [35 %] 35 % Set Rate:  [20 bmp-24 bmp] 20 bmp Vt Set:  [500 mL] 500 mL PEEP:  [5 cmH20] 5 cmH20 Pressure Support:  [5 cmH20] 5 cmH20 Plateau Pressure:  [17 cmH20-19 cmH20] 18 cmH20  Physical Exam:  GENERAL:critically ill appearing.  Alert and responsive HEAD: Normocephalic, atraumatic.  EYES: Pupils equal, round, reactive to light.  No scleral icterus.  MOUTH: Moist mucosal membrane, orally intubated, orogastric tube in place NECK: Supple. No thyromegaly. No nodules. No JVD.  PULMONARY: Clear this morning CARDIOVASCULAR: S1 and S2. Regular rate and rhythm. No murmurs, rubs, or gallops.  GASTROINTESTINAL: Soft, nontender, -distended. No masses. Positive bowel sounds. No hepatosplenomegaly.  MUSCULOSKELETAL: No swelling, clubbing, or edema.  NEUROLOGIC: obtunded SKIN:intact,warm,dry  Assessment/Plan:   Status post cardiac  arrest.  Patient with witnessed PEA arrest, status post targeted temperature management.  Felt to be primary cardiac event.  Echocardiogram reveals severe LV dysfunction with ejection fraction of 20 to 25%.  Prior history of coronary artery disease.  Hypoxemic respiratory failure.  No clinical evidence of infection, patient empirically on steroids and bronchodilators.  We will add Precedex for agitation, begin spontaneous awakening and breathing trials and evaluate parameters for extubation  Hyperglycemia.  Sliding scale coverage  Prerenal azotemia.  Will follow BUN and creatinine closely  Anemia.  No evidence of active bleeding  Critical Care Total Time 35 minutes  Bronislaw Switzer 06/01/2018  *Care during the described time interval was provided by me and/or other providers on the critical care team.  I have reviewed this  patient's available data, including medical history, events of note, physical examination and test results as part of my evaluation.

## 2018-06-02 ENCOUNTER — Inpatient Hospital Stay: Payer: Medicare HMO

## 2018-06-02 LAB — BASIC METABOLIC PANEL
ANION GAP: 5 (ref 5–15)
BUN: 37 mg/dL — ABNORMAL HIGH (ref 8–23)
CO2: 37 mmol/L — ABNORMAL HIGH (ref 22–32)
Calcium: 9.4 mg/dL (ref 8.9–10.3)
Chloride: 104 mmol/L (ref 98–111)
Creatinine, Ser: 0.5 mg/dL (ref 0.44–1.00)
GFR calc Af Amer: >60 mL/min (ref >60)
GFR calc non Af Amer: >60 mL/min (ref >60)
Glucose, Bld: 166 mg/dL — ABNORMAL HIGH (ref 70–99)
Potassium: 4.1 mmol/L (ref 3.5–5.1)
Sodium: 146 mmol/L — ABNORMAL HIGH (ref 135–145)

## 2018-06-02 LAB — CBC
HCT: 35.6 % — ABNORMAL LOW (ref 36.0–46.0)
Hemoglobin: 10.7 g/dL — ABNORMAL LOW (ref 12.0–15.0)
MCH: 26.6 pg (ref 26.0–34.0)
MCHC: 30.1 g/dL (ref 30.0–36.0)
MCV: 88.3 fL (ref 80.0–100.0)
NRBC: 0 % (ref 0.0–0.2)
Platelets: 201 10*3/uL (ref 150–400)
RBC: 4.03 MIL/uL (ref 3.87–5.11)
RDW: 16.2 % — ABNORMAL HIGH (ref 11.5–15.5)
WBC: 12 10*3/uL — AB (ref 4.0–10.5)

## 2018-06-02 LAB — GLUCOSE, CAPILLARY
Glucose-Capillary: 178 mg/dL — ABNORMAL HIGH (ref 70–99)
Glucose-Capillary: 189 mg/dL — ABNORMAL HIGH (ref 70–99)
Glucose-Capillary: 189 mg/dL — ABNORMAL HIGH (ref 70–99)
Glucose-Capillary: 191 mg/dL — ABNORMAL HIGH (ref 70–99)
Glucose-Capillary: 194 mg/dL — ABNORMAL HIGH (ref 70–99)
Glucose-Capillary: 195 mg/dL — ABNORMAL HIGH (ref 70–99)

## 2018-06-02 MED ORDER — ONDANSETRON HCL 4 MG/2ML IJ SOLN
INTRAMUSCULAR | Status: AC
  Administered 2018-06-02: 13:00:00
  Filled 2018-06-02: qty 2

## 2018-06-02 MED ORDER — ORAL CARE MOUTH RINSE
15.0000 mL | Freq: Two times a day (BID) | OROMUCOSAL | Status: DC
  Administered 2018-06-02 – 2018-06-05 (×6): 15 mL via OROMUCOSAL

## 2018-06-02 MED ORDER — FUROSEMIDE 10 MG/ML IJ SOLN
20.0000 mg | Freq: Once | INTRAMUSCULAR | Status: DC
  Filled 2018-06-02: qty 2

## 2018-06-02 MED ORDER — ONDANSETRON HCL 4 MG/2ML IJ SOLN
4.0000 mg | Freq: Four times a day (QID) | INTRAMUSCULAR | Status: DC | PRN
  Administered 2018-06-03 (×2): 4 mg via INTRAVENOUS
  Filled 2018-06-02 (×2): qty 2

## 2018-06-02 MED ORDER — PANTOPRAZOLE SODIUM 40 MG PO PACK
40.0000 mg | PACK | Freq: Every day | ORAL | Status: DC
  Administered 2018-06-02 – 2018-06-04 (×2): 40 mg via ORAL
  Filled 2018-06-02 (×4): qty 20

## 2018-06-02 MED ORDER — FUROSEMIDE 10 MG/ML IJ SOLN
INTRAMUSCULAR | Status: AC
  Administered 2018-06-02: 20:00:00
  Filled 2018-06-02: qty 2

## 2018-06-02 MED ORDER — ADULT MULTIVITAMIN W/MINERALS CH
1.0000 | ORAL_TABLET | Freq: Every day | ORAL | Status: DC
  Administered 2018-06-03 – 2018-06-05 (×3): 1 via ORAL
  Filled 2018-06-02 (×2): qty 1

## 2018-06-02 MED ORDER — PROSIGHT PO TABS
1.0000 | ORAL_TABLET | Freq: Every day | ORAL | Status: DC
  Filled 2018-06-02: qty 1

## 2018-06-02 MED ORDER — SENNOSIDES 8.8 MG/5ML PO SYRP
5.0000 mL | ORAL_SOLUTION | Freq: Two times a day (BID) | ORAL | Status: DC | PRN
  Administered 2018-06-02: 5 mL via ORAL
  Filled 2018-06-02 (×2): qty 5

## 2018-06-02 MED ORDER — FAMOTIDINE 20 MG PO TABS
20.0000 mg | ORAL_TABLET | Freq: Two times a day (BID) | ORAL | Status: DC
  Administered 2018-06-02 – 2018-06-05 (×5): 20 mg via ORAL
  Filled 2018-06-02 (×5): qty 1

## 2018-06-02 NOTE — Progress Notes (Signed)
[employer] - [place] [facility]   PATIENT NAME: Desiree Mccall    MR#:  [id number]  DATE OF BIRTH:  [birth year]  SUBJECTIVE:  CHIEF COMPLAINT:   Chief Complaint  Patient presents with  . Cardiac Arrest   The patient was extubated yesterday.  She has no complaints.  On oxygen by nasal cannula 3 L. REVIEW OF SYSTEMS:  Review of Systems  Constitutional: Positive for malaise/fatigue. Negative for chills and fever.  HENT: Negative for sore throat.   Eyes: Negative for blurred vision and double vision.  Respiratory: Negative for cough, hemoptysis, shortness of breath, wheezing and stridor.   Cardiovascular: Negative for chest pain, palpitations, orthopnea and leg swelling.  Gastrointestinal: Negative for abdominal pain, blood in stool, diarrhea, melena, nausea and vomiting.  Genitourinary: Negative for dysuria, flank pain and hematuria.  Musculoskeletal: Negative for back pain and joint pain.  Skin: Negative for rash.  Neurological: Negative for dizziness, sensory change, focal weakness, seizures, loss of consciousness, weakness and headaches.  Endo/Heme/Allergies: Negative for polydipsia.  Psychiatric/Behavioral: Negative for depression. The patient is not nervous/anxious.     DRUG ALLERGIES:  [name] VITALS:  Blood pressure (!) 179/89, pulse 68, temperature 98.2 F (36.8 C), temperature source Bladder, resp. rate 14, height 5\' 8"  (1.727 m), weight 106.6 kg, SpO2 96 %. PHYSICAL EXAMINATION:  Physical Exam Constitutional:      General: She is not in acute distress.    Appearance: Normal appearance.     Comments: Intubated. Morbid obesity.  HENT:     Head: Normocephalic.     Mouth/Throat:     Mouth: Mucous membranes are dry.  Eyes:     General: No scleral icterus.    Conjunctiva/sclera: Conjunctivae normal.     Pupils: Pupils are equal, round, and reactive to light.  Neck:     Musculoskeletal: Normal range of motion and neck supple.   Vascular: No JVD.     Trachea: No tracheal deviation.  Cardiovascular:     Rate and Rhythm: Normal rate and regular rhythm.     Heart sounds: Normal heart sounds. No murmur. No gallop.   Pulmonary:     Effort: Pulmonary effort is normal. No respiratory distress.     Breath sounds: Normal breath sounds. No stridor. No wheezing, rhonchi or rales.  Abdominal:     General: Bowel sounds are normal. There is no distension.     Palpations: Abdomen is soft.     Tenderness: There is no abdominal tenderness. There is no guarding or rebound.     Comments: Diminished bowel sound.  Musculoskeletal: Normal range of motion.        General: No tenderness.     Right lower leg: No edema.     Left lower leg: No edema.  Skin:    Findings: No erythema or rash.  Neurological:     General: No focal deficit present.     Mental Status: She is alert and oriented to person, place, and time.     Cranial Nerves: No cranial nerve deficit.     Sensory: No sensory deficit.  Psychiatric:        Mood and Affect: Mood normal.    LABORATORY PANEL:  Female CBC Recent Labs  Lab 06/02/18 0428  WBC 12.0*  HGB 10.7*  HCT 35.6*  PLT 201   ------------------------------------------------------------------------------------------------------------------ Chemistries  Recent Labs  Lab 05/27/18 2120  05/31/18 0534  06/02/18 0428  NA 142   < > 144   < >  146*  K 4.0   < > 3.8   < > 4.1  CL 106   < > 107   < > 104  CO2 20*   < > 34*   < > 37*  GLUCOSE 343*   < > 183*   < > 166*  BUN 18   < > 33*   < > 37*  CREATININE 0.96   < > 0.55   < > 0.50  CALCIUM 8.6*   < > 8.6*   < > 9.4  MG  --    < > 2.0  --   --   AST 229*  --   --   --   --   ALT 135*  --   --   --   --   ALKPHOS 155*  --   --   --   --   BILITOT 0.7  --   --   --   --    < > = values in this interval not displayed.   RADIOLOGY:  Dg Chest Port 1 View  Result Date: 06/02/2018 CLINICAL DATA:  Acute respiratory failure. EXAM: PORTABLE CHEST 1 VIEW  COMPARISON:  06/01/2018. FINDINGS: The heart is enlarged. ET tube removed. Unchanged central venous catheter tip distal SVC. Unchanged AICD device. Aortic stent graft. Improved aeration, with clearing of edema and basilar atelectasis. Small LEFT effusion remains. IMPRESSION: Improved aeration. Electronically Signed   By: Elsie Stain M.D.   On: 06/02/2018 07:39   ASSESSMENT AND PLAN:  Desiree Mccall  is a 68 y.o. female with PMH Aortic dissection, CHF, DM and heart attack.   Cardiac arrest [facility]) -this was PEA arrest per EMS report, she received 1 round of epinephrine and CPR and obtained ROSC.  She was intubated here in the ED post arrest.   Cardiogenic shock. Off levophed drip.  Acute on chronic systolic CHF EF 20 to 25%. Continue iv lasix.  Repeated chest x-ray: Increased basilar interstitial markings and bilateral pleural effusions. Findings suggest CHF. Bibasilar pneumonia can not be excluded. Clinically no evidence of pneumonia.   Acute respiratory failure with hypoxia due to above. The patient was successfully extubated yesterday.  On oxygen by nasal cannula.  Continue nebulizer and Lasix.  Hypokalemia.  Improved with KCl supplement.    CAD (coronary artery disease) -continue home medications.    Diabetes mellitus type 2, noninsulin dependent (HCC) -sliding scale insulin coverage  Hypertension.  May resume home hypertension medication.  IV hydralazine PRN.  Dehydration due to diuretics.  Follow-up BMP.  All the records are reviewed and case discussed with Care Management/Social Worker. Management plans discussed with the patient's daughter and they are in agreement.  CODE STATUS: Full Code  TOTAL TIME TAKING CARE OF THIS PATIENT: 33 minutes.   More than 50% of the time was spent in counseling/coordination of care: YES  POSSIBLE D/C IN 2-3 DAYS, DEPENDING ON CLINICAL CONDITION.   Shaune Pollack M.D on 06/02/2018 at 12:32 PM  Between 7am to 6pm - Pager - [phone]  After 6pm  go to [email or web address] - Therapist, nutritional Hospitalists

## 2018-06-02 NOTE — Progress Notes (Signed)
ST bedside swallow eval: Bedside swallow eval completed earlier today revealed no over s/s of aspiration with any tested consistencies. See flowsheet for details. Oral mech exam revealed structures to be functioning adequately with slight weakness. Pt needed extended time to masticate solids. Mild oral residue which Pt cleared with alternating liquids with solids. Vocal quality was hoarse from recent intubation but remained clear throughout assessment. Rec Dys 2 diet with thin liquids for now. ST to reassess for upgrade as Pt's over status improves. Rec meds whole in applesauce. Nsg and family educated on diet and plan. ST to f/u 1-2 days.

## 2018-06-02 NOTE — Progress Notes (Signed)
Follow up - Critical Care Medicine Note  Patient Details:    Desiree Mccall is an 68 y.o. female.68 y.o. who Patient presents to the ED status post cardiac arrest.  She was at her home when she became acutely short of breath.  She called EMS and when they arrived she very quickly became unresponsive and turned "purple".  Per EMS she was PEA arrest, and received 1 round of epinephrine and CPR and then obtained ROSC.    Patient was admitted to the intensive care unit, status post targeted temperature management.   Lines, Airways, Drains: Airway 7.5 mm (Active)  Secured at (cm) 23 cm 06/01/2018  7:19 AM  Measured From Lips 06/01/2018  7:19 AM  Secured Location Center 06/01/2018  7:19 AM  Secured By [employer] T[employer] Holder 06/01/2018  7:19 AM  Tube Holder Repositioned Yes 06/01/2018  3:16 AM  Cuff Pressure (cm H2O) 30 cm H2O 06/01/2018  3:16 AM  Site Condition Dry 06/01/2018  7:19 AM  Date Prophylactic Dressing Applied (if applicable) 05/27/18 05/31/2018  8:00 AM     CVC Triple Lumen 05/28/18 Right Internal jugular (Active)  Indication for Insertion or Continuance of Line Vasoactive infusions 05/31/2018  8:00 PM  Site Assessment Clean;Dry;Intact 05/31/2018  8:00 PM  Proximal Lumen Status Saline locked;Flushed;Blood return noted 05/31/2018  8:00 PM  Medial Lumen Status Flushed;Blood return noted 05/31/2018  8:00 PM  Distal Lumen Status Flushed;Blood return noted 05/31/2018  8:00 PM  Dressing Type Transparent 05/31/2018  8:00 PM  Dressing Status Intact;Antimicrobial disc in place 05/31/2018  8:00 PM  Line Care Connections checked and tightened 05/31/2018  8:00 PM  Dressing Intervention Dressing changed;Antimicrobial disc changed 05/28/2018  9:00 PM  Dressing Change Due 06/04/18 05/31/2018  8:00 PM     Arterial Line 05/28/18 Right Femoral (Active)  Site Assessment Clean;Dry;Intact 05/31/2018  8:00 PM  Line Status Pulsatile blood flow 05/31/2018  8:00 PM  Art Line Waveform Appropriate 05/31/2018  8:00 PM  Art  Line Interventions Connections checked and tightened 05/31/2018  8:00 PM  Dressing Type Transparent 05/31/2018  8:00 PM  Dressing Status Clean;Dry;Intact 05/31/2018  8:00 PM  Interventions New dressing 05/28/2018  6:55 AM  Dressing Change Due 06/04/18 05/31/2018  8:00 PM     NG/OG Tube Orogastric 18 Fr. Center mouth Documented cm marking at nare/ corner of mouth 64 cm (Active)  Cm Marking at Nare/Corner of Mouth (if applicable) 64 cm 06/01/2018  4:00 AM  Site Assessment Clean;Dry;Intact 06/01/2018  4:00 AM  Ongoing Placement Verification No change in cm markings or external length of tube from initial placement;No change in respiratory status;No acute changes, not attributed to clinical condition;Xray 06/01/2018  4:00 AM  Status Infusing tube feed 06/01/2018  4:00 AM  Drainage Appearance Straw 05/28/2018  5:30 AM     Urethral Catheter Brittney Sparks NTII Temperature probe 16 Fr. (Active)  Indication for Insertion or Continuance of Catheter Unstable critical patients (first 24-48 hours) 06/01/2018  4:00 AM  Site Assessment Clean;Intact 06/01/2018  4:00 AM  Catheter Maintenance Bag below level of bladder;Catheter secured;Drainage bag/tubing not touching floor;Insertion date on drainage bag;No dependent loops;Seal intact 06/01/2018  4:00 AM  Collection Container Standard drainage bag 06/01/2018  4:00 AM  Securement Method Securing device (Describe) 06/01/2018  4:00 AM  Urinary Catheter Interventions Unclamped 06/01/2018  4:00 AM  Output (mL) 60 mL 06/01/2018  6:00 AM    Anti-infectives:  Anti-infectives (From admission, onward)   Start     Dose/Rate Route Frequency Ordered Stop  05/28/18 0600  piperacillin-tazobactam (ZOSYN) IVPB 3.375 g  Status:  Discontinued     3.375 g 12.5 mL/hr over 240 Minutes Intravenous Every 8 hours 05/28/18 0446 05/28/18 0815   05/27/18 2200  ceFEPIme (MAXIPIME) 1 g in sodium chloride 0.9 % 100 mL IVPB     1 g 200 mL/hr over 30 Minutes Intravenous  Once 05/27/18 2145 05/27/18  2340   05/27/18 2200  vancomycin (VANCOCIN) IVPB 1000 mg/200 mL premix     1,000 mg 200 mL/hr over 60 Minutes Intravenous  Once 05/27/18 2145 05/28/18 0050      Microbiology: Results for orders placed or performed during the hospital encounter of 05/27/18  Urine Culture     Status: None   Collection Time: 05/27/18  9:20 PM  Result Value Ref Range Status   Specimen Description   Final    URINE, RANDOM Performed at [facility], [address], [place], [place] [address]    Special Requests   Final    NONE Performed at [facility], [address], [place], [place] [address]    Culture   Final    NO GROWTH Performed at [facility], [address] [place], [place] [address]    Report Status 05/29/2018 FINAL  Final  Blood culture (routine x 2)     Status: None   Collection Time: 05/27/18  9:50 PM  Result Value Ref Range Status   Specimen Description BLOOD RIGHT HAND  Final   Special Requests Blood Culture adequate volume  Final   Culture   Final    NO GROWTH 5 DAYS Performed at [facility], [address] [place], [place] [address]    Report Status 06/01/2018 FINAL  Final  Blood culture (routine x 2)     Status: None   Collection Time: 05/27/18 10:57 PM  Result Value Ref Range Status   Specimen Description BLOOD LEFT ANTECUBITAL  Final   Special Requests Blood Culture adequate volume  Final   Culture   Final    NO GROWTH 5 DAYS Performed at [facility], [address] [place], [place] [address]    Report Status 06/01/2018 FINAL  Final  MRSA PCR Screening     Status: None   Collection Time: 05/28/18  2:24 AM  Result Value Ref Range Status   MRSA by PCR NEGATIVE NEGATIVE Final    Comment:        The GeneXpert MRSA Assay (FDA approved for NASAL specimens only), is one component of a comprehensive MRSA colonization surveillance program. It is not intended to diagnose MRSA infection nor to guide or monitor treatment  for MRSA infections. Performed at [facility], [address] [place], [place] [address]   Culture, respiratory (non-expectorated)     Status: None   Collection Time: 05/28/18  4:29 AM  Result Value Ref Range Status   Specimen Description   Final    TRACHEAL ASPIRATE Performed at [facility], [address] [place], [place] [address]    Special Requests   Final    NONE Performed at [facility], [address] [place], [place] [address]    Gram Stain   Final    ABUNDANT WBC PRESENT,BOTH PMN AND MONONUCLEAR FEW GRAM POSITIVE COCCI FEW GRAM POSITIVE RODS RARE GRAM NEGATIVE RODS    Culture   Final    FEW Consistent with normal respiratory flora. Performed at [facility], [address], [place], [place]  [address]    Report Status 05/30/2018 FINAL  Final  Culture, respiratory (non-expectorated)     Status: None (Preliminary result)   Collection Time: 05/31/18  3:32 PM  Result Value Ref Range Status   Specimen Description   Final    TRACHEAL ASPIRATE Performed at [facility], [address], [place], [place] [address]    Special Requests   Final    NONE Performed at [facility], [address] [place], [place] [address]    Gram Stain   Final    MODERATE WBC PRESENT, PREDOMINANTLY PMN FEW GRAM POSITIVE COCCI RARE GRAM POSITIVE RODS Performed at [facility], [address] [place], [place] [address]    Culture CULTURE REINCUBATED FOR BETTER GROWTH  Final   Report Status PENDING  Incomplete    Best Practice/Protocols:  Prophylaxis with heparin subcutaneous and Pepcid IV  Studies: Ct Head Wo Contrast  Result Date: 05/28/2018 CLINICAL DATA:  Recent cardiac arrest. EXAM: CT HEAD WITHOUT CONTRAST TECHNIQUE: Contiguous axial images were obtained from the base of the skull through the vertex without intravenous contrast. COMPARISON:  Head CT 08/02/2016 FINDINGS: Brain: There is no mass, hemorrhage or  extra-axial collection. The size and configuration of the ventricles and extra-axial CSF spaces are normal. There is hypoattenuation of the white matter, most commonly indicating chronic small vessel disease. Vascular: There is retained intravascular contrast material secondary to earlier CTA of the chest. Skull: The visualized skull base, calvarium and extracranial soft tissues are normal. Sinuses/Orbits: No fluid levels or advanced mucosal thickening of the visualized paranasal sinuses. No mastoid or middle ear effusion. The orbits are normal. IMPRESSION: No acute intracranial abnormality. Electronically Signed   By: Deatra Robinson M.D.   On: 05/28/2018 03:39   Dg Chest Port 1 View  Result Date: 06/02/2018 CLINICAL DATA:  Acute respiratory failure. EXAM: PORTABLE CHEST 1 VIEW COMPARISON:  06/01/2018. FINDINGS: The heart is enlarged. ET tube removed. Unchanged central venous catheter tip distal SVC. Unchanged AICD device. Aortic stent graft. Improved aeration, with clearing of edema and basilar atelectasis. Small LEFT effusion remains. IMPRESSION: Improved aeration. Electronically Signed   By: Elsie Stain M.D.   On: 06/02/2018 07:39   Dg Chest Port 1 View  Result Date: 06/01/2018 CLINICAL DATA:  Respiratory failure. EXAM: PORTABLE CHEST 1 VIEW COMPARISON:  05/31/2018. FINDINGS: Endotracheal tube, NG tube, right IJ line in stable position. AICD in stable position. Aortic stent graft and coils in stable position. Stable cardiomegaly. Increased interstitial markings and bilateral pleural effusions. Findings suggest CHF. Bibasilar pneumonia can not be excluded. No pneumothorax. IMPRESSION: 1.  Lines and tubes in stable position. 2. AICD in stable position. Aortic stent graft and coils in stable position. Stable cardiomegaly. 3. Increased basilar interstitial markings and bilateral pleural effusions. Findings suggest CHF. Bibasilar pneumonia can not be excluded. Electronically Signed   By: Maisie Fus  Register   On:  06/01/2018 05:32   Dg Chest Port 1 View  Result Date: 05/31/2018 CLINICAL DATA:  Respiratory failure. EXAM: PORTABLE CHEST 1 VIEW COMPARISON:  05/31/2002 FINDINGS: The endotracheal tube is 3.8 cm above the carina. The NG tube is coursing down the esophagus and into the stomach. The right IJ catheter is stable. The pacer wires/AICD are stable. Stable thoracic aortic stent graft. The heart is mildly enlarged but stable. Suspect mild interstitial edema in the lungs but no infiltrates or effusions. IMPRESSION: 1. Stable support apparatus without complicating features. 2. Stable cardiac enlargement and probable mild interstitial edema but  no infiltrates or effusions. Electronically Signed   By: Rudie Meyer M.D.   On: 05/31/2018 14:40   Dg Chest Port 1 View  Result Date: 05/31/2018 CLINICAL DATA:  Acute respiratory failure EXAM: PORTABLE CHEST 1 VIEW COMPARISON:  Yesterday FINDINGS: Cardiomegaly with ICD/pacer leads in stable position. Thoracic aortic stent graft. Right IJ line with tip at the SVC. Endotracheal tube with tip between the clavicular heads and carina. The orogastric tube reaches the stomach. Improved aeration and diaphragm visualization on the right. There is still hazy density at the left base at least partially from pleural fluid. No Kerley lines or pneumothorax. IMPRESSION: 1. Stable, unremarkable hardware positioning. 2. Continued retrocardiac opacification from pleural fluid and atelectasis based on admission chest CT. Electronically Signed   By: Marnee Spring M.D.   On: 05/31/2018 06:35   Dg Chest Port 1 View  Result Date: 05/30/2018 CLINICAL DATA:  Acute respiratory failure EXAM: PORTABLE CHEST 1 VIEW COMPARISON:  05/28/2018 FINDINGS: Endotracheal tube, gastric catheter and right-sided jugular central line are again seen and stable. Defibrillator is again noted and stable. Stable cardiomegaly and aortic stent graft are seen. Mild central vascular congestion is again identified stable  from the prior exam. Haziness is noted in the bases bilaterally suggestive of small effusions. No other focal abnormality is seen. IMPRESSION: Overall stable appearance of the chest when compared with the prior exam. Electronically Signed   By: Alcide Clever M.D.   On: 05/30/2018 07:08   Dg Chest Port 1 View  Result Date: 05/28/2018 CLINICAL DATA:  ET tube placement Patient is a 68yo female that is s/p cardiac arrest. Patient was initiated on hypothermia protocol with pharmacy consult to manage electrolytes EXAM: PORTABLE CHEST - 1 VIEW COMPARISON:  Earlier film of the same day FINDINGS: Endotracheal tube, nasogastric tube, right IJ central venous catheter stable in position. Stable left subclavian AICD. Thoracic stent graft and embolization coils appear unchanged. Relatively low lung volumes with some increase in pulmonary vascular congestion and possible mild interstitial edema. Heart size upper limits normal for technique. No effusion. No pneumothorax. Regional bones unremarkable. IMPRESSION: 1. Low volumes with some increase in pulmonary vascular congestion and possible mild interstitial edema. 2. Support hardware stable in position. Electronically Signed   By: Corlis Leak M.D.   On: 05/28/2018 13:51   Dg Chest Port 1 View  Result Date: 05/28/2018 CLINICAL DATA:  Orogastric tube placement, central line placement. EXAM: PORTABLE CHEST 1 VIEW COMPARISON:  Radiographs of May 27, 2018. FINDINGS: Stable cardiomegaly. Status post stent graft repair of descending thoracic aorta. Endotracheal and nasogastric tubes are unchanged in position. Left-sided pacemaker is unchanged in position. No pneumothorax or significant pleural effusion is noted. Minimal left basilar subsegmental atelectasis is noted. Right lung is clear. Bony thorax is unremarkable. IMPRESSION: Minimal left basilar subsegmental atelectasis. Stable support apparatus. Electronically Signed   By: Lupita Raider, M.D.   On: 05/28/2018 08:07   Dg  Chest Portable 1 View  Result Date: 05/27/2018 CLINICAL DATA:  Status post intubation EXAM: PORTABLE CHEST 1 VIEW COMPARISON:  08/02/2016 FINDINGS: Endotracheal tube is noted approximately 1 cm above the carina. It was subsequently withdrawn and reimaged now lying 4 cm above the carina. Changes of prior aortic stent graft are seen and stable. Defibrillator is again noted. Cardiac shadow is again enlarged. The lungs are well aerated. Some patchy infiltrative changes are noted in the upper lobes bilaterally. No bony abnormality is noted. IMPRESSION: Endotracheal tube in satisfactory position. Patchy infiltrates in  the upper lobes bilaterally Postsurgical changes. Electronically Signed   By: Alcide CleverMark  Lukens M.D.   On: 05/27/2018 21:41   Ct Angio Chest/abd/pel For Dissection W And/or Wo Contrast  Result Date: 05/27/2018 CLINICAL DATA:  Cardiac arrest. EXAM: CT ANGIOGRAPHY CHEST, ABDOMEN AND PELVIS TECHNIQUE: Multidetector CT imaging through the chest, abdomen and pelvis was performed using the standard protocol during bolus administration of intravenous contrast. Multiplanar reconstructed images and MIPs were obtained and reviewed to evaluate the vascular anatomy. CONTRAST:  125mL ISOVUE-370 IOPAMIDOL (ISOVUE-370) INJECTION 76% COMPARISON:  Radiograph earlier this day. Chest in abdominal CT dissection protocol 09/19/2009. Report from dissection protocol CT at an outside institution 05/05/2017 FINDINGS: CTA CHEST FINDINGS Cardiovascular: Redemonstration of type B aortic dissection post endovascular stent graft repair. Graft regional nodes at the level of the left subclavian which is occluded as described previously. There is distal reconstitution of the subclavian, not well assessed due to streak artifact from venous contrast in the axilla. Brachiocephalic and left common carotid artery remain patent. Ascending aorta is normal in caliber. Embolization coils the level of the distal arch lies aortic isthmus, as  described on prior. The endovascular stent remains patent. False lumen surrounding the stent is thrombosed which was described previously, residual luminal dimension of 5.0 x 4.2 cm, previously reported 4.8 x 4.2 cm. Immediately distal to the stent graft in the descending thoracic aorta is focal opacification of the false lumen distal to this outpouching the false lumen is thrombosed, which was described previously. Pacemaker in place. Mild cardiomegaly. No central pulmonary embolus. No pericardial effusion. Mediastinum/Nodes: Prominent soft tissue density at the right hilum likely lymph nodes, not well assessed due to adjacent lung consolidation. Otherwise no evidence of adenopathy. Esophagus is decompressed. Endotracheal tube tip above the carina. Lungs/Pleura: Perihilar ground-glass opacities, likely pulmonary edema. Dense consolidation in the dependent right upper lobe with air bronchograms small bilateral pleural effusions with adjacent compressive atelectasis, greater on the right. There is septal thickening the apices in the bases consistent with pulmonary edema. 7 mm right middle lobe pulmonary nodule, unchanged from 2011 and considered benign. Probable debris in the right mainstem bronchus causing luminal narrowing to the lower lobe. No pneumothorax. Musculoskeletal: Buckle fractures of anterior lower ribs, commonly seen with CPR. No sternal fracture. Thoracic spine is intact. Review of the MIP images confirms the above findings. CTA ABDOMEN AND PELVIS FINDINGS VASCULAR Aorta: Chronic dissection, at the diaphragmatic hiatus there is opacification of the false lumen. Dissection extends to the iliac bifurcation. Flow within both the true and false lumen to the iliac bifurcation. Thrombus within the false lumen infrarenal E, as described on prior exam. 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 No periaortic stranding to suggest rupture. Celiac: Patent arising from the true lumen.  No  dissection. SMA: Patent arising from the true lumen.  No dissection. Renals: 2 right renal arteries which received flow from the true lumen. Left renal artery receives flow from both the true and false lumen with early luminal branching. IMA: Patent receiving flow from the true lumen. Inflow: Bilateral common iliac stents are in place and remain patent. Dissection extends into the left common iliac artery along the stent proximally and persisting beyond the stent, as described previously. Dissection flap extends to the proximal-most left internal iliac artery. Normal flow within both internal and external iliac arteries. Veins: No obvious venous abnormality within the limitations of this arterial phase study. Review of the MIP images confirms the above findings. NON-VASCULAR Hepatobiliary:  No focal hepatic abnormality on arterial phase exam. Postcholecystectomy without biliary dilatation. Pancreas: No ductal dilatation or inflammation. Spleen: Normal in size without focal abnormality. Adrenals/Urinary Tract: Normal adrenal glands. Homogeneous enhancement of both kidneys without hydronephrosis. Urinary bladder is decompressed by Foley catheter. Stomach/Bowel: Ingested material in the stomach. No gastric wall thickening. No small bowel obstruction or inflammatory change. Moderate stool burden throughout the entire colon. No colonic wall thickening or inflammation. There is sigmoid colonic tortuosity. No abnormal rectal distention. The appendix is not visualized. Lymphatic: No abdominopelvic adenopathy. Reproductive: Status post hysterectomy. No adnexal masses. Other: No free air free fluid. Musculoskeletal: Degenerative disc disease at L5-S1. There are no acute or suspicious osseous abnormalities. Review of the MIP images confirms the above findings. IMPRESSION: 1. 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 Thoracic dissection extends into the abdominal aorta through the left common iliac artery as before. No evidence of aortic rupture or acute findings. 2. Cardiomegaly with pulmonary edema. Dense consolidation in the right upper lobe with debris in the right mainstem bronchus, highly suggestive of aspiration. Small bilateral pleural effusions with adjacent lower lobe atelectasis. 3. Buckle fractures of bilateral anterior ribs, commonly seen with CPR. 4. No acute findings in the abdomen or pelvis. Large colonic stool burden suggesting constipation. Electronically Signed   By: Narda Rutherford M.D.   On: 05/27/2018 23:05    Consults: Treatment Team:  Thana Farr, MD Laurier Nancy, MD   Subjective:    Overnight Issues: Patient was successfully extubated yesterday.  Doing well this morning, awake alert and communicating with no complaints on nasal cannula oxygen  Objective:  Vital signs for last 24 hours: Temp:  [98.1 F (36.7 C)-100 F (37.8 C)] 98.2 F (36.8 C) (02/01 0600) Pulse Rate:  [60-91] 68 (02/01 0600) Resp:  [11-23] 14 (02/01 0600) BP: (133-180)/(64-107) 179/89 (02/01 0628) SpO2:  [92 %-98 %] 96 % (02/01 0600) FiO2 (%):  [30 %-40 %] 40 % (01/31 1212) Weight:  [106.6 kg] 106.6 kg (02/01 0500)  Hemodynamic parameters for last 24 hours:    Intake/Output from previous day: 01/31 0701 - 02/01 0700 In: 750 [I.V.:30; NG/GT:720] Out: 1915 [Urine:1915]  Intake/Output this shift: No intake/output data recorded.  Physical Exam:  GENERAL:Alert and responsive HEAD: Normocephalic, atraumatic.  EYES: Pupils equal, round, reactive to light.  No scleral icterus.  MOUTH: Moist mucosal membrane NECK: Supple. No thyromegaly. No nodules. No JVD.  PULMONARY: Clear this morning CARDIOVASCULAR: S1 and S2. Regular rate and rhythm. No murmurs, rubs, or gallops.  GASTROINTESTINAL: Soft, nontender, -distended. No masses. Positive bowel sounds. No hepatosplenomegaly.   MUSCULOSKELETAL: No swelling, clubbing, or edema.  NEUROLOGIC: obtunded SKIN:intact,warm,dry  Assessment/Plan:   Status post cardiac arrest.  Patient with witnessed PEA arrest, status post targeted temperature management.  Felt to be primary cardiac event.  Echocardiogram reveals severe LV dysfunction with ejection fraction of 20 to 25%.  Prior history of coronary artery disease.  Hypoxemic respiratory failure.  Patient was successfully extubated yesterday, doing quite well weaned down to nasal cannula with no respiratory distress  Hyperglycemia.  Sliding scale coverage  Prerenal azotemia.  Will follow BUN and creatinine closely  Anemia.  No evidence of active bleeding  Deonte Otting 06/02/2018  *Care during the described time interval was provided by me and/or other providers on the critical care team.  I have reviewed this patient's available data, including medical history,  events of note, physical examination and test results as part of my evaluation. Patient ID: Desiree Merl, female   DOB: 19-Jan-1951, 68 y.o.   MRN: [id number]

## 2018-06-02 NOTE — Progress Notes (Signed)
Bedside swallow eval completed. Report to follow this affternoon. Pt tolerated thin lquids of single sips, puree and very soft solids. Dys 2 diet with thin liquids ordered. ST to f/u this afternoon with diet toleration.

## 2018-06-03 LAB — CBC WITH DIFFERENTIAL/PLATELET
Abs Immature Granulocytes: 0.09 10*3/uL — ABNORMAL HIGH (ref 0.00–0.07)
BASOS PCT: 0 %
Basophils Absolute: 0 10*3/uL (ref 0.0–0.1)
Eosinophils Absolute: 0 10*3/uL (ref 0.0–0.5)
Eosinophils Relative: 0 %
HCT: 36 % (ref 36.0–46.0)
Hemoglobin: 10.9 g/dL — ABNORMAL LOW (ref 12.0–15.0)
Immature Granulocytes: 1 %
Lymphocytes Relative: 6 %
Lymphs Abs: 0.7 10*3/uL (ref 0.7–4.0)
MCH: 26.9 pg (ref 26.0–34.0)
MCHC: 30.3 g/dL (ref 30.0–36.0)
MCV: 88.9 fL (ref 80.0–100.0)
Monocytes Absolute: 0.6 10*3/uL (ref 0.1–1.0)
Monocytes Relative: 5 %
NRBC: 0 % (ref 0.0–0.2)
Neutro Abs: 10.1 10*3/uL — ABNORMAL HIGH (ref 1.7–7.7)
Neutrophils Relative %: 88 %
PLATELETS: 235 10*3/uL (ref 150–400)
RBC: 4.05 MIL/uL (ref 3.87–5.11)
RDW: 16.2 % — ABNORMAL HIGH (ref 11.5–15.5)
WBC: 11.4 10*3/uL — ABNORMAL HIGH (ref 4.0–10.5)

## 2018-06-03 LAB — BASIC METABOLIC PANEL
Anion gap: 4 — ABNORMAL LOW (ref 5–15)
BUN: 30 mg/dL — ABNORMAL HIGH (ref 8–23)
CO2: 38 mmol/L — ABNORMAL HIGH (ref 22–32)
Calcium: 9.2 mg/dL (ref 8.9–10.3)
Chloride: 99 mmol/L (ref 98–111)
Creatinine, Ser: 0.52 mg/dL (ref 0.44–1.00)
Glucose, Bld: 179 mg/dL — ABNORMAL HIGH (ref 70–99)
Potassium: 3.9 mmol/L (ref 3.5–5.1)
SODIUM: 141 mmol/L (ref 135–145)

## 2018-06-03 LAB — HEMOGLOBIN A1C
Hgb A1c MFr Bld: 7 % — ABNORMAL HIGH (ref 4.8–5.6)
MEAN PLASMA GLUCOSE: 154.2 mg/dL

## 2018-06-03 LAB — GLUCOSE, CAPILLARY
Glucose-Capillary: 172 mg/dL — ABNORMAL HIGH (ref 70–99)
Glucose-Capillary: 192 mg/dL — ABNORMAL HIGH (ref 70–99)
Glucose-Capillary: 198 mg/dL — ABNORMAL HIGH (ref 70–99)
Glucose-Capillary: 221 mg/dL — ABNORMAL HIGH (ref 70–99)
Glucose-Capillary: 208 mg/dL — ABNORMAL HIGH (ref 70–99)

## 2018-06-03 LAB — CULTURE, RESPIRATORY W GRAM STAIN

## 2018-06-03 LAB — PHOSPHORUS: Phosphorus: 4.4 mg/dL (ref 2.5–4.6)

## 2018-06-03 LAB — MAGNESIUM: Magnesium: 2.1 mg/dL (ref 1.7–2.4)

## 2018-06-03 MED ORDER — ALBUTEROL SULFATE (2.5 MG/3ML) 0.083% IN NEBU: 2.5000 mg | INHALATION_SOLUTION | RESPIRATORY_TRACT | Status: DC | PRN

## 2018-06-03 MED ORDER — CARVEDILOL 3.125 MG PO TABS
3.1250 mg | ORAL_TABLET | Freq: Two times a day (BID) | ORAL | Status: DC
  Administered 2018-06-03: 3.125 mg via ORAL
  Filled 2018-06-03 (×2): qty 1

## 2018-06-03 MED ORDER — INSULIN ASPART 100 UNIT/ML ~~LOC~~ SOLN
0.0000 [IU] | Freq: Three times a day (TID) | SUBCUTANEOUS | Status: DC
  Administered 2018-06-03: 5 [IU] via SUBCUTANEOUS
  Administered 2018-06-04 – 2018-06-05 (×4): 3 [IU] via SUBCUTANEOUS
  Administered 2018-06-05: 5 [IU] via SUBCUTANEOUS
  Filled 2018-06-03 (×6): qty 1

## 2018-06-03 MED ORDER — WARFARIN SODIUM 5 MG PO TABS
5.0000 mg | ORAL_TABLET | Freq: Once | ORAL | Status: AC
  Administered 2018-06-03: 5 mg via ORAL
  Filled 2018-06-03: qty 1

## 2018-06-03 MED ORDER — IPRATROPIUM-ALBUTEROL 0.5-2.5 (3) MG/3ML IN SOLN
3.0000 mL | Freq: Three times a day (TID) | RESPIRATORY_TRACT | Status: DC
  Administered 2018-06-03 – 2018-06-05 (×7): 3 mL via RESPIRATORY_TRACT
  Filled 2018-06-03 (×8): qty 3

## 2018-06-03 MED ORDER — METHYLPREDNISOLONE SODIUM SUCC 40 MG IJ SOLR
40.0000 mg | Freq: Two times a day (BID) | INTRAMUSCULAR | Status: AC
  Administered 2018-06-03: 40 mg via INTRAVENOUS
  Filled 2018-06-03: qty 1

## 2018-06-03 MED ORDER — INSULIN ASPART 100 UNIT/ML ~~LOC~~ SOLN
0.0000 [IU] | Freq: Every day | SUBCUTANEOUS | Status: DC
  Administered 2018-06-03 – 2018-06-04 (×2): 2 [IU] via SUBCUTANEOUS
  Filled 2018-06-03 (×2): qty 1

## 2018-06-03 MED ORDER — CLOPIDOGREL BISULFATE 75 MG PO TABS
75.0000 mg | ORAL_TABLET | Freq: Every morning | ORAL | Status: DC
  Administered 2018-06-03 – 2018-06-05 (×3): 75 mg via ORAL
  Filled 2018-06-03 (×3): qty 1

## 2018-06-03 MED ORDER — WARFARIN - PHARMACIST DOSING INPATIENT: Freq: Every day | Status: DC

## 2018-06-03 MED ORDER — MONTELUKAST SODIUM 10 MG PO TABS
10.0000 mg | ORAL_TABLET | Freq: Every day | ORAL | Status: DC
  Administered 2018-06-03 – 2018-06-05 (×3): 10 mg via ORAL
  Filled 2018-06-03 (×3): qty 1

## 2018-06-03 MED ORDER — SPIRONOLACTONE 25 MG PO TABS
25.0000 mg | ORAL_TABLET | Freq: Every day | ORAL | Status: DC
  Administered 2018-06-03 – 2018-06-05 (×3): 25 mg via ORAL
  Filled 2018-06-03 (×3): qty 1

## 2018-06-03 NOTE — Progress Notes (Signed)
Patient worked with PT today.  She was very dizzy and weak.  After PT worked with her, patient reported being exhausted.    Christean GriefShylon M Lavene Penagos, RN

## 2018-06-03 NOTE — Evaluation (Signed)
Physical Therapy Evaluation Patient Details Name: Desiree Mccall MRN: [id number] DOB: [birth year] Today's Date: 06/03/2018   History of Present Illness  Patient is a is a 68 y.o. female with a history of aortic dissection, CHF, CAD,  hypertension, diabetes, heart attack, A. fib and V. Fib, s/p AICD.  She was admitted 1/26 for cardiac arrest, cardiogenic shock, acute respiratory failure with hypoxia due to acute on chronic systolic CHF with ejection fraction 20 to 25%.  She was intubated and extubated.  Moved to floor from CCU today with oxygen by nasal cannula 4 L now. Reports she lives at home with 2 sons, is on disability, and 1 son is availible 24/7 at home  Clinical Impression  Patient with deficits in BLE and trunk strength, balance, activity tolerance, and decreased endurance. Patient currently unable to perform transfer activities safely and ind, and is unable to demonstrate ambulation d/t safety concerns; inhibiting patients full participation in ADLs and normal errand running activities. Patient demonstrates fatigue at the end of the session with dizziness following each positional change. Patient is currently unable to remain standing without support, or take steps. PT will continue to encourage this. PT educated patient on continuing heel slides and SLR in bed; verbalized understanding. Would benefit from skilled PT to address above deficits and promote optimal return to PLOF     Follow Up Recommendations SNF    Equipment Recommendations  Rolling walker with 5" wheels    Recommendations for Other Services       Precautions / Restrictions Precautions Precautions: Fall Restrictions Weight Bearing Restrictions: No      Mobility  Bed Mobility Overal bed mobility: Needs Assistance Bed Mobility: Sidelying to Sit;Supine to Sit;Sit to Supine   Sidelying to sit: Min assist Supine to sit: Min assist Sit to supine: Min assist   General bed mobility comments: Patient able to utilize  handrails of bed to and minA from PT to complete supine > EOB sitting. Became dizzy following, subsided <46mins, vitals stable  Transfers Overall transfer level: Needs assistance Equipment used: Rolling walker (2 wheeled) Transfers: Sit to/from Stand Sit to Stand: Mod assist;From elevated surface         General transfer comment: Patient able to initiate and complete with PT assistance, unable to remain standing without support. Following standing, patient is dizzy again, subsides >5, vitals stable, and reports naseua.   Ambulation/Gait             General Gait Details: Not attempted d/t patient becoming naseuas and orthostatic with all tasks following 3 day hospitalization in ICU  Stairs            Wheelchair Mobility    Modified Rankin (Stroke Patients Only)       Balance Overall balance assessment: Needs assistance Sitting-balance support: Feet unsupported;Bilateral upper extremity supported Sitting balance-Leahy Scale: Poor     Standing balance support: Bilateral upper extremity supported Standing balance-Leahy Scale: Poor Standing balance comment: Patient unable to maintain standing balance with RW without bed supporting back of legs and min-modA from PT                             Pertinent Vitals/Pain Pain Assessment: No/denies pain    Home Living Family/patient expects to be discharged to:: Private residence Living Arrangements: Children Available Help at Discharge: Family Type of Home: House Home Access: Stairs to enter Entrance Stairs-Rails: None Entrance Stairs-Number of Steps: 3 Home Layout: One level Home  Equipment: None Additional Comments: Reports she ambulated ind without AD prior, drives, runs errands    Prior Function Level of Independence: Independent               Hand Dominance   Dominant Hand: Right    Extremity/Trunk Assessment   Upper Extremity Assessment Upper Extremity Assessment: Overall WFL for tasks  assessed    Lower Extremity Assessment Lower Extremity Assessment: Overall WFL for tasks assessed    Cervical / Trunk Assessment Cervical / Trunk Assessment: Normal  Communication   Communication: No difficulties  Cognition Arousal/Alertness: Lethargic Behavior During Therapy: WFL for tasks assessed/performed Overall Cognitive Status: Difficult to assess                                        General Comments      Exercises Other Exercises Other Exercises: Led patient through technique for supine > sit EOB transfer, which patient is able to complete with physical assistance from PT. Led patient through STS transfer, requiring max cuing initially for proper handplacement and set up, patient able to initiate transfer, but unable to complete without assistance. Patient is unable to remain standing with RW and PT away from bed, but is able to comply with cuing to "stand up tall" and look straight ahead. Patient with dizziness following all positional changes and nausea at end of session. Vitals monitored throughtout and stable.    Assessment/Plan    PT Assessment Patient needs continued PT services  PT Problem List Decreased strength;Decreased coordination;Decreased range of motion;Decreased activity tolerance;Decreased balance;Decreased safety awareness;Decreased mobility       PT Treatment Interventions DME instruction;Gait training;Therapeutic exercise;Balance training;Stair training;Neuromuscular re-education;Functional mobility training;Therapeutic activities;Patient/family education    PT Goals (Current goals can be found in the Care Plan section)  Acute Rehab PT Goals Patient Stated Goal: walk independently PT Goal Formulation: With patient Time For Goal Achievement: 06/17/18 Potential to Achieve Goals: Fair    Frequency Min 2X/week   Barriers to discharge        Co-evaluation               AM-PAC PT "6 Clicks" Mobility  Outcome Measure Help  needed turning from your back to your side while in a flat bed without using bedrails?: A Little Help needed moving from lying on your back to sitting on the side of a flat bed without using bedrails?: A Lot Help needed moving to and from a bed to a chair (including a wheelchair)?: A Lot Help needed standing up from a chair using your arms (e.g., wheelchair or bedside chair)?: A Lot Help needed to walk in hospital room?: Total Help needed climbing 3-5 steps with a railing? : Total 6 Click Score: 11    End of Session Equipment Utilized During Treatment: Gait belt Activity Tolerance: Patient tolerated treatment well Patient left: in bed;with call bell/phone within reach;with bed alarm set Nurse Communication: Mobility status PT Visit Diagnosis: Unsteadiness on feet (R26.81);Other abnormalities of gait and mobility (R26.89);Difficulty in walking, not elsewhere classified (R26.2);Dizziness and giddiness (R42);Repeated falls (R29.6);Muscle weakness (generalized) (M62.81);History of falling (Z91.81)    Time: 8828-0034 PT Time Calculation (min) (ACUTE ONLY): 27 min   Charges:   PT Evaluation $PT Eval Moderate Complexity: 1 Mod PT Treatments $Therapeutic Activity: 8-22 mins        Staci Acosta PT, DPT  Staci Acosta 06/03/2018, 4:52 PM

## 2018-06-03 NOTE — Progress Notes (Signed)
[employer] [facility] - [place] [facility]   PATIENT NAME: Desiree Mccall    MR#:  [id number]  DATE OF BIRTH:  [birth year]  SUBJECTIVE:  CHIEF COMPLAINT:   Chief Complaint  Patient presents with  . Cardiac Arrest   Generalized weakness.  On oxygen by nasal cannula 4 L. REVIEW OF SYSTEMS:  Review of Systems  Constitutional: Positive for malaise/fatigue. Negative for chills and fever.  HENT: Negative for sore throat.   Eyes: Negative for blurred vision and double vision.  Respiratory: Negative for cough, hemoptysis, shortness of breath, wheezing and stridor.   Cardiovascular: Negative for chest pain, palpitations, orthopnea and leg swelling.  Gastrointestinal: Negative for abdominal pain, blood in stool, diarrhea, melena, nausea and vomiting.  Genitourinary: Negative for dysuria, flank pain and hematuria.  Musculoskeletal: Negative for back pain and joint pain.  Skin: Negative for rash.  Neurological: Negative for dizziness, sensory change, focal weakness, seizures, loss of consciousness, weakness and headaches.  Endo/Heme/Allergies: Negative for polydipsia.  Psychiatric/Behavioral: Negative for depression. The patient is not nervous/anxious.     DRUG ALLERGIES:  [name] VITALS:  Blood pressure (!) 158/68, pulse 73, temperature (!) 97.5 F (36.4 C), temperature source Bladder, resp. rate 14, height 5\' 8"  (1.727 m), weight 105.8 kg, SpO2 92 %. PHYSICAL EXAMINATION:  Physical Exam Constitutional:      General: She is not in acute distress.    Appearance: Normal appearance.     Comments: Intubated. Morbid obesity.  HENT:     Head: Normocephalic.     Mouth/Throat:     Mouth: Mucous membranes are dry.  Eyes:     General: No scleral icterus.    Conjunctiva/sclera: Conjunctivae normal.     Pupils: Pupils are equal, round, and reactive to light.  Neck:     Musculoskeletal: Normal range of motion and neck supple.     Vascular: No JVD.     Trachea: No  tracheal deviation.  Cardiovascular:     Rate and Rhythm: Normal rate and regular rhythm.     Heart sounds: Normal heart sounds. No murmur. No gallop.   Pulmonary:     Effort: Pulmonary effort is normal. No respiratory distress.     Breath sounds: Normal breath sounds. No stridor. No wheezing, rhonchi or rales.  Abdominal:     General: Bowel sounds are normal. There is no distension.     Palpations: Abdomen is soft.     Tenderness: There is no abdominal tenderness. There is no guarding or rebound.     Comments: Diminished bowel sound.  Musculoskeletal: Normal range of motion.        General: No tenderness.     Right lower leg: No edema.     Left lower leg: No edema.  Skin:    Findings: No erythema or rash.  Neurological:     General: No focal deficit present.     Mental Status: She is alert and oriented to person, place, and time.     Cranial Nerves: No cranial nerve deficit.     Sensory: No sensory deficit.  Psychiatric:        Mood and Affect: Mood normal.    LABORATORY PANEL:  Female CBC Recent Labs  Lab 06/03/18 0501  WBC 11.4*  HGB 10.9*  HCT 36.0  PLT 235   ------------------------------------------------------------------------------------------------------------------ Chemistries  Recent Labs  Lab 05/27/18 2120  06/03/18 0501  NA 142   < > 141  K 4.0   < > 3.9  CL 106   < > 99  CO2 20*   < > 38*  GLUCOSE 343*   < > 179*  BUN 18   < > 30*  CREATININE 0.96   < > 0.52  CALCIUM 8.6*   < > 9.2  MG  --    < > 2.1  AST 229*  --   --   ALT 135*  --   --   ALKPHOS 155*  --   --   BILITOT 0.7  --   --    < > = values in this interval not displayed.   RADIOLOGY:  No results found. ASSESSMENT AND PLAN:  Desiree Mccall  is a 68 y.o. female with PMH Aortic dissection, CHF, DM and heart attack.   Cardiac arrest [facility]) -this was PEA arrest per EMS report, she received 1 round of epinephrine and CPR and obtained ROSC.  She was intubated here in the ED post arrest.    Cardiogenic shock. Off levophed drip.  Acute on chronic systolic CHF EF 20 to 25%. Continue iv lasix.  Repeated chest x-ray: Increased basilar interstitial markings and bilateral pleural effusions. Findings suggest CHF. Bibasilar pneumonia can not be excluded. Clinically no evidence of pneumonia.  Continue Lasix, resume spironolactone, start low-dose Coreg.  I will defer to cardiologist to start [facility].  History of fib A. fib and V. fib, status post AICD. Resume Coumadin pharmacy to dose.  Acute respiratory failure with hypoxia due to above. The patient was successfully extubated yesterday.  On oxygen by nasal cannula.  Continue nebulizer.  Taper IV Solu-Medrol.  Hypokalemia.  Improved with KCl supplement.    CAD (coronary artery disease) -resume Plavix.    Diabetes mellitus type 2, noninsulin dependent (HCC) -sliding scale insulin coverage and Lantus 5 unit daily for now.  Hypertension.  Resume spironolactone, start Coreg, continue Lasix.  IV hydralazine PRN.  Dehydration due to diuretics.  Follow-up BMP. Tobacco abuse.  Smoking cessation was counseled for 3 to 4 minutes.  The patient wants to quit. Generalized weakness.  Ambulate patient. All the records are reviewed and case discussed with Care Management/Social Worker. Management plans discussed with the patient's daughter and they are in agreement.  CODE STATUS: Full Code  TOTAL TIME TAKING CARE OF THIS PATIENT: 32 minutes.   More than 50% of the time was spent in counseling/coordination of care: YES  POSSIBLE D/C IN 2-3 DAYS, DEPENDING ON CLINICAL CONDITION.   Shaune Pollack M.D on 06/03/2018 at 10:36 AM  Between 7am to 6pm - Pager - [phone]  After 6pm go to [email or web address] - Therapist, nutritional Hospitalists

## 2018-06-03 NOTE — Consult Note (Signed)
ANTICOAGULATION CONSULT NOTE - Initial Consult  Pharmacy Consult for Warfarin Indication: atrial fibrillation  [name]  Patient Measurements: Height: 5\' 8"  (172.7 cm) Weight: 233 lb 4 oz (105.8 kg) [name] (Calculated) : 63.9   Vital Signs: Temp: 97.5 F (36.4 C) (02/02 0700) Temp Source: Bladder (02/02 0700) BP: 158/68 (02/02 0800) Pulse Rate: 73 (02/02 0800)  Labs: Recent Labs    06/01/18 0413 06/02/18 0428 06/03/18 0501  HGB 10.2* 10.7* 10.9*  HCT 33.2* 35.6* 36.0  PLT 169 201 235  CREATININE 0.55 0.50 0.52    Estimated Creatinine Clearance: 89.3 mL/min (by C-G formula based on SCr of 0.52 mg/dL).   Medical History: Past Medical History:  Diagnosis Date  . Aortic dissection (HCC) 2010  . CHF (congestive heart failure) (HCC)   . Diabetes mellitus without complication (HCC)   . [name]   . Heart attack (HCC) sept. 25, 2010     Assessment: 68 yo female admitted with cardiac arrest. Outpatient medications include warfarin 7.5 mg every day, except Tuesday takes 3.75 mg (one-half tablet). Patient has not received a dose since before admission on 1/26, has been receiving prophylaxis with SQ heparin. Last [name] was 1/27 2.39.  Goal of Therapy:  [name] 2-3 Monitor platelets by anticoagulation protocol: Yes   Plan:  H&H stable. Ordered Warfarin 5 mg dose x 1. Will continue SQ heparin 5000 units SQ until [name] therapeutic. Will order CBC/[name] daily. Pharmacy will continue to monitor.   Mauri ReadingSavanna M Martin, PharmD Pharmacy Resident  06/03/2018 11:11 AM

## 2018-06-03 NOTE — Progress Notes (Signed)
Vitals were measured at 13:00 and within normal limits.  Patient reported feeling weak, RN measured her BP again and it was 165/71.  Problem with [employer]; they are not sending the information over.  Her readings from earlier were lost.  RN used 2 different machines, both malfunctioned, so will type in the BP manually.  Christean GriefShylon M Trentin Knappenberger, RN

## 2018-06-03 NOTE — Progress Notes (Signed)
Follow up - Critical Care Medicine Note  Patient Details:    Desiree Mccall is an 68 y.o. female.68 y.o. who Patient presents to the ED status post cardiac arrest.  She was at her home when she became acutely short of breath.  She called EMS and when they arrived she very quickly became unresponsive and turned "purple".  Per EMS she was PEA arrest, and received 1 round of epinephrine and CPR and then obtained ROSC.    Patient was admitted to the intensive care unit for TTM. She is now status post targeted temperature management, extubated and doing well.  Lines, Airways, Drains: Airway 7.5 mm (Active)  Secured at (cm) 23 cm 06/01/2018  7:19 AM  Measured From Lips 06/01/2018  7:19 AM  Secured Location Center 06/01/2018  7:19 AM  Secured By [employer] 06/01/2018  7:19 AM  Tube Holder Repositioned Yes 06/01/2018  3:16 AM  Cuff Pressure (cm H2O) 30 cm H2O 06/01/2018  3:16 AM  Site Condition Dry 06/01/2018  7:19 AM  Date Prophylactic Dressing Applied (if applicable) 05/27/18 05/31/2018  8:00 AM     CVC Triple Lumen 05/28/18 Right Internal jugular (Active)  Indication for Insertion or Continuance of Line Vasoactive infusions 05/31/2018  8:00 PM  Site Assessment Clean;Dry;Intact 05/31/2018  8:00 PM  Proximal Lumen Status Saline locked;Flushed;Blood return noted 05/31/2018  8:00 PM  Medial Lumen Status Flushed;Blood return noted 05/31/2018  8:00 PM  Distal Lumen Status Flushed;Blood return noted 05/31/2018  8:00 PM  Dressing Type Transparent 05/31/2018  8:00 PM  Dressing Status Intact;Antimicrobial disc in place 05/31/2018  8:00 PM  Line Care Connections checked and tightened 05/31/2018  8:00 PM  Dressing Intervention Dressing changed;Antimicrobial disc changed 05/28/2018  9:00 PM  Dressing Change Due 06/04/18 05/31/2018  8:00 PM     Arterial Line 05/28/18 Right Femoral (Active)  Site Assessment Clean;Dry;Intact 05/31/2018  8:00 PM  Line Status Pulsatile blood flow 05/31/2018  8:00 PM  Art Line  Waveform Appropriate 05/31/2018  8:00 PM  Art Line Interventions Connections checked and tightened 05/31/2018  8:00 PM  Dressing Type Transparent 05/31/2018  8:00 PM  Dressing Status Clean;Dry;Intact 05/31/2018  8:00 PM  Interventions New dressing 05/28/2018  6:55 AM  Dressing Change Due 06/04/18 05/31/2018  8:00 PM     NG/OG Tube Orogastric 18 Fr. Center mouth Documented cm marking at nare/ corner of mouth 64 cm (Active)  Cm Marking at Nare/Corner of Mouth (if applicable) 64 cm 06/01/2018  4:00 AM  Site Assessment Clean;Dry;Intact 06/01/2018  4:00 AM  Ongoing Placement Verification No change in cm markings or external length of tube from initial placement;No change in respiratory status;No acute changes, not attributed to clinical condition;Xray 06/01/2018  4:00 AM  Status Infusing tube feed 06/01/2018  4:00 AM  Drainage Appearance Straw 05/28/2018  5:30 AM     Urethral Catheter Brittney Sparks NTII Temperature probe 16 Fr. (Active)  Indication for Insertion or Continuance of Catheter Unstable critical patients (first 24-48 hours) 06/01/2018  4:00 AM  Site Assessment Clean;Intact 06/01/2018  4:00 AM  Catheter Maintenance Bag below level of bladder;Catheter secured;Drainage bag/tubing not touching floor;Insertion date on drainage bag;No dependent loops;Seal intact 06/01/2018  4:00 AM  Collection Container Standard drainage bag 06/01/2018  4:00 AM  Securement Method Securing device (Describe) 06/01/2018  4:00 AM  Urinary Catheter Interventions Unclamped 06/01/2018  4:00 AM  Output (mL) 60 mL 06/01/2018  6:00 AM    Anti-infectives:  Anti-infectives (From admission, onward)   Start  Dose/Rate Route Frequency Ordered Stop   05/28/18 0600  piperacillin-tazobactam (ZOSYN) IVPB 3.375 g  Status:  Discontinued     3.375 g 12.5 mL/hr over 240 Minutes Intravenous Every 8 hours 05/28/18 0446 05/28/18 0815   05/27/18 2200  ceFEPIme (MAXIPIME) 1 g in sodium chloride 0.9 % 100 mL IVPB     1 g 200 mL/hr over 30  Minutes Intravenous  Once 05/27/18 2145 05/27/18 2340   05/27/18 2200  vancomycin (VANCOCIN) IVPB 1000 mg/200 mL premix     1,000 mg 200 mL/hr over 60 Minutes Intravenous  Once 05/27/18 2145 05/28/18 0050      Microbiology: Results for orders placed or performed during the hospital encounter of 05/27/18  Urine Culture     Status: None   Collection Time: 05/27/18  9:20 PM  Result Value Ref Range Status   Specimen Description   Final    URINE, RANDOM Performed at [facility], [address] [place], [place] [address]    Special Requests   Final    NONE Performed at [facility], [address] [place], [place] [address]    Culture   Final    NO GROWTH Performed at [facility], [address] [place], [place] [address]    Report Status 05/29/2018 FINAL  Final  Blood culture (routine x 2)     Status: None   Collection Time: 05/27/18  9:50 PM  Result Value Ref Range Status   Specimen Description BLOOD RIGHT HAND  Final   Special Requests Blood Culture adequate volume  Final   Culture   Final    NO GROWTH 5 DAYS Performed at [facility], [address] [place], [place] [address]    Report Status 06/01/2018 FINAL  Final  Blood culture (routine x 2)     Status: None   Collection Time: 05/27/18 10:57 PM  Result Value Ref Range Status   Specimen Description BLOOD LEFT ANTECUBITAL  Final   Special Requests Blood Culture adequate volume  Final   Culture   Final    NO GROWTH 5 DAYS Performed at [facility], [address] [place], [place] [address]    Report Status 06/01/2018 FINAL  Final  MRSA PCR Screening     Status: None   Collection Time: 05/28/18  2:24 AM  Result Value Ref Range Status   MRSA by PCR NEGATIVE NEGATIVE Final    Comment:        The GeneXpert MRSA Assay (FDA approved for NASAL specimens only), is one component of a comprehensive MRSA colonization surveillance program. It is not intended to diagnose  MRSA infection nor to guide or monitor treatment for MRSA infections. Performed at [facility], [address] [place], [place] [address]   Culture, respiratory (non-expectorated)     Status: None   Collection Time: 05/28/18  4:29 AM  Result Value Ref Range Status   Specimen Description   Final    TRACHEAL ASPIRATE Performed at [facility], [address] [place], [place] [address]    Special Requests   Final    NONE Performed at [facility], [address] [place], [place] [address]    Gram Stain   Final    ABUNDANT WBC PRESENT,BOTH PMN AND MONONUCLEAR FEW GRAM POSITIVE COCCI FEW GRAM POSITIVE RODS RARE GRAM NEGATIVE RODS    Culture   Final    FEW Consistent with normal respiratory flora. Performed at [facility], [address], [place], [place] [address]    Report Status 05/30/2018 FINAL  Final  Culture, respiratory (non-expectorated)     Status: None (Preliminary result)   Collection Time: 05/31/18  3:32 PM  Result Value Ref Range Status   Specimen Description   Final    TRACHEAL ASPIRATE Performed at [facility], [address] [place], [place] [address]    Special Requests   Final    NONE Performed at [facility], [address] [place], [place] [address]    Gram Stain   Final    MODERATE WBC PRESENT, PREDOMINANTLY PMN FEW GRAM POSITIVE COCCI RARE GRAM POSITIVE RODS Performed at [facility], [address], [place], [place] [address]    Culture CULTURE REINCUBATED FOR BETTER GROWTH  Final   Report Status PENDING  Incomplete    Best Practice/Protocols:  Prophylaxis with heparin subcutaneous and Pepcid IV  Studies: Ct Head Wo Contrast  Result Date: 05/28/2018 CLINICAL DATA:  Recent cardiac arrest. EXAM: CT HEAD WITHOUT CONTRAST TECHNIQUE: Contiguous axial images were obtained from the base of the skull through the vertex without intravenous contrast. COMPARISON:  Head CT 08/02/2016  FINDINGS: Brain: There is no mass, hemorrhage or extra-axial collection. The size and configuration of the ventricles and extra-axial CSF spaces are normal. There is hypoattenuation of the white matter, most commonly indicating chronic small vessel disease. Vascular: There is retained intravascular contrast material secondary to earlier CTA of the chest. Skull: The visualized skull base, calvarium and extracranial soft tissues are normal. Sinuses/Orbits: No fluid levels or advanced mucosal thickening of the visualized paranasal sinuses. No mastoid or middle ear effusion. The orbits are normal. IMPRESSION: No acute intracranial abnormality. Electronically Signed   By: Deatra Robinson M.D.   On: 05/28/2018 03:39   Dg Chest Port 1 View  Result Date: 06/02/2018 CLINICAL DATA:  Acute respiratory failure. EXAM: PORTABLE CHEST 1 VIEW COMPARISON:  06/01/2018. FINDINGS: The heart is enlarged. ET tube removed. Unchanged central venous catheter tip distal SVC. Unchanged AICD device. Aortic stent graft. Improved aeration, with clearing of edema and basilar atelectasis. Small LEFT effusion remains. IMPRESSION: Improved aeration. Electronically Signed   By: Elsie Stain M.D.   On: 06/02/2018 07:39   Dg Chest Port 1 View  Result Date: 06/01/2018 CLINICAL DATA:  Respiratory failure. EXAM: PORTABLE CHEST 1 VIEW COMPARISON:  05/31/2018. FINDINGS: Endotracheal tube, NG tube, right IJ line in stable position. AICD in stable position. Aortic stent graft and coils in stable position. Stable cardiomegaly. Increased interstitial markings and bilateral pleural effusions. Findings suggest CHF. Bibasilar pneumonia can not be excluded. No pneumothorax. IMPRESSION: 1.  Lines and tubes in stable position. 2. AICD in stable position. Aortic stent graft and coils in stable position. Stable cardiomegaly. 3. Increased basilar interstitial markings and bilateral pleural effusions. Findings suggest CHF. Bibasilar pneumonia can not be excluded.  Electronically Signed   By: Maisie Fus  Register   On: 06/01/2018 05:32   Dg Chest Port 1 View  Result Date: 05/31/2018 CLINICAL DATA:  Respiratory failure. EXAM: PORTABLE CHEST 1 VIEW COMPARISON:  05/31/2002 FINDINGS: The endotracheal tube is 3.8 cm above the carina. The NG tube is coursing down the esophagus and into the stomach. The right IJ catheter is stable. The pacer wires/AICD are stable. Stable thoracic aortic stent graft. The heart is mildly enlarged but stable. Suspect mild interstitial edema in the lungs but no infiltrates or effusions. IMPRESSION: 1. Stable support apparatus without complicating features. 2. Stable cardiac  enlargement and probable mild interstitial edema but no infiltrates or effusions. Electronically Signed   By: Rudie Meyer M.D.   On: 05/31/2018 14:40   Dg Chest Port 1 View  Result Date: 05/31/2018 CLINICAL DATA:  Acute respiratory failure EXAM: PORTABLE CHEST 1 VIEW COMPARISON:  Yesterday FINDINGS: Cardiomegaly with ICD/pacer leads in stable position. Thoracic aortic stent graft. Right IJ line with tip at the SVC. Endotracheal tube with tip between the clavicular heads and carina. The orogastric tube reaches the stomach. Improved aeration and diaphragm visualization on the right. There is still hazy density at the left base at least partially from pleural fluid. No Kerley lines or pneumothorax. IMPRESSION: 1. Stable, unremarkable hardware positioning. 2. Continued retrocardiac opacification from pleural fluid and atelectasis based on admission chest CT. Electronically Signed   By: Marnee Spring M.D.   On: 05/31/2018 06:35   Dg Chest Port 1 View  Result Date: 05/30/2018 CLINICAL DATA:  Acute respiratory failure EXAM: PORTABLE CHEST 1 VIEW COMPARISON:  05/28/2018 FINDINGS: Endotracheal tube, gastric catheter and right-sided jugular central line are again seen and stable. Defibrillator is again noted and stable. Stable cardiomegaly and aortic stent graft are seen. Mild  central vascular congestion is again identified stable from the prior exam. Haziness is noted in the bases bilaterally suggestive of small effusions. No other focal abnormality is seen. IMPRESSION: Overall stable appearance of the chest when compared with the prior exam. Electronically Signed   By: Alcide Clever M.D.   On: 05/30/2018 07:08   Dg Chest Port 1 View  Result Date: 05/28/2018 CLINICAL DATA:  ET tube placement Patient is a 68yo female that is s/p cardiac arrest. Patient was initiated on hypothermia protocol with pharmacy consult to manage electrolytes EXAM: PORTABLE CHEST - 1 VIEW COMPARISON:  Earlier film of the same day FINDINGS: Endotracheal tube, nasogastric tube, right IJ central venous catheter stable in position. Stable left subclavian AICD. Thoracic stent graft and embolization coils appear unchanged. Relatively low lung volumes with some increase in pulmonary vascular congestion and possible mild interstitial edema. Heart size upper limits normal for technique. No effusion. No pneumothorax. Regional bones unremarkable. IMPRESSION: 1. Low volumes with some increase in pulmonary vascular congestion and possible mild interstitial edema. 2. Support hardware stable in position. Electronically Signed   By: Corlis Leak M.D.   On: 05/28/2018 13:51   Dg Chest Port 1 View  Result Date: 05/28/2018 CLINICAL DATA:  Orogastric tube placement, central line placement. EXAM: PORTABLE CHEST 1 VIEW COMPARISON:  Radiographs of May 27, 2018. FINDINGS: Stable cardiomegaly. Status post stent graft repair of descending thoracic aorta. Endotracheal and nasogastric tubes are unchanged in position. Left-sided pacemaker is unchanged in position. No pneumothorax or significant pleural effusion is noted. Minimal left basilar subsegmental atelectasis is noted. Right lung is clear. Bony thorax is unremarkable. IMPRESSION: Minimal left basilar subsegmental atelectasis. Stable support apparatus. Electronically Signed    By: Lupita Raider, M.D.   On: 05/28/2018 08:07   Dg Chest Portable 1 View  Result Date: 05/27/2018 CLINICAL DATA:  Status post intubation EXAM: PORTABLE CHEST 1 VIEW COMPARISON:  08/02/2016 FINDINGS: Endotracheal tube is noted approximately 1 cm above the carina. It was subsequently withdrawn and reimaged now lying 4 cm above the carina. Changes of prior aortic stent graft are seen and stable. Defibrillator is again noted. Cardiac shadow is again enlarged. The lungs are well aerated. Some patchy infiltrative changes are noted in the upper lobes bilaterally. No bony abnormality is noted. IMPRESSION: Endotracheal  tube in satisfactory position. Patchy infiltrates in the upper lobes bilaterally Postsurgical changes. Electronically Signed   By: Alcide CleverMark  Lukens M.D.   On: 05/27/2018 21:41   Ct Angio Chest/abd/pel For Dissection W And/or Wo Contrast  Result Date: 05/27/2018 CLINICAL DATA:  Cardiac arrest. EXAM: CT ANGIOGRAPHY CHEST, ABDOMEN AND PELVIS TECHNIQUE: Multidetector CT imaging through the chest, abdomen and pelvis was performed using the standard protocol during bolus administration of intravenous contrast. Multiplanar reconstructed images and MIPs were obtained and reviewed to evaluate the vascular anatomy. CONTRAST:  125mL ISOVUE-370 IOPAMIDOL (ISOVUE-370) INJECTION 76% COMPARISON:  Radiograph earlier this day. Chest in abdominal CT dissection protocol 09/19/2009. Report from dissection protocol CT at an outside institution 05/05/2017 FINDINGS: CTA CHEST FINDINGS Cardiovascular: Redemonstration of type B aortic dissection post endovascular stent graft repair. Graft regional nodes at the level of the left subclavian which is occluded as described previously. There is distal reconstitution of the subclavian, not well assessed due to streak artifact from venous contrast in the axilla. Brachiocephalic and left common carotid artery remain patent. Ascending aorta is normal in caliber. Embolization coils the  level of the distal arch lies aortic isthmus, as described on prior. The endovascular stent remains patent. False lumen surrounding the stent is thrombosed which was described previously, residual luminal dimension of 5.0 x 4.2 cm, previously reported 4.8 x 4.2 cm. Immediately distal to the stent graft in the descending thoracic aorta is focal opacification of the false lumen distal to this outpouching the false lumen is thrombosed, which was described previously. Pacemaker in place. Mild cardiomegaly. No central pulmonary embolus. No pericardial effusion. Mediastinum/Nodes: Prominent soft tissue density at the right hilum likely lymph nodes, not well assessed due to adjacent lung consolidation. Otherwise no evidence of adenopathy. Esophagus is decompressed. Endotracheal tube tip above the carina. Lungs/Pleura: Perihilar ground-glass opacities, likely pulmonary edema. Dense consolidation in the dependent right upper lobe with air bronchograms small bilateral pleural effusions with adjacent compressive atelectasis, greater on the right. There is septal thickening the apices in the bases consistent with pulmonary edema. 7 mm right middle lobe pulmonary nodule, unchanged from 2011 and considered benign. Probable debris in the right mainstem bronchus causing luminal narrowing to the lower lobe. No pneumothorax. Musculoskeletal: Buckle fractures of anterior lower ribs, commonly seen with CPR. No sternal fracture. Thoracic spine is intact. Review of the MIP images confirms the above findings. CTA ABDOMEN AND PELVIS FINDINGS VASCULAR Aorta: Chronic dissection, at the diaphragmatic hiatus there is opacification of the false lumen. Dissection extends to the iliac bifurcation. Flow within both the true and false lumen to the iliac bifurcation. Thrombus within the false lumen infrarenal E, as described on prior exam. 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 No periaortic stranding to suggest rupture.  Celiac: Patent arising from the true lumen.  No dissection. SMA: Patent arising from the true lumen.  No dissection. Renals: 2 right renal arteries which received flow from the true lumen. Left renal artery receives flow from both the true and false lumen with early luminal branching. IMA: Patent receiving flow from the true lumen. Inflow: Bilateral common iliac stents are in place and remain patent. Dissection extends into the left common iliac artery along the stent proximally and persisting beyond the stent, as described previously. Dissection flap extends to the proximal-most left internal iliac artery. Normal flow within both internal and external iliac arteries. Veins: No obvious venous abnormality within the limitations of this arterial phase study. Review of the MIP  images confirms the above findings. NON-VASCULAR Hepatobiliary: No focal hepatic abnormality on arterial phase exam. Postcholecystectomy without biliary dilatation. Pancreas: No ductal dilatation or inflammation. Spleen: Normal in size without focal abnormality. Adrenals/Urinary Tract: Normal adrenal glands. Homogeneous enhancement of both kidneys without hydronephrosis. Urinary bladder is decompressed by Foley catheter. Stomach/Bowel: Ingested material in the stomach. No gastric wall thickening. No small bowel obstruction or inflammatory change. Moderate stool burden throughout the entire colon. No colonic wall thickening or inflammation. There is sigmoid colonic tortuosity. No abnormal rectal distention. The appendix is not visualized. Lymphatic: No abdominopelvic adenopathy. Reproductive: Status post hysterectomy. No adnexal masses. Other: No free air free fluid. Musculoskeletal: Degenerative disc disease at L5-S1. There are no acute or suspicious osseous abnormalities. Review of the MIP images confirms the above findings. IMPRESSION: 1. 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 Thoracic dissection extends into the abdominal aorta through the left common iliac artery as before. No evidence of aortic rupture or acute findings. 2. Cardiomegaly with pulmonary edema. Dense consolidation in the right upper lobe with debris in the right mainstem bronchus, highly suggestive of aspiration. Small bilateral pleural effusions with adjacent lower lobe atelectasis. 3. Buckle fractures of bilateral anterior ribs, commonly seen with CPR. 4. No acute findings in the abdomen or pelvis. Large colonic stool burden suggesting constipation. Electronically Signed   By: Narda Rutherford M.D.   On: 05/27/2018 23:05    Consults: Treatment Team:  Thana Farr, MD Laurier Nancy, MD   Subjective:    Overnight Issues: Tolerating thin liquids and DYS 2 diet. Hypertensive overnight. Given one dose of hydralazine 10mg  and extra lasix 20mg  with improvement in BP. Offers no specific complaints.   Objective:  Vital signs for last 24 hours: Temp:  [98.1 F (36.7 C)-98.8 F (37.1 C)] 98.1 F (36.7 C) (02/02 0500) Pulse Rate:  [66-94] 68 (02/02 0500) Resp:  [13-26] 14 (02/02 0500) BP: (125-185)/(60-157) 141/76 (02/02 0500) SpO2:  [92 %-98 %] 97 % (02/02 0500) Weight:  [105.8 kg] 105.8 kg (02/02 0500)  Hemodynamic parameters for last 24 hours:    Intake/Output from previous day: 02/01 0701 - 02/02 0700 In: 1550 [P.O.:1510; I.V.:40] Out: 2800 [Urine:2800]  Intake/Output this shift: Total I/O In: 730 [P.O.:720; I.V.:10] Out: 800 [Urine:800]  Physical Exam:  GENERAL:Alert and responsive HEAD: Normocephalic, atraumatic.  EYES: Pupils equal, round, reactive to light.  No scleral icterus.  MOUTH: Moist mucosal membrane NECK: Supple. No thyromegaly. No nodules. No JVD.  PULMONARY: CTAB, no wheezing CARDIOVASCULAR: S1 and S2. Regular rate and rhythm. No murmurs, rubs, or gallops.  GASTROINTESTINAL: Soft, nontender,  -distended. No masses. Positive bowel sounds. No hepatosplenomegaly.  MUSCULOSKELETAL: No swelling, clubbing, or edema.  NEUROLOGIC: alert and oriented to person and place, moves all extremities SKIN:intact,warm,dry  Assessment/Plan:   Status post cardiac arrest.  Patient with witnessed PEA arrest, status post targeted temperature management.  Felt to be primary cardiac event.  Echocardiogram reveals severe LV dysfunction with ejection fraction of 20 to 25%.  Prior history of coronary artery disease. Cards following  Hypoxemic respiratory failure.  Patient was successfully extubated yesterday, doing quite well weaned down to nasal cannula with no respiratory distress  Hypertension: Continue prn hydralazine and transition to home BP meds  Chronic systolic heart failure with reduced LVEF ~20-25%: Continue lasix 20mg  daily. Cardiology following  Hyperglycemia.  Sliding scale coverage  Prerenal azotemia.  Will  follow BUN and creatinine closely  Anemia.  No evidence of active bleeding  Patient is stable for transfer out of the ICU  Magddalene S Tukov-Yual 06/03/2018

## 2018-06-03 NOTE — Progress Notes (Signed)
Pastoral Care visit   06/03/18 1115  Clinical Encounter Type  Visited With Patient  Visit Type Follow-up  Referral From Family  Consult/Referral To Chaplain  Spiritual Encounters  Spiritual Needs Emotional;Prayer  Stress Factors  Patient Stress Factors None identified;Other (Comment) (back pain)   Pt was resting but woke up upon chap entering.  Pt was thankful for being alive and shared that she must have more "work to do."  Pt shared about her family and pets (pit bull and 4/5 cats).  Pt's family was not present. Mirna MiresChap prayed with pt for continued healing.  Milinda AntisAllen Cordelle Dahmen, [address]

## 2018-06-03 NOTE — Progress Notes (Signed)
Advanced Care Plan.  Purpose of Encounter: CODE STATUS. Parties in Attendance: The patient and me. Patient's Decisional Capacity: Yes. Medical Story: Desiree Mccall  is a 68 y.o. female with a history of aortic dissection, CHF, CAD, hypertension, diabetes, heart attack, A. fib and V. Fib, s/p AICD.  She is admitted for cardiac arrest, cardiogenic shock, acute respiratory failure with hypoxia due to acute on chronic systolic CHF with ejection fraction 20 to 25%.  She was intubated and extubated.  On oxygen by nasal cannula 4 L now.  I discussed with the patient about her current condition, high risk to have cardiopulmonary arrest, poor prognosis and CODE STATUS.  The patient stated that she wants to be resuscitated and intubated if she has cardiopulmonary arrest again. Plan:  Code Status: Full code. Time spent discussing advance care planning: 18 minutes.

## 2018-06-04 LAB — BASIC METABOLIC PANEL
Anion gap: 7 (ref 5–15)
BUN: 27 mg/dL — ABNORMAL HIGH (ref 8–23)
CO2: 37 mmol/L — ABNORMAL HIGH (ref 22–32)
Calcium: 9.2 mg/dL (ref 8.9–10.3)
Chloride: 99 mmol/L (ref 98–111)
Creatinine, Ser: 0.46 mg/dL (ref 0.44–1.00)
GFR calc Af Amer: >60 mL/min (ref >60)
Glucose, Bld: 177 mg/dL — ABNORMAL HIGH (ref 70–99)
Potassium: 3.4 mmol/L — ABNORMAL LOW (ref 3.5–5.1)
Sodium: 143 mmol/L (ref 135–145)

## 2018-06-04 LAB — CBC
HCT: 37.2 % (ref 36.0–46.0)
Hemoglobin: 11.4 g/dL — ABNORMAL LOW (ref 12.0–15.0)
MCH: 26.3 pg (ref 26.0–34.0)
MCHC: 30.6 g/dL (ref 30.0–36.0)
MCV: 85.9 fL (ref 80.0–100.0)
PLATELETS: 263 10*3/uL (ref 150–400)
RBC: 4.33 MIL/uL (ref 3.87–5.11)
RDW: 15.8 % — ABNORMAL HIGH (ref 11.5–15.5)
WBC: 9.4 10*3/uL (ref 4.0–10.5)
nRBC: 0 % (ref 0.0–0.2)

## 2018-06-04 LAB — GLUCOSE, CAPILLARY
GLUCOSE-CAPILLARY: 184 mg/dL — AB (ref 70–99)
GLUCOSE-CAPILLARY: 187 mg/dL — AB (ref 70–99)
GLUCOSE-CAPILLARY: 220 mg/dL — AB (ref 70–99)
Glucose-Capillary: 195 mg/dL — ABNORMAL HIGH (ref 70–99)
Glucose-Capillary: 185 mg/dL — ABNORMAL HIGH (ref 70–99)
Glucose-Capillary: 207 mg/dL — ABNORMAL HIGH (ref 70–99)

## 2018-06-04 LAB — PROTIME-INR
INR: 1.22
Prothrombin Time: 15.3 seconds — ABNORMAL HIGH (ref 11.4–15.2)

## 2018-06-04 MED ORDER — POTASSIUM CHLORIDE CRYS ER 20 MEQ PO TBCR
20.0000 meq | EXTENDED_RELEASE_TABLET | Freq: Once | ORAL | Status: AC
  Administered 2018-06-04: 20 meq via ORAL
  Filled 2018-06-04: qty 1

## 2018-06-04 MED ORDER — METOPROLOL TARTRATE 25 MG PO TABS
25.0000 mg | ORAL_TABLET | Freq: Four times a day (QID) | ORAL | Status: DC
  Administered 2018-06-04: 25 mg via ORAL
  Filled 2018-06-04: qty 1

## 2018-06-04 MED ORDER — ENSURE MAX PROTEIN PO LIQD
11.0000 [oz_av] | Freq: Two times a day (BID) | ORAL | Status: DC
  Administered 2018-06-04: 11 [oz_av] via ORAL
  Filled 2018-06-04: qty 330

## 2018-06-04 MED ORDER — AMIODARONE IV BOLUS ONLY 150 MG/100ML
150.0000 mg | Freq: Once | INTRAVENOUS | Status: DC
  Filled 2018-06-04: qty 100

## 2018-06-04 MED ORDER — DOCUSATE SODIUM 100 MG PO CAPS
100.0000 mg | ORAL_CAPSULE | Freq: Every day | ORAL | Status: DC
  Administered 2018-06-04 – 2018-06-05 (×2): 100 mg via ORAL
  Filled 2018-06-04 (×2): qty 1

## 2018-06-04 MED ORDER — AMIODARONE HCL IN DEXTROSE 360-4.14 MG/200ML-% IV SOLN
30.0000 mg/h | INTRAVENOUS | Status: DC
  Administered 2018-06-04 – 2018-06-05 (×3): 30 mg/h via INTRAVENOUS
  Filled 2018-06-04 (×2): qty 200

## 2018-06-04 MED ORDER — SACUBITRIL-VALSARTAN 24-26 MG PO TABS
1.0000 | ORAL_TABLET | Freq: Two times a day (BID) | ORAL | Status: DC
  Administered 2018-06-04 – 2018-06-05 (×3): 1 via ORAL
  Filled 2018-06-04 (×4): qty 1

## 2018-06-04 MED ORDER — CARVEDILOL 6.25 MG PO TABS
6.2500 mg | ORAL_TABLET | Freq: Two times a day (BID) | ORAL | Status: DC
  Administered 2018-06-05 (×2): 6.25 mg via ORAL
  Filled 2018-06-04 (×4): qty 1

## 2018-06-04 MED ORDER — SENNOSIDES-DOCUSATE SODIUM 8.6-50 MG PO TABS: 1.0000 | ORAL_TABLET | Freq: Two times a day (BID) | ORAL | Status: DC | PRN

## 2018-06-04 MED ORDER — AMIODARONE LOAD VIA INFUSION
150.0000 mg | Freq: Once | INTRAVENOUS | Status: AC
  Administered 2018-06-04: 150 mg via INTRAVENOUS

## 2018-06-04 MED ORDER — AMIODARONE HCL IN DEXTROSE 360-4.14 MG/200ML-% IV SOLN
60.0000 mg/h | INTRAVENOUS | Status: AC
  Administered 2018-06-04 (×2): 60 mg/h via INTRAVENOUS
  Filled 2018-06-04 (×2): qty 200

## 2018-06-04 MED ORDER — WARFARIN SODIUM 7.5 MG PO TABS
7.5000 mg | ORAL_TABLET | Freq: Every day | ORAL | Status: DC
  Filled 2018-06-04: qty 1

## 2018-06-04 NOTE — Progress Notes (Signed)
Has afib with RVR and h/o cardiac arrest, will have to cath the patient in AM prior to discharge.

## 2018-06-04 NOTE — Progress Notes (Signed)
Pt transferred to ICU room 1, report given to [place], Charity fundraiser. Updated pt . Pt said she would call and update her family. Shirley Friar, RN, BSN

## 2018-06-04 NOTE — NC FL2 (Signed)
[place] MEDICAID FL2 LEVEL OF CARE SCREENING TOOL     IDENTIFICATION  Patient Name: Desiree Mccall Birthdate: 03-28-1951 Sex: female Admission Date (Current Location): 05/27/2018  [place] and [place] Number:  Chiropodist and Address:  [facility], [address], [place], [place] [address]      Provider Number: [id number]  Attending Physician Name and Address:  Delfino Lovett, MD  Relative Name and Phone Number:  Kathlene November Daughter [phone]  or Lenord Fellers Daughter   [phone]     Current Level of Care: Hospital Recommended Level of Care: Skilled Nursing Facility Prior Approval Number:    Date Approved/Denied:   PASRR Number: [id number]  Discharge Plan: SNF    Current Diagnoses: Patient Active Problem List   Diagnosis Date Noted  . Chronic systolic CHF (congestive heart failure) (HCC) 05/28/2018  . Screening for malignant neoplasm of respiratory organ 07/19/2017  . H/O acute myocardial infarction 05/30/2017  . H/O: depression 05/30/2017  . Severe tobacco use disorder 05/30/2017  . Carotid stenosis, asymptomatic, left 05/04/2017  . OSA on CPAP 05/04/2017  . 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 Cat bite of forearm 11/22/2014  . Diabetes mellitus type 2, noninsulin dependent (HCC) 05/08/2014  . Syncope 04/10/2013  . Cardiac arrest (HCC) 04/10/2013  . Automatic implantable cardioverter-defibrillator in situ 07/13/2012  . Aortic dissection (HCC) 11/22/2011  . CAD (coronary artery disease) 11/22/2011  . Dyslipidemia 11/22/2011  . History of stroke 11/22/2011  . Ischemic cardiomyopathy 11/22/2011  . Lumbar disc disease 11/22/2011  . PAD (peripheral artery disease) (HCC) 11/22/2011  . Pancreatitis due to common bile duct stone 11/22/2011    Orientation RESPIRATION BLADDER Height & Weight     Self, Time, Situation, Place  Normal Incontinent Weight: 231 lb 7 oz (105 kg) Height:  5\' 8"  (172.7 cm)   BEHAVIORAL SYMPTOMS/MOOD NEUROLOGICAL BOWEL NUTRITION STATUS      Continent Diet(Dysphagia 3 diet)  AMBULATORY STATUS COMMUNICATION OF NEEDS Skin   Limited Assist Verbally Normal                       Personal Care Assistance Level of Assistance  Bathing, Feeding, Dressing Bathing Assistance: Limited assistance Feeding assistance: Independent Dressing Assistance: Limited assistance     Functional Limitations Info  Sight, Hearing, Speech Sight Info: Adequate Hearing Info: Adequate Speech Info: Adequate    SPECIAL CARE FACTORS FREQUENCY  PT (By licensed PT), OT (By licensed OT)     PT Frequency: 5x a week OT Frequency: 5x a week            Contractures Contractures Info: Not present    Additional Factors Info  Code Status, Allergies, Psychotropic, Insulin Sliding Scale Code Status Info: Full Code Allergies Info: [name] Psychotropic Info: FLUoxetine (PROZAC) 20 MG/5ML solution 40 mg  Insulin Sliding Scale Info: insulin aspart (novoLOG) injection 0-15 Units 3x a day with meals       Current Medications (06/04/2018):  This is the current hospital active medication list Current Facility-Administered Medications  Medication Dose Route Frequency Provider Last Rate Last Dose  . acetaminophen (TYLENOL) tablet 650 mg  650 mg Oral Q4H PRN Oralia Manis, MD   650 mg at 06/03/18 2315  . albuterol (PROVENTIL) (2.5 MG/3ML) 0.083% nebulizer solution 2.5 mg  2.5 mg Nebulization Q2H PRN Conforti, John, DO      . amiodarone (NEXTERONE PREMIX) 360-4.14 MG/200ML-% (1.8 mg/mL) IV infusion  60 mg/hr Intravenous Continuous Shah, Vipul,  MD 33.3 mL/hr at 06/04/18 1547 60 mg/hr at 06/04/18 1547  . amiodarone (NEXTERONE PREMIX) 360-4.14 MG/200ML-% (1.8 mg/mL) IV infusion  30 mg/hr Intravenous Continuous Sherryll Burger, Vipul, MD      . amiodarone (NEXTERONE) 1.8 mg/mL load via infusion 150 mg  150 mg Intravenous Once Adrian Blackwater A, MD      . atorvastatin (LIPITOR) tablet 40 mg  40 mg  Oral q1800 Bertram Savin, RPH   40 mg at 06/03/18 1714  . bisacodyl (DULCOLAX) suppository 10 mg  10 mg Rectal Daily PRN Tukov-Yual, Magdalene S, NP   10 mg at 05/31/18 0954  . budesonide (PULMICORT) nebulizer solution 0.5 mg  0.5 mg Nebulization BID Erin Fulling, MD   0.5 mg at 06/04/18 0744  . carvedilol (COREG) tablet 6.25 mg  6.25 mg Oral BID WC Caroleen Hamman, FNP      . clopidogrel (PLAVIX) tablet 75 mg  75 mg Oral q morning - 10a Shaune Pollack, MD   75 mg at 06/04/18 1130  . docusate sodium (COLACE) capsule 100 mg  100 mg Oral Daily Delfino Lovett, MD   100 mg at 06/04/18 1131  . famotidine (PEPCID) tablet 20 mg  20 mg Oral BID Conforti, John, DO   20 mg at 06/04/18 1131  . FLUoxetine (PROZAC) 20 MG/5ML solution 40 mg  40 mg Per Tube Daily Conforti, John, DO   40 mg at 06/03/18 1300  . furosemide (LASIX) injection 20 mg  20 mg Intravenous Daily Tukov-Yual, Magdalene S, NP   20 mg at 06/04/18 1146  . furosemide (LASIX) injection 20 mg  20 mg Intravenous Once Tukov-Yual, Magdalene S, NP      . heparin injection 5,000 Units  5,000 Units Subcutaneous Q8H Tukov-Yual, Magdalene S, NP   5,000 Units at 06/04/18 1419  . hydrALAZINE (APRESOLINE) injection 10 mg  10 mg Intravenous Q4H PRN Tukov-Yual, Magdalene S, NP   10 mg at 06/02/18 2014  . insulin aspart (novoLOG) injection 0-15 Units  0-15 Units Subcutaneous TID WC Shaune Pollack, MD   3 Units at 06/04/18 1140  . insulin aspart (novoLOG) injection 0-5 Units  0-5 Units Subcutaneous QHS Shaune Pollack, MD   2 Units at 06/03/18 2316  . insulin glargine (LANTUS) injection 5 Units  5 Units Subcutaneous Daily Eugenie Norrie, NP   5 Units at 06/04/18 1027  . ipratropium-albuterol (DUONEB) 0.5-2.5 (3) MG/3ML nebulizer solution 3 mL  3 mL Nebulization TID Conforti, John, DO   3 mL at 06/04/18 1435  . MEDLINE mouth rinse  15 mL Mouth Rinse BID Tukov-Yual, Magdalene S, NP   15 mL at 06/04/18 1419  . menthol-cetylpyridinium (CEPACOL) lozenge 3 mg  1 lozenge Oral  PRN Tukov-Yual, Magdalene S, NP      . montelukast (SINGULAIR) tablet 10 mg  10 mg Oral Daily Shaune Pollack, MD   10 mg at 06/04/18 1131  . multivitamin with minerals tablet 1 tablet  1 tablet Oral Daily Mauri Reading, [place]   1 tablet at 06/04/18 1131  . ondansetron (ZOFRAN) injection 4 mg  4 mg Intravenous Q6H PRN Conforti, John, DO   4 mg at 06/03/18 1307  . pantoprazole sodium (PROTONIX) 40 mg/20 mL oral suspension 40 mg  40 mg Oral QHS Conforti, John, DO   40 mg at 06/02/18 2124  . protein supplement (ENSURE MAX) liquid  11 oz Oral BID Delfino Lovett, MD      . sacubitril-valsartan (ENTRESTO) 24-26 mg per tablet  1 tablet Oral  BID Caroleen Hammanunningham, Kristin, FNP   1 tablet at 06/04/18 1131  . senna-docusate (Senokot-S) tablet 1 tablet  1 tablet Oral BID PRN Delfino LovettShah, Vipul, MD      . sodium chloride flush (NS) 0.9 % injection 10-40 mL  10-40 mL Intracatheter Q12H Tukov-Yual, Magdalene S, NP   10 mL at 06/04/18 1139  . sodium chloride flush (NS) 0.9 % injection 10-40 mL  10-40 mL Intracatheter PRN Tukov-Yual, Magdalene S, NP      . spironolactone (ALDACTONE) tablet 25 mg  25 mg Oral Daily Shaune Pollackhen, Qing, MD   25 mg at 06/04/18 1130     Discharge Medications: Please see discharge summary for a list of discharge medications.  Relevant Imaging Results:  Relevant Lab Results:   Additional Information SSN [id number]  Darleene Cleavernterhaus, Emersen Carroll R, [place]WA

## 2018-06-04 NOTE — Clinical Social Work Note (Signed)
CSW received consult that patient may need SNF placement.  CSW to begin bed search within [facility].  Ervin Knack. Hunt Zajicek, MSW, Theresia Majors [phone]  06/04/2018 12:46 PM

## 2018-06-04 NOTE — Progress Notes (Signed)
[employer] called to interrogate the patients pacemaker/AID.

## 2018-06-04 NOTE — Progress Notes (Signed)
eLink Physician-Brief Progress Note Patient Name: Desiree Mccall DOB: [birth year] MRN: [id number]   Date of Service  06/04/2018  HPI/Events of Note  68 y.o.femalewith PMH Aortic dissection, CHF, DM and heart attack. Tranferred back to ICU with AFIB/RVR. Now on an Amiodarone IV infusion. PCCM asked to assume care. VSS.   eICU Interventions  No new orders.      Intervention Category Evaluation Type: New Patient Evaluation  [name] 06/04/2018, 10:41 PM

## 2018-06-04 NOTE — Care Management Important Message (Signed)
Copy of signed Medicare IM left with patient in room. 

## 2018-06-04 NOTE — Progress Notes (Addendum)
SUBJECTIVE: Pt is weak, has shortness of breath, but no chest pain. She states her ICD did not fire during her cardiac arrest. She has a long history of coronary artery disease and is followed at [facility].   Vitals:   06/03/18 0800 06/03/18 1545 06/03/18 2135 06/04/18 0509  BP: (!) 158/68 (!) 165/71 (!) 168/71 (!) 168/90  Pulse: 73  79 70  Resp: 14  18 19   Temp:   98.1 F (36.7 C) 97.9 F (36.6 C)  TempSrc:   Oral   SpO2: 92%  95% 96%  Weight:    105 kg  Height:        Intake/Output Summary (Last 24 hours) at 06/04/2018 0838 Last data filed at 06/03/2018 1346 Gross per 24 hour  Intake 240 ml  Output -  Net 240 ml    LABS: Basic Metabolic Panel: Recent Labs    06/03/18 0501 06/04/18 0442  NA 141 143  K 3.9 3.4*  CL 99 99  CO2 38* 37*  GLUCOSE 179* 177*  BUN 30* 27*  CREATININE 0.52 0.46  CALCIUM 9.2 9.2  MG 2.1  --   PHOS 4.4  --    Liver Function Tests: No results for input(s): AST, ALT, ALKPHOS, BILITOT, PROT, ALBUMIN in the last 72 hours. No results for input(s): LIPASE, AMYLASE in the last 72 hours. CBC: Recent Labs    06/03/18 0501 06/04/18 0442  WBC 11.4* 9.4  NEUTROABS 10.1*  --   HGB 10.9* 11.4*  HCT 36.0 37.2  MCV 88.9 85.9  PLT 235 263   Cardiac Enzymes: No results for input(s): CKTOTAL, CKMB, CKMBINDEX, TROPONINI in the last 72 hours. BNP: Invalid input(s): POCBNP D-Dimer: No results for input(s): DDIMER in the last 72 hours. Hemoglobin A1C: Recent Labs    06/03/18 0501  HGBA1C 7.0*   Fasting Lipid Panel: No results for input(s): CHOL, HDL, LDLCALC, TRIG, CHOLHDL, LDLDIRECT in the last 72 hours. Thyroid Function Tests: No results for input(s): TSH, T4TOTAL, T3FREE, THYROIDAB in the last 72 hours.  Invalid input(s): FREET3 Anemia Panel: No results for input(s): VITAMINB12, FOLATE, FERRITIN, TIBC, IRON, RETICCTPCT in the last 72 hours.   PHYSICAL EXAM General: Weak, pale, mild shortness of breath. HEENT:  Normocephalic and  atramatic Neck:  No JVD.  Lungs: Clear bilaterally to auscultation and percussion. Heart: HRRR . Normal S1 and S2 without gallops or murmurs.  Abdomen: Bowel sounds are positive, abdomen soft and non-tender  Msk:  Back normal, normal gait. Normal strength and tone for age. Extremities: No clubbing, cyanosis or edema.   Neuro: Alert and oriented X 3. Psych:  Good affect, responds appropriately  TELEMETRY: NSR 78bpm  ASSESSMENT AND PLAN: Patient is looking much better but is still weak and short of breath.   CHF: EF 20-25%, start Entresto 24-26mg  BID, continue coreg and aldactone.     Needs ICD interrogation as ICD did not fire during cardiac arrest. Paged [employer] to come and interrogate device.  Pt has a long history of coronary artery disease, MI, aortic dissection and typically sees a cardiologist at [facility].   HTN: Elevated blood pressure, start Entresto, increased Coreg, continue aldactone, and continue to monitor.  Recommend outpatient follow up with her [facility]ologist Dr. Minerva Areola and [facility] EP Dr. Varney Baas for follow up and evaluation of ICD.    Principal Problem:   Cardiac arrest [facility]) Active Problems:   CAD (coronary artery disease)   Diabetes mellitus type 2, noninsulin dependent (HCC)   Chronic systolic  CHF (congestive heart failure) (HCC)    Caroleen Hamman, NP-C 06/04/2018 8:38 AM Cell: [phone]

## 2018-06-04 NOTE — Plan of Care (Signed)
  Problem: Clinical Measurements: Goal: Ability to maintain clinical measurements within normal limits will improve Outcome: Progressing   

## 2018-06-04 NOTE — Progress Notes (Signed)
Follow up - Critical Care Medicine Note  Patient Details:    Desiree Mccall is an 68 y.o. female.68 y.o. who Patient presents to the ED status post cardiac arrest.  She was at her home when she became acutely short of breath.  She called EMS and when they arrived she very quickly became unresponsive and turned "purple".  Per EMS she was PEA arrest, and received 1 round of epinephrine and CPR and then obtained ROSC.    Patient was admitted to the intensive care unit for TTM. She is now status post targeted temperature management, extubated and doing well. Transferred to the telemetry unit on 06/03/2018.  She required transfer to the stepdown unit on 06/04/2018 with atrial fibrillation with rvr requiring amiodarone gtt.    Lines, Airways, Drains: Airway 7.5 mm (Active)  Secured at (cm) 23 cm 06/01/2018  7:19 AM  Measured From Lips 06/01/2018  7:19 AM  Secured Location Center 06/01/2018  7:19 AM  Secured By [employer] 06/01/2018  7:19 AM  Tube Holder Repositioned Yes 06/01/2018  3:16 AM  Cuff Pressure (cm H2O) 30 cm H2O 06/01/2018  3:16 AM  Site Condition Dry 06/01/2018  7:19 AM  Date Prophylactic Dressing Applied (if applicable) 05/27/18 05/31/2018  8:00 AM     CVC Triple Lumen 05/28/18 Right Internal jugular (Active)  Indication for Insertion or Continuance of Line Vasoactive infusions 05/31/2018  8:00 PM  Site Assessment Clean;Dry;Intact 05/31/2018  8:00 PM  Proximal Lumen Status Saline locked;Flushed;Blood return noted 05/31/2018  8:00 PM  Medial Lumen Status Flushed;Blood return noted 05/31/2018  8:00 PM  Distal Lumen Status Flushed;Blood return noted 05/31/2018  8:00 PM  Dressing Type Transparent 05/31/2018  8:00 PM  Dressing Status Intact;Antimicrobial disc in place 05/31/2018  8:00 PM  Line Care Connections checked and tightened 05/31/2018  8:00 PM  Dressing Intervention Dressing changed;Antimicrobial disc changed 05/28/2018  9:00 PM  Dressing Change Due 06/04/18 05/31/2018  8:00 PM      Arterial Line 05/28/18 Right Femoral (Active)  Site Assessment Clean;Dry;Intact 05/31/2018  8:00 PM  Line Status Pulsatile blood flow 05/31/2018  8:00 PM  Art Line Waveform Appropriate 05/31/2018  8:00 PM  Art Line Interventions Connections checked and tightened 05/31/2018  8:00 PM  Dressing Type Transparent 05/31/2018  8:00 PM  Dressing Status Clean;Dry;Intact 05/31/2018  8:00 PM  Interventions New dressing 05/28/2018  6:55 AM  Dressing Change Due 06/04/18 05/31/2018  8:00 PM     NG/OG Tube Orogastric 18 Fr. Center mouth Documented cm marking at nare/ corner of mouth 64 cm (Active)  Cm Marking at Nare/Corner of Mouth (if applicable) 64 cm 06/01/2018  4:00 AM  Site Assessment Clean;Dry;Intact 06/01/2018  4:00 AM  Ongoing Placement Verification No change in cm markings or external length of tube from initial placement;No change in respiratory status;No acute changes, not attributed to clinical condition;Xray 06/01/2018  4:00 AM  Status Infusing tube feed 06/01/2018  4:00 AM  Drainage Appearance Straw 05/28/2018  5:30 AM     Urethral Catheter Brittney Sparks NTII Temperature probe 16 Fr. (Active)  Indication for Insertion or Continuance of Catheter Unstable critical patients (first 24-48 hours) 06/01/2018  4:00 AM  Site Assessment Clean;Intact 06/01/2018  4:00 AM  Catheter Maintenance Bag below level of bladder;Catheter secured;Drainage bag/tubing not touching floor;Insertion date on drainage bag;No dependent loops;Seal intact 06/01/2018  4:00 AM  Collection Container Standard drainage bag 06/01/2018  4:00 AM  Securement Method Securing device (Describe) 06/01/2018  4:00 AM  Urinary Catheter Interventions  Unclamped 06/01/2018  4:00 AM  Output (mL) 60 mL 06/01/2018  6:00 AM    Anti-infectives:  Anti-infectives (From admission, onward)   Start     Dose/Rate Route Frequency Ordered Stop   05/28/18 0600  piperacillin-tazobactam (ZOSYN) IVPB 3.375 g  Status:  Discontinued     3.375 g 12.5 mL/hr over 240  Minutes Intravenous Every 8 hours 05/28/18 0446 05/28/18 0815   05/27/18 2200  ceFEPIme (MAXIPIME) 1 g in sodium chloride 0.9 % 100 mL IVPB     1 g 200 mL/hr over 30 Minutes Intravenous  Once 05/27/18 2145 05/27/18 2340   05/27/18 2200  vancomycin (VANCOCIN) IVPB 1000 mg/200 mL premix     1,000 mg 200 mL/hr over 60 Minutes Intravenous  Once 05/27/18 2145 05/28/18 0050      Microbiology: Results for orders placed or performed during the hospital encounter of 05/27/18  Urine Culture     Status: None   Collection Time: 05/27/18  9:20 PM  Result Value Ref Range Status   Specimen Description   Final    URINE, RANDOM Performed at [facility], [address], [place], [place] [address]    Special Requests   Final    NONE Performed at [facility], [address] [place], [place] [address]    Culture   Final    NO GROWTH Performed at [facility], [address] [place], [place] [address]    Report Status 05/29/2018 FINAL  Final  Blood culture (routine x 2)     Status: None   Collection Time: 05/27/18  9:50 PM  Result Value Ref Range Status   Specimen Description BLOOD RIGHT HAND  Final   Special Requests Blood Culture adequate volume  Final   Culture   Final    NO GROWTH 5 DAYS Performed at [facility], [address] [place], [place] [address]    Report Status 06/01/2018 FINAL  Final  Blood culture (routine x 2)     Status: None   Collection Time: 05/27/18 10:57 PM  Result Value Ref Range Status   Specimen Description BLOOD LEFT ANTECUBITAL  Final   Special Requests Blood Culture adequate volume  Final   Culture   Final    NO GROWTH 5 DAYS Performed at [facility], [address], [place], [place] [address]    Report Status 06/01/2018 FINAL  Final  MRSA PCR Screening     Status: None   Collection Time: 05/28/18  2:24 AM  Result Value Ref Range Status   MRSA by PCR NEGATIVE NEGATIVE Final    Comment:        The  GeneXpert MRSA Assay (FDA approved for NASAL specimens only), is one component of a comprehensive MRSA colonization surveillance program. It is not intended to diagnose MRSA infection nor to guide or monitor treatment for MRSA infections. Performed at [facility], [address] [place], [place] [address]   Culture, respiratory (non-expectorated)     Status: None   Collection Time: 05/28/18  4:29 AM  Result Value Ref Range Status   Specimen Description   Final    TRACHEAL ASPIRATE Performed at [facility], [address] [place], [place] [address]    Special Requests   Final    NONE Performed at [facility], [address] [place], [place] [address]    Gram Stain   Final    ABUNDANT WBC PRESENT,BOTH PMN AND MONONUCLEAR FEW GRAM POSITIVE  COCCI FEW GRAM POSITIVE RODS RARE GRAM NEGATIVE RODS    Culture   Final    FEW Consistent with normal respiratory flora. Performed at [facility], [address], [place], [place] [address]    Report Status 05/30/2018 FINAL  Final  Culture, respiratory (non-expectorated)     Status: None   Collection Time: 05/31/18  3:32 PM  Result Value Ref Range Status   Specimen Description   Final    TRACHEAL ASPIRATE Performed at [facility], [address] [place], [place] [address]    Special Requests   Final    NONE Performed at [facility], [address] [place], [place] [address]    Gram Stain   Final    MODERATE WBC PRESENT, PREDOMINANTLY PMN FEW GRAM POSITIVE COCCI RARE GRAM POSITIVE RODS    Culture   Final    FEW Consistent with normal respiratory flora. Performed at [facility], [address], [place], [place] [address]    Report Status 06/03/2018 FINAL  Final    Best Practice/Protocols:  Prophylaxis with heparin subcutaneous and Pepcid IV  Studies: Ct Head Wo Contrast  Result Date: 05/28/2018 CLINICAL DATA:  Recent cardiac arrest. EXAM: CT HEAD  WITHOUT CONTRAST TECHNIQUE: Contiguous axial images were obtained from the base of the skull through the vertex without intravenous contrast. COMPARISON:  Head CT 08/02/2016 FINDINGS: Brain: There is no mass, hemorrhage or extra-axial collection. The size and configuration of the ventricles and extra-axial CSF spaces are normal. There is hypoattenuation of the white matter, most commonly indicating chronic small vessel disease. Vascular: There is retained intravascular contrast material secondary to earlier CTA of the chest. Skull: The visualized skull base, calvarium and extracranial soft tissues are normal. Sinuses/Orbits: No fluid levels or advanced mucosal thickening of the visualized paranasal sinuses. No mastoid or middle ear effusion. The orbits are normal. IMPRESSION: No acute intracranial abnormality. Electronically Signed   By: Deatra Robinson M.D.   On: 05/28/2018 03:39   Dg Chest Port 1 View  Result Date: 06/02/2018 CLINICAL DATA:  Acute respiratory failure. EXAM: PORTABLE CHEST 1 VIEW COMPARISON:  06/01/2018. FINDINGS: The heart is enlarged. ET tube removed. Unchanged central venous catheter tip distal SVC. Unchanged AICD device. Aortic stent graft. Improved aeration, with clearing of edema and basilar atelectasis. Small LEFT effusion remains. IMPRESSION: Improved aeration. Electronically Signed   By: Elsie Stain M.D.   On: 06/02/2018 07:39   Dg Chest Port 1 View  Result Date: 06/01/2018 CLINICAL DATA:  Respiratory failure. EXAM: PORTABLE CHEST 1 VIEW COMPARISON:  05/31/2018. FINDINGS: Endotracheal tube, NG tube, right IJ line in stable position. AICD in stable position. Aortic stent graft and coils in stable position. Stable cardiomegaly. Increased interstitial markings and bilateral pleural effusions. Findings suggest CHF. Bibasilar pneumonia can not be excluded. No pneumothorax. IMPRESSION: 1.  Lines and tubes in stable position. 2. AICD in stable position. Aortic stent graft and coils in  stable position. Stable cardiomegaly. 3. Increased basilar interstitial markings and bilateral pleural effusions. Findings suggest CHF. Bibasilar pneumonia can not be excluded. Electronically Signed   By: Maisie Fus  Register   On: 06/01/2018 05:32   Dg Chest Port 1 View  Result Date: 05/31/2018 CLINICAL DATA:  Respiratory failure. EXAM: PORTABLE CHEST 1 VIEW COMPARISON:  05/31/2002 FINDINGS: The endotracheal tube is 3.8 cm above the carina. The NG tube is coursing down the esophagus and into the stomach. The right IJ catheter is stable. The pacer wires/AICD are stable.  Stable thoracic aortic stent graft. The heart is mildly enlarged but stable. Suspect mild interstitial edema in the lungs but no infiltrates or effusions. IMPRESSION: 1. Stable support apparatus without complicating features. 2. Stable cardiac enlargement and probable mild interstitial edema but no infiltrates or effusions. Electronically Signed   By: Rudie Meyer M.D.   On: 05/31/2018 14:40   Dg Chest Port 1 View  Result Date: 05/31/2018 CLINICAL DATA:  Acute respiratory failure EXAM: PORTABLE CHEST 1 VIEW COMPARISON:  Yesterday FINDINGS: Cardiomegaly with ICD/pacer leads in stable position. Thoracic aortic stent graft. Right IJ line with tip at the SVC. Endotracheal tube with tip between the clavicular heads and carina. The orogastric tube reaches the stomach. Improved aeration and diaphragm visualization on the right. There is still hazy density at the left base at least partially from pleural fluid. No Kerley lines or pneumothorax. IMPRESSION: 1. Stable, unremarkable hardware positioning. 2. Continued retrocardiac opacification from pleural fluid and atelectasis based on admission chest CT. Electronically Signed   By: Marnee Spring M.D.   On: 05/31/2018 06:35   Dg Chest Port 1 View  Result Date: 05/30/2018 CLINICAL DATA:  Acute respiratory failure EXAM: PORTABLE CHEST 1 VIEW COMPARISON:  05/28/2018 FINDINGS: Endotracheal tube, gastric  catheter and right-sided jugular central line are again seen and stable. Defibrillator is again noted and stable. Stable cardiomegaly and aortic stent graft are seen. Mild central vascular congestion is again identified stable from the prior exam. Haziness is noted in the bases bilaterally suggestive of small effusions. No other focal abnormality is seen. IMPRESSION: Overall stable appearance of the chest when compared with the prior exam. Electronically Signed   By: Alcide Clever M.D.   On: 05/30/2018 07:08   Dg Chest Port 1 View  Result Date: 05/28/2018 CLINICAL DATA:  ET tube placement Patient is a 68yo female that is s/p cardiac arrest. Patient was initiated on hypothermia protocol with pharmacy consult to manage electrolytes EXAM: PORTABLE CHEST - 1 VIEW COMPARISON:  Earlier film of the same day FINDINGS: Endotracheal tube, nasogastric tube, right IJ central venous catheter stable in position. Stable left subclavian AICD. Thoracic stent graft and embolization coils appear unchanged. Relatively low lung volumes with some increase in pulmonary vascular congestion and possible mild interstitial edema. Heart size upper limits normal for technique. No effusion. No pneumothorax. Regional bones unremarkable. IMPRESSION: 1. Low volumes with some increase in pulmonary vascular congestion and possible mild interstitial edema. 2. Support hardware stable in position. Electronically Signed   By: Corlis Leak M.D.   On: 05/28/2018 13:51   Dg Chest Port 1 View  Result Date: 05/28/2018 CLINICAL DATA:  Orogastric tube placement, central line placement. EXAM: PORTABLE CHEST 1 VIEW COMPARISON:  Radiographs of May 27, 2018. FINDINGS: Stable cardiomegaly. Status post stent graft repair of descending thoracic aorta. Endotracheal and nasogastric tubes are unchanged in position. Left-sided pacemaker is unchanged in position. No pneumothorax or significant pleural effusion is noted. Minimal left basilar subsegmental  atelectasis is noted. Right lung is clear. Bony thorax is unremarkable. IMPRESSION: Minimal left basilar subsegmental atelectasis. Stable support apparatus. Electronically Signed   By: Lupita Raider, M.D.   On: 05/28/2018 08:07   Dg Chest Portable 1 View  Result Date: 05/27/2018 CLINICAL DATA:  Status post intubation EXAM: PORTABLE CHEST 1 VIEW COMPARISON:  08/02/2016 FINDINGS: Endotracheal tube is noted approximately 1 cm above the carina. It was subsequently withdrawn and reimaged now lying 4 cm above the carina. Changes of prior aortic stent graft are  seen and stable. Defibrillator is again noted. Cardiac shadow is again enlarged. The lungs are well aerated. Some patchy infiltrative changes are noted in the upper lobes bilaterally. No bony abnormality is noted. IMPRESSION: Endotracheal tube in satisfactory position. Patchy infiltrates in the upper lobes bilaterally Postsurgical changes. Electronically Signed   By: Alcide Clever M.D.   On: 05/27/2018 21:41   Ct Angio Chest/abd/pel For Dissection W And/or Wo Contrast  Result Date: 05/27/2018 CLINICAL DATA:  Cardiac arrest. EXAM: CT ANGIOGRAPHY CHEST, ABDOMEN AND PELVIS TECHNIQUE: Multidetector CT imaging through the chest, abdomen and pelvis was performed using the standard protocol during bolus administration of intravenous contrast. Multiplanar reconstructed images and MIPs were obtained and reviewed to evaluate the vascular anatomy. CONTRAST:  ISOVUE-370 IOPAMIDOL (ISOVUE-370) INJECTION 76% COMPARISON:  Radiograph earlier this day. Chest in abdominal CT dissection protocol 09/19/2009. Report from dissection protocol CT at an outside institution 05/05/2017 FINDINGS: CTA CHEST FINDINGS Cardiovascular: Redemonstration of type B aortic dissection post endovascular stent graft repair. Graft regional nodes at the level of the left subclavian which is occluded as described previously. There is distal reconstitution of the subclavian, not well assessed  due to streak artifact from venous contrast in the axilla. Brachiocephalic and left common carotid artery remain patent. Ascending aorta is normal in caliber. Embolization coils the level of the distal arch lies aortic isthmus, as described on prior. The endovascular stent remains patent. False lumen surrounding the stent is thrombosed which was described previously, residual luminal dimension of 5.0 x 4.2 cm, previously reported 4.8 x 4.2 cm. Immediately distal to the stent graft in the descending thoracic aorta is focal opacification of the false lumen distal to this outpouching the false lumen is thrombosed, which was described previously. Pacemaker in place. Mild cardiomegaly. No central pulmonary embolus. No pericardial effusion. Mediastinum/Nodes: Prominent soft tissue density at the right hilum likely lymph nodes, not well assessed due to adjacent lung consolidation. Otherwise no evidence of adenopathy. Esophagus is decompressed. Endotracheal tube tip above the carina. Lungs/Pleura: Perihilar ground-glass opacities, likely pulmonary edema. Dense consolidation in the dependent right upper lobe with air bronchograms small bilateral pleural effusions with adjacent compressive atelectasis, greater on the right. There is septal thickening the apices in the bases consistent with pulmonary edema. 7 mm right middle lobe pulmonary nodule, unchanged from 2011 and considered benign. Probable debris in the right mainstem bronchus causing luminal narrowing to the lower lobe. No pneumothorax. Musculoskeletal: Buckle fractures of anterior lower ribs, commonly seen with CPR. No sternal fracture. Thoracic spine is intact. Review of the MIP images confirms the above findings. CTA ABDOMEN AND PELVIS FINDINGS VASCULAR Aorta: Chronic dissection, at the diaphragmatic hiatus there is opacification of the false lumen. Dissection extends to the iliac bifurcation. Flow within both the true and false lumen to the iliac bifurcation.  Thrombus within the false lumen infrarenal E, as described on prior exam. 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 No periaortic stranding to suggest rupture. Celiac: Patent arising from the true lumen.  No dissection. SMA: Patent arising from the true lumen.  No dissection. Renals: 2 right renal arteries which received flow from the true lumen. Left renal artery receives flow from both the true and false lumen with early luminal branching. IMA: Patent receiving flow from the true lumen. Inflow: Bilateral common iliac stents are in place and remain patent. Dissection extends into the left common iliac artery along the stent proximally and persisting beyond the stent, as described previously. Dissection  flap extends to the proximal-most left internal iliac artery. Normal flow within both internal and external iliac arteries. Veins: No obvious venous abnormality within the limitations of this arterial phase study. Review of the MIP images confirms the above findings. NON-VASCULAR Hepatobiliary: No focal hepatic abnormality on arterial phase exam. Postcholecystectomy without biliary dilatation. Pancreas: No ductal dilatation or inflammation. Spleen: Normal in size without focal abnormality. Adrenals/Urinary Tract: Normal adrenal glands. Homogeneous enhancement of both kidneys without hydronephrosis. Urinary bladder is decompressed by Foley catheter. Stomach/Bowel: Ingested material in the stomach. No gastric wall thickening. No small bowel obstruction or inflammatory change. Moderate stool burden throughout the entire colon. No colonic wall thickening or inflammation. There is sigmoid colonic tortuosity. No abnormal rectal distention. The appendix is not visualized. Lymphatic: No abdominopelvic adenopathy. Reproductive: Status post hysterectomy. No adnexal masses. Other: No free air free fluid. Musculoskeletal: Degenerative disc disease at L5-S1. There are no acute or suspicious osseous  abnormalities. Review of the MIP images confirms the above findings. IMPRESSION: 1. 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 Thoracic dissection extends into the abdominal aorta through the left common iliac artery as before. No evidence of aortic rupture or acute findings. 2. Cardiomegaly with pulmonary edema. Dense consolidation in the right upper lobe with debris in the right mainstem bronchus, highly suggestive of aspiration. Small bilateral pleural effusions with adjacent lower lobe atelectasis. 3. Buckle fractures of bilateral anterior ribs, commonly seen with CPR. 4. No acute findings in the abdomen or pelvis. Large colonic stool burden suggesting constipation. Electronically Signed   By: Narda RutherfordMelanie  Sanford M.D.   On: 05/27/2018 23:05    Consults: Treatment Team:  Laurier NancyKhan, Shaukat A, MD [facility], [facility], MD   Subjective:    Pt developed atrial fibrillation with rvr.   Objective:  Vital signs for last 24 hours: Temp:  [97.7 F (36.5 C)-98.1 F (36.7 C)] 98.1 F (36.7 C) (02/03 1900) Pulse Rate:  [70-167] 136 (02/03 1900) Resp:  [18-19] 19 (02/03 0509) BP: (100-168)/(62-130) 103/76 (02/03 1900) SpO2:  [91 %-97 %] 94 % (02/03 1815) Weight:  [105 kg] 105 kg (02/03 0509)  Hemodynamic parameters for last 24 hours:    Intake/Output from previous day: 02/02 0701 - 02/03 0700 In: 240 [P.O.:240] Out: -   Intake/Output this shift: Total I/O In: 240 [P.O.:240] Out: -   Physical Exam:  GENERAL: Well developed, well nourished female, NAD  HEAD: Normocephalic, atraumatic.  EYES: Pupils equal, round, reactive to light.  No scleral icterus.  MOUTH: Moist mucosal membrane NECK: Supple, no JVD  PULMONARY: CTAB, no wheezing  CARDIOVASCULAR: irregular irregular, no R/G  GASTROINTESTINAL: Soft, nontender, -distended. No masses. Positive bowel sounds. No  hepatosplenomegaly.  MUSCULOSKELETAL: No swelling, clubbing, or edema.  NEUROLOGIC: alert and oriented to person and place, moves all extremities SKIN:intact,warm,dry  Assessment/Plan:   Status post cardiac arrest.  Patient with witnessed PEA arrest, status post targeted temperature management.  Felt to be primary cardiac event.  Echocardiogram reveals severe LV dysfunction with ejection fraction of 20 to 25%.  Prior history of coronary artery disease. Developed atrial fibrillation with rvr amiodarone gtt initiated, continue po carvedilol and atorvastatin, Cards following recommending cardiac catheterization prior to discharge scheduled for 06/05/2018  Hypoxemic respiratory failure.  Patient was successfully extubated 06/01/2018.  Continue supplemental O2 for dyspnea and/or hypoxia, nebulized steroids, and bronchodilator therapy   Hypertension: Continue entresto and prn hydralazine  Chronic systolic heart failure with reduced LVEF ~20-25%: Continue lasix 20mg  daily. Cardiology following  Hyperglycemia.  Sliding scale coverage and scheduled lantus   Prerenal azotemia.  Will follow BUN and creatinine closely  Anemia.  No evidence of active bleeding.  VTE px: subq heparin.  Trend CBC and monitor for s/sx of bleeding, will transfuse for hgb <8  Sonda Rumbleana Cliffie Gingras, AGNP  Pulmonary/Critical Care Pager [phone] (please enter 7 digits) PCCM Consult Pager [phone] (please enter 7 digits)

## 2018-06-04 NOTE — Consult Note (Signed)
ANTICOAGULATION CONSULT NOTE - Initial Consult  Pharmacy Consult for Warfarin Indication: atrial fibrillation  [name]  Patient Measurements: Height: 5\' 8"  (172.7 cm) Weight: 231 lb 7 oz (105 kg) [name] (Calculated) : 63.9   Vital Signs: Temp: 97.7 F (36.5 C) (02/03 0909) Temp Source: Oral (02/03 0909) BP: 151/85 (02/03 0909) Pulse Rate: 77 (02/03 0909)  Labs: Recent Labs    06/02/18 0428 06/03/18 0501 06/04/18 0442  HGB 10.7* 10.9* 11.4*  HCT 35.6* 36.0 37.2  PLT 201 235 263  [name]  --   --  15.3*  [name]  --   --  1.22  CREATININE 0.50 0.52 0.46    Estimated Creatinine Clearance: 88.9 mL/min (by C-G formula based on SCr of 0.46 mg/dL).   Medical History: Past Medical History:  Diagnosis Date  . Aortic dissection (HCC) 2010  . CHF (congestive heart failure) (HCC)   . Diabetes mellitus without complication (HCC)   . [name]   . Heart attack (HCC) sept. 25, 2010     Assessment: 68 yo female admitted with cardiac arrest. Outpatient medications include warfarin 7.5 mg every day, except Tuesday takes 3.75 mg (one-half tablet). Patient has not received a dose since before admission on 1/26, has been receiving prophylaxis with SQ heparin. Last [name] was 1/27 2.39.  Goal of Therapy:  [name] 2-3 Monitor platelets by anticoagulation protocol: Yes   Plan:  H&H stable. 02/03 Ordered Warfarin 7.5mg  daily. Will continue SQ heparin 5000 units SQ until [name] therapeutic. Will order [name] daily and CBC at least every 3 days. Pharmacy will continue to monitor.   Desiree Mccall, PharmD Pharmacy Resident  06/04/2018 3:25 PM

## 2018-06-04 NOTE — Evaluation (Signed)
Occupational Therapy Evaluation Patient Details Name: Desiree Mccall MRN: [id number] DOB: 03/17/1951 Today's Date: 06/04/2018    History of Present Illness Patient is a is a 68 y.o. female with a history of aortic dissection, CHF, CAD,  hypertension, diabetes, heart attack, A. fib and V. Fib, s/p AICD.  She was admitted 1/26 for cardiac arrest, cardiogenic shock, acute respiratory failure with hypoxia due to acute on chronic systolic CHF with ejection fraction 20 to 25%.  She was intubated and extubated.  Moved to floor from CCU today with oxygen by nasal cannula 4 L now. Reports she lives at home with 2 sons, is on disability. Adult daughter reports sons are not able to provide 24/7 assistance.    Clinical Impression   Pt seen for OT evaluation this date. Pt was independent in all ADLs and mobility, living in a 1 story home with 3 steps to enter, no hand rails.  Pt reports becoming easily fatigued or out of breath with minimal exertion. Pt currently requires mod/max assist ADL/MOBILITY due to poor activity tolerance. Pt educated in energy conservation conservation strategies including pursed lip breathing, activity pacing, and home/routines modifications. Pt able to sit EOB with min A and vitals stable for >5 minutes on this date. Pt completed sit to stand x3 in preparation for functional activity, however pt limited by fatigue on this date. Pt stood for ~1 min before needing to sit.  Pt verbalized understanding of education provided throughout the session, but would benefit from additional skilled OT services to maximize recall and carryover of learned techniques and facilitate implementation of learned techniques into daily routines. Upon discharge, recommend SNF.       Follow Up Recommendations  SNF;Supervision/Assistance - 24 hour    Equipment Recommendations  3 in 1 bedside commode;Toilet riser    Recommendations for Other Services       Precautions / Restrictions Precautions Precautions:  Fall Restrictions Weight Bearing Restrictions: No      Mobility Bed Mobility Overal bed mobility: Needs Assistance Bed Mobility: Supine to Sit;Sit to Supine     Supine to sit: Min assist Sit to supine: Min assist   General bed mobility comments: Patient able to utilize handrails of bed with VC fo hand placement and min A from OT to complete supine > EOB sitting. Pt endorsed feeling slightly dizzy. Vitals remained stable with activity.   Transfers Overall transfer level: Needs assistance Equipment used: Rolling walker (2 wheeled) Transfers: Sit to/from Stand Sit to Stand: Mod assist;From elevated surface         General transfer comment: Patient able to initiate and complete sit<>stand x3 with OT assistance, able to stand for ~161min on last attempt before becoming fatigued. Pt eager to move on this date, enaged well with encouragement.     Balance Overall balance assessment: Needs assistance Sitting-balance support: Bilateral upper extremity supported;Feet supported Sitting balance-Leahy Scale: Fair Sitting balance - Comments: Steady reaching within BOS. Required increased time to gain balance.  Postural control: Posterior lean Standing balance support: Bilateral upper extremity supported Standing balance-Leahy Scale: Poor Standing balance comment: Patient unable to maintain standing balance with RW and min A from OT.                           ADL either performed or assessed with clinical judgement   ADL Overall ADL's : Needs assistance/impaired Eating/Feeding: Set up   Grooming: Set up;Bed level   Upper Body Bathing: Minimal assistance;Bed  level   Lower Body Bathing: Maximal assistance;Sitting/lateral leans   Upper Body Dressing : Minimal assistance;Bed level   Lower Body Dressing: Maximal assistance;Sitting/lateral leans   Toilet Transfer: Stand-pivot;BSC;RW           Functional mobility during ADLs: Rolling walker;Moderate assistance;+2 for  safety/equipment General ADL Comments: Pt with limited activity tolerance. May require assistance to maintain balance throughout ADL activity.      Vision Baseline Vision/History: No visual deficits Patient Visual Report: No change from baseline       Perception     Praxis      Pertinent Vitals/Pain Pain Assessment: No/denies pain     Hand Dominance Right   Extremity/Trunk Assessment Upper Extremity Assessment Upper Extremity Assessment: Generalized weakness   Lower Extremity Assessment Lower Extremity Assessment: Generalized weakness;Defer to PT evaluation   Cervical / Trunk Assessment Cervical / Trunk Assessment: Normal   Communication Communication Communication: No difficulties   Cognition Arousal/Alertness: Awake/alert Behavior During Therapy: WFL for tasks assessed/performed Overall Cognitive Status: Within Functional Limits for tasks assessed                                     General Comments       Exercises Other Exercises Other Exercises: Pt and caregiver (daughter) instructed in pursed lip breathing in order to decrease SOB with activity and improve activity tolerance during ADLs   Shoulder Instructions      Home Living Family/patient expects to be discharged to:: Private residence Living Arrangements: Children Available Help at Discharge: Family;Available PRN/intermittently(While pt lives with adult sons, they are not avaialble consistenly to provide care/assistance. Adult daughter available, however works during the day and has children. ) Type of Home: House Home Access: Stairs to enter Secretary/administratorntrance Stairs-Number of Steps: 3 Entrance Stairs-Rails: None Home Layout: One level     Bathroom Shower/Tub: Chief Strategy OfficerTub/shower unit   Bathroom Toilet: Standard Bathroom Accessibility: No   Home Equipment: None   Additional Comments: Reports she ambulated ind without AD prior, drives, runs errands      Prior Functioning/Environment Level of  Independence: Independent                 OT Problem List: Decreased strength;Impaired balance (sitting and/or standing);Decreased activity tolerance;Decreased range of motion;Decreased coordination;Decreased knowledge of use of DME or AE;Decreased safety awareness;Cardiopulmonary status limiting activity      OT Treatment/Interventions: Self-care/ADL training;Therapeutic exercise;Patient/family education;Energy conservation;Therapeutic activities;DME and/or AE instruction    OT Goals(Current goals can be found in the care plan section) Acute Rehab OT Goals Patient Stated Goal: walk independently OT Goal Formulation: With patient/family Time For Goal Achievement: 06/18/18 Potential to Achieve Goals: Good ADL Goals Pt Will Perform Lower Body Bathing: with min guard assist;sit to/from stand;with adaptive equipment(With LRAD for safety.) Pt Will Perform Lower Body Dressing: with min guard assist;with adaptive equipment;sit to/from stand(With LRAD for safety.) Pt Will Transfer to Toilet: bedside commode;with min assist;ambulating(With LRAD for safety.) Additional ADL Goal #1: Pt will independently verbalize a plan to implement learned ECS within daily routines upon DC.  OT Frequency: Min 2X/week   Barriers to D/C: Decreased caregiver support  Pt with limited caregiver support. Family not available 24/7.       Co-evaluation              AM-PAC OT "6 Clicks" Daily Activity     Outcome Measure Help from another person eating meals?: A Little Help  from another person taking care of personal grooming?: A Little Help from another person toileting, which includes using toliet, bedpan, or urinal?: A Lot Help from another person bathing (including washing, rinsing, drying)?: A Lot Help from another person to put on and taking off regular upper body clothing?: A Little Help from another person to put on and taking off regular lower body clothing?: A Lot 6 Click Score: 15   End of  Session Equipment Utilized During Treatment: Gait belt;Rolling walker;Oxygen Nurse Communication: Other (comment)(Vitals during activity. )  Activity Tolerance: Patient tolerated treatment well Patient left: in bed;with call bell/phone within reach;with bed alarm set;with family/visitor present  OT Visit Diagnosis: Unsteadiness on feet (R26.81);Other abnormalities of gait and mobility (R26.89);Muscle weakness (generalized) (M62.81)                Time: 7425-9563 OT Time Calculation (min): 43 min Charges:  OT General Charges $OT Visit: 1 Visit OT Evaluation $OT Eval Low Complexity: 1 Low OT Treatments $Self Care/Home Management : 23-37 mins  Rockney Ghee, M.S., OTR/L Ascom: [phone] 06/04/18, 1:13 PM

## 2018-06-04 NOTE — Progress Notes (Signed)
PHARMACY CONSULT NOTE - FOLLOW UP  Pharmacy Consult for Electrolyte Monitoring and Replacement   Recent Labs: Potassium (mmol/L)  Date Value  06/04/2018 3.4 (L)   Magnesium (mg/dL)  Date Value  16/10/960402/06/2018 2.1   Calcium (mg/dL)  Date Value  54/09/811902/07/2018 9.2   Albumin (g/dL)  Date Value  14/78/295601/26/2020 3.5   Phosphorus (mg/dL)  Date Value  21/30/865702/06/2018 4.4   Sodium (mmol/L)  Date Value  06/04/2018 143     Assessment: Patient is a 68yo female that is s/p cardiac arrest. Patient was initiated on hypothermia protocol with pharmacy consult to manage electrolytes.   On Furosemide 20mg  IV daily and spironolactone 25mg  daily.  Goal of Therapy:  Maintain electrolytes within range  Plan:  02/03- K= 3.4, will replace with 20meq po once today. Will check BMET with AM labs.

## 2018-06-04 NOTE — Progress Notes (Signed)
[employer] - [place] [facility]   PATIENT NAME: Desiree Mccall    MR#:  [id number]  DATE OF BIRTH:  09/10/1950  SUBJECTIVE:  CHIEF COMPLAINT:   Chief Complaint  Patient presents with  . Cardiac Arrest  Generalized weakness.  HR sustaining 150-170s. Daughter at bedside REVIEW OF SYSTEMS:  Review of Systems  Constitutional: Positive for malaise/fatigue. Negative for chills and fever.  HENT: Negative for sore throat.   Eyes: Negative for blurred vision and double vision.  Respiratory: Positive for shortness of breath. Negative for cough, hemoptysis, wheezing and stridor.   Cardiovascular: Positive for palpitations. Negative for chest pain, orthopnea and leg swelling.  Gastrointestinal: Negative for abdominal pain, blood in stool, diarrhea, melena, nausea and vomiting.  Genitourinary: Negative for dysuria, flank pain and hematuria.  Musculoskeletal: Negative for back pain and joint pain.  Skin: Negative for rash.  Neurological: Negative for dizziness, sensory change, focal weakness, seizures, loss of consciousness, weakness and headaches.  Endo/Heme/Allergies: Negative for polydipsia.  Psychiatric/Behavioral: Negative for depression. The patient is not nervous/anxious.     DRUG ALLERGIES:  [name] VITALS:  Blood pressure 100/72, pulse (!) 163, temperature 97.8 F (36.6 C), temperature source Tympanic, resp. rate 19, height 5\' 8"  (1.727 m), weight 105 kg, SpO2 92 %. PHYSICAL EXAMINATION:  Physical Exam Constitutional:      General: She is awake. She is not in acute distress.    Appearance: Normal appearance.     Comments: morbid obesity.  HENT:     Head: Normocephalic.     Mouth/Throat:     Mouth: Mucous membranes are dry.  Eyes:     General: No scleral icterus.    Conjunctiva/sclera: Conjunctivae normal.     Pupils: Pupils are equal, round, and reactive to light.  Neck:     Musculoskeletal: Normal range of motion and neck supple.   Vascular: No JVD.     Trachea: No tracheal deviation.  Cardiovascular:     Rate and Rhythm: Tachycardia present. Rhythm irregularly irregular.     Heart sounds: Normal heart sounds. No murmur. No gallop.   Pulmonary:     Effort: Pulmonary effort is normal. No respiratory distress.     Breath sounds: Normal breath sounds. No stridor. No wheezing, rhonchi or rales.  Abdominal:     General: Bowel sounds are normal. There is no distension.     Palpations: Abdomen is soft.     Tenderness: There is no abdominal tenderness. There is no guarding or rebound.     Comments: Diminished bowel sound.  Musculoskeletal: Normal range of motion.        General: No tenderness.     Right lower leg: No edema.     Left lower leg: No edema.  Skin:    Findings: No erythema or rash.  Neurological:     General: No focal deficit present.     Mental Status: She is alert and oriented to person, place, and time.     Cranial Nerves: No cranial nerve deficit.     Sensory: No sensory deficit.  Psychiatric:        Mood and Affect: Mood normal.        Behavior: Behavior is cooperative.    LABORATORY PANEL:  Female CBC Recent Labs  Lab 06/04/18 0442  WBC 9.4  HGB 11.4*  HCT 37.2  PLT 263   ------------------------------------------------------------------------------------------------------------------ Chemistries  Recent Labs  Lab 06/03/18 0501 06/04/18 0442  NA 141 143  K 3.9  3.4*  CL 99 99  CO2 38* 37*  GLUCOSE 179* 177*  BUN 30* 27*  CREATININE 0.52 0.46  CALCIUM 9.2 9.2  MG 2.1  --    RADIOLOGY:  No results found. ASSESSMENT AND PLAN:  [name]  is a 68 y.o. female with PMH Aortic dissection, CHF, DM and heart attack.  * A.fib with RVR status post AICD. - HR in 150-170s, start Amio drip per cardio - changed metoprolol to Coreg - on coumadin, Pharmacy to manage  * Cardiac arrest [facility]) -this was PEA arrest per EMS report, she received 1 round of epinephrine and CPR and obtained  ROSC.  She was intubated here in the ED post arrest and extubated in ICU on 2/2  * Cardiogenic shock. -resolved  * Acute on chronic systolic CHF EF 20 to 25%. - Continue Lasix,spironolactone, low-dose Coreg & Entresto. - Cardio planning for cath tomorrow  * Acute respiratory failure with hypoxia - remains extubated  * Hypokalemia: replete  * CAD (coronary artery disease) -continue Plavix. Cath tomorrow  * Diabetes mellitus type 2, noninsulin dependent (HCC) -sliding scale insulin coverage and Lantus 5 unit daily for now.  * Hypertension.  Spironolactone, Coreg, continue Lasix.  IV hydralazine PRN.  * Dehydration due to diuretics.  Follow-up BMP.  * Tobacco abuse.  Smoking cessation was counseled for 3 to 4 minutes.  The patient wants to quit.  * Generalized weakness.  Ambulate patient.   If HR doesn't slow down, I highly recommend transfer to ICU/Step-down considering her extensive medical history and recent extubation. She is at hight risk for cardio-resp failure and death.  She is critically sick and high risk for decompensation. D/w RN Dorna Mai  All the records are reviewed and case discussed with Care Management/Social Worker. Management plans discussed with the patient's daughter and they are in agreement.  CODE STATUS: Full Code  TOTAL TIME (critical care) TAKING CARE OF THIS PATIENT: 35 minutes.   More than 50% of the time was spent in counseling/coordination of care: YES  POSSIBLE D/C IN 2-3 DAYS, DEPENDING ON CLINICAL CONDITION.   Desiree Mccall M.D on 06/04/2018 at 5:13 PM  Between 7am to 6pm - Pager - [phone]  After 6pm go to [email or web address] - Therapist, nutritional Hospitalists

## 2018-06-04 NOTE — Progress Notes (Signed)
Ch visited pt as recommended by [facility]. Ch visited pt with daughter and Massage Therapist present. Pt reflected on the events the lead to her being hospitalized and how her heart stopped in spite of having a defibrillator and PACE maker. Pt started thanking God for allowing her to pull through another major health challenge. Daughter at bedside was very appreciative of the ch that supported them when the pt was being admitted. Pt and daughter were grateful of the visit. Ch allowed the nurse to administer meds and the pt to try to get some rest. F/u recommended.      06/04/18 1420  Clinical Encounter Type  Visited With Patient and family together  Visit Type Initial;Psychological support;Spiritual support;Social support  Referral From Chaplain  Consult/Referral To Chaplain  Spiritual Encounters  Spiritual Needs Emotional;Grief support  Stress Factors  Patient Stress Factors Major life changes;Loss of control;Exhausted  Family Stress Factors Exhausted;Family relationships;Major life changes

## 2018-06-04 NOTE — Progress Notes (Signed)
Nutrition Follow Up Note   DOCUMENTATION CODES:   Obesity unspecified  INTERVENTION:   Ensure Max protein supplement BID, each supplement provides 150kcal and 30g of protein.  MVI daily   NUTRITION DIAGNOSIS:   Inadequate oral intake related to inability to eat as evidenced by NPO status. - pt advanced to dysphagia 2 diet   GOAL:   Patient will meet greater than or equal to 90% of their needs  -progressing   MONITOR:   PO intake, Supplement acceptance, Labs, Weight trends, Skin, I & O's  ASSESSMENT:   68 year old female with PMHx of DM, CHF (EF 30-35%), hx aortic dissection s/p repair, hx MI 2010 who was admitted 1/26 after witnessed PEA arrest, received 1 round of epinephrine and CPR and then obtained ROSC, was intubated for severe hypoxic and hypercapnic respiratory failure and started on 36 C TTM.   Pt extubated 2/2; tolerated well. Pt seen by SLP and placed on a dysphagia 2 diet. Pt documented to be eating 50% of meals. RD will add supplements to help pt meet her estimated needs. Recommend continue MVI. Per chart, pt is weight stable since admit.   Medications reviewed and include: colace, lasix, plavix, pepcid, heparin, insulin, MVI, protonix, aldactone   Labs reviewed: K 3.4(L), BUN 27(H) P 4.4 wnl, Mg 2.1 wnl cbgs- 187, 195, 184 x 24 hrs  Diet Order:   Diet Order            DIET DYS 2 Room service appropriate? Yes; Fluid consistency: Thin  Diet effective now             EDUCATION NEEDS:   No education needs have been identified at this time  Skin:  Skin Assessment: Reviewed RN Assessment  Last BM:  1/30  Height:   Ht Readings from Last 1 Encounters:  05/27/18 5\' 8"  (1.727 m)   Weight:   Wt Readings from Last 1 Encounters:  06/04/18 105 kg   Ideal Body Weight:  63.6 kg  BMI:  Body mass index is 35.19 kg/m.  Estimated Nutritional Needs:   Kcal:  1800-2100kcal/day   Protein:  105-115g/day   Fluid:  1.8L/day   Betsey Holiday MS, RD,  LDN Pager #- [phone] Office#- [phone] After Hours Pager: [phone]

## 2018-06-05 ENCOUNTER — Encounter: Payer: Self-pay | Admitting: Emergency Medicine

## 2018-06-05 ENCOUNTER — Encounter
Admission: EM | Disposition: A | Discharge status: Other Acute Inpatient Hospital | Payer: Self-pay | Source: Home / Self Care | Attending: Internal Medicine

## 2018-06-05 HISTORY — PX: LEFT HEART CATH AND CORONARY ANGIOGRAPHY: CATH118249

## 2018-06-05 LAB — BASIC METABOLIC PANEL
Anion gap: 8 (ref 5–15)
Anion gap: 8 (ref 5–15)
BUN: 25 mg/dL — ABNORMAL HIGH (ref 8–23)
BUN: 22 mg/dL (ref 8–23)
CO2: 36 mmol/L — ABNORMAL HIGH (ref 22–32)
CO2: 34 mmol/L — ABNORMAL HIGH (ref 22–32)
Calcium: 8.7 mg/dL — ABNORMAL LOW (ref 8.9–10.3)
Calcium: 8.8 mg/dL — ABNORMAL LOW (ref 8.9–10.3)
Chloride: 97 mmol/L — ABNORMAL LOW (ref 98–111)
Chloride: 98 mmol/L (ref 98–111)
Creatinine, Ser: 0.59 mg/dL (ref 0.44–1.00)
Creatinine, Ser: 0.66 mg/dL (ref 0.44–1.00)
GFR calc Af Amer: >60 mL/min (ref >60)
GFR calc Af Amer: >60 mL/min (ref >60)
GFR calc non Af Amer: >60 mL/min (ref >60)
GFR calc non Af Amer: >60 mL/min (ref >60)
Glucose, Bld: 169 mg/dL — ABNORMAL HIGH (ref 70–99)
Glucose, Bld: 174 mg/dL — ABNORMAL HIGH (ref 70–99)
Potassium: 3.1 mmol/L — ABNORMAL LOW (ref 3.5–5.1)
Potassium: 3 mmol/L — ABNORMAL LOW (ref 3.5–5.1)
Sodium: 141 mmol/L (ref 135–145)
Sodium: 140 mmol/L (ref 135–145)

## 2018-06-05 LAB — PROTIME-INR
INR: 1.21
INR: 1.13
Prothrombin Time: 15.2 seconds (ref 11.4–15.2)
Prothrombin Time: 14.4 seconds (ref 11.4–15.2)

## 2018-06-05 LAB — CBC
HCT: 41.6 % (ref 36.0–46.0)
HEMATOCRIT: 40.6 % (ref 36.0–46.0)
Hemoglobin: 12.8 g/dL (ref 12.0–15.0)
Hemoglobin: 13.2 g/dL (ref 12.0–15.0)
MCH: 26.7 pg (ref 26.0–34.0)
MCH: 26.8 pg (ref 26.0–34.0)
MCHC: 31.5 g/dL (ref 30.0–36.0)
MCHC: 31.7 g/dL (ref 30.0–36.0)
MCV: 84.8 fL (ref 80.0–100.0)
MCV: 84.4 fL (ref 80.0–100.0)
Platelets: 277 10*3/uL (ref 150–400)
Platelets: 286 10*3/uL (ref 150–400)
RBC: 4.79 MIL/uL (ref 3.87–5.11)
RBC: 4.93 MIL/uL (ref 3.87–5.11)
RDW: 15.6 % — AB (ref 11.5–15.5)
RDW: 15.7 % — ABNORMAL HIGH (ref 11.5–15.5)
WBC: 8.6 10*3/uL (ref 4.0–10.5)
WBC: 8.2 10*3/uL (ref 4.0–10.5)
nRBC: 0 % (ref 0.0–0.2)
nRBC: 0 % (ref 0.0–0.2)

## 2018-06-05 LAB — MAGNESIUM: Magnesium: 2 mg/dL (ref 1.7–2.4)

## 2018-06-05 LAB — POTASSIUM: Potassium: 3.4 mmol/L — ABNORMAL LOW (ref 3.5–5.1)

## 2018-06-05 LAB — GLUCOSE, CAPILLARY
Glucose-Capillary: 150 mg/dL — ABNORMAL HIGH (ref 70–99)
Glucose-Capillary: 220 mg/dL — ABNORMAL HIGH (ref 70–99)

## 2018-06-05 SURGERY — LEFT HEART CATH AND CORONARY ANGIOGRAPHY
Anesthesia: Moderate Sedation

## 2018-06-05 MED ORDER — HYDRALAZINE HCL 20 MG/ML IJ SOLN: 10.0000 mg | INTRAMUSCULAR | Status: DC | PRN

## 2018-06-05 MED ORDER — SODIUM CHLORIDE 0.9% FLUSH: 3.0000 mL | Freq: Two times a day (BID) | INTRAVENOUS | Status: DC

## 2018-06-05 MED ORDER — SACUBITRIL-VALSARTAN 24-26 MG PO TABS: 1.0000 | ORAL_TABLET | Freq: Two times a day (BID) | ORAL | Status: DC

## 2018-06-05 MED ORDER — VERAPAMIL HCL 2.5 MG/ML IV SOLN
INTRAVENOUS | Status: AC
  Filled 2018-06-05: qty 2

## 2018-06-05 MED ORDER — SODIUM CHLORIDE 0.9 % IV SOLN: 250.0000 mL | INTRAVENOUS | 0 refills | Status: DC | PRN

## 2018-06-05 MED ORDER — INSULIN GLARGINE 100 UNIT/ML ~~LOC~~ SOLN: 5.0000 [IU] | Freq: Every day | SUBCUTANEOUS | 11 refills | Status: DC

## 2018-06-05 MED ORDER — INSULIN ASPART 100 UNIT/ML ~~LOC~~ SOLN: 0.0000 [IU] | Freq: Three times a day (TID) | SUBCUTANEOUS | 11 refills | Status: DC

## 2018-06-05 MED ORDER — MIDAZOLAM HCL 2 MG/2ML IJ SOLN
INTRAMUSCULAR | Status: DC | PRN
  Administered 2018-06-05: 1 mg via INTRAVENOUS

## 2018-06-05 MED ORDER — LIDOCAINE HCL (PF) 1 % IJ SOLN
INTRAMUSCULAR | Status: DC | PRN
  Administered 2018-06-05: 2 mL

## 2018-06-05 MED ORDER — FUROSEMIDE 10 MG/ML IJ SOLN: 20.0000 mg | Freq: Every day | INTRAMUSCULAR | 0 refills | Status: DC

## 2018-06-05 MED ORDER — SODIUM CHLORIDE 0.9 % WEIGHT BASED INFUSION
1.0000 mL/kg/h | INTRAVENOUS | Status: DC
  Administered 2018-06-05: 1 mL/kg/h via INTRAVENOUS

## 2018-06-05 MED ORDER — POTASSIUM CHLORIDE 10 MEQ/100ML IV SOLN
10.0000 meq | INTRAVENOUS | Status: AC
  Administered 2018-06-05 (×3): 10 meq via INTRAVENOUS
  Filled 2018-06-05 (×3): qty 100

## 2018-06-05 MED ORDER — POTASSIUM CHLORIDE CRYS ER 20 MEQ PO TBCR: 20.0000 meq | EXTENDED_RELEASE_TABLET | ORAL | Status: DC

## 2018-06-05 MED ORDER — ENSURE MAX PROTEIN PO LIQD: 11.0000 [oz_av] | Freq: Two times a day (BID) | ORAL | Status: DC

## 2018-06-05 MED ORDER — SODIUM CHLORIDE 0.9 % IV SOLN: 250.0000 mL | INTRAVENOUS | Status: DC | PRN

## 2018-06-05 MED ORDER — IPRATROPIUM-ALBUTEROL 0.5-2.5 (3) MG/3ML IN SOLN: 3.0000 mL | Freq: Three times a day (TID) | RESPIRATORY_TRACT | Status: DC

## 2018-06-05 MED ORDER — ONDANSETRON HCL 4 MG/2ML IJ SOLN: 4.0000 mg | Freq: Four times a day (QID) | INTRAMUSCULAR | 0 refills | Status: DC | PRN

## 2018-06-05 MED ORDER — FAMOTIDINE 20 MG PO TABS: 20.0000 mg | ORAL_TABLET | Freq: Two times a day (BID) | ORAL | Status: DC

## 2018-06-05 MED ORDER — SODIUM CHLORIDE 0.9% FLUSH: 10.0000 mL | INTRAVENOUS | Status: DC | PRN

## 2018-06-05 MED ORDER — INSULIN ASPART 100 UNIT/ML ~~LOC~~ SOLN: 0.0000 [IU] | Freq: Every day | SUBCUTANEOUS | 11 refills | Status: DC

## 2018-06-05 MED ORDER — POTASSIUM CHLORIDE CRYS ER 20 MEQ PO TBCR: 30.0000 meq | EXTENDED_RELEASE_TABLET | ORAL | Status: DC

## 2018-06-05 MED ORDER — HEPARIN SODIUM (PORCINE) 1000 UNIT/ML IJ SOLN
INTRAMUSCULAR | Status: AC
  Filled 2018-06-05: qty 1

## 2018-06-05 MED ORDER — ACETAMINOPHEN 325 MG PO TABS: 650.0000 mg | ORAL_TABLET | ORAL | Status: DC | PRN

## 2018-06-05 MED ORDER — POTASSIUM CHLORIDE CRYS ER 20 MEQ PO TBCR
20.0000 meq | EXTENDED_RELEASE_TABLET | ORAL | Status: DC
  Administered 2018-06-05: 20 meq via ORAL
  Filled 2018-06-05: qty 1

## 2018-06-05 MED ORDER — ORAL CARE MOUTH RINSE: 15.0000 mL | Freq: Two times a day (BID) | OROMUCOSAL | 0 refills | Status: DC

## 2018-06-05 MED ORDER — FENTANYL CITRATE (PF) 100 MCG/2ML IJ SOLN
INTRAMUSCULAR | Status: AC
  Filled 2018-06-05: qty 2

## 2018-06-05 MED ORDER — FENTANYL CITRATE (PF) 100 MCG/2ML IJ SOLN
INTRAMUSCULAR | Status: DC | PRN
  Administered 2018-06-05: 25 ug via INTRAVENOUS

## 2018-06-05 MED ORDER — SPIRONOLACTONE 25 MG PO TABS: 25.0000 mg | ORAL_TABLET | Freq: Every day | ORAL | Status: DC

## 2018-06-05 MED ORDER — CARVEDILOL 6.25 MG PO TABS: 6.2500 mg | ORAL_TABLET | Freq: Two times a day (BID) | ORAL | Status: DC

## 2018-06-05 MED ORDER — MIDAZOLAM HCL 2 MG/2ML IJ SOLN
INTRAMUSCULAR | Status: AC
  Filled 2018-06-05: qty 2

## 2018-06-05 MED ORDER — ALBUTEROL SULFATE (2.5 MG/3ML) 0.083% IN NEBU: 2.5000 mg | INHALATION_SOLUTION | RESPIRATORY_TRACT | 12 refills | Status: DC | PRN

## 2018-06-05 MED ORDER — DOCUSATE SODIUM 100 MG PO CAPS: 100.0000 mg | ORAL_CAPSULE | Freq: Every day | ORAL | 0 refills | Status: DC

## 2018-06-05 MED ORDER — ATORVASTATIN CALCIUM 40 MG PO TABS: 40.0000 mg | ORAL_TABLET | Freq: Every day | ORAL | Status: AC

## 2018-06-05 MED ORDER — HEPARIN SODIUM (PORCINE) 5000 UNIT/ML IJ SOLN: 5000.0000 [IU] | Freq: Three times a day (TID) | INTRAMUSCULAR | Status: DC

## 2018-06-05 MED ORDER — HEPARIN SODIUM (PORCINE) 1000 UNIT/ML IJ SOLN
INTRAMUSCULAR | Status: DC | PRN
  Administered 2018-06-05: 5200 [IU] via INTRAVENOUS

## 2018-06-05 MED ORDER — MENTHOL 3 MG MT LOZG: 1.0000 | LOZENGE | OROMUCOSAL | 12 refills | Status: DC | PRN

## 2018-06-05 MED ORDER — SODIUM CHLORIDE 0.9% FLUSH: 10.0000 mL | Freq: Two times a day (BID) | INTRAVENOUS | Status: DC

## 2018-06-05 MED ORDER — BUDESONIDE 0.5 MG/2ML IN SUSP: 0.5000 mg | Freq: Two times a day (BID) | RESPIRATORY_TRACT | 12 refills | Status: DC

## 2018-06-05 MED ORDER — SENNOSIDES-DOCUSATE SODIUM 8.6-50 MG PO TABS: 1.0000 | ORAL_TABLET | Freq: Two times a day (BID) | ORAL | Status: DC | PRN

## 2018-06-05 MED ORDER — PANTOPRAZOLE SODIUM 40 MG PO TBEC: 40.0000 mg | DELAYED_RELEASE_TABLET | Freq: Every day | ORAL | Status: DC

## 2018-06-05 MED ORDER — LIDOCAINE HCL (PF) 1 % IJ SOLN
INTRAMUSCULAR | Status: AC
  Filled 2018-06-05: qty 30

## 2018-06-05 MED ORDER — BISACODYL 10 MG RE SUPP: 10.0000 mg | Freq: Every day | RECTAL | 0 refills | Status: DC | PRN

## 2018-06-05 MED ORDER — FLUOXETINE HCL 20 MG PO CAPS
40.0000 mg | ORAL_CAPSULE | Freq: Every day | ORAL | Status: DC
  Administered 2018-06-05: 40 mg via ORAL
  Filled 2018-06-05: qty 2

## 2018-06-05 MED ORDER — SODIUM CHLORIDE 0.9% FLUSH: 3.0000 mL | INTRAVENOUS | Status: DC | PRN

## 2018-06-05 MED ORDER — CLOPIDOGREL BISULFATE 75 MG PO TABS: 75.0000 mg | ORAL_TABLET | Freq: Every morning | ORAL | Status: DC

## 2018-06-05 MED ORDER — ASPIRIN 81 MG PO CHEW: 81.0000 mg | CHEWABLE_TABLET | ORAL | Status: DC

## 2018-06-05 MED ORDER — SODIUM CHLORIDE 0.9 % WEIGHT BASED INFUSION: 1.0000 mL/kg/h | INTRAVENOUS | Status: DC

## 2018-06-05 MED ORDER — AMIODARONE HCL IN DEXTROSE 360-4.14 MG/200ML-% IV SOLN: 30.0000 mg/h | INTRAVENOUS | Status: DC

## 2018-06-05 MED ORDER — ONDANSETRON HCL 4 MG/2ML IJ SOLN: 4.0000 mg | Freq: Four times a day (QID) | INTRAMUSCULAR | Status: DC | PRN

## 2018-06-05 MED ORDER — SODIUM CHLORIDE 0.9% FLUSH
3.0000 mL | INTRAVENOUS | Status: DC | PRN
  Administered 2018-06-05: 3 mL via INTRAVENOUS
  Filled 2018-06-05: qty 3

## 2018-06-05 MED ORDER — ADULT MULTIVITAMIN W/MINERALS CH: 1.0000 | ORAL_TABLET | Freq: Every day | ORAL | Status: DC

## 2018-06-05 SURGICAL SUPPLY — 10 items
CATH INFINITI 5 FR JL3.5 (CATHETERS) ×3 IMPLANT
CATH INFINITI 5FR ANG PIGTAIL (CATHETERS) ×3 IMPLANT
CATH INFINITI JR4 5F (CATHETERS) ×3 IMPLANT
DEVICE RAD TR BAND REGULAR (VASCULAR PRODUCTS) ×3 IMPLANT
GLIDESHEATH SLEND SS 6F .021 (SHEATH) ×3 IMPLANT
KIT MANI 3VAL PERCEP (MISCELLANEOUS) ×3 IMPLANT
PACK ANGIOGRAPHY (CUSTOM PROCEDURE TRAY) ×3 IMPLANT
WIRE HITORQ VERSACORE ST 145CM (WIRE) ×3 IMPLANT
WIRE NITINOL .018 (WIRE) ×3 IMPLANT
WIRE ROSEN-J .035X260CM (WIRE) ×3 IMPLANT

## 2018-06-05 NOTE — Progress Notes (Signed)
Pharmacy Electrolyte Monitoring Consult:  Pharmacy consulted to assist in monitoring and replacing electrolytes in this 67 y.o. female admitted on 05/27/2018 with Cardiac Arrest   Labs:  Sodium (mmol/L)  Date Value  06/05/2018 140   Potassium (mmol/L)  Date Value  06/05/2018 3.4 (L)   Magnesium (mg/dL)  Date Value  27/07/5007 2.0   Phosphorus (mg/dL)  Date Value  38/18/2993 4.4   Calcium (mg/dL)  Date Value  71/69/6789 8.8 (L)   Albumin (g/dL)  Date Value  38/01/1750 3.5    Assessment/Plan: Patient ordered potassium IV Q1hr x 3 doses this am. Potassium level is 3.4. Will give 20 mEq x 2.  Will obtain BMP/Magnesium with am labs.   Will replace for goal potassium ~4 and goal magnesium ~2.   Pharmacy will continue to monitor and adjust per consult.   Ronnald Ramp, PharmD, BCPS 06/05/2018 7:05 PM

## 2018-06-05 NOTE — Progress Notes (Signed)
Infiltration noted to pt's IV in left AC, amiodarone was running through line.  Infusion stopped and pharmacy called, per pharmacy, line aspirated to removed drug, only few drops able to be aspirated, line removed, and dry warm compress applied.  Pt instructed to let cath lab staff know if compress becomes painful.  Cath lab staff informed of complication to site.

## 2018-06-05 NOTE — Progress Notes (Signed)
Pastoral Care Visit    06/05/18 1600  Clinical Encounter Type  Visited With Patient and family together  Visit Type Follow-up;Critical Care  Referral From Family  Consult/Referral To Chaplain  Spiritual Encounters  Spiritual Needs Emotional  Stress Factors  Patient Stress Factors Health changes  Family Stress Factors Health changes   Pt was awake, sitting up, and happily interacting with family. When pt laughs it causes her significant chest pain but she says, "I don't mind."  Family and pt are filled with thankfulness and rejoicing about health improvements.  Pt shared about past medical challenges, and about her family and fur babies. Chap visit was more social in nature, listened to pt stories, encouraged family and pt.  Milinda Antis, [address]

## 2018-06-05 NOTE — Progress Notes (Addendum)
CRITICAL CARE NOTE  CC  follow up respiratory failure  SUBJECTIVE Patient remains critically ill Prognosis is guarded     SIGNIFICANT EVENTS -clinically stable    BP 132/62   Pulse 71   Temp 97.8 F (36.6 C) (Oral)   Resp 15   Ht 5\' 8"  (1.727 m)   Wt 103.9 kg   SpO2 94%   BMI 34.83 kg/m    REVIEW OF SYSTEMS  Review of Systems  Constitutional: Negative for chills, fever and weight loss.  HENT: Negative for hearing loss and nosebleeds.   Eyes: Negative for double vision and discharge.  Respiratory: Negative for cough and sputum production.   Cardiovascular: Negative for leg swelling.  Gastrointestinal: Negative for blood in stool.  Genitourinary: Negative for dysuria.  Musculoskeletal: Positive for back pain.  Skin: Negative for rash.  Neurological: Positive for weakness. Negative for dizziness.  Endo/Heme/Allergies: Negative for polydipsia.  Psychiatric/Behavioral: Negative for suicidal ideas.     PHYSICAL EXAMINATION:  GENERAL:NAD HEAD: Normocephalic, atraumatic.  EYES: Pupils equal, round, reactive to light.  No scleral icterus.  MOUTH: Moist mucosal membrane. NECK: Supple. No thyromegaly. No nodules. No JVD.  PULMONARY: Clear to auscultation b/l CARDIOVASCULAR: S1 and S2. Regular rate and rhythm. No murmurs, rubs, or gallops.  GASTROINTESTINAL: Soft, nontender, -distended. No masses. Positive bowel sounds. No hepatosplenomegaly.  MUSCULOSKELETAL: No swelling, clubbing, or edema.  NEUROLOGIC: obtunded, GCS 14 SKIN:intact,warm,dry      Indwelling Urinary Catheter continued, requirement due to   Reason to continue Indwelling Urinary Catheter for strict Intake/Output monitoring for hemodynamic instability   Central Line continued, requirement due to   Reason to continue [employer] Monitoring of central venous pressure or other hemodynamic parameters   Ventilator continued, requirement due to, resp failure    Ventilator Sedation RASS 0 to -2      ASSESSMENT AND PLAN SYNOPSIS   Status post cardiac arrest.  Patient with witnessed PEA arrest, status post targeted temperature management.  Felt to be primary cardiac event.  Echocardiogram reveals severe LV dysfunction with ejection fraction of 20 to 25%.  Prior history of coronary artery disease. Developed atrial fibrillation with rvr amiodarone gtt initiated, continue po carvedilol and atorvastatin, Cards following recommending cardiac catheterization prior to discharge scheduled for 06/05/2018  Hypoxemic respiratory failure.          Severe Hypoxic and Hypercapnic Respiratory Failure  Patient was successfully extubated 06/01/2018.  Continue supplemental O2 for dyspnea and/or hypoxia, nebulized steroids, and bronchodilator therapy -wean O2 goal 90-92%   CARDIAC FAILURE-  Afib with RVR - plan for ablation with cardiology -oxygen as needed -follow up cardiac enzymes as indicated Chronic systolic heart failure with reduced LVEF ~20-25%: Continue lasix 20mg  daily. Cardiology following -ICU monitoring  Hypertension : Continue entresto and prn hydralazine  Renal Failure-most likely due to ATN -follow chem 7 -Prerenal azotemia - resolved  NEUROLOGY - minimal sedation to achieve a RASS goal: -1   Anemia. - normocytic - resolved   GI/Nutrition GI PROPHYLAXIS as indicated DIET-->TF's as tolerated Constipation protocol as indicated  ENDO - ICU hypoglycemic\Hyperglycemia protocol -check FSBS per protocol   ELECTROLYTES -Hypokalemia - repleting KCL 30IV over 3hr -replace as needed -pharmacy consultation and following   DVT/GI PRX ordered TRANSFUSIONS AS NEEDED MONITOR FSBS ASSESS the need for LABS as needed   Critical Care Time devoted to patient care services described in this note is 35 minutes.     Overall, patient is critically ill, prognosis is guarded.  Patient with Multiorgan  failure and at high risk for cardiac arrest and death.     Vida Rigger, M.D.  Pulmonary & Critical Care Medicine

## 2018-06-05 NOTE — Progress Notes (Signed)
Pharmacy Electrolyte Monitoring Consult:  Pharmacy consulted to assist in monitoring and replacing electrolytes in this 68 y.o. female admitted on 05/27/2018 with Cardiac Arrest   Labs:  Sodium (mmol/L)  Date Value  06/05/2018 140   Potassium (mmol/L)  Date Value  06/05/2018 3.0 (L)   Magnesium (mg/dL)  Date Value  99/37/1696 2.0   Phosphorus (mg/dL)  Date Value  78/93/8101 4.4   Calcium (mg/dL)  Date Value  75/01/2584 8.8 (L)   Albumin (g/dL)  Date Value  27/78/2423 3.5    Assessment/Plan: Patient ordered potassium IV Q1hr x 3 doses this am.   Will order follow up potassium for 1800. Will obtain BMP/Magnesium with am labs.   Will replace for goal potassium ~4 and goal magnesium ~2.   Pharmacy will continue to monitor and adjust per consult.   Simpson,Michael L 06/05/2018 4:36 PM

## 2018-06-05 NOTE — Progress Notes (Signed)
I spoke to daughter twice from 4;30, and 5;30 to 6;10, at length, about cardiac cath, and transfer to [facility], she has now understood why she need to go and have AICD, evaluated as it did not fire during arrest;;

## 2018-06-05 NOTE — Progress Notes (Signed)
[employer] [facility] [facility] [place] [facility]   PATIENT NAME: Desiree Mccall    MR#:  [id number]  DATE OF BIRTH:  June 21, 1950  SUBJECTIVE:  CHIEF COMPLAINT:   Chief Complaint  Patient presents with  . Cardiac Arrest  no complaints s/p cath REVIEW OF SYSTEMS:  Review of Systems  Constitutional: Positive for malaise/fatigue. Negative for chills and fever.  HENT: Negative for sore throat.   Eyes: Negative for blurred vision and double vision.  Respiratory: Negative for cough, hemoptysis, wheezing and stridor.   Cardiovascular: Negative for chest pain, orthopnea and leg swelling.  Gastrointestinal: Negative for abdominal pain, blood in stool, diarrhea, melena, nausea and vomiting.  Genitourinary: Negative for dysuria, flank pain and hematuria.  Musculoskeletal: Negative for back pain and joint pain.  Skin: Negative for rash.  Neurological: Negative for dizziness, sensory change, focal weakness, seizures, loss of consciousness, weakness and headaches.  Endo/Heme/Allergies: Negative for polydipsia.  Psychiatric/Behavioral: Negative for depression. The patient is not nervous/anxious.     DRUG ALLERGIES:  [name] VITALS:  Blood pressure (!) 151/120, pulse 88, temperature 98.1 F (36.7 C), temperature source Oral, resp. rate (!) 29, height 5\' 8"  (1.727 m), weight 103.9 kg, SpO2 97 %. PHYSICAL EXAMINATION:  Physical Exam Constitutional:      General: She is awake. She is not in acute distress.    Appearance: Normal appearance.     Comments: morbid obesity.  HENT:     Head: Normocephalic.     Mouth/Throat:     Mouth: Mucous membranes are dry.  Eyes:     General: No scleral icterus.    Conjunctiva/sclera: Conjunctivae normal.     Pupils: Pupils are equal, round, and reactive to light.  Neck:     Musculoskeletal: Normal range of motion and neck supple.     Vascular: No JVD.     Trachea: No tracheal deviation.  Cardiovascular:     Rate and Rhythm: Tachycardia  present. Rhythm irregularly irregular.     Heart sounds: Normal heart sounds. No murmur. No gallop.   Pulmonary:     Effort: Pulmonary effort is normal. No respiratory distress.     Breath sounds: Normal breath sounds. No stridor. No wheezing, rhonchi or rales.  Abdominal:     General: Bowel sounds are normal. There is no distension.     Palpations: Abdomen is soft.     Tenderness: There is no abdominal tenderness. There is no guarding or rebound.     Comments: Diminished bowel sound.  Musculoskeletal: Normal range of motion.        General: No tenderness.     Right lower leg: No edema.     Left lower leg: No edema.  Skin:    Findings: No erythema or rash.  Neurological:     General: No focal deficit present.     Mental Status: She is alert and oriented to person, place, and time.     Cranial Nerves: No cranial nerve deficit.     Sensory: No sensory deficit.  Psychiatric:        Mood and Affect: Mood normal.        Behavior: Behavior is cooperative.    LABORATORY PANEL:  Female CBC Recent Labs  Lab 06/05/18 0837  WBC 8.2  HGB 13.2  HCT 41.6  PLT 286   ------------------------------------------------------------------------------------------------------------------ Chemistries  Recent Labs  Lab 06/05/18 0426 06/05/18 0837  NA 141 140  K 3.1* 3.0*  CL 97* 98  CO2 36* 34*  GLUCOSE 169* 174*  BUN 25* 22  CREATININE 0.59 0.66  CALCIUM 8.7* 8.8*  MG 2.0  --    RADIOLOGY:  No results found. ASSESSMENT AND PLAN:  Desiree Mccall  is a 68 y.o. female with PMH Aortic dissection, CHF, DM and heart attack.  * A.fib with RVR status post AICD. -  Amio drip per cardio, needed transfer to ICU overnight as HR still uncontrolled - continue Coreg - on coumadin, Pharmacy to manage  * CAD s/p cath - cardiac catheterization today showed ejection fraction of approximately 20% with dilated LV and stent in the mid LAD having no significant restenosis.  RCA had diffuse disease from  proximal to midportion with proximal lesion about 70%. Per Dr Welton FlakesKhan - Probably needs PCI of the proximal LAD but [facility].   * Cardiac arrest [facility](HCC) -this was PEA arrest per EMS report, she received 1 round of epinephrine and CPR and obtained ROSC.  She was intubated here in the ED post arrest and extubated in ICU on 2/2  * Cardiogenic shock. -resolved  * Acute on chronic systolic CHF EF 20 to 25%. - Continue Lasix,spironolactone, low-dose Coreg & Entresto.  * Acute respiratory failure with hypoxia - remains extubated  * Hypokalemia: replete  * CAD (coronary artery disease) -continue Plavix. Cath tomorrow  * Diabetes mellitus type 2, noninsulin dependent (HCC) -sliding scale insulin coverage and Lantus 5 unit daily for now.  * Hypertension.  Spironolactone, Coreg, continue Lasix.  IV hydralazine PRN.  * Dehydration due to diuretics.  Follow-up BMP.  * Tobacco abuse.  Smoking cessation was counseled for 3 to 4 minutes.  The patient wants to quit.  * Generalized weakness.  Ambulate patient.     All the records are reviewed and case discussed with Care Management/Social Worker. Management plans discussed with ICU nursing and they are in agreement.  CODE STATUS: Full Code  TOTAL TIME TAKING CARE OF THIS PATIENT: 15 minutes.   More than 50% of the time was spent in counseling/coordination of care: Gwendolyn LimaYES     Farley Crooker M.D on 06/05/2018 at 6:43 PM  Between 7am to 6pm - Pager - [phone]  After 6pm go to [email or web address] - Therapist, nutritionalpassword EPAS [facility]  [facility] [place] [facility]

## 2018-06-05 NOTE — Progress Notes (Addendum)
[facility] transport here to transfer patient.

## 2018-06-05 NOTE — Progress Notes (Signed)
Report given to [employer] and Merchant navy officer at [employer].  Patien tto go to room 7106- Dr. Twana First.  Per [facility] transport- will call back with ETA.

## 2018-06-05 NOTE — Progress Notes (Signed)
Dr. Bascom Levels, Narda Amber, has accepted the patient transfer center, [phone], [phone]

## 2018-06-05 NOTE — Progress Notes (Signed)
PT Cancellation Note  Patient Details Name: Desiree Mccall MRN: [id number] DOB: [birth year]   Cancelled Treatment:    Reason Eval/Treat Not Completed: Medical issues which prohibited therapy.  Pt transferred to CCU 06/04/18.  D/t pt transferring to higher level of care, per PT protocol require new PT consult in order to continue therapy (will discontinue current PT order d/t this).  Please re-consult PT when pt is medically appropriate to participate in PT.  Hendricks Limes, PT 06/05/18, 10:18 AM [phone]

## 2018-06-05 NOTE — Progress Notes (Signed)
SLP Cancellation Note  Patient Details Name: Desiree Mccall MRN: [id number] DOB: 02/18/1951   Cancelled treatment:       Reason Eval/Treat Not Completed: Patient at procedure or test/unavailable(chart reviewed.). Pt had a cardiac catheterization today which showed ejection fraction of approximately 20% today. ST services will f/u tomorrow w/ toleration of diet post initial BSE. Recommend continue w/ aspiration precautions w/ any oral intake.    Jerilynn SomKatherine Watson, MS, CCC-SLP Watson,Katherine 06/05/2018, 6:03 PM

## 2018-06-05 NOTE — Progress Notes (Signed)
OT Cancellation Note  Patient Details Name: Desiree Mccall MRN: [id number] DOB: 21-Sep-1950   Cancelled Treatment:    Reason Eval/Treat Not Completed: Medical issues which prohibited therapy. Pt transferred to CCU 06/04/18.  D/t pt transferring to higher level of care, per OT protocol require new OT consult in order to continue therapy (will discontinue current OT order d/t this).  Please re-consult OT when pt is medically appropriate to participate in OT.  Richrd Prime, MPH, MS, OTR/L ascom [phone] 06/05/18, 10:21 AM

## 2018-06-05 NOTE — Progress Notes (Signed)
Patient left with [facility] ground transport. Family member Mitzi Davenport called to notify of transfer, room number and contact number for unit.

## 2018-06-05 NOTE — Progress Notes (Signed)
SUBJECTIVE: Patient denies any chest pain or shortness of breath   Vitals:   06/05/18 1330 06/05/18 1345 06/05/18 1400 06/05/18 1416  BP: (!) 143/75 (!) 149/82 (!) 142/80   Pulse: 75 88 82 85  Resp: (!) 21 20 15 16   Temp:      TempSrc:      SpO2: 100% 97% 93% 99%  Weight:      Height:        Intake/Output Summary (Last 24 hours) at 06/05/2018 1459 Last data filed at 06/05/2018 1347 Gross per 24 hour  Intake 828.66 ml  Output 900 ml  Net -71.34 ml    LABS: Basic Metabolic Panel: Recent Labs    06/03/18 0501  06/05/18 0426 06/05/18 0837  NA 141   < > 141 140  K 3.9   < > 3.1* 3.0*  CL 99   < > 97* 98  CO2 38*   < > 36* 34*  GLUCOSE 179*   < > 169* 174*  BUN 30*   < > 25* 22  CREATININE 0.52   < > 0.59 0.66  CALCIUM 9.2   < > 8.7* 8.8*  MG 2.1  --  2.0  --   PHOS 4.4  --   --   --    < > = values in this interval not displayed.   Liver Function Tests: No results for input(s): AST, ALT, ALKPHOS, BILITOT, PROT, ALBUMIN in the last 72 hours. No results for input(s): LIPASE, AMYLASE in the last 72 hours. CBC: Recent Labs    06/03/18 0501  06/05/18 0426 06/05/18 0837  WBC 11.4*   < > 8.6 8.2  NEUTROABS 10.1*  --   --   --   HGB 10.9*   < > 12.8 13.2  HCT 36.0   < > 40.6 41.6  MCV 88.9   < > 84.8 84.4  PLT 235   < > 277 286   < > = values in this interval not displayed.   Cardiac Enzymes: No results for input(s): CKTOTAL, CKMB, CKMBINDEX, TROPONINI in the last 72 hours. BNP: Invalid input(s): POCBNP D-Dimer: No results for input(s): DDIMER in the last 72 hours. Hemoglobin A1C: Recent Labs    06/03/18 0501  HGBA1C 7.0*   Fasting Lipid Panel: No results for input(s): CHOL, HDL, LDLCALC, TRIG, CHOLHDL, LDLDIRECT in the last 72 hours. Thyroid Function Tests: No results for input(s): TSH, T4TOTAL, T3FREE, THYROIDAB in the last 72 hours.  Invalid input(s): FREET3 Anemia Panel: No results for input(s): VITAMINB12, FOLATE, FERRITIN, TIBC, IRON, RETICCTPCT in  the last 72 hours.   PHYSICAL EXAM General: Well developed, well nourished, in no acute distress HEENT:  Normocephalic and atramatic Neck:  No JVD.  Lungs: Clear bilaterally to auscultation and percussion. Heart: HRRR . Normal S1 and S2 without gallops or murmurs.  Abdomen: Bowel sounds are positive, abdomen soft and non-tender  Msk:  Back normal, normal gait. Normal strength and tone for age. Extremities: No clubbing, cyanosis or edema.   Neuro: Alert and oriented X 3. Psych:  Good affect, responds appropriately  TELEMETRY: Sinus rhythm  ASSESSMENT AND PLAN: Status post cardiac arrest with a history of coronary artery disease status post PCI of mid LAD diabetes and severe LV dysfunction.  Patient underwent cardiac catheterization today which showed ejection fraction of approximately 20% with dilated LV and stent in the mid LAD having no significant restenosis.  RCA had diffuse disease from proximal to midportion with proximal lesion about 70%.  Probably needs PCI of the proximal LAD but will defer to [facility] cardiology and [facility].  Probably proximal RCA lesion is not the cause of cardiac arrest and this appears to be electrical problem and will transport to [facility].  I called the [facility] transfer center and patient will be transferred to [facility].  Cardiac cath films will be sent to them by [phone] by Internet. Royann Shivers, MD, is her [facility], [phone] Principal Problem:   Cardiac arrest [facility]) Active Problems:   CAD (coronary artery disease)   Diabetes mellitus type 2, noninsulin dependent (HCC)   Chronic systolic CHF (congestive heart failure) (HCC)    Adrian Blackwater A, MD, [facility] 06/05/2018 2:59 PM

## 2018-06-05 NOTE — Discharge Summary (Signed)
Physician Discharge Summary  Patient ID: Desiree Mccall MRN: [id number] DOB/AGE: 1950-12-15 68 y.o.  Admit date: 05/27/2018 Discharge date: 06/05/2018    Discharge Diagnoses:  Cardiac arrest Acute Hypoxic and Hypercapnic Respiratory Failure A-fib with RVR Acute on Chronic Systolic CHF Hypertension AKI                                                                     DISCHARGE PLAN BY DIAGNOSIS      Severe Hypoxic and Hypercapnic Respiratory Failure Patient was successfully extubated 06/01/2018. Continue supplemental O2 for dyspnea and/or hypoxia, nebulized steroids, and bronchodilator therapy -wean O2 goal 90-92%   CARDIAC FAILURE-  Afib with RVR - plan for ablation with cardiology -oxygen as needed -follow up cardiac enzymes as indicated Chronic systolic heart failure with reduced LVEF ~20-25%: Continue lasix 85m daily. Cardiology following -ICU monitoring  Hypertension : Continueentresto and prn hydralazine  Renal Failure-most likely due to ATN -follow chem 7 -Prerenal azotemia - resolved  NEUROLOGY - minimal sedation to achieve a RASS goal: -1   Anemia. - normocytic - resolved   GI/Nutrition GI PROPHYLAXIS as indicated DIET-->TF's as tolerated Constipation protocol as indicated  ENDO - ICU hypoglycemic\Hyperglycemia protocol -check FSBS per protocol   ELECTROLYTES -Hypokalemia - repleting KCL 30IV over 3hr -replace as needed -pharmacy consultation and following   DVT/GI PRX ordered TRANSFUSIONS AS NEEDED MONITOR FSBS ASSESS the need for LABS as needed                      DISCHARGE SUMMARY   This is a 68year old female with a medical history as indicated below who presented to the ED via EMS following a witnessed cardiac arrest at home.  History is obtained from ED records as patient is currently intubated and sedated.  Per ED records, EMS was called by the patient.  When EMS arrived, patient came to the door, open the  door and was noted to be "purple".  She then became unresponsive and pulseless.  Initial rhythm per EMS was PEA.  She received 1 round of CPR with 1 dose of epinephrine with return of spontaneous circulation.  Upon arrival in the ED, patient was agonal and hence was intubated.  Her ED work-up showed patchy infiltrates in bilateral upper lobes on chest x-ray.  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  CT also shows fractures of the bilateral anterior ribs due to CPR and large amount of stool in the colon.  Stat CT head shows no acute changes. She was given Lasix and started on broad-spectrum antibiotics.  She is being admitted to the ICU for targeted temperature management. 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  [facility]  She had a left heart catheterization on 06/07/2017.  Report is unavailable.  She also had a carotid Doppler in January 2019 and it showed a 50% stenosis in the right ICA and 50 to 69% stenosis in the left ICA.  Her  nuclear medicine stress test in January 2019 showed an estimated left ventricular ejection fraction of 32%.  Her last 2D echo was on May 19, 2017 and showed a left ventricular ejection fraction of 30%.  Status post targeted temperature management and extubation. Felt to be primary cardiac event. Echocardiogram reveals severe LV dysfunction with ejection fraction of 20 to 25%. Prior history of coronary artery disease. Developed atrial fibrillation with rvr amiodarone gtt initiated, continue po carvedilol and atorvastatin,Cards followingrecommending cardiac catheterization prior to dischargescheduled for 06/05/2018.  Cardiac cath on 06/05/18 with 35%  Lesion of Proximal LAD, 70% lesion of Ost. To proximal RCA, and 60% lesion of proximal RCA to mid RCA, no intervention performed.  Cardiology is transferring pt to [facility] for further interrogation/check of pt's pacemaker.                SIGNIFICANT DIAGNOSTIC STUDIES Echocardiogram 05/28/18>> Left ventricle: Systolic function was severely reduced. The estimated ejection fraction was in the range of 20% to 25%.   Akinesis of the apical myocardium. Akinesis of the anteroseptal myocardium. Akinesis of the anterolateral myocardium. CT Head 05/28/18>> Negative CTA Chest 05/27/18>> 1. 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 Thoracic dissection extends into the abdominal aorta through the left common iliac artery as before. No evidence of aortic rupture or acute findings. 2. Cardiomegaly with pulmonary edema. Dense consolidation in the right upper lobe with debris in the right mainstem bronchus, highly suggestive of aspiration. Small bilateral pleural effusions with adjacent lower lobe atelectasis. 3. Buckle fractures of bilateral anterior ribs, commonly seen with CPR. 4. No acute findings in the abdomen or pelvis. Large colonic stool burden suggesting constipation. Cardiac Catheterization 05/28/18>> Ost LAD to Prox LAD lesion is 35% stenosed. Ost RCA to Prox RCA lesion is 70% stenosed. Prox RCA to Mid RCA lesion is 60% stenosed.  Stent in the mid LAD no significant restenosis, no significant disease in left circumflex and high-grade lesion in the proximal RCA.  Severe left ventricular systolic dysfunction.  Advise EP evaluation and possible evaluation for PCI and drug-eluting stent of the proximal RCA but since patient is from [place] will defer that over there.  MICRO DATA  Respiratory Culture 05/31/18>> Normal respiratory flora Blood x2 05/27/18>> No growth to  date Urine 05/27/18>> Negative MRSA PCR 05/28/18>> Negative  ANTIBIOTICS Cefepime 1/26>>1/26 Zosyn 1/27>>1/27 Vancomycin 1/26>>1/27  CONSULTS   TUBES / LINES    Discharge Exam: General: Acutely ill appearing female, laying in bed, on Nasal cannula, in NAD Neuro: Awake, A&O x4, no focal deficits CV:  RRR, s1s2, no M/R/G PULM: Clear to auscultation bilaterally, even, nonlabored, normal effort GI: No guarding or rebound tenderness, no distension, BS+ x4 Extremities: No edema, no deformities, 2+ pulses throughout  Vitals:   06/05/18 1600 06/05/18 1700 06/05/18 1800 06/05/18 2002  BP: (!) 151/120 (!) 142/80 (!) 142/69   Pulse: 88 91 90   Resp: (!) 29 (!) 21 (!) 22   Temp:    98.4 F (36.9 C)  TempSrc:      SpO2: 97%     Weight:      Height:         Discharge Labs  BMET Recent Labs  Lab 05/30/18 0003 05/30/18 0325 05/31/18 0534  06/02/18 0428 06/03/18 0501 06/04/18 0442 06/05/18 0426 06/05/18 0837 06/05/18 1836  NA  --  143 144   < > 146* 141 143  141 140  --   K  --  3.8 3.8   < > 4.1 3.9 3.4* 3.1* 3.0* 3.4*  CL  --  108 107   < > 104 99 99 97* 98  --   CO2  --  30 34*   < > 37* 38* 37* 36* 34*  --   GLUCOSE  --  182* 183*   < > 166* 179* 177* 169* 174*  --   BUN  --  30* 33*   < > 37* 30* 27* 25* 22  --   CREATININE  --  0.55 0.55   < > 0.50 0.52 0.46 0.59 0.66  --   CALCIUM  --  8.7* 8.6*   < > 9.4 9.2 9.2 8.7* 8.8*  --   MG 1.9 2.0 2.0  --   --  2.1  --  2.0  --   --   PHOS  --  3.4 4.1  --   --  4.4  --   --   --   --    < > = values in this interval not displayed.    CBC Recent Labs  Lab 06/04/18 0442 06/05/18 0426 06/05/18 0837  HGB 11.4* 12.8 13.2  HCT 37.2 40.6 41.6  WBC 9.4 8.6 8.2  PLT 263 277 286    Anti-Coagulation Recent Labs  Lab 06/04/18 0442 06/05/18 0426 06/05/18 0837  [name] 1.22 1.21 1.13          Allergies as of 06/05/2018   [name]     Medication List    STOP taking these medications   Fluticasone  Furoate 100 MCG/ACT Aepb Commonly known as:  ARNUITY ELLIPTA   furosemide 40 MG tablet Commonly known as:  LASIX Replaced by:  furosemide 10 MG/ML injection   insulin detemir 100 UNIT/ML injection Commonly known as:  LEVEMIR   metFORMIN 1000 MG tablet Commonly known as:  GLUCOPHAGE   montelukast 10 MG tablet Commonly known as:  SINGULAIR   warfarin 7.5 MG tablet Commonly known as:  COUMADIN     TAKE these medications   acetaminophen 325 MG tablet Commonly known as:  TYLENOL Take 2 tablets (650 mg total) by mouth every 4 (four) hours as needed for mild pain (temp > 101.5).   albuterol (2.5 MG/3ML) 0.083% nebulizer solution Commonly known as:  PROVENTIL Take 3 mLs (2.5 mg total) by nebulization every 2 (two) hours as needed for wheezing or shortness of breath.   amiodarone 360-4.14 MG/200ML-% Soln Commonly known as:  NEXTERONE PREMIX Inject 30 mg/hr into the vein continuous.   atorvastatin 40 MG tablet Commonly known as:  LIPITOR Take 1 tablet (40 mg total) by mouth daily at 6 PM. Start taking on:  June 06, 2018   bisacodyl 10 MG suppository Commonly known as:  DULCOLAX Place 1 suppository (10 mg total) rectally daily as needed for moderate constipation.   budesonide 0.5 MG/2ML nebulizer solution Commonly known as:  PULMICORT Take 2 mLs (0.5 mg total) by nebulization 2 (two) times daily.   carvedilol 6.25 MG tablet Commonly known as:  COREG Take 1 tablet (6.25 mg total) by mouth 2 (two) times daily with a meal. Start taking on:  June 06, 2018   clopidogrel 75 MG tablet Commonly known as:  PLAVIX Take 1 tablet (75 mg total) by mouth every morning. Start taking on:  June 06, 2018   docusate sodium 100 MG capsule Commonly known as:  COLACE Take 1 capsule (  100 mg total) by mouth daily. Start taking on:  June 06, 2018   ENSURE MAX PROTEIN Liqd Take 330 mLs (11 oz total) by mouth 2 (two) times daily.   famotidine 20 MG tablet Commonly known as:   PEPCID Take 1 tablet (20 mg total) by mouth 2 (two) times daily.   FLUoxetine 20 MG capsule Commonly known as:  PROZAC Take 2 capsules by mouth daily.   furosemide 10 MG/ML injection Commonly known as:  LASIX Inject 2 mLs (20 mg total) into the vein daily. Start taking on:  June 06, 2018 Replaces:  furosemide 40 MG tablet   heparin 5000 UNIT/ML injection Inject 1 mL (5,000 Units total) into the skin every 8 (eight) hours.   hydrALAZINE 20 MG/ML injection Commonly known as:  APRESOLINE Inject 0.5 mLs (10 mg total) into the vein every 4 (four) hours as needed (SBP>160).   insulin aspart 100 UNIT/ML injection Commonly known as:  novoLOG Inject 0-5 Units into the skin at bedtime.   insulin aspart 100 UNIT/ML injection Commonly known as:  novoLOG Inject 0-15 Units into the skin 3 (three) times daily with meals. Start taking on:  June 06, 2018   insulin glargine 100 UNIT/ML injection Commonly known as:  LANTUS Inject 0.05 mLs (5 Units total) into the skin daily. Start taking on:  June 06, 2018   ipratropium-albuterol 0.5-2.5 (3) MG/3ML Soln Commonly known as:  DUONEB Take 3 mLs by nebulization 3 (three) times daily.   menthol-cetylpyridinium 3 MG lozenge Commonly known as:  CEPACOL Take 1 lozenge (3 mg total) by mouth as needed for sore throat.   mouth rinse Liqd solution 15 mLs by Mouth Rinse route 2 (two) times daily.   multivitamin with minerals Tabs tablet Take 1 tablet by mouth daily. Start taking on:  June 06, 2018   ondansetron 4 MG/2ML Soln injection Commonly known as:  ZOFRAN Inject 2 mLs (4 mg total) into the vein every 6 (six) hours as needed for nausea.   ondansetron 4 MG/2ML Soln injection Commonly known as:  ZOFRAN Inject 2 mLs (4 mg total) into the vein every 6 (six) hours as needed for nausea or vomiting.   pantoprazole 40 MG tablet Commonly known as:  PROTONIX Take 1 tablet (40 mg total) by mouth at bedtime.   potassium chloride SA  20 MEQ tablet Commonly known as:  K-DUR,KLOR-CON Take 1 tablet (20 mEq total) by mouth every 2 (two) hours.   sacubitril-valsartan 24-26 MG Commonly known as:  ENTRESTO Take 1 tablet by mouth 2 (two) times daily.   senna-docusate 8.6-50 MG tablet Commonly known as:  Senokot-S Take 1 tablet by mouth 2 (two) times daily as needed for mild constipation.   sodium chloride 0.9 % infusion Inject 250 mLs into the vein as needed (for IV line care(Saline / Heparin Lock)).   sodium chloride flush 0.9 % Soln Commonly known as:  NS 10-40 mLs by Intracatheter route every 12 (twelve) hours.   sodium chloride flush 0.9 % Soln Commonly known as:  NS 10-40 mLs by Intracatheter route as needed (flush).   sodium chloride flush 0.9 % Soln Commonly known as:  NS Inject 3 mLs into the vein every 12 (twelve) hours.   sodium chloride flush 0.9 % Soln Commonly known as:  NS Inject 3 mLs into the vein as needed.   spironolactone 25 MG tablet Commonly known as:  ALDACTONE Take 1 tablet (25 mg total) by mouth daily. Start taking on:  June 06, 2018  Disposition: Stepdown.  Transfer to [facility].  Discharged Condition: Desiree Mccall has met maximum benefit of inpatient care and is medically stable and cleared for transfer to [facility].    Time spent on disposition:  60 Minutes.   Signed: Darel Hong, AGACNP-BC [facility] Pager: [phone] Cell: [phone]

## 2018-06-06 LAB — GLUCOSE, CAPILLARY
GLUCOSE-CAPILLARY: 155 mg/dL — AB (ref 70–99)
Glucose-Capillary: 163 mg/dL — ABNORMAL HIGH (ref 70–99)

## 2018-07-30 ENCOUNTER — Other Ambulatory Visit: Payer: Medicare HMO

## 2018-08-08 ENCOUNTER — Other Ambulatory Visit: Payer: Self-pay | Admitting: Pulmonary Disease

## 2019-02-20 ENCOUNTER — Other Ambulatory Visit: Payer: Medicare HMO

## 2019-02-27 ENCOUNTER — Inpatient Hospital Stay
Admission: EM | Admit: 2019-02-27 | Discharge: 2019-03-03 | DRG: 864 | Disposition: A | Discharge status: Home / Self Care | Payer: Medicare HMO | Attending: Internal Medicine | Admitting: Internal Medicine

## 2019-02-27 ENCOUNTER — Other Ambulatory Visit: Payer: Self-pay

## 2019-02-27 ENCOUNTER — Emergency Department: Payer: Medicare HMO

## 2019-02-27 ENCOUNTER — Encounter: Payer: Self-pay | Admitting: *Deleted

## 2019-02-27 DIAGNOSIS — Z8249 Family history of ischemic heart disease and other diseases of the circulatory system: Secondary | ICD-10-CM

## 2019-02-27 DIAGNOSIS — Z23 Encounter for immunization: Secondary | ICD-10-CM

## 2019-02-27 DIAGNOSIS — E1151 Type 2 diabetes mellitus with diabetic peripheral angiopathy without gangrene: Secondary | ICD-10-CM | POA: Diagnosis present

## 2019-02-27 DIAGNOSIS — Z7982 Long term (current) use of aspirin: Secondary | ICD-10-CM

## 2019-02-27 DIAGNOSIS — Z79899 Other long term (current) drug therapy: Secondary | ICD-10-CM | POA: Diagnosis not present

## 2019-02-27 DIAGNOSIS — R0682 Tachypnea, not elsewhere classified: Secondary | ICD-10-CM

## 2019-02-27 DIAGNOSIS — I2721 Secondary pulmonary arterial hypertension: Secondary | ICD-10-CM | POA: Diagnosis present

## 2019-02-27 DIAGNOSIS — I2510 Atherosclerotic heart disease of native coronary artery without angina pectoris: Secondary | ICD-10-CM | POA: Diagnosis present

## 2019-02-27 DIAGNOSIS — J80 Acute respiratory distress syndrome: Secondary | ICD-10-CM | POA: Diagnosis not present

## 2019-02-27 DIAGNOSIS — B9789 Other viral agents as the cause of diseases classified elsewhere: Secondary | ICD-10-CM | POA: Diagnosis present

## 2019-02-27 DIAGNOSIS — B348 Other viral infections of unspecified site: Secondary | ICD-10-CM | POA: Diagnosis not present

## 2019-02-27 DIAGNOSIS — Z7902 Long term (current) use of antithrombotics/antiplatelets: Secondary | ICD-10-CM | POA: Diagnosis not present

## 2019-02-27 DIAGNOSIS — D649 Anemia, unspecified: Secondary | ICD-10-CM | POA: Diagnosis present

## 2019-02-27 DIAGNOSIS — Z7951 Long term (current) use of inhaled steroids: Secondary | ICD-10-CM | POA: Diagnosis not present

## 2019-02-27 DIAGNOSIS — I7100 Dissection of unspecified site of aorta: Secondary | ICD-10-CM | POA: Diagnosis present

## 2019-02-27 DIAGNOSIS — R0602 Shortness of breath: Secondary | ICD-10-CM

## 2019-02-27 DIAGNOSIS — I252 Old myocardial infarction: Secondary | ICD-10-CM | POA: Diagnosis not present

## 2019-02-27 DIAGNOSIS — Z8674 Personal history of sudden cardiac arrest: Secondary | ICD-10-CM | POA: Diagnosis not present

## 2019-02-27 DIAGNOSIS — Z20828 Contact with and (suspected) exposure to other viral communicable diseases: Secondary | ICD-10-CM | POA: Diagnosis present

## 2019-02-27 DIAGNOSIS — E119 Type 2 diabetes mellitus without complications: Secondary | ICD-10-CM | POA: Diagnosis not present

## 2019-02-27 DIAGNOSIS — I5023 Acute on chronic systolic (congestive) heart failure: Secondary | ICD-10-CM | POA: Diagnosis not present

## 2019-02-27 DIAGNOSIS — Z7901 Long term (current) use of anticoagulants: Secondary | ICD-10-CM | POA: Diagnosis not present

## 2019-02-27 DIAGNOSIS — Z87891 Personal history of nicotine dependence: Secondary | ICD-10-CM

## 2019-02-27 DIAGNOSIS — I110 Hypertensive heart disease with heart failure: Secondary | ICD-10-CM | POA: Diagnosis present

## 2019-02-27 DIAGNOSIS — R509 Fever, unspecified: Secondary | ICD-10-CM | POA: Diagnosis present

## 2019-02-27 DIAGNOSIS — J9621 Acute and chronic respiratory failure with hypoxia: Secondary | ICD-10-CM | POA: Diagnosis not present

## 2019-02-27 DIAGNOSIS — Z8701 Personal history of pneumonia (recurrent): Secondary | ICD-10-CM

## 2019-02-27 DIAGNOSIS — Z794 Long term (current) use of insulin: Secondary | ICD-10-CM | POA: Diagnosis not present

## 2019-02-27 DIAGNOSIS — Z95810 Presence of automatic (implantable) cardiac defibrillator: Secondary | ICD-10-CM

## 2019-02-27 DIAGNOSIS — J9601 Acute respiratory failure with hypoxia: Secondary | ICD-10-CM | POA: Diagnosis not present

## 2019-02-27 DIAGNOSIS — R0603 Acute respiratory distress: Secondary | ICD-10-CM

## 2019-02-27 LAB — CBC
HCT: 37.1 % (ref 36.0–46.0)
Hemoglobin: 11.4 g/dL — ABNORMAL LOW (ref 12.0–15.0)
MCH: 25.1 pg — ABNORMAL LOW (ref 26.0–34.0)
MCHC: 30.7 g/dL (ref 30.0–36.0)
MCV: 81.5 fL (ref 80.0–100.0)
Platelets: 230 10*3/uL (ref 150–400)
RBC: 4.55 MIL/uL (ref 3.87–5.11)
RDW: 15.3 % (ref 11.5–15.5)
WBC: 10.1 10*3/uL (ref 4.0–10.5)
nRBC: 0 % (ref 0.0–0.2)

## 2019-02-27 LAB — BASIC METABOLIC PANEL
Anion gap: 9 (ref 5–15)
BUN: 16 mg/dL (ref 8–23)
CO2: 23 mmol/L (ref 22–32)
Calcium: 8.5 mg/dL — ABNORMAL LOW (ref 8.9–10.3)
Chloride: 105 mmol/L (ref 98–111)
Creatinine, Ser: 0.57 mg/dL (ref 0.44–1.00)
GFR calc Af Amer: >60 mL/min (ref >60)
GFR calc non Af Amer: >60 mL/min (ref >60)
Glucose, Bld: 96 mg/dL (ref 70–99)
Potassium: 4.1 mmol/L (ref 3.5–5.1)
Sodium: 137 mmol/L (ref 135–145)

## 2019-02-27 LAB — INFLUENZA PANEL BY PCR (TYPE A & B)
Influenza A By PCR: NEGATIVE
Influenza B By PCR: NEGATIVE

## 2019-02-27 LAB — MAGNESIUM: Magnesium: 2 mg/dL (ref 1.7–2.4)

## 2019-02-27 LAB — SARS CORONAVIRUS 2 BY RT PCR (HOSPITAL ORDER, PERFORMED IN ~~LOC~~ HOSPITAL LAB): SARS Coronavirus 2: NEGATIVE

## 2019-02-27 LAB — HEMOGLOBIN A1C
Hgb A1c MFr Bld: 6.8 % — ABNORMAL HIGH (ref 4.8–5.6)
Mean Plasma Glucose: 148.46 mg/dL

## 2019-02-27 LAB — LACTIC ACID, PLASMA: Lactic Acid, Venous: 1.2 mmol/L (ref 0.5–1.9)

## 2019-02-27 LAB — TROPONIN I (HIGH SENSITIVITY)
Troponin I (High Sensitivity): 21 ng/L — ABNORMAL HIGH (ref <18)
Troponin I (High Sensitivity): 25 ng/L — ABNORMAL HIGH (ref <18)

## 2019-02-27 LAB — GLUCOSE, CAPILLARY: Glucose-Capillary: 218 mg/dL — ABNORMAL HIGH (ref 70–99)

## 2019-02-27 MED ORDER — INSULIN ASPART 100 UNIT/ML ~~LOC~~ SOLN
0.0000 [IU] | Freq: Every day | SUBCUTANEOUS | Status: DC
  Administered 2019-02-27: 2 [IU] via SUBCUTANEOUS
  Administered 2019-02-28: 3 [IU] via SUBCUTANEOUS
  Filled 2019-02-27 (×2): qty 1

## 2019-02-27 MED ORDER — VANCOMYCIN HCL IN DEXTROSE 1-5 GM/200ML-% IV SOLN: 1000.0000 mg | Freq: Once | INTRAVENOUS | Status: DC

## 2019-02-27 MED ORDER — SPIRONOLACTONE 25 MG PO TABS
25.0000 mg | ORAL_TABLET | Freq: Every day | ORAL | Status: DC
  Administered 2019-02-28 – 2019-03-03 (×4): 25 mg via ORAL
  Filled 2019-02-27 (×4): qty 1

## 2019-02-27 MED ORDER — CARVEDILOL 3.125 MG PO TABS
6.2500 mg | ORAL_TABLET | Freq: Two times a day (BID) | ORAL | Status: DC
  Administered 2019-02-28: 10:00:00 6.25 mg via ORAL
  Filled 2019-02-27: qty 2

## 2019-02-27 MED ORDER — ACETAMINOPHEN 500 MG PO TABS
1000.0000 mg | ORAL_TABLET | Freq: Once | ORAL | Status: AC
  Administered 2019-02-27: 1000 mg via ORAL
  Filled 2019-02-27: qty 2

## 2019-02-27 MED ORDER — ADULT MULTIVITAMIN W/MINERALS CH
1.0000 | ORAL_TABLET | Freq: Every day | ORAL | Status: DC
  Administered 2019-02-28 – 2019-03-03 (×4): 1 via ORAL
  Filled 2019-02-27 (×4): qty 1

## 2019-02-27 MED ORDER — SODIUM CHLORIDE 0.9% FLUSH
3.0000 mL | Freq: Two times a day (BID) | INTRAVENOUS | Status: DC
  Administered 2019-02-27 – 2019-03-03 (×6): 3 mL via INTRAVENOUS

## 2019-02-27 MED ORDER — IOHEXOL 350 MG/ML SOLN
75.0000 mL | Freq: Once | INTRAVENOUS | Status: AC | PRN
  Administered 2019-02-27: 75 mL via INTRAVENOUS

## 2019-02-27 MED ORDER — VANCOMYCIN HCL 10 G IV SOLR
2000.0000 mg | INTRAVENOUS | Status: DC
  Filled 2019-02-27: qty 2000

## 2019-02-27 MED ORDER — AMIODARONE HCL IN DEXTROSE 360-4.14 MG/200ML-% IV SOLN
30.0000 mg/h | INTRAVENOUS | Status: DC
  Filled 2019-02-27 (×2): qty 200

## 2019-02-27 MED ORDER — MONTELUKAST SODIUM 10 MG PO TABS
10.0000 mg | ORAL_TABLET | Freq: Every day | ORAL | Status: DC
  Administered 2019-02-27 – 2019-03-02 (×3): 10 mg via ORAL
  Filled 2019-02-27 (×3): qty 1

## 2019-02-27 MED ORDER — BUDESONIDE 0.5 MG/2ML IN SUSP
0.5000 mg | Freq: Two times a day (BID) | RESPIRATORY_TRACT | Status: DC
  Administered 2019-02-28 (×2): 0.5 mg via RESPIRATORY_TRACT
  Filled 2019-02-27 (×2): qty 2

## 2019-02-27 MED ORDER — SODIUM CHLORIDE 0.9 % IV SOLN
2.0000 g | Freq: Three times a day (TID) | INTRAVENOUS | Status: DC
  Administered 2019-02-28 – 2019-03-01 (×4): 2 g via INTRAVENOUS
  Filled 2019-02-27 (×6): qty 2

## 2019-02-27 MED ORDER — FLUOXETINE HCL 20 MG PO CAPS
40.0000 mg | ORAL_CAPSULE | Freq: Every day | ORAL | Status: DC
  Administered 2019-02-28 – 2019-03-03 (×4): 40 mg via ORAL
  Filled 2019-02-27 (×4): qty 2

## 2019-02-27 MED ORDER — HYDROCODONE-ACETAMINOPHEN 5-325 MG PO TABS
1.0000 | ORAL_TABLET | ORAL | Status: DC | PRN
  Administered 2019-03-02 – 2019-03-03 (×2): 2 via ORAL
  Filled 2019-02-27 (×2): qty 2

## 2019-02-27 MED ORDER — ACETAMINOPHEN 650 MG RE SUPP: 650.0000 mg | Freq: Four times a day (QID) | RECTAL | Status: DC | PRN

## 2019-02-27 MED ORDER — SODIUM CHLORIDE 0.9 % IV SOLN
250.0000 mL | INTRAVENOUS | Status: DC | PRN
  Administered 2019-02-28: 250 mL via INTRAVENOUS

## 2019-02-27 MED ORDER — BISACODYL 10 MG RE SUPP: 10.0000 mg | Freq: Every day | RECTAL | Status: DC | PRN

## 2019-02-27 MED ORDER — DOCUSATE SODIUM 100 MG PO CAPS
100.0000 mg | ORAL_CAPSULE | Freq: Every day | ORAL | Status: DC
  Administered 2019-03-01 – 2019-03-03 (×3): 100 mg via ORAL
  Filled 2019-02-27 (×4): qty 1

## 2019-02-27 MED ORDER — ALBUTEROL SULFATE (2.5 MG/3ML) 0.083% IN NEBU
2.5000 mg | INHALATION_SOLUTION | RESPIRATORY_TRACT | Status: DC | PRN
  Administered 2019-02-28 (×3): 2.5 mg via RESPIRATORY_TRACT
  Filled 2019-02-27 (×3): qty 3

## 2019-02-27 MED ORDER — FLUTICASONE PROPIONATE 50 MCG/ACT NA SUSP
1.0000 | Freq: Every day | NASAL | Status: DC
  Administered 2019-03-02 – 2019-03-03 (×2): 1 via NASAL
  Filled 2019-02-27 (×2): qty 16

## 2019-02-27 MED ORDER — FAMOTIDINE 20 MG PO TABS
20.0000 mg | ORAL_TABLET | Freq: Two times a day (BID) | ORAL | Status: DC
  Administered 2019-02-27 – 2019-03-02 (×5): 20 mg via ORAL
  Filled 2019-02-27 (×5): qty 1

## 2019-02-27 MED ORDER — ACETAMINOPHEN 325 MG PO TABS
650.0000 mg | ORAL_TABLET | Freq: Four times a day (QID) | ORAL | Status: DC | PRN
  Administered 2019-02-27 – 2019-03-01 (×3): 650 mg via ORAL
  Filled 2019-02-27 (×3): qty 2

## 2019-02-27 MED ORDER — FUROSEMIDE 20 MG PO TABS
20.0000 mg | ORAL_TABLET | Freq: Every day | ORAL | Status: DC
  Administered 2019-02-28 – 2019-03-01 (×2): 20 mg via ORAL
  Filled 2019-02-27 (×2): qty 1

## 2019-02-27 MED ORDER — ENSURE MAX PROTEIN PO LIQD
11.0000 [oz_av] | Freq: Two times a day (BID) | ORAL | Status: DC
  Administered 2019-02-27 – 2019-03-01 (×3): 11 [oz_av] via ORAL
  Administered 2019-03-01: 237 mL via ORAL
  Administered 2019-03-02 – 2019-03-03 (×3): 11 [oz_av] via ORAL
  Filled 2019-02-27: qty 330

## 2019-02-27 MED ORDER — PNEUMOCOCCAL VAC POLYVALENT 25 MCG/0.5ML IJ INJ
0.5000 mL | INJECTION | INTRAMUSCULAR | Status: AC
  Administered 2019-02-28: 0.5 mL via INTRAMUSCULAR
  Filled 2019-02-27: qty 0.5

## 2019-02-27 MED ORDER — SODIUM CHLORIDE 0.9 % IV SOLN
2.0000 g | Freq: Once | INTRAVENOUS | Status: AC
  Administered 2019-02-27: 2 g via INTRAVENOUS
  Filled 2019-02-27 (×3): qty 2

## 2019-02-27 MED ORDER — METRONIDAZOLE IN NACL 5-0.79 MG/ML-% IV SOLN
500.0000 mg | Freq: Once | INTRAVENOUS | Status: AC
  Administered 2019-02-27: 500 mg via INTRAVENOUS
  Filled 2019-02-27: qty 100

## 2019-02-27 MED ORDER — SODIUM CHLORIDE 0.9% FLUSH
3.0000 mL | Freq: Once | INTRAVENOUS | Status: AC
  Administered 2019-02-27: 3 mL via INTRAVENOUS

## 2019-02-27 MED ORDER — ATORVASTATIN CALCIUM 20 MG PO TABS
40.0000 mg | ORAL_TABLET | Freq: Every day | ORAL | Status: DC
  Administered 2019-02-28 – 2019-03-02 (×3): 40 mg via ORAL
  Filled 2019-02-27 (×3): qty 2

## 2019-02-27 MED ORDER — RIVAROXABAN 20 MG PO TABS
20.0000 mg | ORAL_TABLET | Freq: Every day | ORAL | Status: DC
  Administered 2019-02-28 – 2019-03-02 (×3): 20 mg via ORAL
  Filled 2019-02-27 (×3): qty 1

## 2019-02-27 MED ORDER — ONDANSETRON HCL 4 MG/2ML IJ SOLN: 4.0000 mg | Freq: Four times a day (QID) | INTRAMUSCULAR | Status: DC | PRN

## 2019-02-27 MED ORDER — SENNOSIDES-DOCUSATE SODIUM 8.6-50 MG PO TABS: 1.0000 | ORAL_TABLET | Freq: Two times a day (BID) | ORAL | Status: DC | PRN

## 2019-02-27 MED ORDER — ASPIRIN EC 81 MG PO TBEC
81.0000 mg | DELAYED_RELEASE_TABLET | Freq: Every day | ORAL | Status: DC
  Administered 2019-02-28 – 2019-03-03 (×4): 81 mg via ORAL
  Filled 2019-02-27 (×4): qty 1

## 2019-02-27 MED ORDER — METOPROLOL SUCCINATE ER 100 MG PO TB24
100.0000 mg | ORAL_TABLET | Freq: Every day | ORAL | Status: DC
  Administered 2019-02-28 – 2019-03-03 (×2): 100 mg via ORAL
  Filled 2019-02-27 (×2): qty 2
  Filled 2019-02-27: qty 1

## 2019-02-27 MED ORDER — SODIUM CHLORIDE 0.9% FLUSH: 3.0000 mL | INTRAVENOUS | Status: DC | PRN

## 2019-02-27 MED ORDER — INSULIN DETEMIR 100 UNIT/ML ~~LOC~~ SOLN
30.0000 [IU] | Freq: Every day | SUBCUTANEOUS | Status: DC
  Administered 2019-02-28 – 2019-03-02 (×3): 30 [IU] via SUBCUTANEOUS
  Filled 2019-02-27 (×9): qty 0.3

## 2019-02-27 MED ORDER — PANTOPRAZOLE SODIUM 40 MG PO TBEC
40.0000 mg | DELAYED_RELEASE_TABLET | Freq: Every day | ORAL | Status: DC
  Administered 2019-02-27 – 2019-03-02 (×3): 40 mg via ORAL
  Filled 2019-02-27 (×3): qty 1

## 2019-02-27 MED ORDER — INSULIN ASPART 100 UNIT/ML ~~LOC~~ SOLN
0.0000 [IU] | Freq: Three times a day (TID) | SUBCUTANEOUS | Status: DC
  Administered 2019-02-28: 1 [IU] via SUBCUTANEOUS
  Administered 2019-02-28: 13:00:00 3 [IU] via SUBCUTANEOUS
  Administered 2019-03-01: 2 [IU] via SUBCUTANEOUS
  Administered 2019-03-02: 3 [IU] via SUBCUTANEOUS
  Administered 2019-03-03: 1 [IU] via SUBCUTANEOUS
  Administered 2019-03-03: 2 [IU] via SUBCUTANEOUS
  Filled 2019-02-27 (×6): qty 1

## 2019-02-27 MED ORDER — CLOPIDOGREL BISULFATE 75 MG PO TABS
75.0000 mg | ORAL_TABLET | Freq: Every morning | ORAL | Status: DC
  Administered 2019-02-28 – 2019-03-03 (×4): 75 mg via ORAL
  Filled 2019-02-27 (×4): qty 1

## 2019-02-27 MED ORDER — LOSARTAN POTASSIUM 50 MG PO TABS
100.0000 mg | ORAL_TABLET | Freq: Every day | ORAL | Status: DC
  Administered 2019-02-28 – 2019-03-03 (×4): 100 mg via ORAL
  Filled 2019-02-27 (×4): qty 2

## 2019-02-27 MED ORDER — ONDANSETRON HCL 4 MG PO TABS: 4.0000 mg | ORAL_TABLET | Freq: Four times a day (QID) | ORAL | Status: DC | PRN

## 2019-02-27 MED ORDER — VANCOMYCIN HCL 10 G IV SOLR
2500.0000 mg | Freq: Once | INTRAVENOUS | Status: AC
  Administered 2019-02-27: 2500 mg via INTRAVENOUS
  Filled 2019-02-27 (×2): qty 2500

## 2019-02-27 NOTE — ED Triage Notes (Signed)
Pt to triage via wheelchair   Pt has sob and fever.  Hx cardiac arrest.   No chest pain.  Sx for several days.  Pt has a cough.  Pt alert   Speech clear.

## 2019-02-27 NOTE — ED Notes (Signed)
Called x2 to give report, RN not available. This RN informed to give bedside report.

## 2019-02-27 NOTE — ED Triage Notes (Signed)
FIRST NURSE NOTE-pt here for [facility]. Severely labored and unable to catch breath.  Feels like did before her cardiac arrest in January.  Pulled for immediate EKG and room.

## 2019-02-27 NOTE — ED Provider Notes (Signed)
[facility] Emergency Department Provider Note  Time seen: 4:01 PM  I have reviewed the triage vital signs and the nursing notes.   HISTORY  Chief Complaint Shortness of Breath   HPI Desiree Mccall is a 68 y.o. female with a past medical history of CHF, diabetes, cardiac arrest, presents to the emergency department for shortness of breath fever weakness.  According to the patient since this morning she has been coughing with shortness of breath with generalized fatigue, weakness found to be febrile to 102.1 in the emergency department.  Patient states she has not been out of been around anyone sick.  Patient states in January of this past year she had a cardiac arrest following a pneumonia diagnosis which felt similar to today's presentation per patient.  States pain in the center of her chest but only when coughing.  Describes as a mild sharp pain.   Past Medical History:  Diagnosis Date  . Aortic dissection (HCC) 2010  . CHF (congestive heart failure) (HCC)   . Diabetes mellitus without complication (HCC)   . [name]   . Heart attack (HCC) sept. 25, 2010    Patient Active Problem List   Diagnosis Date Noted  . Chronic systolic CHF (congestive heart failure) (HCC) 05/28/2018  . Screening for malignant neoplasm of respiratory organ 07/19/2017  . H/O acute myocardial infarction 05/30/2017  . H/O: depression 05/30/2017  . Severe tobacco use disorder 05/30/2017  . Carotid stenosis, asymptomatic, left 05/04/2017  . OSA on CPAP 05/04/2017  . 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 Cat bite of forearm 11/22/2014  . Diabetes mellitus type 2, noninsulin dependent (HCC) 05/08/2014  . Syncope 04/10/2013  . Cardiac arrest (HCC) 04/10/2013  . Automatic implantable cardioverter-defibrillator in situ 07/13/2012  . Aortic dissection (HCC) 11/22/2011  . CAD (coronary artery disease) 11/22/2011  . Dyslipidemia 11/22/2011  . History of stroke 11/22/2011   . Ischemic cardiomyopathy 11/22/2011  . Lumbar disc disease 11/22/2011  . PAD (peripheral artery disease) (HCC) 11/22/2011  . Pancreatitis due to common bile duct stone 11/22/2011    Past Surgical History:  Procedure Laterality Date  . ABDOMINAL HYSTERECTOMY    . CARDIAC SURGERY    . CAROTID STENT  2010  . CHOLECYSTECTOMY    . LEFT HEART CATH AND CORONARY ANGIOGRAPHY N/A 06/05/2018   Procedure: LEFT HEART CATH AND CORONARY ANGIOGRAPHY;  Surgeon: Laurier Nancy, MD;  Location: [facility] INVASIVE CV LAB;  Service: Cardiovascular;  Laterality: N/A;  . STENT PLACE LEFT URETER ([facility] HX)      Prior to Admission medications   Medication Sig Start Date End Date Taking? Authorizing Provider  acetaminophen (TYLENOL) 325 MG tablet Take 2 tablets (650 mg total) by mouth every 4 (four) hours as needed for mild pain (temp > 101.5). 06/05/18   Harlon Ditty D, NP  albuterol (PROVENTIL) (2.5 MG/3ML) 0.083% nebulizer solution Take 3 mLs (2.5 mg total) by nebulization every 2 (two) hours as needed for wheezing or shortness of breath. 06/05/18   Judithe Modest, NP  amiodarone (NEXTERONE PREMIX) 360-4.14 MG/200ML-% SOLN Inject 30 mg/hr into the vein continuous. 06/05/18   Judithe Modest, NP  atorvastatin (LIPITOR) 40 MG tablet Take 1 tablet (40 mg total) by mouth daily at 6 PM. 06/06/18   Judithe Modest, NP  bisacodyl (DULCOLAX) 10 MG suppository Place 1 suppository (10 mg total) rectally daily as needed for moderate constipation. 06/05/18   Judithe Modest, NP  budesonide (PULMICORT) 0.5  MG/2ML nebulizer solution Take 2 mLs (0.5 mg total) by nebulization 2 (two) times daily. 06/05/18   Judithe Modest, NP  carvedilol (COREG) 6.25 MG tablet Take 1 tablet (6.25 mg total) by mouth 2 (two) times daily with a meal. 06/06/18   Judithe Modest, NP  clopidogrel (PLAVIX) 75 MG tablet Take 1 tablet (75 mg total) by mouth every morning. 06/06/18   Judithe Modest, NP  docusate sodium (COLACE) 100 MG capsule Take 1  capsule (100 mg total) by mouth daily. 06/06/18   Judithe Modest, NP  ENSURE MAX PROTEIN (ENSURE MAX PROTEIN) LIQD Take 330 mLs (11 oz total) by mouth 2 (two) times daily. 06/05/18   Judithe Modest, NP  famotidine (PEPCID) 20 MG tablet Take 1 tablet (20 mg total) by mouth 2 (two) times daily. 06/05/18   Judithe Modest, NP  FLUoxetine (PROZAC) 20 MG capsule Take 2 capsules by mouth daily.  03/12/15   [provider]  furosemide (LASIX) 10 MG/ML injection Inject 2 mLs (20 mg total) into the vein daily. 06/06/18   Harlon Ditty D, NP  heparin 5000 UNIT/ML injection Inject 1 mL (5,000 Units total) into the skin every 8 (eight) hours. 06/05/18   Judithe Modest, NP  hydrALAZINE (APRESOLINE) 20 MG/ML injection Inject 0.5 mLs (10 mg total) into the vein every 4 (four) hours as needed (SBP>160). 06/05/18   Judithe Modest, NP  insulin aspart (NOVOLOG) 100 UNIT/ML injection Inject 0-15 Units into the skin 3 (three) times daily with meals. 06/06/18   Harlon Ditty D, NP  insulin aspart (NOVOLOG) 100 UNIT/ML injection Inject 0-5 Units into the skin at bedtime. 06/05/18   Harlon Ditty D, NP  insulin glargine (LANTUS) 100 UNIT/ML injection Inject 0.05 mLs (5 Units total) into the skin daily. 06/06/18   Harlon Ditty D, NP  ipratropium-albuterol (DUONEB) 0.5-2.5 (3) MG/3ML SOLN Take 3 mLs by nebulization 3 (three) times daily. 06/05/18   Judithe Modest, NP  menthol-cetylpyridinium (CEPACOL) 3 MG lozenge Take 1 lozenge (3 mg total) by mouth as needed for sore throat. 06/05/18   Judithe Modest, NP  mouth rinse LIQD solution 15 mLs by Mouth Rinse route 2 (two) times daily. 06/05/18   Judithe Modest, NP  Multiple Vitamin (MULTIVITAMIN WITH MINERALS) TABS tablet Take 1 tablet by mouth daily. 06/06/18   Judithe Modest, NP  ondansetron (ZOFRAN) 4 MG/2ML SOLN injection Inject 2 mLs (4 mg total) into the vein every 6 (six) hours as needed for nausea. 06/05/18   Judithe Modest, NP  ondansetron (ZOFRAN) 4  MG/2ML SOLN injection Inject 2 mLs (4 mg total) into the vein every 6 (six) hours as needed for nausea or vomiting. 06/05/18   Judithe Modest, NP  pantoprazole (PROTONIX) 40 MG tablet Take 1 tablet (40 mg total) by mouth at bedtime. 06/05/18   Judithe Modest, NP  potassium chloride SA (K-DUR,KLOR-CON) 20 MEQ tablet Take 1 tablet (20 mEq total) by mouth every 2 (two) hours. 06/05/18   Judithe Modest, NP  sacubitril-valsartan (ENTRESTO) 24-26 MG Take 1 tablet by mouth 2 (two) times daily. 06/05/18   Judithe Modest, NP  senna-docusate (SENOKOT-S) 8.6-50 MG tablet Take 1 tablet by mouth 2 (two) times daily as needed for mild constipation. 06/05/18   Harlon Ditty D, NP  sodium chloride 0.9 % infusion Inject 250 mLs into the vein as needed (for IV line care(Saline / Heparin Lock)). 06/05/18   Judithe Modest, NP  sodium chloride flush (NS) 0.9 % SOLN 10-40 mLs by Intracatheter route every 12 (twelve) hours. 06/05/18   Darel Hong D, NP  sodium chloride flush (NS) 0.9 % SOLN 10-40 mLs by Intracatheter route as needed (flush). 06/05/18   Darel Hong D, NP  sodium chloride flush (NS) 0.9 % SOLN Inject 3 mLs into the vein every 12 (twelve) hours. 06/05/18   Darel Hong D, NP  sodium chloride flush (NS) 0.9 % SOLN Inject 3 mLs into the vein as needed. 06/05/18   Darel Hong D, NP  spironolactone (ALDACTONE) 25 MG tablet Take 1 tablet (25 mg total) by mouth daily. 06/06/18   Bradly Bienenstock, NP    [name]  Family History  Problem Relation Age of Onset  . Heart failure Mother   . Heart failure Father     Social History Social History   Tobacco Use  . Smoking status: Former Smoker    Packs/day: 0.50    Types: Cigarettes    Start date: 2012  . Smokeless tobacco: Never Used  Substance Use Topics  . Alcohol use: Yes    Comment: occasionally  . Drug use: No    Review of Systems Constitutional: Positive for fever.  Positive for weakness. Cardiovascular: Mild central chest  pain worse with cough. Respiratory: Positive for shortness of breath.  Positive for cough. Gastrointestinal: Negative for abdominal pain Musculoskeletal: Negative for musculoskeletal complaints Skin: Negative for skin complaints  Neurological: Negative for headache All other ROS negative  ____________________________________________   PHYSICAL EXAM:  VITAL SIGNS: ED Triage Vitals [02/27/19 1547]  Enc Vitals Group     BP 123/90     Pulse Rate 89     Resp (!) 24     Temp (!) 102.1 F (38.9 C)     Temp Source Oral     SpO2      Weight 224 lb (101.6 kg)     Height 5\' 7"  (1.702 m)     Head Circumference      Peak Flow      Pain Score      Pain Loc      Pain Edu?      Excl. in [place]?    Constitutional: Alert and oriented. Well appearing and in no distress. Eyes: Normal exam ENT      Head: Normocephalic and atraumatic      Mouth/Throat: Mucous membranes are moist. Cardiovascular: Normal rate, regular rhythm.  Respiratory: Tachypneic in the mid 30s, however clear breath sounds with no obvious wheeze rales or rhonchi occasional cough during exam. Gastrointestinal: Soft and nontender. No distention. Musculoskeletal: Nontender with normal range of motion in all extremities.  Neurologic:  Normal speech and language. No gross focal neurologic deficits  Skin:  Skin is warm, dry and intact.  Psychiatric: Mood and affect are normal.  ____________________________________________    EKG  EKG viewed and interpreted by myself shows a normal sinus rhythm 89 bpm with a narrow QRS, normal axis, largely normal intervals with nonspecific but no concerning ST changes.  ____________________________________________    RADIOLOGY  Chest x-ray essentially shows cardiomegaly with vascular congestion.  CTA of the chest is negative for PE, shows cardiomegaly, mild interstitial edema  ____________________________________________   INITIAL IMPRESSION / ASSESSMENT AND PLAN / ED  COURSE  Pertinent labs & imaging results that were available during my care of the patient were reviewed by me and considered in my medical decision making (see chart for details).   Patient presents to  the emergency department for shortness of breath cough fatigue found to be febrile to 102.1.  During my evaluation patient is quite tachypneic around 35 breaths/min, satting in the mid 90s currently.  Patient is febrile to 102.1.  We will check labs, chest x-ray, blood cultures, swab for coronavirus and continue to closely monitor.  Patient agreeable to plan of care.  Differential this time would include infectious etiologies such as pneumonia, viral infection such as Covid or influenza, metabolic or electrolyte abnormality, ACS.  Patient's labs are resulted largely within normal limits.  Normal white blood cell count.  Covid test is negative.  Send an influenza test which is negative.  CTA of the chest is largely negative as well.  However patient continues to be quite tachypneic around 35 to 40 breaths/min, along with her being febrile to 102 I have sent blood cultures we will start broad-spectrum antibiotics and admit to the hospitalist service for fever of unknown source with mild respiratory distress.  Leilani MerlMarie R Habib was evaluated in Emergency Department on 02/27/2019 for the symptoms described in the history of present illness. She was evaluated in the context of the global COVID-19 pandemic, which necessitated consideration that the patient might be at risk for infection with the SARS-CoV-2 virus that causes COVID-19. Institutional protocols and algorithms that pertain to the evaluation of patients at risk for COVID-19 are in a state of rapid change based on information released by regulatory bodies including the CDC and federal and state organizations. These policies and algorithms were followed during the patient's care in the ED.  ____________________________________________   FINAL CLINICAL  IMPRESSION(S) / ED DIAGNOSES  Fever Dyspnea Cough    Minna AntisPaduchowski, Ichael Pullara, MD 02/27/19 2007

## 2019-02-27 NOTE — Consult Note (Signed)
PHARMACY -  BRIEF ANTIBIOTIC NOTE   Pharmacy has received consult(s) for Cefepime/Vancomycin from an ED provider.  The patient's profile has been reviewed for ht/wt/allergies/indication/available labs.    One time order(s) placed for Cefepime 2g x1 and Vancomycin 2500 mg x1.   Further antibiotics/pharmacy consults should be ordered by admitting physician if indicated.                       Thank you, Rowland Lathe 02/27/2019  8:13 PM

## 2019-02-27 NOTE — ED Notes (Signed)
Patient complaining of continued SOB and increased work of breathing. Patient states mask is making breathing worse. Patient encouraged to remove mask when alone in room. Patient assisted to use toilet. Ambulatory to restroom. Patient remained 97% on 2L [place] while ambulating.

## 2019-02-27 NOTE — ED Notes (Signed)
1712 and 1724, per Pharmacy, Blood cultures 1 and 2 where sent to lab and received at these times.

## 2019-02-27 NOTE — H&P (Signed)
[employer] - [place] at [facility]   PATIENT NAME: Desiree Mccall    MR#:  [id number]  DATE OF BIRTH:  [birth year]  DATE OF ADMISSION:  02/27/2019  PRIMARY CARE PHYSICIAN: Abram Sander, MD   REQUESTING/REFERRING PHYSICIAN: Minna Antis, MD  CHIEF COMPLAINT:   Chief Complaint  Patient presents with  . Shortness of Breath   Fever and SOB today. HISTORY OF PRESENT ILLNESS:  Desiree Mccall  is a 68 y.o. female with a known history as below. Patient states that  she had cardiac arrest following pneumonia last January. She had fever 102 today with SOB, cough and weakness. She has tachypnea at 30's in ED.  She had chest pain while cough. CXR and CTA chest are unremarkable. She is treated with abx in ED. PAST MEDICAL HISTORY:   Past Medical History:  Diagnosis Date  . Aortic dissection (HCC) 2010  . CHF (congestive heart failure) (HCC)   . Diabetes mellitus without complication (HCC)   . [name]   . Heart attack (HCC) sept. 25, 2010    PAST SURGICAL HISTORY:   Past Surgical History:  Procedure Laterality Date  . ABDOMINAL HYSTERECTOMY    . CARDIAC SURGERY    . CAROTID STENT  2010  . CHOLECYSTECTOMY    . LEFT HEART CATH AND CORONARY ANGIOGRAPHY N/A 06/05/2018   Procedure: LEFT HEART CATH AND CORONARY ANGIOGRAPHY;  Surgeon: Laurier Nancy, MD;  Location: [facility] INVASIVE CV LAB;  Service: Cardiovascular;  Laterality: N/A;  . STENT PLACE LEFT URETER ([facility] HX)      SOCIAL HISTORY:   Social History   Tobacco Use  . Smoking status: Former Smoker    Packs/day: 0.50    Types: Cigarettes    Start date: 2012  . Smokeless tobacco: Never Used  Substance Use Topics  . Alcohol use: Yes    Comment: occasionally    FAMILY HISTORY:   Family History  Problem Relation Age of Onset  . Heart failure Mother   . Heart failure Father     DRUG ALLERGIES:  [name]  REVIEW OF SYSTEMS:   Review of Systems  Constitutional: Positive for  diaphoresis. Negative for chills, fever and malaise/fatigue.  HENT: Negative for sore throat.   Eyes: Negative for blurred vision and double vision.  Respiratory: Positive for cough and shortness of breath. Negative for hemoptysis, sputum production, wheezing and stridor.   Cardiovascular: Positive for chest pain. Negative for palpitations, orthopnea and leg swelling.  Gastrointestinal: Negative for abdominal pain, blood in stool, diarrhea, melena, nausea and vomiting.  Genitourinary: Negative for dysuria, flank pain and hematuria.  Musculoskeletal: Negative for back pain and joint pain.  Skin: Negative for rash.  Neurological: Negative for dizziness, sensory change, focal weakness, seizures, loss of consciousness, weakness and headaches.  Endo/Heme/Allergies: Negative for polydipsia.  Psychiatric/Behavioral: Negative for depression. The patient is not nervous/anxious.     MEDICATIONS AT HOME:   Prior to Admission medications   Medication Sig Start Date End Date Taking? Authorizing Provider  acetaminophen (TYLENOL) 325 MG tablet Take 2 tablets (650 mg total) by mouth every 4 (four) hours as needed for mild pain (temp > 101.5). 06/05/18   Harlon Ditty D, NP  albuterol (PROVENTIL) (2.5 MG/3ML) 0.083% nebulizer solution Take 3 mLs (2.5 mg total) by nebulization every 2 (two) hours as needed for wheezing or shortness of breath. 06/05/18   Judithe Modest, NP  amiodarone (NEXTERONE PREMIX) 360-4.14 MG/200ML-% SOLN Inject 30 mg/hr into  the vein continuous. 06/05/18   Judithe ModestKeene, Jeremiah D, NP  atorvastatin (LIPITOR) 40 MG tablet Take 1 tablet (40 mg total) by mouth daily at 6 PM. 06/06/18   Judithe ModestKeene, Jeremiah D, NP  bisacodyl (DULCOLAX) 10 MG suppository Place 1 suppository (10 mg total) rectally daily as needed for moderate constipation. 06/05/18   Judithe ModestKeene, Jeremiah D, NP  budesonide (PULMICORT) 0.5 MG/2ML nebulizer solution Take 2 mLs (0.5 mg total) by nebulization 2 (two) times daily. 06/05/18   Judithe ModestKeene, Jeremiah  D, NP  carvedilol (COREG) 6.25 MG tablet Take 1 tablet (6.25 mg total) by mouth 2 (two) times daily with a meal. 06/06/18   Judithe ModestKeene, Jeremiah D, NP  clopidogrel (PLAVIX) 75 MG tablet Take 1 tablet (75 mg total) by mouth every morning. 06/06/18   Judithe ModestKeene, Jeremiah D, NP  docusate sodium (COLACE) 100 MG capsule Take 1 capsule (100 mg total) by mouth daily. 06/06/18   Judithe ModestKeene, Jeremiah D, NP  ENSURE MAX PROTEIN (ENSURE MAX PROTEIN) LIQD Take 330 mLs (11 oz total) by mouth 2 (two) times daily. 06/05/18   Judithe ModestKeene, Jeremiah D, NP  famotidine (PEPCID) 20 MG tablet Take 1 tablet (20 mg total) by mouth 2 (two) times daily. 06/05/18   Judithe ModestKeene, Jeremiah D, NP  FLUoxetine (PROZAC) 20 MG capsule Take 2 capsules by mouth daily.  03/12/15   [provider]  furosemide (LASIX) 10 MG/ML injection Inject 2 mLs (20 mg total) into the vein daily. 06/06/18   Harlon DittyKeene, Jeremiah D, NP  heparin 5000 UNIT/ML injection Inject 1 mL (5,000 Units total) into the skin every 8 (eight) hours. 06/05/18   Judithe ModestKeene, Jeremiah D, NP  hydrALAZINE (APRESOLINE) 20 MG/ML injection Inject 0.5 mLs (10 mg total) into the vein every 4 (four) hours as needed (SBP>160). 06/05/18   Judithe ModestKeene, Jeremiah D, NP  insulin aspart (NOVOLOG) 100 UNIT/ML injection Inject 0-15 Units into the skin 3 (three) times daily with meals. 06/06/18   Harlon DittyKeene, Jeremiah D, NP  insulin aspart (NOVOLOG) 100 UNIT/ML injection Inject 0-5 Units into the skin at bedtime. 06/05/18   Harlon DittyKeene, Jeremiah D, NP  insulin glargine (LANTUS) 100 UNIT/ML injection Inject 0.05 mLs (5 Units total) into the skin daily. 06/06/18   Harlon DittyKeene, Jeremiah D, NP  ipratropium-albuterol (DUONEB) 0.5-2.5 (3) MG/3ML SOLN Take 3 mLs by nebulization 3 (three) times daily. 06/05/18   Judithe ModestKeene, Jeremiah D, NP  menthol-cetylpyridinium (CEPACOL) 3 MG lozenge Take 1 lozenge (3 mg total) by mouth as needed for sore throat. 06/05/18   Judithe ModestKeene, Jeremiah D, NP  mouth rinse LIQD solution 15 mLs by Mouth Rinse route 2 (two) times daily. 06/05/18   Judithe ModestKeene, Jeremiah  D, NP  Multiple Vitamin (MULTIVITAMIN WITH MINERALS) TABS tablet Take 1 tablet by mouth daily. 06/06/18   Judithe ModestKeene, Jeremiah D, NP  ondansetron (ZOFRAN) 4 MG/2ML SOLN injection Inject 2 mLs (4 mg total) into the vein every 6 (six) hours as needed for nausea. 06/05/18   Judithe ModestKeene, Jeremiah D, NP  ondansetron (ZOFRAN) 4 MG/2ML SOLN injection Inject 2 mLs (4 mg total) into the vein every 6 (six) hours as needed for nausea or vomiting. 06/05/18   Judithe ModestKeene, Jeremiah D, NP  pantoprazole (PROTONIX) 40 MG tablet Take 1 tablet (40 mg total) by mouth at bedtime. 06/05/18   Judithe ModestKeene, Jeremiah D, NP  potassium chloride SA (K-DUR,KLOR-CON) 20 MEQ tablet Take 1 tablet (20 mEq total) by mouth every 2 (two) hours. 06/05/18   Judithe ModestKeene, Jeremiah D, NP  sacubitril-valsartan (ENTRESTO) 24-26 MG Take 1 tablet by mouth 2 (  two) times daily. 06/05/18   Judithe Modest, NP  senna-docusate (SENOKOT-S) 8.6-50 MG tablet Take 1 tablet by mouth 2 (two) times daily as needed for mild constipation. 06/05/18   Harlon Ditty D, NP  sodium chloride 0.9 % infusion Inject 250 mLs into the vein as needed (for IV line care(Saline / Heparin Lock)). 06/05/18   Harlon Ditty D, NP  sodium chloride flush (NS) 0.9 % SOLN 10-40 mLs by Intracatheter route every 12 (twelve) hours. 06/05/18   Harlon Ditty D, NP  sodium chloride flush (NS) 0.9 % SOLN 10-40 mLs by Intracatheter route as needed (flush). 06/05/18   Harlon Ditty D, NP  sodium chloride flush (NS) 0.9 % SOLN Inject 3 mLs into the vein every 12 (twelve) hours. 06/05/18   Harlon Ditty D, NP  sodium chloride flush (NS) 0.9 % SOLN Inject 3 mLs into the vein as needed. 06/05/18   Harlon Ditty D, NP  spironolactone (ALDACTONE) 25 MG tablet Take 1 tablet (25 mg total) by mouth daily. 06/06/18   Harlon Ditty D, NP      VITAL SIGNS:  Blood pressure (!) 173/94, pulse 82, temperature 98.1 F (36.7 C), temperature source Oral, resp. rate (!) 38, height  (1.702 m), weight 101.6 kg, SpO2 93 %.  PHYSICAL  EXAMINATION:  Physical Exam  GENERAL:  68 y.o.-year-old patient lying in the bed with no acute distress.  EYES: Pupils equal, round, reactive to light and accommodation. No scleral icterus. Extraocular muscles intact.  HEENT: Head atraumatic, normocephalic. NECK:  Supple, no jugular venous distention. No thyroid enlargement, no tenderness.  LUNGS: Normal breath sounds bilaterally, no wheezing, rales,rhonchi or crepitation. No use of accessory muscles of respiration.  CARDIOVASCULAR: S1, S2 normal. No murmurs, rubs, or gallops.  ABDOMEN: Soft, nontender, nondistended. Bowel sounds present. No organomegaly or mass.  EXTREMITIES: No pedal edema, cyanosis, or clubbing.  NEUROLOGIC: Cranial nerves II through XII are intact. Muscle strength 5/5 in all extremities. Sensation intact. Gait not checked.  PSYCHIATRIC: The patient is alert and oriented x 3.  SKIN: No obvious rash, lesion, or ulcer.   LABORATORY PANEL:   CBC Recent Labs  Lab 02/27/19 1646  WBC 10.1  HGB 11.4*  HCT 37.1  PLT 230   ------------------------------------------------------------------------------------------------------------------  Chemistries  Recent Labs  Lab 02/27/19 1646  NA 137  K 4.1  CL 105  CO2 23  GLUCOSE 96  BUN 16  CREATININE 0.57  CALCIUM 8.5*   ------------------------------------------------------------------------------------------------------------------  Cardiac Enzymes No results for input(s): TROPONINI in the last 168 hours. ------------------------------------------------------------------------------------------------------------------  RADIOLOGY:  Ct Angio Chest Pe W And/or Wo Contrast  Result Date: 02/27/2019 CLINICAL DATA:  Dyspnea, shortness of breath and fever, history of cardiac arrest, no chest pain EXAM: CT ANGIOGRAPHY CHEST WITH CONTRAST TECHNIQUE: Multidetector CT imaging of the chest was performed using the standard protocol during bolus administration of intravenous  contrast. Multiplanar CT image reconstructions and MIPs were obtained to evaluate the vascular anatomy. CONTRAST:  75mL OMNIPAQUE IOHEXOL 350 MG/ML SOLN COMPARISON:  CTA 05/27/2018 FINDINGS: Cardiovascular: Satisfactory opacification of the pulmonary arteries to the segmental level. No pulmonary arterial filling defects are identified. There is central pulmonary artery enlargement. No elevation of the RV LV ratio to suggest right heart strain. There is cardiomegaly with predominantly left heart enlargement. There is a trace pericardial effusion. 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 The graft itself originates at the level of the left subclavian  artery without migration. Graft terminates above the level of the diaphragmatic hiatus. Dissection extends into the upper abdomen. There is extensive embolic material with resultant streak artifact at the level of the distal aortic arch. Overall caliber and contour of the aorta is unchanged from comparison exam. Atheromatous plaque is seen in the proximal great vessels. Major venous structures are unremarkable. Mediastinum/Nodes: No pathologically enlarged mediastinal, hilar or axillary adenopathy. No acute abnormality of the trachea. Thoracic esophagus is unremarkable. Thyroid gland and thoracic inlet are free of abnormality Lungs/Pleura: Bandlike areas of opacity in the lungs likely reflecting atelectasis and/or scarring. Mild interlobular septal thickening is noted. No focal consolidative process, pneumothorax or effusion. No suspicious nodules or masses. Stable 7 mm nodule in the right middle lobe (6/52) which is unchanged since exams as remote is 2011. Upper Abdomen: Interposition of the colon anterior to the liver. No acute abnormalities present in the visualized portions of the upper abdomen. Musculoskeletal: No acute osseous abnormality. No suspicious osseous  lesions. Several stable remote rib fracture deformities are again seen. Review of the MIP images confirms the above findings. IMPRESSION: 1. No evidence of pulmonary embolism. 2. Central pulmonary artery enlargement, suggestive of pulmonary arterial hypertension. 3. Cardiomegaly with predominantly left heart enlargement. Trace pericardial effusion. 4. Mild interlobular septal thickening, suggestive of mild interstitial pulmonary edema. 5. Stable appearance of known type B aortic dissection post endovascular stent graft repair. Dissection extends into the upper abdomen. Detailed evaluation is limited given lack of thoracic aortic opacification. 6. Aortic Atherosclerosis (ICD10-I70.0). Electronically Signed   By: Lovena Le M.D.   On: 02/27/2019 19:30   Dg Chest Portable 1 View  Result Date: 02/27/2019 CLINICAL DATA:  Fever, cough, shortness of breath EXAM: PORTABLE CHEST 1 VIEW COMPARISON:  06/02/2018 FINDINGS: Prior stent graft repair of the thoracic aorta. Left AICD in place, unchanged. Cardiomegaly with vascular congestion. No overt edema. Suspect small effusions. No acute bony abnormality. IMPRESSION: Cardiomegaly with vascular congestion.  Small bilateral effusions. Electronically Signed   By: Rolm Baptise M.D.   On: 02/27/2019 16:15      IMPRESSION AND PLAN:   Fever of unknown origion and tachypnea. Admit to medical floor. Continue abx, f/u blood culture.  CAD and PAD. Continue Xarelto, ASA, plavix and statin. CHF. Stable. Continue lasix. DM. Start SL. Continue levemir.  All the records are reviewed and case discussed with ED provider. Management plans discussed with the patient, family and they are in agreement.  CODE STATUS: Full code.  TOTAL TIME TAKING CARE OF THIS PATIENT:  58 minutes.    Demetrios Loll M.D on 02/27/2019 at 8:22 PM  Between 7am to 6pm - Pager - [phone]  After 6pm go to [email or web address] - [employer] [place] [facility]  Office   [phone]  CC: Primary care physician; Frazier Richards, MD   Note: This dictation was prepared with Dragon dictation along with smaller phrase technology. Any transcriptional errors that result from this process are unin

## 2019-02-27 NOTE — Progress Notes (Signed)
CODE SEPSIS - PHARMACY COMMUNICATION  **Broad Spectrum Antibiotics should be administered within 1 hour of Sepsis diagnosis**  Time Code Sepsis Called/Page Received: 2008  Antibiotics Ordered: Vancomycin, Cefepime, Flagyl  Time of 1st antibiotic administration: 2049  Additional action taken by pharmacy: none  If necessary, Name of Provider/Nurse Contacted: n/a    Desiree Mccall ,PharmD Clinical Pharmacist  02/27/2019  9:41 PM

## 2019-02-27 NOTE — Consult Note (Signed)
Pharmacy Antibiotic Note  Desiree Mccall is a 68 y.o. female admitted on 02/27/2019 with fever of unknown source.  Pharmacy has been consulted for Vancomycin and Cefepime dosing.  Plan: 1) Vancomycin 2000 mg IV Q 24 hrs. Goal AUC 400-550. Expected AUC: 453.9 Expected Css: 9.5 SCr used: 0.80(actual 0.57)   Height: 5\' 7"  [name]: 224 lb (101.6 kg) [name] (Calculated) : 61.6  Temp (24hrs), Avg:99.5 F (37.5 C), Min:98.1 F (36.7 C), Max:102.1 F (38.9 C)  Recent Labs  Lab 02/27/19 1646  WBC 10.1  CREATININE 0.57  LATICACIDVEN 1.2    Estimated Creatinine Clearance: 84.7 mL/min (by C-G formula based on SCr of 0.57 mg/dL).    [name]  Antimicrobials this admission: Vancomycin 10/28 >>  Cefepime 10/28 >>    Microbiology results: 10/28 BCx: pending 10/28 UCx: pending    Thank you for allowing pharmacy to be a part of this patient's care.  Lance Coon A Charmaine Placido 02/27/2019 10:19 PM

## 2019-02-27 NOTE — ED Notes (Signed)
Sent rainbow,second set of BC's,and gray on ice to lab.

## 2019-02-27 NOTE — ED Notes (Signed)
Called to give report and asked to call back in 10 minutes.

## 2019-02-27 NOTE — ED Notes (Signed)
Patient oxygen saturation 89-90% on room air. Patient placed on 2L [place] and saturation increased to 98%. MD made aware.

## 2019-02-28 ENCOUNTER — Inpatient Hospital Stay: Payer: Medicare HMO

## 2019-02-28 DIAGNOSIS — J80 Acute respiratory distress syndrome: Secondary | ICD-10-CM

## 2019-02-28 LAB — URINALYSIS, ROUTINE W REFLEX MICROSCOPIC
Bacteria, UA: NONE SEEN
Bilirubin Urine: NEGATIVE
Glucose, UA: NEGATIVE mg/dL
Hgb urine dipstick: NEGATIVE
Ketones, ur: NEGATIVE mg/dL
Leukocytes,Ua: NEGATIVE
Nitrite: NEGATIVE
Protein, ur: 100 mg/dL — AB
Specific Gravity, Urine: 1.009 (ref 1.005–1.030)
Squamous Epithelial / HPF: NONE SEEN (ref 0–5)
pH: 5 (ref 5.0–8.0)

## 2019-02-28 LAB — GLUCOSE, CAPILLARY
Glucose-Capillary: 126 mg/dL — ABNORMAL HIGH (ref 70–99)
Glucose-Capillary: 217 mg/dL — ABNORMAL HIGH (ref 70–99)
Glucose-Capillary: 93 mg/dL (ref 70–99)
Glucose-Capillary: 254 mg/dL — ABNORMAL HIGH (ref 70–99)

## 2019-02-28 LAB — BLOOD GAS, ARTERIAL
Acid-base deficit: 1.9 mmol/L (ref 0.0–2.0)
Bicarbonate: 22.2 mmol/L (ref 20.0–28.0)
Delivery systems: POSITIVE
Expiratory PAP: 5
FIO2: 60
Inspiratory PAP: 18
Mechanical Rate: 8
O2 Saturation: 99.8 %
Patient temperature: 37
pCO2 arterial: 35 mmHg (ref 32.0–48.0)
pH, Arterial: 7.41 (ref 7.350–7.450)
pO2, Arterial: 223 mmHg — ABNORMAL HIGH (ref 83.0–108.0)

## 2019-02-28 LAB — COMPREHENSIVE METABOLIC PANEL
ALT: 34 U/L (ref 0–44)
AST: 42 U/L — ABNORMAL HIGH (ref 15–41)
Albumin: 3.5 g/dL (ref 3.5–5.0)
Alkaline Phosphatase: 96 U/L (ref 38–126)
Anion gap: 15 (ref 5–15)
BUN: 23 mg/dL (ref 8–23)
CO2: 21 mmol/L — ABNORMAL LOW (ref 22–32)
Calcium: 8.8 mg/dL — ABNORMAL LOW (ref 8.9–10.3)
Chloride: 102 mmol/L (ref 98–111)
Creatinine, Ser: 0.78 mg/dL (ref 0.44–1.00)
GFR calc Af Amer: >60 mL/min (ref >60)
GFR calc non Af Amer: >60 mL/min (ref >60)
Glucose, Bld: 257 mg/dL — ABNORMAL HIGH (ref 70–99)
Potassium: 3.8 mmol/L (ref 3.5–5.1)
Sodium: 138 mmol/L (ref 135–145)
Total Bilirubin: 0.6 mg/dL (ref 0.3–1.2)
Total Protein: 6.8 g/dL (ref 6.5–8.1)

## 2019-02-28 LAB — CBC WITH DIFFERENTIAL/PLATELET
Abs Immature Granulocytes: 0.06 10*3/uL (ref 0.00–0.07)
Basophils Absolute: 0.1 10*3/uL (ref 0.0–0.1)
Basophils Relative: 1 %
Eosinophils Absolute: 0 10*3/uL (ref 0.0–0.5)
Eosinophils Relative: 0 %
HCT: 33.1 % — ABNORMAL LOW (ref 36.0–46.0)
Hemoglobin: 10.5 g/dL — ABNORMAL LOW (ref 12.0–15.0)
Immature Granulocytes: 1 %
Lymphocytes Relative: 6 %
Lymphs Abs: 0.7 10*3/uL (ref 0.7–4.0)
MCH: 25.7 pg — ABNORMAL LOW (ref 26.0–34.0)
MCHC: 31.7 g/dL (ref 30.0–36.0)
MCV: 80.9 fL (ref 80.0–100.0)
Monocytes Absolute: 0.5 10*3/uL (ref 0.1–1.0)
Monocytes Relative: 4 %
Neutro Abs: 10.8 10*3/uL — ABNORMAL HIGH (ref 1.7–7.7)
Neutrophils Relative %: 88 %
Platelets: 165 10*3/uL (ref 150–400)
RBC: 4.09 MIL/uL (ref 3.87–5.11)
RDW: 15.1 % (ref 11.5–15.5)
WBC: 12.2 10*3/uL — ABNORMAL HIGH (ref 4.0–10.5)
nRBC: 0 % (ref 0.0–0.2)

## 2019-02-28 LAB — BASIC METABOLIC PANEL
Anion gap: 8 (ref 5–15)
BUN: 19 mg/dL (ref 8–23)
CO2: 25 mmol/L (ref 22–32)
Calcium: 8.4 mg/dL — ABNORMAL LOW (ref 8.9–10.3)
Chloride: 107 mmol/L (ref 98–111)
Creatinine, Ser: 0.42 mg/dL — ABNORMAL LOW (ref 0.44–1.00)
GFR calc Af Amer: >60 mL/min (ref >60)
GFR calc non Af Amer: >60 mL/min (ref >60)
Glucose, Bld: 105 mg/dL — ABNORMAL HIGH (ref 70–99)
Potassium: 3.5 mmol/L (ref 3.5–5.1)
Sodium: 140 mmol/L (ref 135–145)

## 2019-02-28 LAB — CBC
HCT: 31.5 % — ABNORMAL LOW (ref 36.0–46.0)
Hemoglobin: 9.8 g/dL — ABNORMAL LOW (ref 12.0–15.0)
MCH: 25.3 pg — ABNORMAL LOW (ref 26.0–34.0)
MCHC: 31.1 g/dL (ref 30.0–36.0)
MCV: 81.2 fL (ref 80.0–100.0)
Platelets: 171 10*3/uL (ref 150–400)
RBC: 3.88 MIL/uL (ref 3.87–5.11)
RDW: 15.4 % (ref 11.5–15.5)
WBC: 7.4 10*3/uL (ref 4.0–10.5)
nRBC: 0 % (ref 0.0–0.2)

## 2019-02-28 LAB — BRAIN NATRIURETIC PEPTIDE: B Natriuretic Peptide: 622 pg/mL — ABNORMAL HIGH (ref 0.0–100.0)

## 2019-02-28 LAB — TROPONIN I (HIGH SENSITIVITY): Troponin I (High Sensitivity): 38 ng/L — ABNORMAL HIGH (ref <18)

## 2019-02-28 LAB — MRSA PCR SCREENING: MRSA by PCR: NEGATIVE

## 2019-02-28 LAB — PROCALCITONIN: Procalcitonin: <0.1 ng/mL

## 2019-02-28 MED ORDER — BENZONATATE 100 MG PO CAPS
200.0000 mg | ORAL_CAPSULE | Freq: Three times a day (TID) | ORAL | Status: DC | PRN
  Administered 2019-03-01 – 2019-03-02 (×3): 200 mg via ORAL
  Filled 2019-02-28 (×4): qty 2

## 2019-02-28 MED ORDER — LEVALBUTEROL HCL 1.25 MG/0.5ML IN NEBU
1.2500 mg | INHALATION_SOLUTION | Freq: Four times a day (QID) | RESPIRATORY_TRACT | Status: DC | PRN
  Filled 2019-02-28 (×2): qty 0.5

## 2019-02-28 MED ORDER — VANCOMYCIN HCL 1.5 G IV SOLR
1500.0000 mg | INTRAVENOUS | Status: DC
  Administered 2019-02-28: 1500 mg via INTRAVENOUS
  Filled 2019-02-28 (×2): qty 1500

## 2019-02-28 MED ORDER — LABETALOL HCL 5 MG/ML IV SOLN
INTRAVENOUS | Status: AC
  Administered 2019-02-28: 21:00:00 10 mg via INTRAVENOUS
  Filled 2019-02-28: qty 4

## 2019-02-28 MED ORDER — FUROSEMIDE 10 MG/ML IJ SOLN
INTRAMUSCULAR | Status: AC
  Administered 2019-02-28: 21:00:00 20 mg via INTRAVENOUS
  Filled 2019-02-28: qty 2

## 2019-02-28 MED ORDER — LABETALOL HCL 5 MG/ML IV SOLN
10.0000 mg | INTRAVENOUS | Status: DC | PRN
  Administered 2019-02-28: 21:00:00 10 mg via INTRAVENOUS

## 2019-02-28 MED ORDER — MORPHINE SULFATE (PF) 2 MG/ML IV SOLN
INTRAVENOUS | Status: AC
  Administered 2019-02-28: 1 mg via INTRAVENOUS
  Filled 2019-02-28: qty 1

## 2019-02-28 MED ORDER — BUDESONIDE 0.5 MG/2ML IN SUSP
0.5000 mg | Freq: Two times a day (BID) | RESPIRATORY_TRACT | Status: DC
  Administered 2019-02-28 – 2019-03-03 (×5): 0.5 mg via RESPIRATORY_TRACT
  Filled 2019-02-28 (×6): qty 2

## 2019-02-28 MED ORDER — FUROSEMIDE 10 MG/ML IJ SOLN
20.0000 mg | Freq: Once | INTRAMUSCULAR | Status: AC
  Administered 2019-02-28: 21:00:00 20 mg via INTRAVENOUS

## 2019-02-28 MED ORDER — CHLORHEXIDINE GLUCONATE 0.12 % MT SOLN
15.0000 mL | Freq: Two times a day (BID) | OROMUCOSAL | Status: DC
  Administered 2019-03-01 – 2019-03-03 (×5): 15 mL via OROMUCOSAL
  Filled 2019-02-28 (×5): qty 15

## 2019-02-28 MED ORDER — CHLORHEXIDINE GLUCONATE CLOTH 2 % EX PADS
6.0000 | MEDICATED_PAD | Freq: Every day | CUTANEOUS | Status: DC
  Administered 2019-02-28 – 2019-03-02 (×3): 6 via TOPICAL

## 2019-02-28 MED ORDER — MORPHINE SULFATE (PF) 2 MG/ML IV SOLN
1.0000 mg | Freq: Once | INTRAVENOUS | Status: AC
  Administered 2019-02-28: 21:00:00 1 mg via INTRAVENOUS

## 2019-02-28 MED ORDER — ORAL CARE MOUTH RINSE
15.0000 mL | Freq: Two times a day (BID) | OROMUCOSAL | Status: DC
  Administered 2019-03-03: 12:00:00 15 mL via OROMUCOSAL

## 2019-02-28 MED ORDER — HYDROCOD POLST-CPM POLST ER 10-8 MG/5ML PO SUER
5.0000 mL | Freq: Two times a day (BID) | ORAL | Status: DC | PRN
  Administered 2019-03-01 – 2019-03-03 (×4): 5 mL via ORAL
  Filled 2019-02-28 (×4): qty 5

## 2019-02-28 MED ORDER — IPRATROPIUM BROMIDE 0.02 % IN SOLN
0.5000 mg | RESPIRATORY_TRACT | Status: DC | PRN
  Administered 2019-03-02: 0.5 mg via RESPIRATORY_TRACT
  Filled 2019-02-28 (×2): qty 2.5

## 2019-02-28 NOTE — Progress Notes (Signed)
[place] at [place] NAME: Desiree Mccall    MR#:  [id number]  DATE OF BIRTH:  03-05-1951  SUBJECTIVE:  CHIEF COMPLAINT:   Chief Complaint  Patient presents with  . Shortness of Breath   No new complaint this morning.  Temperature on presentation was 102.1.  No fevers overnight.  No chest pain.  Shortness of breath improved  REVIEW OF SYSTEMS:  ROS Constitutional:  Negative for chills, fever and malaise/fatigue.  HENT: Negative for sore throat.   Eyes: Negative for blurred vision and double vision.  Respiratory: Shortness of breath, cough improved. Negative for hemoptysis, sputum production, wheezing and stridor.   Cardiovascular: Negative for palpitations, orthopnea and leg swelling.  Gastrointestinal: Negative for abdominal pain, blood in stool, diarrhea, melena, nausea and vomiting.  Genitourinary: Negative for dysuria, flank pain and hematuria.  Musculoskeletal: Negative for back pain and joint pain.  Skin: Negative for rash.  Neurological: Negative for dizziness, sensory change, focal weakness, seizures, loss of consciousness, weakness and headaches.  Endo/Heme/Allergies: Negative for polydipsia.  Psychiatric/Behavioral: Negative for depression. The patient is not nervous/anxious.    DRUG ALLERGIES:  [name] VITALS:  Blood pressure 127/61, pulse 72, temperature 98.7 F (37.1 C), temperature source Oral, resp. rate 16, height 5\' 7"  (1.702 m), weight 100 kg, SpO2 96 %. PHYSICAL EXAMINATION:  Physical Exam  GENERAL:  68 y.o.-year-old patient lying in the bed with no acute distress.  EYES: Pupils equal, round, reactive to light and accommodation. No scleral icterus. Extraocular muscles intact.  HEENT: Head atraumatic, normocephalic. NECK:  Supple, no jugular venous distention. No thyroid enlargement, no tenderness.  LUNGS: Normal breath sounds bilaterally, no wheezing, rales,rhonchi or crepitation. No use of accessory  muscles of respiration.  CARDIOVASCULAR: S1, S2 normal. No murmurs, rubs, or gallops.  ABDOMEN: Soft, nontender, nondistended. Bowel sounds present. No organomegaly or mass.  EXTREMITIES: No pedal edema, cyanosis, or clubbing.  NEUROLOGIC: Cranial nerves II through XII are intact. Muscle strength 5/5 in all extremities. Sensation intact. Gait not checked.  PSYCHIATRIC: The patient is alert and oriented x 3.  SKIN: No obvious rash, lesion, or ulcer.   LABORATORY PANEL:  Female CBC Recent Labs  Lab 02/28/19 0423  WBC 7.4  HGB 9.8*  HCT 31.5*  PLT 171   ------------------------------------------------------------------------------------------------------------------ Chemistries  Recent Labs  Lab 02/27/19 1945 02/28/19 0423  NA  --  140  K  --  3.5  CL  --  107  CO2  --  25  GLUCOSE  --  105*  BUN  --  19  CREATININE  --  0.42*  CALCIUM  --  8.4*  MG 2.0  --    RADIOLOGY:  Ct Angio Chest Pe W And/or Wo Contrast  Result Date: 02/27/2019 CLINICAL DATA:  Dyspnea, shortness of breath and fever, history of cardiac arrest, no chest pain EXAM: CT ANGIOGRAPHY CHEST WITH CONTRAST TECHNIQUE: Multidetector CT imaging of the chest was performed using the standard protocol during bolus administration of intravenous contrast. Multiplanar CT image reconstructions and MIPs were obtained to evaluate the vascular anatomy. CONTRAST:  82mL OMNIPAQUE IOHEXOL 350 MG/ML SOLN COMPARISON:  CTA 05/27/2018 FINDINGS: Cardiovascular: Satisfactory opacification of the pulmonary arteries to the segmental level. No pulmonary arterial filling defects are identified. There is central pulmonary artery enlargement. No elevation of the RV LV ratio to suggest right heart strain. There is cardiomegaly with predominantly left heart enlargement. There is a trace pericardial effusion. Thoracic aorta is largely  unopacified, precluding evaluation the patient's known type B aortic dissection post endovascular stent graft  repair. The graft itself originates at the level of the left subclavian artery without migration. Graft terminates above the level of the diaphragmatic hiatus. Dissection extends into the upper abdomen. There is extensive embolic material with resultant streak artifact at the level of the distal aortic arch. Overall caliber and contour of the aorta is unchanged from comparison exam. Atheromatous plaque is seen in the proximal great vessels. Major venous structures are unremarkable. Mediastinum/Nodes: No pathologically enlarged mediastinal, hilar or axillary adenopathy. No acute abnormality of the trachea. Thoracic esophagus is unremarkable. Thyroid gland and thoracic inlet are free of abnormality Lungs/Pleura: Bandlike areas of opacity in the lungs likely reflecting atelectasis and/or scarring. Mild interlobular septal thickening is noted. No focal consolidative process, pneumothorax or effusion. No suspicious nodules or masses. Stable 7 mm nodule in the right middle lobe (6/52) which is unchanged since exams as remote is 2011. Upper Abdomen: Interposition of the colon anterior to the liver. No acute abnormalities present in the visualized portions of the upper abdomen. Musculoskeletal: No acute osseous abnormality. No suspicious osseous lesions. Several stable remote rib fracture deformities are again seen. Review of the MIP images confirms the above findings. IMPRESSION: 1. No evidence of pulmonary embolism. 2. Central pulmonary artery enlargement, suggestive of pulmonary arterial hypertension. 3. Cardiomegaly with predominantly left heart enlargement. Trace pericardial effusion. 4. Mild interlobular septal thickening, suggestive of mild interstitial pulmonary edema. 5. Stable appearance of known type B aortic dissection post endovascular stent graft repair. Dissection extends into the upper abdomen. Detailed evaluation is limited given lack of thoracic aortic opacification. 6. Aortic Atherosclerosis  (ICD10-I70.0). Electronically Signed   By: Kreg ShropshirePrice  DeHay M.D.   On: 02/27/2019 19:30   Dg Chest Portable 1 View  Result Date: 02/27/2019 CLINICAL DATA:  Fever, cough, shortness of breath EXAM: PORTABLE CHEST 1 VIEW COMPARISON:  06/02/2018 FINDINGS: Prior stent graft repair of the thoracic aorta. Left AICD in place, unchanged. Cardiomegaly with vascular congestion. No overt edema. Suspect small effusions. No acute bony abnormality. IMPRESSION: Cardiomegaly with vascular congestion.  Small bilateral effusions. Electronically Signed   By: Charlett NoseKevin  Dover M.D.   On: 02/27/2019 16:15   ASSESSMENT AND PLAN:   1. Fever of 102.1 on admission and shortness of breath  Etiology not yet clear. CTA chest done negative for pulmonary embolism.  Reviewed findings consistent with pulmonary arterial hypertension with mild interstitial pulmonary edema. Patient has remained afebrile overnight.  Currently on empiric antibiotics with vancomycin and cefepime. Follow-up on blood cultures in a.m. and if no growth, will initiate de-escalation of antibiotics Requested for 2D echocardiogram to evaluate cardiac function  2.  Chronic systolic CHF with ejection fraction of 20 to 25% Stable clinically. Follow-up on repeat chest x-ray in a.m.  3.  Peripheral artery disease and coronary artery disease Stable.  Patient noted to be on on aspirin, Xarelto and statins  4.  Diabetes mellitus type 2 Sliding scale insulin coverage  DVT prophylaxis; patient on Xarelto  All the records are reviewed and case discussed with Care Management/Social Worker. Management plans discussed with the patient, and she is in agreement.  CODE STATUS: Full Code  TOTAL TIME TAKING CARE OF THIS PATIENT: 33 minutes.   More than 50% of the time was spent in counseling/coordination of care: YES  POSSIBLE D/C IN 2 DAYS, DEPENDING ON CLINICAL CONDITION.   Arvon Schreiner M.D on 02/28/2019 at 1:52 PM  Between 7am to 6pm -  Pager - [phone]  After  6pm go to [email or web address] - Scientist, research (life sciences) [facility]  Office  [phone]  CC: Primary care physician; Abram Sander, MD  Note: This dictation was prepared with Dragon dictation along with smaller phrase technology. Any transcriptional errors that result from this process are unintentional.

## 2019-02-28 NOTE — Progress Notes (Signed)
Called to room due to patient in respiratory distress. On NRB mask with sat of 100 HR 150's BBS diminished. Placed patient on v60 18/5 60% to support breathing. Rapid response called. SVN given in line. Transferred to unit. abg obtained.

## 2019-02-28 NOTE — Plan of Care (Signed)
  Problem: Activity: Goal: Risk for activity intolerance will decrease Outcome: Completed/Met

## 2019-02-28 NOTE — Consult Note (Addendum)
Pharmacy Antibiotic Note  Desiree Mccall is a 68 y.o. female admitted on 02/27/2019 with fever of unknown source.  Pharmacy has been consulted for Vancomycin and Cefepime dosing.  Plan: 1) Vancomycin 1500 mg IV Q 24 hrs. Goal AUC 400-550. Expected AUC: 498.1 Expected Css: 10.1 SCr used: 0.80(actual 0.42) BMI > 30  2) Continue Cefepime 2g 8H   Height: 5\' 7"  [name]: 220 lb 7.4 oz (100 kg) [name] (Calculated) : 61.6  Temp (24hrs), Avg:99.2 F (37.3 C), Min:98.1 F (36.7 C), Max:102.1 F (38.9 C)  Recent Labs  Lab 02/27/19 1646 02/28/19 0423  WBC 10.1 7.4  CREATININE 0.57 0.42*  LATICACIDVEN 1.2  --     Estimated Creatinine Clearance: 84.1 mL/min (A) (by C-G formula based on SCr of 0.42 mg/dL (L)).    [name]  Antimicrobials this admission: Vancomycin 10/28 >>  Cefepime 10/28 >>    Microbiology results: 10/28 BCx: NGTD 10/28 UCx: pending    Thank you for allowing pharmacy to be a part of this patient's care.  Rowland Lathe 02/28/2019 7:35 AM

## 2019-02-28 NOTE — Consult Note (Signed)
Name: ZOYA SPRECHER MRN: [id number] DOB: [birth year]    ADMISSION DATE:  02/27/2019 CONSULTATION DATE:  02/28/2019  REFERRING MD :  Dr. Anne Hahn  CHIEF COMPLAINT:  Acute Respiratory Distress  BRIEF PATIENT DESCRIPTION:  69 y.o. Female with PMH notable for HFrEF (EF 20-25%) admitted to Med-Surg unit on 10/28 for SOB, cough, and fever of unknown origin. Fever has since resolved, and CXR & CTA Chest negative for PE or Pneumonia.  On 10/29 she developed acute respiratory distress in the setting of pulmonary edema requiring transfer to ICU for BiPAP.  SIGNIFICANT EVENTS  10/28- Admission to Med/Surg unit 10/29- Acute respiratory distress requiring transfer to ICU for BiPAP  STUDIES:  10/28- CXR: Cardiomegaly with vascular congestion.  Small bilateral effusions. 10/28- CTA Chest: 1. No evidence of pulmonary embolism. 2. Central pulmonary artery enlargement, suggestive of pulmonary arterial hypertension. 3. Cardiomegaly with predominantly left heart enlargement. Trace pericardial effusion. 4. Mild interlobular septal thickening, suggestive of mild interstitial pulmonary edema. 5. Stable appearance of known type B aortic dissection post endovascular stent graft repair. Dissection extends into the upper abdomen. Detailed evaluation is limited given lack of thoracic aortic opacification. 6. Aortic Atherosclerosis 10/29- CXR: Relatively stable vascular congestion with some early interstitial edema.  CULTURES: SARS-CoV-2 PCR 10/28>> negative Influenza PCR 10/28>> negative Blood x210/28>> Respiratory viral panel 10/29>>  ANTIBIOTICS: Cefepime 10/28>> Vancomycin 10/28>>  HISTORY OF PRESENT ILLNESS:   Mrs. Fran Lowes is a 68 year old female with a past medical history notable for HFrEf (EF 20-25%), aortic dissection, MI, diabetes mellitus who presented to [facility] ED on 02/27/2019 with complaints of shortness of breath, cough, fever, weakness.  Initial lab work was unremarkable, and chest x-ray  and CTA chest are negative for pneumonia or pulmonary embolism.  She was placed on broad-spectrum antibiotics, and was admitted to MedSurg unit for further work-up and treatment of fever of unknown origin.  Since admission to the floor fever is since resolved.  Late in the evening on 10/29 she developed acute respiratory distress which she was subsequently placed on BiPAP.  She was noted to be severely hypertensive with blood pressure 172/102.  Chest x-ray concerning for early interstitial edema.  She received 20 mg of Lasix and was transferred to ICU.  Upon arrival to ICU her respiratory status is greatly improved.  BNP is 622, Procalcitonin is  negative and urinalysis is negative .Suspect her acute respiratory distress was in the setting of questionable flash pulmonary edema due to hypertension.  PCCM is consulted for further management.  PAST MEDICAL HISTORY :   has a past medical history of Aortic dissection (HCC) (2010), CHF (congestive heart failure) (HCC), Diabetes mellitus without complication (HCC), [name], and Heart attack (HCC) (sept. 25, 2010).  has a past surgical history that includes Cholecystectomy; Cardiac surgery; Abdominal hysterectomy; Carotid stent (2010); STENT PLACE LEFT URETER ([facility] HX); and LEFT HEART CATH AND CORONARY ANGIOGRAPHY (N/A, 06/05/2018). Prior to Admission medications   Medication Sig Start Date End Date Taking? Authorizing Provider  aspirin EC 81 MG tablet Take 81 mg by mouth daily. 10/29/18 10/29/19 Yes [provider]  atorvastatin (LIPITOR) 40 MG tablet Take 1 tablet (40 mg total) by mouth daily at 6 PM. 06/06/18  Yes Judithe Modest, NP  clopidogrel (PLAVIX) 75 MG tablet Take 1 tablet (75 mg total) by mouth every morning. 06/06/18  Yes Harlon Ditty D, NP  famotidine (PEPCID) 20 MG tablet Take 1 tablet (20 mg total) by mouth 2 (two) times daily. 06/05/18  Yes  Judithe Modest, NP  FLUoxetine (PROZAC) 20 MG capsule Take 2 capsules by mouth daily.   03/12/15  Yes [provider]  fluticasone (FLONASE) 50 MCG/ACT nasal spray Place 1 spray into the nose daily. 01/04/18  Yes [provider]  furosemide (LASIX) 20 MG tablet Take 20 mg by mouth daily. 12/06/18  Yes [provider]  Insulin Detemir (LEVEMIR FLEXTOUCH) 100 UNIT/ML Pen Inject 30 Units into the skin daily. 10/27/18  Yes [provider]  losartan (COZAAR) 50 MG tablet Take 100 mg by mouth daily. 01/02/19  Yes [provider]  metFORMIN (GLUCOPHAGE) 1000 MG tablet Take 1,000 mg by mouth 2 (two) times daily. 02/26/19  Yes [provider]  metoprolol succinate (TOPROL-XL) 100 MG 24 hr tablet Take 100 mg by mouth daily. 10/22/18  Yes [provider]  montelukast (SINGULAIR) 10 MG tablet Take 10 mg by mouth at bedtime. 12/11/18  Yes [provider]  Multiple Vitamin (MULTIVITAMIN WITH MINERALS) TABS tablet Take 1 tablet by mouth daily. 06/06/18  Yes Harlon Ditty D, NP  pantoprazole (PROTONIX) 40 MG tablet Take 1 tablet (40 mg total) by mouth at bedtime. 06/05/18  Yes Judithe Modest, NP  rivaroxaban (XARELTO) 20 MG TABS tablet Take 20 mg by mouth daily.   Yes [provider]  acetaminophen (TYLENOL) 325 MG tablet Take 2 tablets (650 mg total) by mouth every 4 (four) hours as needed for mild pain (temp > 101.5). 06/05/18   Harlon Ditty D, NP  albuterol (PROVENTIL) (2.5 MG/3ML) 0.083% nebulizer solution Take 3 mLs (2.5 mg total) by nebulization every 2 (two) hours as needed for wheezing or shortness of breath. 06/05/18   Judithe Modest, NP  carvedilol (COREG) 6.25 MG tablet Take 1 tablet (6.25 mg total) by mouth 2 (two) times daily with a meal. Patient not taking: Reported on 02/27/2019 06/06/18   Judithe Modest, NP  docusate sodium (COLACE) 100 MG capsule Take 1 capsule (100 mg total) by mouth daily. 06/06/18   Judithe Modest, NP  ENSURE MAX PROTEIN (ENSURE MAX PROTEIN) LIQD Take 330 mLs (11 oz total) by mouth 2 (two)  times daily. 06/05/18   Judithe Modest, NP  potassium chloride SA (K-DUR,KLOR-CON) 20 MEQ tablet Take 1 tablet (20 mEq total) by mouth every 2 (two) hours. Patient not taking: Reported on 02/27/2019 06/05/18   Judithe Modest, NP  senna-docusate (SENOKOT-S) 8.6-50 MG tablet Take 1 tablet by mouth 2 (two) times daily as needed for mild constipation. 06/05/18   Harlon Ditty D, NP  spironolactone (ALDACTONE) 25 MG tablet Take 1 tablet (25 mg total) by mouth daily. Patient not taking: Reported on 02/27/2019 06/06/18   Judithe Modest, NP   [name]  FAMILY HISTORY:  family history includes Heart failure in her father and mother. SOCIAL HISTORY:  reports that she has quit smoking. Her smoking use included cigarettes. She started smoking about 8 years ago. She smoked 0.50 packs per day. She has never used smokeless tobacco. She reports current alcohol use. She reports that she does not use drugs.   COVID-19 DISASTER DECLARATION:  FULL CONTACT PHYSICAL EXAMINATION WAS NOT POSSIBLE DUE TO TREATMENT OF COVID-19 AND  CONSERVATION OF PERSONAL PROTECTIVE EQUIPMENT, LIMITED EXAM FINDINGS INCLUDE-  Patient assessed or the symptoms described in the history of present illness.  In the context of the Global COVID-19 pandemic, which necessitated consideration that the patient might be at risk for infection with the SARS-CoV-2 virus that causes  COVID-19, Institutional protocols and algorithms that pertain to the evaluation of patients at risk for COVID-19 are in a state of rapid change based on information released by regulatory bodies including the CDC and federal and state organizations. These policies and algorithms were followed during the patient's care while in hospital.  REVIEW OF SYSTEMS: Positives in bold Constitutional: Negative for fever, chills, weight loss, malaise/fatigue and diaphoresis.  HENT: Negative for hearing loss, ear pain, nosebleeds, congestion, sore throat, neck pain,  tinnitus and ear discharge.   Eyes: Negative for blurred vision, double vision, photophobia, pain, discharge and redness.  Respiratory: Negative for +cough, hemoptysis, sputum production, +shortness of breath, wheezing and stridor.   Cardiovascular: Negative for chest pain, +palpitations, orthopnea, claudication, +leg swelling and PND.  Gastrointestinal: Negative for heartburn, nausea, vomiting, abdominal pain, diarrhea, constipation, blood in stool and melena.  Genitourinary: Negative for dysuria, urgency, frequency, hematuria and flank pain.  Musculoskeletal: Negative for myalgias, back pain, joint pain and falls.  Skin: Negative for itching and rash.  Neurological: Negative for dizziness, tingling, tremors, sensory change, speech change, focal weakness, seizures, loss of consciousness, weakness and headaches.  Endo/Heme/Allergies: Negative for environmental allergies and polydipsia. Does not bruise/bleed easily.  SUBJECTIVE:  Patient reports shortness of breath, which has improved greatly since being put on BiPAP Also endorses nonproductive cough, occasional wheezing, and bilateral lower extremity edema She denies abdominal pain, nausea vomiting, diarrhea  VITAL SIGNS: Temp:  [98.1 F (36.7 C)-101.3 F (38.5 C)] 101.3 F (38.5 C) (10/29 2024) Pulse Rate:  [67-88] 88 (10/29 2024) Resp:  [16-20] 20 (10/29 2024) BP: (102-172)/(61-102) 172/102 (10/29 2024) SpO2:  [96 %-100 %] 98 % (10/29 2024) Weight:  [100 kg] 100 kg (10/28 2237)  PHYSICAL EXAMINATION: General: Acutely ill-appearing female, sitting in bed, on BiPAP, and mild respiratory distress, able to speak in full sentences Neuro: Awake, alert and oriented x3, follows commands, speech clear, no focal deficits noted, pupils PERRLA HEENT: Atraumatic, normocephalic, neck supple, no JVD Cardiovascular: Regular rate and rhythm, S1-S2, no murmurs rubs or gallops, 2+ pulses throughout Lungs: Coarse breath sounds bilaterally, even, BiPAP  assisted, tachypnea, mildly increased effort Abdomen: Obese, soft, nontender, nondistended, no guarding or rebound tenderness Musculoskeletal: Normal bulk and tone, no deformities, 1+ bilateral lower extremity edema Skin: Warm and dry, no obvious rashes, lesions, or ulcerations  Recent Labs  Lab 02/27/19 1646 02/28/19 0423  NA 137 140  K 4.1 3.5  CL 105 107  CO2 23 25  BUN 16 19  CREATININE 0.57 0.42*  GLUCOSE 96 105*   Recent Labs  Lab 02/27/19 1646 02/28/19 0423  HGB 11.4* 9.8*  HCT 37.1 31.5*  WBC 10.1 7.4  PLT 230 171   Ct Angio Chest Pe W And/or Wo Contrast  Result Date: 02/27/2019 CLINICAL DATA:  Dyspnea, shortness of breath and fever, history of cardiac arrest, no chest pain EXAM: CT ANGIOGRAPHY CHEST WITH CONTRAST TECHNIQUE: Multidetector CT imaging of the chest was performed using the standard protocol during bolus administration of intravenous contrast. Multiplanar CT image reconstructions and MIPs were obtained to evaluate the vascular anatomy. CONTRAST:  75mL OMNIPAQUE IOHEXOL 350 MG/ML SOLN COMPARISON:  CTA 05/27/2018 FINDINGS: Cardiovascular: Satisfactory opacification of the pulmonary arteries to the segmental level. No pulmonary arterial filling defects are identified. There is central pulmonary artery enlargement. No elevation of the RV LV ratio to suggest right heart strain. There is cardiomegaly with predominantly left heart enlargement. There is a trace pericardial effusion. Thoracic aorta is largely unopacified, precluding evaluation the  patient's known type B aortic dissection post endovascular stent graft repair. The graft itself originates at the level of the left subclavian artery without migration. Graft terminates above the level of the diaphragmatic hiatus. Dissection extends into the upper abdomen. There is extensive embolic material with resultant streak artifact at the level of the distal aortic arch. Overall caliber and contour of the aorta is unchanged  from comparison exam. Atheromatous plaque is seen in the proximal great vessels. Major venous structures are unremarkable. Mediastinum/Nodes: No pathologically enlarged mediastinal, hilar or axillary adenopathy. No acute abnormality of the trachea. Thoracic esophagus is unremarkable. Thyroid gland and thoracic inlet are free of abnormality Lungs/Pleura: Bandlike areas of opacity in the lungs likely reflecting atelectasis and/or scarring. Mild interlobular septal thickening is noted. No focal consolidative process, pneumothorax or effusion. No suspicious nodules or masses. Stable 7 mm nodule in the right middle lobe (6/52) which is unchanged since exams as remote is 2011. Upper Abdomen: Interposition of the colon anterior to the liver. No acute abnormalities present in the visualized portions of the upper abdomen. Musculoskeletal: No acute osseous abnormality. No suspicious osseous lesions. Several stable remote rib fracture deformities are again seen. Review of the MIP images confirms the above findings. IMPRESSION: 1. No evidence of pulmonary embolism. 2. Central pulmonary artery enlargement, suggestive of pulmonary arterial hypertension. 3. Cardiomegaly with predominantly left heart enlargement. Trace pericardial effusion. 4. Mild interlobular septal thickening, suggestive of mild interstitial pulmonary edema. 5. Stable appearance of known type B aortic dissection post endovascular stent graft repair. Dissection extends into the upper abdomen. Detailed evaluation is limited given lack of thoracic aortic opacification. 6. Aortic Atherosclerosis (ICD10-I70.0). Electronically Signed   By: Lovena Le M.D.   On: 02/27/2019 19:30   Dg Chest Portable 1 View  Result Date: 02/27/2019 CLINICAL DATA:  Fever, cough, shortness of breath EXAM: PORTABLE CHEST 1 VIEW COMPARISON:  06/02/2018 FINDINGS: Prior stent graft repair of the thoracic aorta. Left AICD in place, unchanged. Cardiomegaly with vascular congestion. No  overt edema. Suspect small effusions. No acute bony abnormality. IMPRESSION: Cardiomegaly with vascular congestion.  Small bilateral effusions. Electronically Signed   By: Rolm Baptise M.D.   On: 02/27/2019 16:15    ASSESSMENT / PLAN:  Acute hypoxic respiratory failure in the setting of pulmonary edema -Supplemental O2 as needed to maintain O2 sats greater than 92% -BiPAP, wean as tolerated -Follow intermittent ABG and chest x-ray -CTA chest 10/28 negative for PE, suggestive of pulmonary arterial hypertension and cardiomegaly with mild interstitial pulmonary edema -Check BNP -IV Lasix as renal function and blood pressure permits  Acute on chronic HFrEF (EF 20-25% on 05/28/2018) Hypertension Hx: Aortic dissection, MI, PAD -Continuous cardiac monitoring -Maintain MAP greater than 65 -Check BNP -IV Lasix as blood pressure and renal function permits -Echocardiogram pending -Received IV labetalol with noted improvement in blood pressure -Continue losartan, metoprolol, spironolactone  Fever of unknown origin>>resolved -Monitor fever curve -Trend WBCs -Check procalcitonin>> procalcitonin is negative -Follow cultures as above -CTA Chest negative for focal infiltrate or pulmonary embolism -Check urinalysis>> negative for UTI -Check respiratory viral panel -Continue cefepime and vancomycin for now  Diabetes mellitus -CBGs -Sliding scale insulin -Follow ICU hypohyperglycemia protocol  Anemia without overt bleeding -Monitor for S/Sx of bleeding -Trend CBC -Continue Xarelto for VTE Prophylaxis  -Transfuse for Hgb <7     Disposition: ICU Goals of care: Full code VTE prophylaxis: Xarelto Updates: Updated patient at bedside 02/28/2019  Darel Hong, [facility] Pager: [phone]  02/28/2019, 9:20 PM

## 2019-03-01 ENCOUNTER — Inpatient Hospital Stay
Admit: 2019-03-01 | Discharge: 2019-03-01 | Disposition: A | Discharge status: Home / Self Care | Payer: Medicare HMO | Attending: Internal Medicine | Admitting: Internal Medicine

## 2019-03-01 LAB — BASIC METABOLIC PANEL
Anion gap: 9 (ref 5–15)
BUN: 16 mg/dL (ref 8–23)
CO2: 27 mmol/L (ref 22–32)
Calcium: 8.7 mg/dL — ABNORMAL LOW (ref 8.9–10.3)
Chloride: 105 mmol/L (ref 98–111)
Creatinine, Ser: 0.53 mg/dL (ref 0.44–1.00)
GFR calc Af Amer: >60 mL/min (ref >60)
GFR calc non Af Amer: >60 mL/min (ref >60)
Glucose, Bld: 134 mg/dL — ABNORMAL HIGH (ref 70–99)
Potassium: 3.8 mmol/L (ref 3.5–5.1)
Sodium: 141 mmol/L (ref 135–145)

## 2019-03-01 LAB — RESPIRATORY PANEL BY PCR

## 2019-03-01 LAB — GLUCOSE, CAPILLARY
Glucose-Capillary: 110 mg/dL — ABNORMAL HIGH (ref 70–99)
Glucose-Capillary: 155 mg/dL — ABNORMAL HIGH (ref 70–99)
Glucose-Capillary: 97 mg/dL (ref 70–99)
Glucose-Capillary: 115 mg/dL — ABNORMAL HIGH (ref 70–99)

## 2019-03-01 LAB — CBC
HCT: 33.7 % — ABNORMAL LOW (ref 36.0–46.0)
Hemoglobin: 10.5 g/dL — ABNORMAL LOW (ref 12.0–15.0)
MCH: 25.5 pg — ABNORMAL LOW (ref 26.0–34.0)
MCHC: 31.2 g/dL (ref 30.0–36.0)
MCV: 81.8 fL (ref 80.0–100.0)
Platelets: 175 10*3/uL (ref 150–400)
RBC: 4.12 MIL/uL (ref 3.87–5.11)
RDW: 15.3 % (ref 11.5–15.5)
WBC: 9.9 10*3/uL (ref 4.0–10.5)
nRBC: 0 % (ref 0.0–0.2)

## 2019-03-01 LAB — MAGNESIUM: Magnesium: 2.1 mg/dL (ref 1.7–2.4)

## 2019-03-01 LAB — URINE CULTURE: Culture: NO GROWTH

## 2019-03-01 LAB — BRAIN NATRIURETIC PEPTIDE: B Natriuretic Peptide: 928 pg/mL — ABNORMAL HIGH (ref 0.0–100.0)

## 2019-03-01 MED ORDER — FUROSEMIDE 40 MG PO TABS
40.0000 mg | ORAL_TABLET | Freq: Every day | ORAL | Status: DC
  Administered 2019-03-01 – 2019-03-03 (×3): 40 mg via ORAL
  Filled 2019-03-01 (×2): qty 2
  Filled 2019-03-01: qty 1

## 2019-03-01 MED ORDER — POTASSIUM CHLORIDE CRYS ER 20 MEQ PO TBCR
40.0000 meq | EXTENDED_RELEASE_TABLET | Freq: Once | ORAL | Status: AC
  Administered 2019-03-01: 40 meq via ORAL
  Filled 2019-03-01: qty 2

## 2019-03-01 MED ORDER — FUROSEMIDE 10 MG/ML IJ SOLN
20.0000 mg | Freq: Once | INTRAMUSCULAR | Status: AC
  Administered 2019-03-01: 20 mg via INTRAVENOUS
  Filled 2019-03-01: qty 2

## 2019-03-01 MED ORDER — IPRATROPIUM-ALBUTEROL 0.5-2.5 (3) MG/3ML IN SOLN
3.0000 mL | RESPIRATORY_TRACT | Status: DC
  Administered 2019-03-01 – 2019-03-03 (×12): 3 mL via RESPIRATORY_TRACT
  Filled 2019-03-01 (×10): qty 3

## 2019-03-01 NOTE — Progress Notes (Signed)
Shortly after the change of shift, this writer went to round on patient and to perform shift assessment. Patient stated that she could not breath, appeared very anxious and started taking her clothes off. Patient started to ask for help and stated that she could not breath. HR was up to 156. NRB mask was placed and MD called for orders. RRT was notified and BIPAP was also placed by RT. Labetalol, Lasix and Morphine were given as ordered  with some effect. Patient was taken to ICU and report given to ICU RN. Patient more stable at this point. Plan to call daughters to up date them later.

## 2019-03-01 NOTE — Progress Notes (Signed)
[place] [facility] [place] NAME: Desiree Mccall    MR#:  [id number]  DATE OF BIRTH:  1950-07-09  SUBJECTIVE:  CHIEF COMPLAINT:   Chief Complaint  Patient presents with  . Shortness of Breath   Patient apparently went into respiratory failure secondary to pulmonary edema last night requiring BiPAP and had to be transferred to ICU.  This morning patient already on nasal cannula and doing better.  No fevers.  No chest pain.  Shortness of breath improved  REVIEW OF SYSTEMS:  ROS Constitutional:  Negative for chills, fever and malaise/fatigue.  HENT: Negative for sore throat.   Eyes: Negative for blurred vision and double vision.  Respiratory: Shortness of breath, cough improved. Negative for hemoptysis, sputum production, wheezing and stridor.   Cardiovascular: Negative for palpitations, orthopnea and leg swelling.  Gastrointestinal: Negative for abdominal pain, blood in stool, diarrhea, melena, nausea and vomiting.  Genitourinary: Negative for dysuria, flank pain and hematuria.  Musculoskeletal: Negative for back pain and joint pain.  Skin: Negative for rash.  Neurological: Negative for dizziness, sensory change, focal weakness, seizures, loss of consciousness, weakness and headaches.  Endo/Heme/Allergies: Negative for polydipsia.  Psychiatric/Behavioral: Negative for depression. The patient is not nervous/anxious.    DRUG ALLERGIES:  [name] VITALS:  Blood pressure 101/62, pulse (!) 39, temperature 98.9 F (37.2 C), temperature source Oral, resp. rate 18, height 5\' 7"  (1.702 m), weight 100 kg, SpO2 99 %. PHYSICAL EXAMINATION:  Physical Exam  GENERAL:  68 y.o.-year-old patient lying in the bed with no acute distress.  EYES: Pupils equal, round, reactive to light and accommodation. No scleral icterus. Extraocular muscles intact.  HEENT: Head atraumatic, normocephalic. NECK:  Supple, no jugular venous distention. No thyroid  enlargement, no tenderness.  LUNGS: Normal breath sounds bilaterally, no wheezing, rales,rhonchi or crepitation. No use of accessory muscles of respiration.  CARDIOVASCULAR: S1, S2 normal. No murmurs, rubs, or gallops.  ABDOMEN: Soft, nontender, nondistended. Bowel sounds present. No organomegaly or mass.  EXTREMITIES: No pedal edema, cyanosis, or clubbing.  NEUROLOGIC: Cranial nerves II through XII are intact. Muscle strength 5/5 in all extremities. Sensation intact. Gait not checked.  PSYCHIATRIC: The patient is alert and oriented x 3.  SKIN: No obvious rash, lesion, or ulcer.   LABORATORY PANEL:  Female CBC Recent Labs  Lab 03/01/19 0412  WBC 9.9  HGB 10.5*  HCT 33.7*  PLT 175   ------------------------------------------------------------------------------------------------------------------ Chemistries  Recent Labs  Lab 02/28/19 2236 03/01/19 0412  NA 138 141  K 3.8 3.8  CL 102 105  CO2 21* 27  GLUCOSE 257* 134*  BUN 23 16  CREATININE 0.78 0.53  CALCIUM 8.8* 8.7*  MG  --  2.1  AST 42*  --   ALT 34  --   ALKPHOS 96  --   BILITOT 0.6  --    RADIOLOGY:  Dg Chest Port 1 View  Result Date: 02/28/2019 CLINICAL DATA:  Acute respiratory distress EXAM: PORTABLE CHEST 1 VIEW COMPARISON:  02/27/2019 FINDINGS: Cardiac shadow remains enlarged. Defibrillator is again noted. Aortic stent graft is again seen with embolic coils stable in appearance. The lungs are well aerated bilaterally. Mild vascular congestion is seen with early interstitial edema. No sizable effusion is noted. No bony abnormality is seen. IMPRESSION: Relatively stable vascular congestion with some early interstitial edema. Electronically Signed   By: Inez Catalina M.D.   On: 02/28/2019 21:18   ASSESSMENT AND PLAN:   1. Fever of 102.1  on admission and shortness of breath  Most likely due to viral syndrome.  Respiratory panel done positive for rhinovirus..  No fevers overnight  CTA chest done negative for  pulmonary embolism.  Reviewed findings consistent with pulmonary arterial hypertension with mild interstitial pulmonary edema. Patient has remained afebrile overnight.  Patient was initially placed on broad-spectrum IV antibiotics with vancomycin and cefepime which appears to have been discontinued in the ICU.  Monitor clinically  2.  Acute on chronic systolic CHF with ejection fraction of 20 to 25% Patient had to be transferred to ICU last night due to respiratory failure secondary to pulmonary edema requiring BiPAP last night.  This morning patient on oxygen via nasal cannula. Patient was given IV Lasix last night.  Back on p.o. Lasix this morning.  Continue metoprolol and losartan. 2D echocardiogram done this morning which not yet read.  3.  Peripheral artery disease and coronary artery disease Stable.  Patient noted to be on on aspirin, Xarelto and statins  4.  Diabetes mellitus type 2 Sliding scale insulin coverage  DVT prophylaxis; patient on Xarelto  All the records are reviewed and case discussed with Care Management/Social Worker. Management plans discussed with the patient, and she is in agreement. Anticipate transfer out of ICU later today  CODE STATUS: Full Code  TOTAL TIME TAKING CARE OF THIS PATIENT: 35 minutes.   More than 50% of the time was spent in counseling/coordination of care: YES  POSSIBLE D/C IN 2 DAYS, DEPENDING ON CLINICAL CONDITION.   Rithy Mandley M.D on 03/01/2019 at 1:29 PM  Between 7am to 6pm - Pager - [phone]  After 6pm go to [email or web address] - Scientist, research (life sciences) [facility]  Office  [phone]  CC: Primary care physician; Abram Sander, MD  Note: This dictation was prepared with Dragon dictation along with smaller phrase technology. Any transcriptional errors that result from this process are unintentional.

## 2019-03-01 NOTE — Consult Note (Signed)
Pharmacy Antibiotic Note  Desiree Mccall is a 68 y.o. female admitted on 02/27/2019 with fever of unknown sourcePharmacy has been consulted for Vancomycin and Cefepime dosing. As discussed on round patient with rhinovirus. Vancomycin and cefepime have been discontinue due to (-) MRSA PCR and (-) procalc.    Plan: Discontinue Vancomycin and cefepime.    Height: 5\' 7"  [name]: 220 lb 7.4 oz (100 kg) [name] (Calculated) : 61.6  Temp (24hrs), Avg:99.2 F (37.3 C), Min:98.1 F (36.7 C), Max:101.3 F (38.5 C)  Recent Labs  Lab 02/27/19 1646 02/28/19 0423 02/28/19 2236 03/01/19 0412  WBC 10.1 7.4 12.2* 9.9  CREATININE 0.57 0.42* 0.78 0.53  LATICACIDVEN 1.2  --   --   --     Estimated Creatinine Clearance: 84.1 mL/min (by C-G formula based on SCr of 0.53 mg/dL).    [name]  Antimicrobials this admission: Vancomycin 10/28 >> 10/30 Cefepime 10/28 >> 10/30   Microbiology results: 10/28 BCx: pending, no growth 2 days 10/28 SARS-CoV-2: negative 10/29 UCx: pending 10/29 MRSA PCR: Negative  10/30 Respiratory PCR: Rhinovirus/Enterovirus detected  Thank you for allowing pharmacy to be a part of this patient's care.  Sallye Lat, PharmD Candidate 03/01/2019 2:14 PM

## 2019-03-01 NOTE — Progress Notes (Signed)
*  PRELIMINARY RESULTS* Echocardiogram 2D Echocardiogram has been performed.  Sherrie Sport 03/01/2019, 9:30 AM

## 2019-03-01 NOTE — Progress Notes (Signed)
Assumed care of pt from Hiral P., RN. 

## 2019-03-01 NOTE — Progress Notes (Signed)
Pharmacy Electrolyte Monitoring Consult:  Pharmacy consulted to assist in monitoring and replacing electrolytes in this 68 y.o. female admitted on 02/27/2019. Patient positive for rhinovirus. Patient with past medical history significant for CHF and presenting with pulmonary edema.   Labs:  Sodium (mmol/L)  Date Value  03/01/2019 141   Potassium (mmol/L)  Date Value  03/01/2019 3.8   Magnesium (mg/dL)  Date Value  03/01/2019 2.1   Phosphorus (mg/dL)  Date Value  06/03/2018 4.4   Calcium (mg/dL)  Date Value  03/01/2019 8.7 (L)   Albumin (g/dL)  Date Value  02/28/2019 3.5    Assessment/Plan: Patient transitioned from furosemide 20mg  to furosemide 40mg  PO daily this am. Patient continued on spironolactone 25mg  daily.  Will order potassium 89mEq PO x 1.   Will replace orally in setting of diuresis/CHF.   Will replace for goal potassium ~ 4 and goal magnesium ~ 2.   Will obtain BMP/Magnesium with am labs.   Pharmacy will continue to monitor and adjust per consult.    Brax Walen L 03/01/2019 3:38 PM

## 2019-03-02 LAB — GLUCOSE, CAPILLARY
Glucose-Capillary: 122 mg/dL — ABNORMAL HIGH (ref 70–99)
Glucose-Capillary: 158 mg/dL — ABNORMAL HIGH (ref 70–99)
Glucose-Capillary: 202 mg/dL — ABNORMAL HIGH (ref 70–99)
Glucose-Capillary: 176 mg/dL — ABNORMAL HIGH (ref 70–99)

## 2019-03-02 LAB — CBC WITH DIFFERENTIAL/PLATELET
Abs Immature Granulocytes: 0.01 10*3/uL (ref 0.00–0.07)
Basophils Absolute: 0 10*3/uL (ref 0.0–0.1)
Basophils Relative: 0 %
Eosinophils Absolute: 0.1 10*3/uL (ref 0.0–0.5)
Eosinophils Relative: 3 %
HCT: 33 % — ABNORMAL LOW (ref 36.0–46.0)
Hemoglobin: 10.3 g/dL — ABNORMAL LOW (ref 12.0–15.0)
Immature Granulocytes: 0 %
Lymphocytes Relative: 24 %
Lymphs Abs: 1.2 10*3/uL (ref 0.7–4.0)
MCH: 25 pg — ABNORMAL LOW (ref 26.0–34.0)
MCHC: 31.2 g/dL (ref 30.0–36.0)
MCV: 80.1 fL (ref 80.0–100.0)
Monocytes Absolute: 0.6 10*3/uL (ref 0.1–1.0)
Monocytes Relative: 12 %
Neutro Abs: 3.2 10*3/uL (ref 1.7–7.7)
Neutrophils Relative %: 61 %
Platelets: 171 10*3/uL (ref 150–400)
RBC: 4.12 MIL/uL (ref 3.87–5.11)
RDW: 15.3 % (ref 11.5–15.5)
WBC: 5.2 10*3/uL (ref 4.0–10.5)
nRBC: 0 % (ref 0.0–0.2)

## 2019-03-02 LAB — ECHOCARDIOGRAM COMPLETE
Height: 67 in
Weight: 3527.36 oz

## 2019-03-02 LAB — BASIC METABOLIC PANEL
Anion gap: 9 (ref 5–15)
BUN: 19 mg/dL (ref 8–23)
CO2: 29 mmol/L (ref 22–32)
Calcium: 8.7 mg/dL — ABNORMAL LOW (ref 8.9–10.3)
Chloride: 103 mmol/L (ref 98–111)
Creatinine, Ser: 0.49 mg/dL (ref 0.44–1.00)
GFR calc Af Amer: >60 mL/min (ref >60)
GFR calc non Af Amer: >60 mL/min (ref >60)
Glucose, Bld: 120 mg/dL — ABNORMAL HIGH (ref 70–99)
Potassium: 3.8 mmol/L (ref 3.5–5.1)
Sodium: 141 mmol/L (ref 135–145)

## 2019-03-02 LAB — MAGNESIUM: Magnesium: 2.1 mg/dL (ref 1.7–2.4)

## 2019-03-02 MED ORDER — POTASSIUM CHLORIDE CRYS ER 20 MEQ PO TBCR
20.0000 meq | EXTENDED_RELEASE_TABLET | Freq: Once | ORAL | Status: AC
  Administered 2019-03-02: 11:00:00 20 meq via ORAL
  Filled 2019-03-02: qty 1

## 2019-03-02 NOTE — Progress Notes (Signed)
Pharmacy Electrolyte Monitoring Consult:  Pharmacy consulted to assist in monitoring and replacing electrolytes in this 68 y.o. female admitted on 02/27/2019. Patient positive for rhinovirus. Patient with past medical history significant for CHF and presenting with pulmonary edema.   Labs:  Sodium (mmol/L)  Date Value  03/02/2019 141   Potassium (mmol/L)  Date Value  03/02/2019 3.8   Magnesium (mg/dL)  Date Value  03/02/2019 2.1   Phosphorus (mg/dL)  Date Value  06/03/2018 4.4   Calcium (mg/dL)  Date Value  03/02/2019 8.7 (L)   Albumin (g/dL)  Date Value  02/28/2019 3.5    Assessment/Plan: K 3.8 Mag 2.1 Scr 0.49  Patient on furosemide 40mg  PO daily. Patient continued on spironolactone 25mg  daily.  Will order potassium 25mEq PO x 1.   Will replace orally in setting of diuresis/CHF.  Will replace for goal potassium ~ 4 and goal magnesium ~ 2.  Will obtain BMP/Magnesium with am labs.   Pharmacy will continue to monitor and adjust per consult.    Desiree Mccall A 03/02/2019 9:58 AM

## 2019-03-02 NOTE — Progress Notes (Addendum)
[facility] - [place] [facility]   PATIENT NAME: Desiree Mccall    MR#:  [id number]  DATE OF BIRTH:  11/22/1950  SUBJECTIVE:  CHIEF COMPLAINT:   Chief Complaint  Patient presents with  . Shortness of Breath  Patient seen and evaluated today Off BiPAP Still has cough and wheezing No complaints of chest pain No fever and chills  REVIEW OF SYSTEMS:  ROS Constitutional:  Negative for chills, fever and malaise/fatigue.  HENT: Negative for sore throat.   Eyes: Negative for blurred vision and double vision.  Respiratory: Shortness of breath, cough present. Negative for hemoptysis, sputum production,  mild wheezing  Cardiovascular: Negative for palpitations, orthopnea and leg swelling.  Gastrointestinal: Negative for abdominal pain, blood in stool, diarrhea, melena, nausea and vomiting.  Genitourinary: Negative for dysuria, flank pain and hematuria.  Musculoskeletal: Negative for back pain and joint pain.  Skin: Negative for rash.  Neurological: Negative for dizziness, sensory change, focal weakness, seizures, loss of consciousness, weakness and headaches.  Endo/Heme/Allergies: Negative for polydipsia.  Psychiatric/Behavioral: Negative for depression. The patient is not nervous/anxious.    DRUG ALLERGIES:  [name] VITALS:  Blood pressure 91/71, pulse 72, temperature 99.1 F (37.3 C), temperature source Axillary, resp. rate 18, height 5\' 7"  (1.702 m), weight 100 kg, SpO2 96 %. PHYSICAL EXAMINATION:  Physical Exam  GENERAL:  69 y.o.-year-old patient lying in the bed with no acute distress.  EYES: Pupils equal, round, reactive to light and accommodation. No scleral icterus. Extraocular muscles intact.  HEENT: Head atraumatic, normocephalic. NECK:  Supple, no jugular venous distention. No thyroid enlargement, no tenderness.  LUNGS: Improved breath sounds bilaterally, scattered wheezing, no rales,rhonchi or crepitation. No use of accessory muscles of  respiration.  CARDIOVASCULAR: S1, S2 normal. No murmurs, rubs, or gallops.  ABDOMEN: Soft, nontender, nondistended. Bowel sounds present. No organomegaly or mass.  EXTREMITIES: No pedal edema, cyanosis, or clubbing.  NEUROLOGIC: Cranial nerves II through XII are intact. Muscle strength 5/5 in all extremities. Sensation intact. Gait not checked.  PSYCHIATRIC: The patient is alert and oriented x 3.  SKIN: No obvious rash, lesion, or ulcer.   LABORATORY PANEL:  Female CBC Recent Labs  Lab 03/02/19 0622  WBC 5.2  HGB 10.3*  HCT 33.0*  PLT 171   ------------------------------------------------------------------------------------------------------------------ Chemistries  Recent Labs  Lab 02/28/19 2236  03/02/19 0622  NA 138   < > 141  K 3.8   < > 3.8  CL 102   < > 103  CO2 21*   < > 29  GLUCOSE 257*   < > 120*  BUN 23   < > 19  CREATININE 0.78   < > 0.49  CALCIUM 8.8*   < > 8.7*  MG  --    < > 2.1  AST 42*  --   --   ALT 34  --   --   ALKPHOS 96  --   --   BILITOT 0.6  --   --    < > = values in this interval not displayed.   RADIOLOGY:  No results found. ASSESSMENT AND PLAN:   1. Fever of 102.1 on admission and shortness of breath  Most likely due to viral syndrome.  Respiratory panel done positive for rhinovirus..  No fevers overnight  CTA chest done negative for pulmonary embolism.  Reviewed findings consistent with pulmonary arterial hypertension with mild interstitial pulmonary edema. Patient has remained afebrile overnight.  Patient was initially placed on broad-spectrum  IV antibiotics with vancomycin and cefepime which  have been discontinued in the ICU.  Monitor clinically  2.  Acute on chronic systolic CHF with ejection fraction of 20 to 25% Improving Patient has been weaned off BiPAP Patient was given IV Lasix for volume excess.  Back on p.o. Lasix for diuresis along with aldactone,  continue metoprolol and losartan. 2D echocardiogram done and results  3.   Peripheral artery disease and coronary artery disease Stable.  Patient noted to be on on aspirin, Xarelto and statins  4.  Diabetes mellitus type 2 Sliding scale insulin coverage  5.DVT prophylaxis; patient on Xarelto  6.  Status post respiratory failure Off BiPAP Transferred to medical floor once bed is available  All the records are reviewed and case discussed with Care Management/Social Worker. Management plans discussed with the patient, and she is in agreement. Anticipate transfer out of ICU later today  CODE STATUS: Full Code  TOTAL TIME TAKING CARE OF THIS PATIENT: 35 minutes.   More than 50% of the time was spent in counseling/coordination of care: YES  POSSIBLE D/C IN 2 DAYS, DEPENDING ON CLINICAL CONDITION.   Saundra Shelling M.D on 03/02/2019 at 10:04 AM  Between 7am to 6pm - Pager - [phone]  After 6pm go to [email or web address] - [employer] [place] [facility]  Office  [phone]  CC: Primary care physician; Frazier Richards, MD  Note: This dictation was prepared with Dragon dictation along with smaller phrase technology. Any transcriptional errors that result from this process are unintentional.

## 2019-03-02 NOTE — Progress Notes (Signed)
Patient is alert & oriented x 4.  Patient denies pain, is eating well, HR and rhythm within normal limits.  O2 is 95%+  Dry cough.  She is on 3L (acute)  because she has a long tube, she reports using 2L with her cpap at home and mostly room air during the day time. Patient requested oxygen.  She had been on room air for an hour or so.  MD said to discontinue foley irrigation orders.  Patient's urine is flowing well now and has a light pink tinge.  Phillis Knack, RN

## 2019-03-03 DIAGNOSIS — U071 COVID-19: Secondary | ICD-10-CM

## 2019-03-03 DIAGNOSIS — R509 Fever, unspecified: Principal | ICD-10-CM

## 2019-03-03 DIAGNOSIS — E119 Type 2 diabetes mellitus without complications: Secondary | ICD-10-CM

## 2019-03-03 DIAGNOSIS — J9621 Acute and chronic respiratory failure with hypoxia: Secondary | ICD-10-CM

## 2019-03-03 DIAGNOSIS — B348 Other viral infections of unspecified site: Secondary | ICD-10-CM

## 2019-03-03 HISTORY — DX: COVID-19: U07.1

## 2019-03-03 LAB — BASIC METABOLIC PANEL
Anion gap: 8 (ref 5–15)
BUN: 27 mg/dL — ABNORMAL HIGH (ref 8–23)
CO2: 29 mmol/L (ref 22–32)
Calcium: 8.7 mg/dL — ABNORMAL LOW (ref 8.9–10.3)
Chloride: 103 mmol/L (ref 98–111)
Creatinine, Ser: 0.48 mg/dL (ref 0.44–1.00)
GFR calc Af Amer: >60 mL/min (ref >60)
GFR calc non Af Amer: >60 mL/min (ref >60)
Glucose, Bld: 130 mg/dL — ABNORMAL HIGH (ref 70–99)
Potassium: 3.7 mmol/L (ref 3.5–5.1)
Sodium: 140 mmol/L (ref 135–145)

## 2019-03-03 LAB — GLUCOSE, CAPILLARY
Glucose-Capillary: 136 mg/dL — ABNORMAL HIGH (ref 70–99)
Glucose-Capillary: 175 mg/dL — ABNORMAL HIGH (ref 70–99)

## 2019-03-03 MED ORDER — IPRATROPIUM-ALBUTEROL 0.5-2.5 (3) MG/3ML IN SOLN
3.0000 mL | Freq: Four times a day (QID) | RESPIRATORY_TRACT | Status: DC
  Administered 2019-03-03: 3 mL via RESPIRATORY_TRACT
  Filled 2019-03-03: qty 3

## 2019-03-03 MED ORDER — BISACODYL 10 MG RE SUPP: 10.0000 mg | Freq: Every day | RECTAL | 0 refills | Status: DC | PRN

## 2019-03-03 MED ORDER — BENZONATATE 100 MG PO CAPS: 100.0000 mg | ORAL_CAPSULE | Freq: Four times a day (QID) | ORAL | 0 refills | Status: DC | PRN

## 2019-03-03 MED ORDER — POTASSIUM CHLORIDE CRYS ER 20 MEQ PO TBCR
20.0000 meq | EXTENDED_RELEASE_TABLET | Freq: Once | ORAL | Status: AC
  Administered 2019-03-03: 20 meq via ORAL
  Filled 2019-03-03: qty 1

## 2019-03-03 MED ORDER — BUDESONIDE 0.5 MG/2ML IN SUSP: 0.5000 mg | Freq: Two times a day (BID) | RESPIRATORY_TRACT | 12 refills | Status: DC

## 2019-03-03 NOTE — Discharge Summary (Signed)
Physician Discharge Summary  AUDIE WIESER RUE:[id number] DOB: [birth year] DOA: 02/27/2019  PCP: Abram Sander, MD  Admit date: 02/27/2019 Discharge date: 03/03/2019  Admitted From: Inpatient Disposition: home  Recommendations for Outpatient Follow-up:  1. Follow up with PCP in 1-2 weeks   Home Health:No Equipment/Devices:none  Discharge Condition:Stable CODE STATUS:Full code Diet recommendation: Diabetic diet  Brief/Interim Summary: Desiree Mccall  is a 68 y.o. female with a known history as below. Patient states that  she had cardiac arrest following pneumonia last January. She had fever 102 today with SOB, cough and weakness. She has tachypnea at 30's in ED.  She had chest pain while cough. CXR and CTA chest are unremarkable. She is treated with abx in ED.  Hospital course: Rhinovirus.  Patient is CTA done was negative for PE or acute findings.  She did have mild interstitial pulmonary edema which was treated with IV Lasix now on p.o. Lasix.  She was initially admitted to the ICU on BiPAP and has since been transferred out weaned off BiPAP.  Patient stable from respiratory status standpoint.  No indication for antibiotics given the positive rhinovirus normal white count afebrile. Acute on chronic systolic heart failure with an EF of 20 to 25%.  Patient received IV Lasix for excess volume she was placed back on p.o. Lasix at home dose which she is tolerating well continue Aldactone, metoprolol, and losartan.  As noted above patient is weaned off BiPAP is at her baseline. Peripheral arterial disease patient on aspirin Xarelto and statin which we will continue.  Diabetes mellitus continue sliding scale insulin while in the hospital, patient resume her home medication on discharge with close follow-up with her PCP for A1c and lipid monitoring. Acute respiratory failure resolved patient weaned off BiPAP transferred out of the ICU to the floor patient is remained stable on the floor she will be  discharged home on her home medications.  Requested nursing to evaluate O2 sats on room air stable anticipate discharge without oxygen if not we will add oxygen.  Review of records indicates she was 95% on RA yesterday, it was unclear for how long.   Discharge Diagnoses:  Active Problems:   Fever of unknown origin    Discharge Instructions  Discharge Instructions    (HEART FAILURE PATIENTS) Call MD:  Anytime you have any of the following symptoms: 1) 3 pound weight gain in 24 hours or 5 pounds in 1 week 2) shortness of breath, with or without a dry hacking cough 3) swelling in the hands, feet or stomach 4) if you have to sleep on extra pillows at night in order to breathe.   Complete by: As directed    Call MD for:  difficulty breathing, headache or visual disturbances   Complete by: As directed    Call MD for:  extreme fatigue   Complete by: As directed    Call MD for:  hives   Complete by: As directed    Call MD for:  persistant dizziness or light-headedness   Complete by: As directed    Call MD for:  persistant nausea and vomiting   Complete by: As directed    Call MD for:  redness, tenderness, or signs of infection (pain, swelling, redness, odor or green/yellow discharge around incision site)   Complete by: As directed    Call MD for:  severe uncontrolled pain   Complete by: As directed    Call MD for:  temperature >100.4   Complete by: As directed  Diet - low sodium heart healthy   Complete by: As directed    Increase activity slowly   Complete by: As directed      Allergies as of 03/03/2019   [name]     Medication List    TAKE these medications   acetaminophen 325 MG tablet Commonly known as: TYLENOL Take 2 tablets (650 mg total) by mouth every 4 (four) hours as needed for mild pain (temp > 101.5).   albuterol (2.5 MG/3ML) 0.083% nebulizer solution Commonly known as: PROVENTIL Take 3 mLs (2.5 mg total) by nebulization every 2 (two) hours as needed  for wheezing or shortness of breath.   aspirin EC 81 MG tablet Take 81 mg by mouth daily.   atorvastatin 40 MG tablet Commonly known as: LIPITOR Take 1 tablet (40 mg total) by mouth daily at 6 PM.   benzonatate 100 MG capsule Commonly known as: Tessalon Perles Take 1 capsule (100 mg total) by mouth every 6 (six) hours as needed for cough.   bisacodyl 10 MG suppository Commonly known as: DULCOLAX Place 1 suppository (10 mg total) rectally daily as needed for moderate constipation.   budesonide 0.5 MG/2ML nebulizer solution Commonly known as: PULMICORT Take 2 mLs (0.5 mg total) by nebulization 2 (two) times daily.   carvedilol 6.25 MG tablet Commonly known as: COREG Take 1 tablet (6.25 mg total) by mouth 2 (two) times daily with a meal.   clopidogrel 75 MG tablet Commonly known as: PLAVIX Take 1 tablet (75 mg total) by mouth every morning.   docusate sodium 100 MG capsule Commonly known as: COLACE Take 1 capsule (100 mg total) by mouth daily.   Ensure Max Protein Liqd Take 330 mLs (11 oz total) by mouth 2 (two) times daily.   famotidine 20 MG tablet Commonly known as: PEPCID Take 1 tablet (20 mg total) by mouth 2 (two) times daily.   FLUoxetine 20 MG capsule Commonly known as: PROZAC Take 2 capsules by mouth daily.   fluticasone 50 MCG/ACT nasal spray Commonly known as: FLONASE Place 1 spray into the nose daily.   furosemide 20 MG tablet Commonly known as: LASIX Take 20 mg by mouth daily.   Levemir FlexTouch 100 UNIT/ML Pen Generic drug: Insulin Detemir Inject 30 Units into the skin daily.   losartan 50 MG tablet Commonly known as: COZAAR Take 100 mg by mouth daily.   metFORMIN 1000 MG tablet Commonly known as: GLUCOPHAGE Take 1,000 mg by mouth 2 (two) times daily.   metoprolol succinate 100 MG 24 hr tablet Commonly known as: TOPROL-XL Take 100 mg by mouth daily.   montelukast 10 MG tablet Commonly known as: SINGULAIR Take 10 mg by mouth at  bedtime.   multivitamin with minerals Tabs tablet Take 1 tablet by mouth daily.   pantoprazole 40 MG tablet Commonly known as: PROTONIX Take 1 tablet (40 mg total) by mouth at bedtime.   potassium chloride SA 20 MEQ tablet Commonly known as: KLOR-CON Take 1 tablet (20 mEq total) by mouth every 2 (two) hours.   rivaroxaban 20 MG Tabs tablet Commonly known as: XARELTO Take 20 mg by mouth daily.   senna-docusate 8.6-50 MG tablet Commonly known as: Senokot-S Take 1 tablet by mouth 2 (two) times daily as needed for mild constipation.   spironolactone 25 MG tablet Commonly known as: ALDACTONE Take 1 tablet (25 mg total) by mouth daily.       [name]  Consultations:  PCCM   Procedures/Studies: Ct  Angio Chest Pe W And/or Wo Contrast  Result Date: 02/27/2019 CLINICAL DATA:  Dyspnea, shortness of breath and fever, history of cardiac arrest, no chest pain EXAM: CT ANGIOGRAPHY CHEST WITH CONTRAST TECHNIQUE: Multidetector CT imaging of the chest was performed using the standard protocol during bolus administration of intravenous contrast. Multiplanar CT image reconstructions and MIPs were obtained to evaluate the vascular anatomy. CONTRAST:  9mL OMNIPAQUE IOHEXOL 350 MG/ML SOLN COMPARISON:  CTA 05/27/2018 FINDINGS: Cardiovascular: Satisfactory opacification of the pulmonary arteries to the segmental level. No pulmonary arterial filling defects are identified. There is central pulmonary artery enlargement. No elevation of the RV LV ratio to suggest right heart strain. There is cardiomegaly with predominantly left heart enlargement. There is a trace pericardial effusion. 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 The graft itself originates at the level of the left subclavian artery without migration. Graft terminates above the level of the diaphragmatic hiatus. Dissection extends  into the upper abdomen. There is extensive embolic material with resultant streak artifact at the level of the distal aortic arch. Overall caliber and contour of the aorta is unchanged from comparison exam. Atheromatous plaque is seen in the proximal great vessels. Major venous structures are unremarkable. Mediastinum/Nodes: No pathologically enlarged mediastinal, hilar or axillary adenopathy. No acute abnormality of the trachea. Thoracic esophagus is unremarkable. Thyroid gland and thoracic inlet are free of abnormality Lungs/Pleura: Bandlike areas of opacity in the lungs likely reflecting atelectasis and/or scarring. Mild interlobular septal thickening is noted. No focal consolidative process, pneumothorax or effusion. No suspicious nodules or masses. Stable 7 mm nodule in the right middle lobe (6/52) which is unchanged since exams as remote is 2011. Upper Abdomen: Interposition of the colon anterior to the liver. No acute abnormalities present in the visualized portions of the upper abdomen. Musculoskeletal: No acute osseous abnormality. No suspicious osseous lesions. Several stable remote rib fracture deformities are again seen. Review of the MIP images confirms the above findings. IMPRESSION: 1. No evidence of pulmonary embolism. 2. Central pulmonary artery enlargement, suggestive of pulmonary arterial hypertension. 3. Cardiomegaly with predominantly left heart enlargement. Trace pericardial effusion. 4. Mild interlobular septal thickening, suggestive of mild interstitial pulmonary edema. 5. Stable appearance of known type B aortic dissection post endovascular stent graft repair. Dissection extends into the upper abdomen. Detailed evaluation is limited given lack of thoracic aortic opacification. 6. Aortic Atherosclerosis (ICD10-I70.0). Electronically Signed   By: Lovena Le M.D.   On: 02/27/2019 19:30   Dg Chest Port 1 View  Result Date: 02/28/2019 CLINICAL DATA:  Acute respiratory distress EXAM:  PORTABLE CHEST 1 VIEW COMPARISON:  02/27/2019 FINDINGS: Cardiac shadow remains enlarged. Defibrillator is again noted. Aortic stent graft is again seen with embolic coils stable in appearance. The lungs are well aerated bilaterally. Mild vascular congestion is seen with early interstitial edema. No sizable effusion is noted. No bony abnormality is seen. IMPRESSION: Relatively stable vascular congestion with some early interstitial edema. Electronically Signed   By: Inez Catalina M.D.   On: 02/28/2019 21:18   Dg Chest Portable 1 View  Result Date: 02/27/2019 CLINICAL DATA:  Fever, cough, shortness of breath EXAM: PORTABLE CHEST 1 VIEW COMPARISON:  06/02/2018 FINDINGS: Prior stent graft repair of the thoracic aorta. Left AICD in place, unchanged. Cardiomegaly with vascular congestion. No overt edema. Suspect small effusions. No acute bony abnormality. IMPRESSION: Cardiomegaly with vascular congestion.  Small bilateral effusions. Electronically Signed   By: Lennette Bihari  Dover M.D.   On: 02/27/2019 16:15       Subjective: Patient doing well at baseline Diagnosis rhinovirus respiratory status stable No acute events overnight  Discharge Exam: Vitals:   03/03/19 0540 03/03/19 0723  BP: (!) 141/66 106/68  Pulse: 68 75  Resp: 16 19  Temp: 98 F (36.7 C) 97.6 F (36.4 C)  SpO2: 100% 100%   Vitals:   03/03/19 0130 03/03/19 0312 03/03/19 0540 03/03/19 0723  BP:   (!) 141/66 106/68  Pulse: 76  68 75  Resp: 17  16 19   Temp:   98 F (36.7 C) 97.6 F (36.4 C)  TempSrc:   Oral Oral  SpO2: 99% 96% 100% 100%  Weight:      Height:        General: Pt is alert, awake, not in acute distress Cardiovascular: RRR, S1/S2 +, no rubs, no gallops Respiratory: CTA bilaterally, no wheezing, no rhonchi, good air movement breathing is unlabored normal rate Abdominal: Soft, NT, ND, bowel sounds + Extremities: no edema, no cyanosis    The results of significant diagnostics from this hospitalization (including  imaging, microbiology, ancillary and laboratory) are listed below for reference.     Microbiology: Recent Results (from the past 240 hour(s))  SARS Coronavirus 2 by RT PCR (hospital order, performed in [facility] hospital lab) Nasopharyngeal Nasopharyngeal Swab     Status: None   Collection Time: 02/27/19  3:59 PM   Specimen: Nasopharyngeal Swab  Result Value Ref Range Status   SARS Coronavirus 2 NEGATIVE NEGATIVE Final    Comment: (NOTE) If result is NEGATIVE SARS-CoV-2 target nucleic acids are NOT DETECTED. The SARS-CoV-2 RNA is generally detectable in upper and lower  respiratory specimens during the acute phase of infection. The lowest  concentration of SARS-CoV-2 viral copies this assay can detect is 250  copies / mL. A negative result does not preclude SARS-CoV-2 infection  and should not be used as the sole basis for treatment or other  patient management decisions.  A negative result may occur with  improper specimen collection / handling, submission of specimen other  than nasopharyngeal swab, presence of viral mutation(s) within the  areas targeted by this assay, and inadequate number of viral copies  (<250 copies / mL). A negative result must be combined with clinical  observations, patient history, and epidemiological information. If result is POSITIVE SARS-CoV-2 target nucleic acids are DETECTED. The SARS-CoV-2 RNA is generally detectable in upper and lower  respiratory specimens dur ing the acute phase of infection.  Positive  results are indicative of active infection with SARS-CoV-2.  Clinical  correlation with patient history and other diagnostic information is  necessary to determine patient infection status.  Positive results do  not rule out bacterial infection or co-infection with other viruses. If result is PRESUMPTIVE POSTIVE SARS-CoV-2 nucleic acids MAY BE PRESENT.   A presumptive positive result was obtained on the submitted specimen  and confirmed on  repeat testing.  While 2019 novel coronavirus  (SARS-CoV-2) nucleic acids may be present in the submitted sample  additional confirmatory testing may be necessary for epidemiological  and / or clinical management purposes  to differentiate between  SARS-CoV-2 and other Sarbecovirus currently known to infect humans.  If clinically indicated additional testing with an alternate test  methodology [phone]) is advised. The SARS-CoV-2 RNA is generally  detectable in upper and lower respiratory sp ecimens during the acute  phase of infection. The expected result is Negative. Fact Sheet for Patients:  BoilerBrush.com.cy Fact Sheet for Healthcare Providers: https://pope.com/ This test is not yet approved or cleared by the [place] FDA and has been authorized for detection and/or diagnosis of SARS-CoV-2 by FDA under an Emergency Use Authorization (EUA).  This EUA will remain in effect (meaning this test can be used) for the duration of the COVID-19 declaration under Section 564(b)(1) of the Act, 21 U.S.C. section 360bbb-3(b)(1), unless the authorization is terminated or revoked sooner. Performed at [facility], [address] [place], [place] [address]   Blood culture (routine x 2)     Status: None (Preliminary result)   Collection Time: 02/27/19  4:48 PM   Specimen: BLOOD  Result Value Ref Range Status   Specimen Description BLOOD BLOOD LEFT HAND  Final   Special Requests   Final    BOTTLES DRAWN AEROBIC AND ANAEROBIC Blood Culture adequate volume   Culture   Final    NO GROWTH 4 DAYS Performed at [facility], [address] [place], [place] [address]    Report Status PENDING  Incomplete  Blood culture (routine x 2)     Status: None (Preliminary result)   Collection Time: 02/27/19  5:10 PM   Specimen: BLOOD  Result Value Ref Range Status   Specimen Description BLOOD RIGHT ANTECUBITAL  Final   Special Requests    Final    BOTTLES DRAWN AEROBIC AND ANAEROBIC Blood Culture adequate volume   Culture   Final    NO GROWTH 4 DAYS Performed at [facility], [address], [place], [place] [address]    Report Status PENDING  Incomplete  MRSA PCR Screening     Status: None   Collection Time: 02/28/19  9:42 PM   Specimen: Nasopharyngeal  Result Value Ref Range Status   MRSA by PCR NEGATIVE NEGATIVE Final    Comment:        The GeneXpert MRSA Assay (FDA approved for NASAL specimens only), is one component of a comprehensive MRSA colonization surveillance program. It is not intended to diagnose MRSA infection nor to guide or monitor treatment for MRSA infections. Performed at [facility], [address], [place], [place] [address]   Urine Culture     Status: None   Collection Time: 02/28/19  9:57 PM   Specimen: Urine, Random  Result Value Ref Range Status   Specimen Description   Final    URINE, RANDOM Performed at [facility], [address] [place], [place] [address]    Special Requests   Final    NONE Performed at [facility], [address] [place], [place] [address]    Culture   Final    NO GROWTH Performed at [facility], [address] [place], [place] [address]    Report Status 03/01/2019 FINAL  Final  Respiratory Panel by PCR     Status: Abnormal   Collection Time: 03/01/19  1:51 AM   Specimen: Nasopharyngeal Swab; Respiratory  Result Value Ref Range Status   Adenovirus NOT DETECTED NOT DETECTED Final   Coronavirus 229E NOT DETECTED NOT DETECTED Final    Comment: (NOTE) The Coronavirus on the Respiratory Panel, DOES NOT test for the novel  Coronavirus (2019 nCoV)    Coronavirus HKU1 NOT DETECTED NOT DETECTED Final   Coronavirus NL63 NOT DETECTED NOT DETECTED Final   Coronavirus OC43 NOT DETECTED NOT DETECTED Final   Metapneumovirus NOT DETECTED NOT DETECTED Final   Rhinovirus / Enterovirus DETECTED (A) NOT DETECTED  Final  Influenza A NOT DETECTED NOT DETECTED Final   Influenza B NOT DETECTED NOT DETECTED Final   Parainfluenza Virus 1 NOT DETECTED NOT DETECTED Final   Parainfluenza Virus 2 NOT DETECTED NOT DETECTED Final   Parainfluenza Virus 3 NOT DETECTED NOT DETECTED Final   Parainfluenza Virus 4 NOT DETECTED NOT DETECTED Final   Respiratory Syncytial Virus NOT DETECTED NOT DETECTED Final   Bordetella pertussis NOT DETECTED NOT DETECTED Final   Chlamydophila pneumoniae NOT DETECTED NOT DETECTED Final   Mycoplasma pneumoniae NOT DETECTED NOT DETECTED Final    Comment: Performed at [facility], [address], [place], [place] [address]     Labs: BNP (last 3 results) Recent Labs    05/27/18 2120 02/28/19 2236 03/01/19 0412  BNP 1,217.0* 622.0* 928.0*   Basic Metabolic Panel: Recent Labs  Lab 02/27/19 1945 02/28/19 0423 02/28/19 2236 03/01/19 0412 03/02/19 0622 03/03/19 0519  NA  --  140 138 141 141 140  K  --  3.5 3.8 3.8 3.8 3.7  CL  --  107 102 105 103 103  CO2  --  25 21* GLUCOSE  --  105* 257* 134* 120* 130*  BUN  --  27*  CREATININE  --  0.42* 0.78 0.53 0.49 0.48  CALCIUM  --  8.4* 8.8* 8.7* 8.7* 8.7*  MG 2.0  --   --  2.1 2.1  --    Liver Function Tests: Recent Labs  Lab 02/28/19 2236  AST 42*  ALT 34  ALKPHOS 96  BILITOT 0.6  PROT 6.8  ALBUMIN 3.5   No results for input(s): LIPASE, AMYLASE in the last 168 hours. No results for input(s): AMMONIA in the last 168 hours. CBC: Recent Labs  Lab 02/27/19 1646 02/28/19 0423 02/28/19 2236 03/01/19 0412 03/02/19 0622  WBC 10.1 7.4 12.2* 9.9 5.2  NEUTROABS  --   --  10.8*  --  3.2  HGB 11.4* 9.8* 10.5* 10.5* 10.3*  HCT 37.1 31.5* 33.1* 33.7* 33.0*  MCV 81.5 81.2 80.9 81.8 80.1  PLT 230 171 165 175 171   Cardiac Enzymes: No results for input(s): CKTOTAL, CKMB, CKMBINDEX, TROPONINI in the last 168 hours. BNP: Invalid input(s): POCBNP CBG: Recent Labs  Lab 03/02/19 0804  03/02/19 1128 03/02/19 1822 03/02/19 2145 03/03/19 0719  GLUCAP 122* 158* 202* 176* 136*   D-Dimer No results for input(s): DDIMER in the last 72 hours. Hgb A1c No results for input(s): HGBA1C in the last 72 hours. Lipid Profile No results for input(s): CHOL, HDL, LDLCALC, TRIG, CHOLHDL, LDLDIRECT in the last 72 hours. Thyroid function studies No results for input(s): TSH, T4TOTAL, T3FREE, THYROIDAB in the last 72 hours.  Invalid input(s): FREET3 Anemia work up No results for input(s): VITAMINB12, FOLATE, FERRITIN, TIBC, IRON, RETICCTPCT in the last 72 hours. Urinalysis    Component Value Date/Time   COLORURINE YELLOW (A) 02/28/2019 2157   APPEARANCEUR HAZY (A) 02/28/2019 2157   LABSPEC 1.009 02/28/2019 2157   PHURINE 5.0 02/28/2019 2157   GLUCOSEU NEGATIVE 02/28/2019 2157   HGBUR NEGATIVE 02/28/2019 2157   BILIRUBINUR NEGATIVE 02/28/2019 2157   KETONESUR NEGATIVE 02/28/2019 2157   PROTEINUR 100 (A) 02/28/2019 2157   UROBILINOGEN 0.2 09/19/2009 1113   NITRITE NEGATIVE 02/28/2019 2157   LEUKOCYTESUR NEGATIVE 02/28/2019 2157   Sepsis Labs Invalid input(s): PROCALCITONIN,  WBC,  LACTICIDVEN Microbiology Recent Results (from the past 240 hour(s))  SARS Coronavirus 2 by RT PCR (hospital order, performed  in [facility] hospital lab) Nasopharyngeal Nasopharyngeal Swab     Status: None   Collection Time: 02/27/19  3:59 PM   Specimen: Nasopharyngeal Swab  Result Value Ref Range Status   SARS Coronavirus 2 NEGATIVE NEGATIVE Final    Comment: (NOTE) If result is NEGATIVE SARS-CoV-2 target nucleic acids are NOT DETECTED. The SARS-CoV-2 RNA is generally detectable in upper and lower  respiratory specimens during the acute phase of infection. The lowest  concentration of SARS-CoV-2 viral copies this assay can detect is 250  copies / mL. A negative result does not preclude SARS-CoV-2 infection  and should not be used as the sole basis for treatment or other  patient management  decisions.  A negative result may occur with  improper specimen collection / handling, submission of specimen other  than nasopharyngeal swab, presence of viral mutation(s) within the  areas targeted by this assay, and inadequate number of viral copies  (<250 copies / mL). A negative result must be combined with clinical  observations, patient history, and epidemiological information. If result is POSITIVE SARS-CoV-2 target nucleic acids are DETECTED. The SARS-CoV-2 RNA is generally detectable in upper and lower  respiratory specimens dur ing the acute phase of infection.  Positive  results are indicative of active infection with SARS-CoV-2.  Clinical  correlation with patient history and other diagnostic information is  necessary to determine patient infection status.  Positive results do  not rule out bacterial infection or co-infection with other viruses. If result is PRESUMPTIVE POSTIVE SARS-CoV-2 nucleic acids MAY BE PRESENT.   A presumptive positive result was obtained on the submitted specimen  and confirmed on repeat testing.  While 2019 novel coronavirus  (SARS-CoV-2) nucleic acids may be present in the submitted sample  additional confirmatory testing may be necessary for epidemiological  and / or clinical management purposes  to differentiate between  SARS-CoV-2 and other Sarbecovirus currently known to infect humans.  If clinically indicated additional testing with an alternate test  methodology [phone]([id number]) is advised. The SARS-CoV-2 RNA is generally  detectable in upper and lower respiratory sp ecimens during the acute  phase of infection. The expected result is Negative. Fact Sheet for Patients:  BoilerBrush.com.cyhttps://www.fda.gov/media/136312/download Fact Sheet for Healthcare Providers: https://pope.com/https://www.fda.gov/media/136313/download This test is not yet approved or cleared by the [place] FDA and has been authorized for detection and/or diagnosis of SARS-CoV-2 by FDA under an  Emergency Use Authorization (EUA).  This EUA will remain in effect (meaning this test can be used) for the duration of the COVID-19 declaration under Section 564(b)(1) of the Act, 21 U.S.C. section 360bbb-3(b)(1), unless the authorization is terminated or revoked sooner. Performed at [facility], [address] [place], [place] [address]   Blood culture (routine x 2)     Status: None (Preliminary result)   Collection Time: 02/27/19  4:48 PM   Specimen: BLOOD  Result Value Ref Range Status   Specimen Description BLOOD BLOOD LEFT HAND  Final   Special Requests   Final    BOTTLES DRAWN AEROBIC AND ANAEROBIC Blood Culture adequate volume   Culture   Final    NO GROWTH 4 DAYS Performed at [facility], [address] [place], [place] [address]    Report Status PENDING  Incomplete  Blood culture (routine x 2)     Status: None (Preliminary result)   Collection Time: 02/27/19  5:10 PM   Specimen: BLOOD  Result Value Ref Range Status   Specimen Description BLOOD RIGHT ANTECUBITAL  Final   Special Requests   Final    BOTTLES DRAWN AEROBIC AND ANAEROBIC Blood Culture adequate volume   Culture   Final    NO GROWTH 4 DAYS Performed at [facility], [address] [place], [place] [address]    Report Status PENDING  Incomplete  MRSA PCR Screening     Status: None   Collection Time: 02/28/19  9:42 PM   Specimen: Nasopharyngeal  Result Value Ref Range Status   MRSA by PCR NEGATIVE NEGATIVE Final    Comment:        The GeneXpert MRSA Assay (FDA approved for NASAL specimens only), is one component of a comprehensive MRSA colonization surveillance program. It is not intended to diagnose MRSA infection nor to guide or monitor treatment for MRSA infections. Performed at [facility], [address], [place], [place] [address]   Urine Culture     Status: None   Collection Time: 02/28/19  9:57 PM   Specimen: Urine, Random  Result Value Ref  Range Status   Specimen Description   Final    URINE, RANDOM Performed at [facility], [address] [place], [place] [address]    Special Requests   Final    NONE Performed at [facility], [address] [place], [place] [address]    Culture   Final    NO GROWTH Performed at [facility], [address] [place], [place] [address]    Report Status 03/01/2019 FINAL  Final  Respiratory Panel by PCR     Status: Abnormal   Collection Time: 03/01/19  1:51 AM   Specimen: Nasopharyngeal Swab; Respiratory  Result Value Ref Range Status   Adenovirus NOT DETECTED NOT DETECTED Final   Coronavirus 229E NOT DETECTED NOT DETECTED Final    Comment: (NOTE) The Coronavirus on the Respiratory Panel, DOES NOT test for the novel  Coronavirus (2019 nCoV)    Coronavirus HKU1 NOT DETECTED NOT DETECTED Final   Coronavirus NL63 NOT DETECTED NOT DETECTED Final   Coronavirus OC43 NOT DETECTED NOT DETECTED Final   Metapneumovirus NOT DETECTED NOT DETECTED Final   Rhinovirus / Enterovirus DETECTED (A) NOT DETECTED Final   Influenza A NOT DETECTED NOT DETECTED Final   Influenza B NOT DETECTED NOT DETECTED Final   Parainfluenza Virus 1 NOT DETECTED NOT DETECTED Final   Parainfluenza Virus 2 NOT DETECTED NOT DETECTED Final   Parainfluenza Virus 3 NOT DETECTED NOT DETECTED Final   Parainfluenza Virus 4 NOT DETECTED NOT DETECTED Final   Respiratory Syncytial Virus NOT DETECTED NOT DETECTED Final   Bordetella pertussis NOT DETECTED NOT DETECTED Final   Chlamydophila pneumoniae NOT DETECTED NOT DETECTED Final   Mycoplasma pneumoniae NOT DETECTED NOT DETECTED Final    Comment: Performed at [facility], [address], [place], [place] [address]     Time coordinating discharge: Over 30 minutes  SIGNED:   Burke Keels, MD  [facility] 03/03/2019, 10:43 AM Pager   If 7PM-7AM, please contact night-coverage [email or web address] Password [email or web address]

## 2019-03-03 NOTE — TOC Transition Note (Signed)
Transition of Care [facility]) - CM/SW Discharge Note   Patient Details  Name: BERNADINE MELECIO MRN: [id number] Date of Birth: Jun 24, 1950  [facility] CM/SW Contact:  Latanya Maudlin, RN Phone Number: 03/03/2019, 1:56 PM   Clinical Narrative:   DME oxygen orders. Referral to [facility] with [facility]. Plan for delivery to the room of portable tank and then complete home set up once patient is discharged.     Final next level of care: Home/Self Care Barriers to Discharge: No Barriers Identified   Patient Goals and CMS Choice   CMS Medicare.gov Compare Post Acute Care list provided to:: Patient Choice offered to / list presented to : Patient  Discharge Placement                       Discharge Plan and Services                DME Arranged: Oxygen DME Agency: [employer]                  Social Determinants of Health (SDOH) Interventions     Readmission Risk Interventions Readmission Risk Prevention Plan 03/03/2019  Transportation Screening Complete  PCP or Specialist Appt within 3-5 Days Complete  HRI or [place] Complete  Social Work Consult for [place] Planning/Counseling Complete  Palliative Care Screening Complete  Medication Review Press photographer) Complete  Some recent data might be hidden

## 2019-03-03 NOTE — Progress Notes (Signed)
Pharmacy Electrolyte Monitoring Consult:  Pharmacy consulted to assist in monitoring and replacing electrolytes in this 68 y.o. female admitted on 02/27/2019. Patient positive for rhinovirus. Patient with past medical history significant for CHF and presenting with pulmonary edema.   Labs:  Sodium (mmol/L)  Date Value  03/03/2019 140   Potassium (mmol/L)  Date Value  03/03/2019 3.7   Magnesium (mg/dL)  Date Value  03/02/2019 2.1   Phosphorus (mg/dL)  Date Value  06/03/2018 4.4   Calcium (mg/dL)  Date Value  03/03/2019 8.7 (L)   Albumin (g/dL)  Date Value  02/28/2019 3.5   Patient on furosemide 20 mg daily PTA per Med Rec  Assessment/Plan: K 3.7 Scr 0.48  Patient on furosemide 40mg  PO daily. Patient continued on spironolactone 25mg  daily. -Will order potassium 67mEq PO x 1.   Will replace orally in setting of diuresis/CHF.  Will replace for goal potassium ~ 4 and goal magnesium ~ 2.  Will obtain BMP/Magnesium with am labs.   Pharmacy will continue to monitor and adjust per consult.    Chinita Greenland PharmD Clinical Pharmacist 03/03/2019

## 2019-03-03 NOTE — Progress Notes (Signed)
SATURATION QUALIFICATIONS: (This note is used to comply with regulatory documentation for home oxygen)  Patient Saturations on Room Air at Rest = 90%  Patient Saturations on Room Air while Ambulating = 87%  Patient Saturations on 2 Liters of oxygen while Ambulating = 96%  Please briefly explain why patient needs home oxygen:

## 2019-03-04 LAB — CULTURE, BLOOD (ROUTINE X 2)
Culture: NO GROWTH
Culture: NO GROWTH
Special Requests: ADEQUATE
Special Requests: ADEQUATE

## 2019-03-30 ENCOUNTER — Emergency Department: Payer: Medicare HMO

## 2019-03-30 ENCOUNTER — Inpatient Hospital Stay
Admission: EM | Admit: 2019-03-30 | Discharge: 2019-04-02 | DRG: 871 | Disposition: A | Discharge status: Other Acute Inpatient Hospital | Payer: Medicare HMO | Attending: Pulmonary Disease | Admitting: Pulmonary Disease

## 2019-03-30 DIAGNOSIS — E1165 Type 2 diabetes mellitus with hyperglycemia: Secondary | ICD-10-CM | POA: Diagnosis present

## 2019-03-30 DIAGNOSIS — A4189 Other specified sepsis: Secondary | ICD-10-CM | POA: Diagnosis present

## 2019-03-30 DIAGNOSIS — I255 Ischemic cardiomyopathy: Secondary | ICD-10-CM | POA: Diagnosis present

## 2019-03-30 DIAGNOSIS — I5042 Chronic combined systolic (congestive) and diastolic (congestive) heart failure: Secondary | ICD-10-CM | POA: Diagnosis present

## 2019-03-30 DIAGNOSIS — T4275XA Adverse effect of unspecified antiepileptic and sedative-hypnotic drugs, initial encounter: Secondary | ICD-10-CM | POA: Diagnosis present

## 2019-03-30 DIAGNOSIS — I952 Hypotension due to drugs: Secondary | ICD-10-CM | POA: Diagnosis present

## 2019-03-30 DIAGNOSIS — Z953 Presence of xenogenic heart valve: Secondary | ICD-10-CM

## 2019-03-30 DIAGNOSIS — Z8673 Personal history of transient ischemic attack (TIA), and cerebral infarction without residual deficits: Secondary | ICD-10-CM

## 2019-03-30 DIAGNOSIS — U071 COVID-19: Secondary | ICD-10-CM | POA: Diagnosis present

## 2019-03-30 DIAGNOSIS — G4733 Obstructive sleep apnea (adult) (pediatric): Secondary | ICD-10-CM | POA: Diagnosis present

## 2019-03-30 DIAGNOSIS — N170 Acute kidney failure with tubular necrosis: Secondary | ICD-10-CM | POA: Diagnosis present

## 2019-03-30 DIAGNOSIS — J9600 Acute respiratory failure, unspecified whether with hypoxia or hypercapnia: Secondary | ICD-10-CM

## 2019-03-30 DIAGNOSIS — Z6841 Body Mass Index (BMI) 40.0 and over, adult: Secondary | ICD-10-CM

## 2019-03-30 DIAGNOSIS — I248 Other forms of acute ischemic heart disease: Secondary | ICD-10-CM | POA: Diagnosis present

## 2019-03-30 DIAGNOSIS — D689 Coagulation defect, unspecified: Secondary | ICD-10-CM | POA: Diagnosis present

## 2019-03-30 DIAGNOSIS — Z95810 Presence of automatic (implantable) cardiac defibrillator: Secondary | ICD-10-CM | POA: Diagnosis not present

## 2019-03-30 DIAGNOSIS — Z87891 Personal history of nicotine dependence: Secondary | ICD-10-CM

## 2019-03-30 DIAGNOSIS — Z7902 Long term (current) use of antithrombotics/antiplatelets: Secondary | ICD-10-CM | POA: Diagnosis not present

## 2019-03-30 DIAGNOSIS — Z79899 Other long term (current) drug therapy: Secondary | ICD-10-CM

## 2019-03-30 DIAGNOSIS — Z8674 Personal history of sudden cardiac arrest: Secondary | ICD-10-CM

## 2019-03-30 DIAGNOSIS — E872 Acidosis: Secondary | ICD-10-CM | POA: Diagnosis present

## 2019-03-30 DIAGNOSIS — A419 Sepsis, unspecified organism: Secondary | ICD-10-CM | POA: Diagnosis present

## 2019-03-30 DIAGNOSIS — Z7901 Long term (current) use of anticoagulants: Secondary | ICD-10-CM

## 2019-03-30 DIAGNOSIS — J1289 Other viral pneumonia: Secondary | ICD-10-CM | POA: Diagnosis present

## 2019-03-30 DIAGNOSIS — R6521 Severe sepsis with septic shock: Secondary | ICD-10-CM | POA: Diagnosis present

## 2019-03-30 DIAGNOSIS — J069 Acute upper respiratory infection, unspecified: Secondary | ICD-10-CM | POA: Diagnosis not present

## 2019-03-30 DIAGNOSIS — I2510 Atherosclerotic heart disease of native coronary artery without angina pectoris: Secondary | ICD-10-CM | POA: Diagnosis present

## 2019-03-30 DIAGNOSIS — Z794 Long term (current) use of insulin: Secondary | ICD-10-CM

## 2019-03-30 DIAGNOSIS — Z7982 Long term (current) use of aspirin: Secondary | ICD-10-CM

## 2019-03-30 DIAGNOSIS — J9602 Acute respiratory failure with hypercapnia: Secondary | ICD-10-CM | POA: Diagnosis present

## 2019-03-30 DIAGNOSIS — I252 Old myocardial infarction: Secondary | ICD-10-CM | POA: Diagnosis not present

## 2019-03-30 DIAGNOSIS — R6520 Severe sepsis without septic shock: Secondary | ICD-10-CM | POA: Diagnosis present

## 2019-03-30 DIAGNOSIS — J9601 Acute respiratory failure with hypoxia: Secondary | ICD-10-CM | POA: Diagnosis present

## 2019-03-30 DIAGNOSIS — J9690 Respiratory failure, unspecified, unspecified whether with hypoxia or hypercapnia: Secondary | ICD-10-CM

## 2019-03-30 DIAGNOSIS — I480 Paroxysmal atrial fibrillation: Secondary | ICD-10-CM | POA: Diagnosis present

## 2019-03-30 DIAGNOSIS — E785 Hyperlipidemia, unspecified: Secondary | ICD-10-CM | POA: Diagnosis present

## 2019-03-30 DIAGNOSIS — Z8249 Family history of ischemic heart disease and other diseases of the circulatory system: Secondary | ICD-10-CM

## 2019-03-30 DIAGNOSIS — G9340 Encephalopathy, unspecified: Secondary | ICD-10-CM | POA: Diagnosis present

## 2019-03-30 DIAGNOSIS — Z7951 Long term (current) use of inhaled steroids: Secondary | ICD-10-CM

## 2019-03-30 DIAGNOSIS — Z8701 Personal history of pneumonia (recurrent): Secondary | ICD-10-CM

## 2019-03-30 LAB — BLOOD GAS, ARTERIAL
Acid-base deficit: 6.2 mmol/L — ABNORMAL HIGH (ref 0.0–2.0)
Acid-base deficit: 1.4 mmol/L (ref 0.0–2.0)
Bicarbonate: 20.7 mmol/L (ref 20.0–28.0)
Bicarbonate: 25.2 mmol/L (ref 20.0–28.0)
FIO2: 70
FIO2: 70
MECHVT: 500 mL
MECHVT: 500 mL
Mechanical Rate: 16
Mechanical Rate: 16
O2 Saturation: 91.8 %
O2 Saturation: 98.9 %
PEEP: 5 cmH2O
PEEP: 5 cmH2O
Patient temperature: 37
Patient temperature: 37
pCO2 arterial: 45 mmHg (ref 32.0–48.0)
pCO2 arterial: 49 mmHg — ABNORMAL HIGH (ref 32.0–48.0)
pH, Arterial: 7.27 — ABNORMAL LOW (ref 7.350–7.450)
pH, Arterial: 7.32 — ABNORMAL LOW (ref 7.350–7.450)
pO2, Arterial: 72 mmHg — ABNORMAL LOW (ref 83.0–108.0)
pO2, Arterial: 136 mmHg — ABNORMAL HIGH (ref 83.0–108.0)

## 2019-03-30 LAB — COMPREHENSIVE METABOLIC PANEL
ALT: 56 U/L — ABNORMAL HIGH (ref 0–44)
AST: 112 U/L — ABNORMAL HIGH (ref 15–41)
Albumin: 3.9 g/dL (ref 3.5–5.0)
Alkaline Phosphatase: 203 U/L — ABNORMAL HIGH (ref 38–126)
Anion gap: 15 (ref 5–15)
BUN: 19 mg/dL (ref 8–23)
CO2: 19 mmol/L — ABNORMAL LOW (ref 22–32)
Calcium: 8.8 mg/dL — ABNORMAL LOW (ref 8.9–10.3)
Chloride: 103 mmol/L (ref 98–111)
Creatinine, Ser: 1.22 mg/dL — ABNORMAL HIGH (ref 0.44–1.00)
GFR calc Af Amer: 53 mL/min — ABNORMAL LOW (ref >60)
GFR calc non Af Amer: 46 mL/min — ABNORMAL LOW (ref >60)
Glucose, Bld: 377 mg/dL — ABNORMAL HIGH (ref 70–99)
Potassium: 4.5 mmol/L (ref 3.5–5.1)
Sodium: 137 mmol/L (ref 135–145)
Total Bilirubin: 1 mg/dL (ref 0.3–1.2)
Total Protein: 7.4 g/dL (ref 6.5–8.1)

## 2019-03-30 LAB — LACTATE DEHYDROGENASE: LDH: 353 U/L — ABNORMAL HIGH (ref 98–192)

## 2019-03-30 LAB — PROTIME-INR
INR: 1.9 — ABNORMAL HIGH (ref 0.8–1.2)
Prothrombin Time: 21.4 seconds — ABNORMAL HIGH (ref 11.4–15.2)

## 2019-03-30 LAB — CBC WITH DIFFERENTIAL/PLATELET
Abs Immature Granulocytes: 0.06 10*3/uL (ref 0.00–0.07)
Basophils Absolute: 0.1 10*3/uL (ref 0.0–0.1)
Basophils Relative: 1 %
Eosinophils Absolute: 0.1 10*3/uL (ref 0.0–0.5)
Eosinophils Relative: 1 %
HCT: 35.4 % — ABNORMAL LOW (ref 36.0–46.0)
Hemoglobin: 11 g/dL — ABNORMAL LOW (ref 12.0–15.0)
Immature Granulocytes: 1 %
Lymphocytes Relative: 30 %
Lymphs Abs: 3.4 10*3/uL (ref 0.7–4.0)
MCH: 25.4 pg — ABNORMAL LOW (ref 26.0–34.0)
MCHC: 31.1 g/dL (ref 30.0–36.0)
MCV: 81.8 fL (ref 80.0–100.0)
Monocytes Absolute: 0.5 10*3/uL (ref 0.1–1.0)
Monocytes Relative: 4 %
Neutro Abs: 7.2 10*3/uL (ref 1.7–7.7)
Neutrophils Relative %: 63 %
Platelets: 291 10*3/uL (ref 150–400)
RBC: 4.33 MIL/uL (ref 3.87–5.11)
RDW: 16.2 % — ABNORMAL HIGH (ref 11.5–15.5)
WBC: 11.3 10*3/uL — ABNORMAL HIGH (ref 4.0–10.5)
nRBC: 0 % (ref 0.0–0.2)

## 2019-03-30 LAB — FIBRINOGEN: Fibrinogen: 449 mg/dL (ref 210–475)

## 2019-03-30 LAB — PROCALCITONIN: Procalcitonin: <0.1 ng/mL

## 2019-03-30 LAB — POC SARS CORONAVIRUS 2 AG: SARS Coronavirus 2 Ag: POSITIVE — AB

## 2019-03-30 LAB — LACTIC ACID, PLASMA
Lactic Acid, Venous: 5.7 mmol/L (ref 0.5–1.9)
Lactic Acid, Venous: 2.2 mmol/L (ref 0.5–1.9)

## 2019-03-30 LAB — APTT: aPTT: 28 seconds (ref 24–36)

## 2019-03-30 LAB — BRAIN NATRIURETIC PEPTIDE: B Natriuretic Peptide: 1232 pg/mL — ABNORMAL HIGH (ref 0.0–100.0)

## 2019-03-30 LAB — TRIGLYCERIDES: Triglycerides: 75 mg/dL (ref <150)

## 2019-03-30 LAB — TROPONIN I (HIGH SENSITIVITY): Troponin I (High Sensitivity): 42 ng/L — ABNORMAL HIGH (ref <18)

## 2019-03-30 LAB — GLUCOSE, CAPILLARY: Glucose-Capillary: 120 mg/dL — ABNORMAL HIGH (ref 70–99)

## 2019-03-30 LAB — FIBRIN DERIVATIVES D-DIMER (ARMC ONLY): Fibrin derivatives D-dimer (ARMC): 5178.66 ng/mL (FEU) — ABNORMAL HIGH (ref 0.00–499.00)

## 2019-03-30 LAB — FERRITIN: Ferritin: 39 ng/mL (ref 11–307)

## 2019-03-30 MED ORDER — FAMOTIDINE IN NACL 20-0.9 MG/50ML-% IV SOLN
20.0000 mg | Freq: Two times a day (BID) | INTRAVENOUS | Status: DC
  Administered 2019-03-31 – 2019-04-02 (×6): 20 mg via INTRAVENOUS
  Filled 2019-03-30 (×6): qty 50

## 2019-03-30 MED ORDER — PROPOFOL 1000 MG/100ML IV EMUL
INTRAVENOUS | Status: AC
  Administered 2019-03-30: 30 ug/kg/min via INTRAVENOUS
  Filled 2019-03-30: qty 100

## 2019-03-30 MED ORDER — MIDAZOLAM 50MG/50ML (1MG/ML) PREMIX INFUSION
2.0000 mg/h | INTRAVENOUS | Status: DC
  Administered 2019-03-30: 2 mg/h via INTRAVENOUS
  Filled 2019-03-30: qty 50

## 2019-03-30 MED ORDER — SUCCINYLCHOLINE CHLORIDE 20 MG/ML IJ SOLN
INTRAMUSCULAR | Status: AC | PRN
  Administered 2019-03-30: 120 mg via INTRAVENOUS

## 2019-03-30 MED ORDER — PANTOPRAZOLE SODIUM 40 MG IV SOLR
40.0000 mg | Freq: Every day | INTRAVENOUS | Status: DC
  Administered 2019-03-30: 40 mg via INTRAVENOUS
  Filled 2019-03-30: qty 40

## 2019-03-30 MED ORDER — ONDANSETRON HCL 4 MG/2ML IJ SOLN
4.0000 mg | Freq: Four times a day (QID) | INTRAMUSCULAR | Status: DC | PRN
  Administered 2019-04-02: 4 mg via INTRAVENOUS
  Filled 2019-03-30: qty 2

## 2019-03-30 MED ORDER — INSULIN ASPART 100 UNIT/ML ~~LOC~~ SOLN
0.0000 [IU] | SUBCUTANEOUS | Status: DC
  Administered 2019-03-31 (×3): 2 [IU] via SUBCUTANEOUS
  Administered 2019-03-31: 16:00:00 1 [IU] via SUBCUTANEOUS
  Administered 2019-03-31 – 2019-04-01 (×2): 2 [IU] via SUBCUTANEOUS
  Administered 2019-04-01: 1 [IU] via SUBCUTANEOUS
  Administered 2019-04-01: 3 [IU] via SUBCUTANEOUS
  Administered 2019-04-01 (×2): 2 [IU] via SUBCUTANEOUS
  Administered 2019-04-02: 1 [IU] via SUBCUTANEOUS
  Administered 2019-04-02 (×2): 2 [IU] via SUBCUTANEOUS
  Filled 2019-03-30 (×15): qty 1

## 2019-03-30 MED ORDER — DEXTROSE-NACL 5-0.45 % IV SOLN
INTRAVENOUS | Status: DC
  Administered 2019-03-31 – 2019-04-01 (×3): via INTRAVENOUS

## 2019-03-30 MED ORDER — DEXAMETHASONE SODIUM PHOSPHATE 10 MG/ML IJ SOLN
6.0000 mg | INTRAMUSCULAR | Status: DC
  Administered 2019-03-31: 6 mg via INTRAVENOUS
  Filled 2019-03-30 (×2): qty 0.6

## 2019-03-30 MED ORDER — KETOROLAC TROMETHAMINE 30 MG/ML IJ SOLN
INTRAMUSCULAR | Status: AC
  Administered 2019-03-30: 15 mg via INTRAVENOUS
  Filled 2019-03-30: qty 1

## 2019-03-30 MED ORDER — FENTANYL CITRATE (PF) 100 MCG/2ML IJ SOLN
100.0000 ug | Freq: Once | INTRAMUSCULAR | Status: AC
  Administered 2019-03-30: 100 ug via INTRAVENOUS

## 2019-03-30 MED ORDER — KETOROLAC TROMETHAMINE 30 MG/ML IJ SOLN
15.0000 mg | Freq: Once | INTRAMUSCULAR | Status: AC
  Administered 2019-03-30: 23:00:00 15 mg via INTRAVENOUS

## 2019-03-30 MED ORDER — ETOMIDATE 2 MG/ML IV SOLN
INTRAVENOUS | Status: AC | PRN
  Administered 2019-03-30: 20 mg via INTRAVENOUS

## 2019-03-30 MED ORDER — FENTANYL 2500MCG IN NS 250ML (10MCG/ML) PREMIX INFUSION
0.0000 ug/h | INTRAVENOUS | Status: DC
  Filled 2019-03-30: qty 250

## 2019-03-30 MED ORDER — DEXAMETHASONE SODIUM PHOSPHATE 10 MG/ML IJ SOLN
6.0000 mg | Freq: Once | INTRAMUSCULAR | Status: AC
  Administered 2019-03-30: 6 mg via INTRAVENOUS
  Filled 2019-03-30: qty 1

## 2019-03-30 MED ORDER — ACETAMINOPHEN 325 MG PO TABS: 650.0000 mg | ORAL_TABLET | ORAL | Status: DC | PRN

## 2019-03-30 MED ORDER — SODIUM CHLORIDE 0.9 % IV SOLN
100.0000 mg | INTRAVENOUS | Status: DC
  Administered 2019-04-01 – 2019-04-02 (×2): 100 mg via INTRAVENOUS
  Filled 2019-03-30 (×3): qty 20

## 2019-03-30 MED ORDER — SODIUM CHLORIDE 0.9 % IV SOLN
1.0000 mg/kg/h | INTRAVENOUS | Status: DC
  Administered 2019-03-31: 1 mg/kg/h via INTRAVENOUS
  Filled 2019-03-30 (×4): qty 5

## 2019-03-30 MED ORDER — METRONIDAZOLE IN NACL 5-0.79 MG/ML-% IV SOLN
500.0000 mg | Freq: Once | INTRAVENOUS | Status: DC
  Administered 2019-03-30: 500 mg via INTRAVENOUS
  Filled 2019-03-30: qty 100

## 2019-03-30 MED ORDER — NITROGLYCERIN 2 % TD OINT
1.0000 [in_us] | TOPICAL_OINTMENT | Freq: Once | TRANSDERMAL | Status: AC
  Administered 2019-03-30: 1 [in_us] via TOPICAL
  Filled 2019-03-30: qty 1

## 2019-03-30 MED ORDER — PROPOFOL 1000 MG/100ML IV EMUL
5.0000 ug/kg/min | INTRAVENOUS | Status: DC
  Administered 2019-03-30: 21:00:00 30 ug/kg/min via INTRAVENOUS
  Filled 2019-03-30: qty 100

## 2019-03-30 MED ORDER — MIDAZOLAM HCL 2 MG/2ML IJ SOLN
2.0000 mg | Freq: Once | INTRAMUSCULAR | Status: AC
  Administered 2019-03-30: 2 mg via INTRAVENOUS

## 2019-03-30 MED ORDER — DEXAMETHASONE SODIUM PHOSPHATE 10 MG/ML IJ SOLN: 10.0000 mg | Freq: Once | INTRAMUSCULAR | Status: DC

## 2019-03-30 MED ORDER — VANCOMYCIN HCL IN DEXTROSE 1-5 GM/200ML-% IV SOLN
1000.0000 mg | Freq: Once | INTRAVENOUS | Status: DC
  Filled 2019-03-30: qty 200

## 2019-03-30 MED ORDER — SODIUM CHLORIDE 0.9 % IV SOLN
2.0000 g | Freq: Once | INTRAVENOUS | Status: AC
  Administered 2019-03-30: 2 g via INTRAVENOUS
  Filled 2019-03-30: qty 2

## 2019-03-30 MED ORDER — SODIUM CHLORIDE 0.9 % IV BOLUS
1000.0000 mL | Freq: Once | INTRAVENOUS | Status: AC
  Administered 2019-03-30: 1000 mL via INTRAVENOUS

## 2019-03-30 MED ORDER — SODIUM CHLORIDE 0.9 % IV SOLN
200.0000 mg | Freq: Once | INTRAVENOUS | Status: AC
  Administered 2019-03-31: 200 mg via INTRAVENOUS
  Filled 2019-03-30: qty 40

## 2019-03-30 MED ORDER — NOREPINEPHRINE 4 MG/250ML-% IV SOLN
0.0000 ug/min | INTRAVENOUS | Status: DC
  Administered 2019-03-31: 2 ug/min via INTRAVENOUS
  Filled 2019-03-30: qty 250

## 2019-03-30 NOTE — ED Notes (Signed)
Lactic acid 2.2 called from lab. Dr. Kerman Passey at bedside and notified. md at bedside placing central line.

## 2019-03-30 NOTE — ED Notes (Signed)
Alexis in lab notified of need for venipuncture assist for lactic acid.

## 2019-03-30 NOTE — ED Notes (Signed)
Called pharmacy to ask them to send up remdesivir and ketamine, per pharmacy they are mixing now and deliver it when it is ready

## 2019-03-30 NOTE — Progress Notes (Signed)
Patient arrived in extreme respiratory distress. On cpap per ems.  Placed on bipap briefly 16/5 100 r10 while preparing for intubation. Patient intubated and placed on vent without difficulty. VT 500 RR16 70% peep 5. Will continue to monitor.

## 2019-03-30 NOTE — ED Notes (Signed)
Pt continues to attempt to remove ETt AND IV LINES, SEDATION INCREASED.

## 2019-03-30 NOTE — Progress Notes (Signed)
PHARMACY -  BRIEF ANTIBIOTIC NOTE   Pharmacy has received consult(s) for Vancomycin and Cefepime from an ED provider.  The patient's profile has been reviewed for ht/wt/allergies/indication/available labs.    One time order(s) placed for Vancomycin 1g and Cefepime 2g x 1 dose each.  Further antibiotics/pharmacy consults should be ordered by admitting physician if indicated.                       Thank you, Pearla Dubonnet 03/30/2019  9:10 PM

## 2019-03-30 NOTE — ED Triage Notes (Signed)
Pt arrives from home with ems with resp distress. Pale, clammy, responsive only to painful stimuli. Pt cpaped by ems. md at bedside to intubate.

## 2019-03-30 NOTE — ED Provider Notes (Signed)
[facility] Emergency Department Provider Note  Time seen: 9:07 PM  I have reviewed the triage vital signs and the nursing notes.   HISTORY  Chief Complaint Shortness of Breath   HPI Desiree Mccall is a 68 y.o. female with a past medical history of CHF, diabetes, prior MI, prior cardiac arrest, presents to the emergency department for acute respiratory failure.  According to EMS they were called out by the patient for respiratory distress.  Patient could not even talk to the 911 operator due to significant respiratory distress.  EMS states upon arrival patient quite tachypneic diaphoretic pale, they cannot get a O2 sat on room air placed the patient on CPAP and transferred to the hospital.  Upon arrival patient remains on CPAP with significant respiratory distress unable to speak trying to shake her head yes or no to answer questions.  Despite being on CPAP 100% oxygen patient continued to be in significant respiratory distress.  Decision was made to intubate upon arrival.  Patient also found to be febrile upon arrival to the emergency department tachypneic and tachycardic.  Per record review patient has a history of rhino/enterovirus pneumonia at the beginning of this month.   Past Medical History:  Diagnosis Date  . Aortic dissection (HCC) 2010  . CHF (congestive heart failure) (HCC)   . Diabetes mellitus without complication (HCC)   . [name]   . Heart attack (HCC) sept. 25, 2010    Patient Active Problem List   Diagnosis Date Noted  . Fever of unknown origin 02/27/2019  . Chronic systolic CHF (congestive heart failure) (HCC) 05/28/2018  . Screening for malignant neoplasm of respiratory organ 07/19/2017  . H/O acute myocardial infarction 05/30/2017  . H/O: depression 05/30/2017  . Severe tobacco use disorder 05/30/2017  . Carotid stenosis, asymptomatic, left 05/04/2017  . OSA on CPAP 05/04/2017  . 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  Cat bite of forearm 11/22/2014  . Diabetes mellitus type 2, noninsulin dependent (HCC) 05/08/2014  . Syncope 04/10/2013  . Cardiac arrest (HCC) 04/10/2013  . Automatic implantable cardioverter-defibrillator in situ 07/13/2012  . Aortic dissection (HCC) 11/22/2011  . CAD (coronary artery disease) 11/22/2011  . Dyslipidemia 11/22/2011  . History of stroke 11/22/2011  . Ischemic cardiomyopathy 11/22/2011  . Lumbar disc disease 11/22/2011  . PAD (peripheral artery disease) (HCC) 11/22/2011  . Pancreatitis due to common bile duct stone 11/22/2011    Past Surgical History:  Procedure Laterality Date  . ABDOMINAL HYSTERECTOMY    . CARDIAC SURGERY    . CAROTID STENT  2010  . CHOLECYSTECTOMY    . LEFT HEART CATH AND CORONARY ANGIOGRAPHY N/A 06/05/2018   Procedure: LEFT HEART CATH AND CORONARY ANGIOGRAPHY;  Surgeon: Laurier NancyKhan, Shaukat A, MD;  Location: [facility] INVASIVE CV LAB;  Service: Cardiovascular;  Laterality: N/A;  . STENT PLACE LEFT URETER ([facility] HX)      Prior to Admission medications   Medication Sig Start Date End Date Taking? Authorizing Provider  acetaminophen (TYLENOL) 325 MG tablet Take 2 tablets (650 mg total) by mouth every 4 (four) hours as needed for mild pain (temp > 101.5). 06/05/18   Harlon DittyKeene, Jeremiah D, NP  albuterol (PROVENTIL) (2.5 MG/3ML) 0.083% nebulizer solution Take 3 mLs (2.5 mg total) by nebulization every 2 (two) hours as needed for wheezing or shortness of breath. 06/05/18   Harlon DittyKeene, Jeremiah D, NP  aspirin EC 81 MG tablet Take 81 mg by mouth daily. 10/29/18 10/29/19  [provider]  atorvastatin (LIPITOR) 40 MG tablet Take 1 tablet (40 mg total) by mouth daily at 6 PM. 06/06/18   Judithe Modest, NP  benzonatate (TESSALON PERLES) 100 MG capsule Take 1 capsule (100 mg total) by mouth every 6 (six) hours as needed for cough. 03/03/19 03/02/20  Spongberg, Susy Frizzle, MD  bisacodyl (DULCOLAX) 10 MG suppository Place 1 suppository (10 mg total) rectally daily as needed for  moderate constipation. 03/03/19   Spongberg, Susy Frizzle, MD  budesonide (PULMICORT) 0.5 MG/2ML nebulizer solution Take 2 mLs (0.5 mg total) by nebulization 2 (two) times daily. 03/03/19   Spongberg, Susy Frizzle, MD  carvedilol (COREG) 6.25 MG tablet Take 1 tablet (6.25 mg total) by mouth 2 (two) times daily with a meal. Patient not taking: Reported on 02/27/2019 06/06/18   Judithe Modest, NP  clopidogrel (PLAVIX) 75 MG tablet Take 1 tablet (75 mg total) by mouth every morning. 06/06/18   Judithe Modest, NP  docusate sodium (COLACE) 100 MG capsule Take 1 capsule (100 mg total) by mouth daily. 06/06/18   Judithe Modest, NP  ENSURE MAX PROTEIN (ENSURE MAX PROTEIN) LIQD Take 330 mLs (11 oz total) by mouth 2 (two) times daily. 06/05/18   Judithe Modest, NP  famotidine (PEPCID) 20 MG tablet Take 1 tablet (20 mg total) by mouth 2 (two) times daily. 06/05/18   Judithe Modest, NP  FLUoxetine (PROZAC) 20 MG capsule Take 2 capsules by mouth daily.  03/12/15   [provider]  fluticasone (FLONASE) 50 MCG/ACT nasal spray Place 1 spray into the nose daily. 01/04/18   [provider]  furosemide (LASIX) 20 MG tablet Take 20 mg by mouth daily. 12/06/18   [provider]  Insulin Detemir (LEVEMIR FLEXTOUCH) 100 UNIT/ML Pen Inject 30 Units into the skin daily. 10/27/18   [provider]  losartan (COZAAR) 50 MG tablet Take 100 mg by mouth daily. 01/02/19   [provider]  metFORMIN (GLUCOPHAGE) 1000 MG tablet Take 1,000 mg by mouth 2 (two) times daily. 02/26/19   [provider]  metoprolol succinate (TOPROL-XL) 100 MG 24 hr tablet Take 100 mg by mouth daily. 10/22/18   [provider]  montelukast (SINGULAIR) 10 MG tablet Take 10 mg by mouth at bedtime. 12/11/18   [provider]  Multiple Vitamin (MULTIVITAMIN WITH MINERALS) TABS tablet Take 1 tablet by mouth daily. 06/06/18   Judithe Modest, NP  pantoprazole (PROTONIX) 40 MG tablet Take 1  tablet (40 mg total) by mouth at bedtime. 06/05/18   Judithe Modest, NP  potassium chloride SA (K-DUR,KLOR-CON) 20 MEQ tablet Take 1 tablet (20 mEq total) by mouth every 2 (two) hours. Patient not taking: Reported on 02/27/2019 06/05/18   Judithe Modest, NP  rivaroxaban (XARELTO) 20 MG TABS tablet Take 20 mg by mouth daily.    [provider]  senna-docusate (SENOKOT-S) 8.6-50 MG tablet Take 1 tablet by mouth 2 (two) times daily as needed for mild constipation. 06/05/18   Harlon Ditty D, NP  spironolactone (ALDACTONE) 25 MG tablet Take 1 tablet (25 mg total) by mouth daily. Patient not taking: Reported on 02/27/2019 06/06/18   Judithe Modest, NP    [name]  Family History  Problem Relation Age of Onset  . Heart failure Mother   . Heart failure Father     Social History Social History   Tobacco Use  . Smoking status: Former Smoker    Packs/day: 0.50  Types: Cigarettes    Start date: 2012  . Smokeless tobacco: Never Used  Substance Use Topics  . Alcohol use: Yes    Comment: occasionally  . Drug use: No    Review of Systems Unable to obtain an adequate/accurate review of system secondary to significant respiratory distress/respiratory failure.  ____________________________________________   PHYSICAL EXAM:  VITAL SIGNS: ED Triage Vitals  Enc Vitals Group     BP 03/30/19 2100 114/89     Pulse Rate 03/30/19 2100 (!) 102     Resp 03/30/19 2100 (!) 24     Temp 03/30/19 2102 100.2 F (37.9 C)     Temp Source 03/30/19 2102 Bladder     SpO2 03/30/19 2100 95 %     Weight --      Height --      Head Circumference --      Peak Flow --      Pain Score --      Pain Loc --      Pain Edu? --      Excl. in GC? --     Constitutional: Patient is awake and alert but with significant respiratory distress unable to answer most questions, gasping for air despite being on CPAP on high percent O2. Eyes: Normal exam ENT      Head: Normocephalic and  atraumatic.      Mouth/Throat: Dry appearing mucous membranes. Cardiovascular: Regular rhythm rate around 120 bpm. Respiratory: Patient with significant tachypnea around 30+ breaths per minute.  Appears to have equal breath sounds although diminished bilaterally.  No obvious rales or rhonchi. Gastrointestinal: Soft and nontender. No distention. Musculoskeletal: Nontender with normal range of motion in all extremities Neurologic: Respiratory distress unable to assess appropriately neurologically, moving all extremities. Skin: Skin is pale in appearance and moderately diaphoretic. Psychiatric: Mood and affect are normal.   ____________________________________________    EKG  EKG viewed and interpreted by myself shows sinus tachycardia 111 bpm with a widened QRS, normal axis, QTC prolongation otherwise normal intervals nonspecific ST changes.  ____________________________________________    RADIOLOGY  X-ray shows bilateral opacities concerning for pneumonia.  ____________________________________________   INITIAL IMPRESSION / ASSESSMENT AND PLAN / ED COURSE  Pertinent labs & imaging results that were available during my care of the patient were reviewed by me and considered in my medical decision making (see chart for details).   Patient presents to the emergency department with significant respiratory distress, gasping for air despite being on CPAP appears quite fatigued.  Decision was made by myself upon arrival to intubate the patient.  Intubation performed without issue.  Patient found to be febrile following the intubation.  Per record review patient was diagnosed with pneumonia 02/27/2019 and was hospitalized at that time in the ICU on BiPAP.  At that time Covid was negative and enterovirus/rhinovirus was positive.  We have no history in the interim.  Given the patient's fever with tachypnea and tachycardia and respiratory failure we will check labs, cultures start on broad-spectrum  antibiotics, swab a rapid Covid test and continue to closely monitor.  Patient will obviously require admission to an ICU once her ER work-up has been completed.  Patient's rapid Covid test is positive.  X-ray consistent with bilateral pneumonia.  Spoke with charge nurse, [facility] is full and is not excepting patients.  We will admit to our intensive care unit.  Patient intubated on propofol.  Patient's blood pressure dropped we will order Levophed, bolused a liter of fluids.  Discussed with the hospitalist who initially agreed to admit the patient.  However upon further discussion between the hospitalist and the intensivist she states the intensivist think she is too sick to be admitted to our ICU as there is no beds available until 7 AM and she would likely remain in the emergency department overnight.  Discussed the patient with [facility] they also have no beds in the ICU.  [facility] also has no beds in the ICU.  Discussed once again with our intensivist nurse practitioner who agrees to admit the patient locally we will keep in the emergency department.  As patient is quite ill I went ahead and placed a triple-lumen femoral catheter for access as needed for sedation, possible pressors.  However patient's blood pressure has since, up to 111 systolic prior to Levophed being started.  Discussed with charge nurse, they have discussed with house supervisor states there are beds available in the ICU but there is no nurse to staff them tonight.  They are going to work very hard to open up a bed in the ICU for this patient.  Otherwise they state there will be beds available at 7 AM.  Desiree Merl was evaluated in Emergency Department on 03/30/2019 for the symptoms described in the history of present illness. She was evaluated in the context of the global COVID-19 pandemic, which necessitated consideration that the patient might be at risk for infection with the SARS-CoV-2 virus that causes  COVID-19. Institutional protocols and algorithms that pertain to the evaluation of patients at risk for COVID-19 are in a state of rapid change based on information released by regulatory bodies including the CDC and federal and state organizations. These policies and algorithms were followed during the patient's care in the ED.  INTUBATION Performed by: Minna Antis  Required items: required blood products, implants, devices, and special equipment available Patient identity confirmed: provided demographic data and hospital-assigned identification number Time out: Immediately prior to procedure a "time out" was called to verify the correct patient, procedure, equipment, support staff and site/side marked as required.  Indications: Respiratory failure  Intubation method: s4 Glidescope Laryngoscopy   Preoxygenation: 100%BVM  Sedatives: Etomidate Paralytic:  Succinylcholine  Tube Size: 7.5 cuffed  Post-procedure assessment: chest rise and ETCO2 monitor Breath sounds: equal and absent over the epigastrium Tube secured with: ETT holder Chest x-ray interpreted by radiologist and me.  Chest x-ray findings: endotracheal tube in appropriate position  Patient tolerated the procedure well with no immediate complications.  CENTRAL LINE Performed by: Minna Antis Consent: The procedure was performed in an emergent situation. Required items: required blood products, implants, devices, and special equipment available Patient identity confirmed: arm band and provided demographic data Time out: Immediately prior to procedure a "time out" was called to verify the correct patient, procedure, equipment, support staff and site/side marked as required. Indications: vascular access Anesthesia: local infiltration Local anesthetic: lidocaine 1% with epinephrine Anesthetic total: 3 ml Patient sedated: no Preparation: skin prepped with 2% chlorhexidine Skin prep agent dried: skin  prep agent completely dried prior to procedure Sterile barriers: all five maximum sterile barriers used - cap, mask, sterile gown, sterile gloves, and large sterile sheet Hand hygiene: hand hygiene performed prior to central venous catheter insertion  Location details: right femoral   Catheter type: triple lumen Catheter size: 8 Fr Pre-procedure: landmarks identified Ultrasound guidance: Yes Successful placement: yes Post-procedure: line sutured and dressing applied Assessment: blood return through all parts, free fluid flow, placement verified by  x-ray and no pneumothorax on x-ray Patient tolerance: Patient tolerated the procedure well with no immediate complications.    CRITICAL CARE Performed by: Harvest Dark   Total critical care time: 45 minutes  Critical care time was exclusive of separately billable procedures and treating other patients.  Critical care was necessary to treat or prevent imminent or life-threatening deterioration.  Critical care was time spent personally by me on the following activities: development of treatment plan with patient and/or surrogate as well as nursing, discussions with consultants, evaluation of patient's response to treatment, examination of patient, obtaining history from patient or surrogate, ordering and performing treatments and interventions, ordering and review of laboratory studies, ordering and review of radiographic studies, pulse oximetry and re-evaluation of patient's condition.   ____________________________________________   FINAL CLINICAL IMPRESSION(S) / ED DIAGNOSES  Respiratory failure COVID-19   Harvest Dark, MD 03/31/19 0006

## 2019-03-30 NOTE — Progress Notes (Signed)
Remdesivir - Pharmacy Brief Note   O:  ALT:  CXR:  SpO2: 96% on cpap   A/P:  Remdesivir 200 mg IVPB once followed by 100 mg IVPB daily x 4 days.   Hart Robinsons, PharmD Clinical Pharmacist 03/30/2019  03/30/2019 11:27 PM

## 2019-03-30 NOTE — ED Notes (Signed)
Pt agitated and pulled out IV on right hand. Pt propofol no longer running. Cefepime paused and IV on left hand flushed. Propofol transferred to left hand and bolus of 20 performed. Dr Kerman Passey notified and received verbal orders for 20 mg midazolam and 100 mg fentanyl.

## 2019-03-30 NOTE — Progress Notes (Signed)
CODE SEPSIS - PHARMACY COMMUNICATION  **Broad Spectrum Antibiotics should be administered within 1 hour of Sepsis diagnosis**  Time Code Sepsis Called/Page Received: 2104   Antibiotics Ordered: Cefepime, Vancomycin, Metronidazole  Time of 1st antibiotic administration: 2138  Additional action taken by pharmacy: none  If necessary, Name of Provider/Nurse Contacted: n/a    Pearla Dubonnet ,PharmD Clinical Pharmacist  03/30/2019  9:43 PM

## 2019-03-30 NOTE — ED Notes (Signed)
Pt arrived on bipap, with obvious labored breathing. Pt unresponsive. Dr Kerman Passey at bedside. 2054: 20mg  atomidate given 2055: 120mg  Sux given  2055: Pt respirations assisted by ambu bag 2056: 7.5 fr ET tube placed using glideoscope marked 22 at the lip. Pt transitioned to vent. 2301: 16 fr OG tube placed marked 60 at the lip.

## 2019-03-31 ENCOUNTER — Inpatient Hospital Stay: Payer: Medicare HMO

## 2019-03-31 DIAGNOSIS — I255 Ischemic cardiomyopathy: Secondary | ICD-10-CM

## 2019-03-31 DIAGNOSIS — Z95810 Presence of automatic (implantable) cardiac defibrillator: Secondary | ICD-10-CM

## 2019-03-31 DIAGNOSIS — J069 Acute upper respiratory infection, unspecified: Secondary | ICD-10-CM

## 2019-03-31 DIAGNOSIS — A419 Sepsis, unspecified organism: Secondary | ICD-10-CM

## 2019-03-31 DIAGNOSIS — R6521 Severe sepsis with septic shock: Secondary | ICD-10-CM

## 2019-03-31 DIAGNOSIS — U071 COVID-19: Secondary | ICD-10-CM | POA: Insufficient documentation

## 2019-03-31 DIAGNOSIS — J9600 Acute respiratory failure, unspecified whether with hypoxia or hypercapnia: Secondary | ICD-10-CM | POA: Insufficient documentation

## 2019-03-31 LAB — BLOOD GAS, ARTERIAL
Acid-base deficit: 3.1 mmol/L — ABNORMAL HIGH (ref 0.0–2.0)
Acid-base deficit: 4 mmol/L — ABNORMAL HIGH (ref 0.0–2.0)
Acid-base deficit: 2.7 mmol/L — ABNORMAL HIGH (ref 0.0–2.0)
Acid-base deficit: 1.1 mmol/L (ref 0.0–2.0)
Allens test (pass/fail): POSITIVE — AB
Allens test (pass/fail): POSITIVE — AB
Bicarbonate: 25 mmol/L (ref 20.0–28.0)
Bicarbonate: 25.6 mmol/L (ref 20.0–28.0)
Bicarbonate: 24.5 mmol/L (ref 20.0–28.0)
Bicarbonate: 24.8 mmol/L (ref 20.0–28.0)
FIO2: 60
FIO2: 0.4
FIO2: 0.4
FIO2: 0.4
MECHVT: 500 mL
MECHVT: 500 mL
O2 Saturation: 98.9 %
O2 Saturation: 92.7 %
O2 Saturation: 93.5 %
O2 Saturation: 97.6 %
PEEP: 5 cmH2O
PEEP: 8 cmH2O
PEEP: 8 cmH2O
PEEP: 5 cmH2O
Patient temperature: 37
Patient temperature: 37
Patient temperature: 37
Patient temperature: 37
Pressure control: 28 cmH2O
RATE: 16 resp/min
RATE: 20 resp/min
RATE: 26 resp/min
RATE: 22 resp/min
pCO2 arterial: 57 mmHg — ABNORMAL HIGH (ref 32.0–48.0)
pCO2 arterial: 67 mmHg (ref 32.0–48.0)
pCO2 arterial: 51 mmHg — ABNORMAL HIGH (ref 32.0–48.0)
pCO2 arterial: 45 mmHg (ref 32.0–48.0)
pH, Arterial: 7.25 — ABNORMAL LOW (ref 7.350–7.450)
pH, Arterial: 7.19 — CL (ref 7.350–7.450)
pH, Arterial: 7.29 — ABNORMAL LOW (ref 7.350–7.450)
pH, Arterial: 7.35 (ref 7.350–7.450)
pO2, Arterial: 145 mmHg — ABNORMAL HIGH (ref 83.0–108.0)
pO2, Arterial: 81 mmHg — ABNORMAL LOW (ref 83.0–108.0)
pO2, Arterial: 77 mmHg — ABNORMAL LOW (ref 83.0–108.0)
pO2, Arterial: 103 mmHg (ref 83.0–108.0)

## 2019-03-31 LAB — PROCALCITONIN: Procalcitonin: 7.21 ng/mL

## 2019-03-31 LAB — BASIC METABOLIC PANEL
Anion gap: 9 (ref 5–15)
BUN: 21 mg/dL (ref 8–23)
CO2: 22 mmol/L (ref 22–32)
Calcium: 8.1 mg/dL — ABNORMAL LOW (ref 8.9–10.3)
Chloride: 109 mmol/L (ref 98–111)
Creatinine, Ser: 0.77 mg/dL (ref 0.44–1.00)
GFR calc Af Amer: >60 mL/min (ref >60)
GFR calc non Af Amer: >60 mL/min (ref >60)
Glucose, Bld: 173 mg/dL — ABNORMAL HIGH (ref 70–99)
Potassium: 4.1 mmol/L (ref 3.5–5.1)
Sodium: 140 mmol/L (ref 135–145)

## 2019-03-31 LAB — GLUCOSE, CAPILLARY
Glucose-Capillary: 157 mg/dL — ABNORMAL HIGH (ref 70–99)
Glucose-Capillary: 160 mg/dL — ABNORMAL HIGH (ref 70–99)
Glucose-Capillary: 144 mg/dL — ABNORMAL HIGH (ref 70–99)
Glucose-Capillary: 166 mg/dL — ABNORMAL HIGH (ref 70–99)

## 2019-03-31 LAB — TROPONIN I (HIGH SENSITIVITY)
Troponin I (High Sensitivity): 76 ng/L — ABNORMAL HIGH (ref <18)
Troponin I (High Sensitivity): 63 ng/L — ABNORMAL HIGH (ref <18)

## 2019-03-31 LAB — C-REACTIVE PROTEIN
CRP: 1.1 mg/dL — ABNORMAL HIGH (ref <1.0)
CRP: 4.7 mg/dL — ABNORMAL HIGH (ref <1.0)

## 2019-03-31 LAB — MRSA PCR SCREENING: MRSA by PCR: NEGATIVE

## 2019-03-31 LAB — CBC
HCT: 29.1 % — ABNORMAL LOW (ref 36.0–46.0)
Hemoglobin: 9.1 g/dL — ABNORMAL LOW (ref 12.0–15.0)
MCH: 25.4 pg — ABNORMAL LOW (ref 26.0–34.0)
MCHC: 31.3 g/dL (ref 30.0–36.0)
MCV: 81.3 fL (ref 80.0–100.0)
Platelets: 201 10*3/uL (ref 150–400)
RBC: 3.58 MIL/uL — ABNORMAL LOW (ref 3.87–5.11)
RDW: 16.3 % — ABNORMAL HIGH (ref 11.5–15.5)
WBC: 11 10*3/uL — ABNORMAL HIGH (ref 4.0–10.5)
nRBC: 0 % (ref 0.0–0.2)

## 2019-03-31 LAB — HEPARIN LEVEL (UNFRACTIONATED): Heparin Unfractionated: 1.76 IU/mL — ABNORMAL HIGH (ref 0.30–0.70)

## 2019-03-31 LAB — LACTIC ACID, PLASMA: Lactic Acid, Venous: 0.8 mmol/L (ref 0.5–1.9)

## 2019-03-31 LAB — APTT: aPTT: 44 seconds — ABNORMAL HIGH (ref 24–36)

## 2019-03-31 MED ORDER — MIDAZOLAM HCL 2 MG/2ML IJ SOLN: 1.0000 mg | INTRAMUSCULAR | Status: DC | PRN

## 2019-03-31 MED ORDER — SODIUM CHLORIDE 0.9 % IV SOLN
2.0000 g | Freq: Three times a day (TID) | INTRAVENOUS | Status: DC
  Administered 2019-03-31 – 2019-04-02 (×7): 2 g via INTRAVENOUS
  Filled 2019-03-31 (×11): qty 2

## 2019-03-31 MED ORDER — HEPARIN SODIUM (PORCINE) 5000 UNIT/ML IJ SOLN
5000.0000 [IU] | Freq: Three times a day (TID) | INTRAMUSCULAR | Status: DC
  Administered 2019-03-31: 06:00:00 5000 [IU] via SUBCUTANEOUS
  Filled 2019-03-31: qty 1

## 2019-03-31 MED ORDER — CHOLECALCIFEROL 10 MCG/ML (400 UNIT/ML) PO LIQD
2000.0000 [IU] | Freq: Every day | ORAL | Status: DC
  Administered 2019-03-31 – 2019-04-02 (×3): 2000 [IU]
  Filled 2019-03-31 (×3): qty 5

## 2019-03-31 MED ORDER — MIDAZOLAM HCL 2 MG/2ML IJ SOLN
1.0000 mg | INTRAMUSCULAR | Status: DC | PRN
  Administered 2019-04-01: 2 mg via INTRAVENOUS

## 2019-03-31 MED ORDER — SODIUM CHLORIDE 0.9% FLUSH
10.0000 mL | Freq: Two times a day (BID) | INTRAVENOUS | Status: DC
  Administered 2019-03-31 – 2019-04-02 (×4): 10 mL

## 2019-03-31 MED ORDER — SODIUM CHLORIDE 0.9 % IV SOLN
0.0000 ug/min | INTRAVENOUS | Status: AC
  Filled 2019-03-31: qty 4

## 2019-03-31 MED ORDER — FUROSEMIDE 10 MG/ML IJ SOLN
20.0000 mg | Freq: Once | INTRAMUSCULAR | Status: AC
  Administered 2019-03-31: 12:00:00 20 mg via INTRAVENOUS
  Filled 2019-03-31: qty 2

## 2019-03-31 MED ORDER — MIDAZOLAM 50MG/50ML (1MG/ML) PREMIX INFUSION
0.5000 mg/h | INTRAVENOUS | Status: DC
  Administered 2019-03-31 – 2019-04-01 (×2): 0.5 mg/h via INTRAVENOUS
  Filled 2019-03-31 (×2): qty 50

## 2019-03-31 MED ORDER — SODIUM CHLORIDE 0.9 % IV SOLN
2.0000 g | Freq: Two times a day (BID) | INTRAVENOUS | Status: DC
  Filled 2019-03-31 (×2): qty 2

## 2019-03-31 MED ORDER — FENTANYL CITRATE (PF) 100 MCG/2ML IJ SOLN: 25.0000 ug | Freq: Once | INTRAMUSCULAR | Status: DC

## 2019-03-31 MED ORDER — SENNOSIDES 8.8 MG/5ML PO SYRP
5.0000 mL | ORAL_SOLUTION | Freq: Two times a day (BID) | ORAL | Status: DC | PRN
  Filled 2019-03-31: qty 5

## 2019-03-31 MED ORDER — BUDESONIDE 0.25 MG/2ML IN SUSP
0.2500 mg | Freq: Two times a day (BID) | RESPIRATORY_TRACT | Status: DC
  Administered 2019-03-31 – 2019-04-01 (×2): 0.25 mg via RESPIRATORY_TRACT
  Filled 2019-03-31 (×2): qty 2

## 2019-03-31 MED ORDER — BISACODYL 10 MG RE SUPP: 10.0000 mg | Freq: Every day | RECTAL | Status: DC | PRN

## 2019-03-31 MED ORDER — NOREPINEPHRINE BITARTRATE 1 MG/ML IV SOLN
0.0000 ug/min | INTRAVENOUS | Status: DC
  Filled 2019-03-31: qty 4

## 2019-03-31 MED ORDER — SODIUM CHLORIDE 0.9% FLUSH: 10.0000 mL | INTRAVENOUS | Status: DC | PRN

## 2019-03-31 MED ORDER — FUROSEMIDE 10 MG/ML IJ SOLN
INTRAMUSCULAR | Status: AC
  Filled 2019-03-31: qty 2

## 2019-03-31 MED ORDER — INSULIN GLARGINE 100 UNIT/ML ~~LOC~~ SOLN
20.0000 [IU] | Freq: Every day | SUBCUTANEOUS | Status: DC
  Administered 2019-03-31 – 2019-04-02 (×3): 20 [IU] via SUBCUTANEOUS
  Filled 2019-03-31 (×5): qty 0.2

## 2019-03-31 MED ORDER — FENTANYL BOLUS VIA INFUSION
25.0000 ug | INTRAVENOUS | Status: DC | PRN
  Filled 2019-03-31: qty 25

## 2019-03-31 MED ORDER — ADULT MULTIVITAMIN LIQUID CH
15.0000 mL | Freq: Every day | ORAL | Status: DC
  Administered 2019-03-31 – 2019-04-02 (×3): 15 mL
  Filled 2019-03-31 (×3): qty 15

## 2019-03-31 MED ORDER — FENTANYL 2500MCG IN NS 250ML (10MCG/ML) PREMIX INFUSION
0.0000 ug/h | INTRAVENOUS | Status: DC
  Administered 2019-04-01: 20 ug/h via INTRAVENOUS
  Filled 2019-03-31 (×3): qty 250

## 2019-03-31 MED ORDER — DOPAMINE-DEXTROSE 3.2-5 MG/ML-% IV SOLN
0.0000 ug/kg/min | INTRAVENOUS | Status: DC
  Administered 2019-03-31: 3 ug/kg/min via INTRAVENOUS
  Filled 2019-03-31: qty 250

## 2019-03-31 MED ORDER — HEPARIN (PORCINE) 25000 UT/250ML-% IV SOLN
1300.0000 [IU]/h | INTRAVENOUS | Status: DC
  Administered 2019-03-31: 1000 [IU]/h via INTRAVENOUS
  Administered 2019-04-01: 1300 [IU]/h via INTRAVENOUS
  Filled 2019-03-31 (×2): qty 250

## 2019-03-31 MED ORDER — VITAMIN B-1 100 MG PO TABS
100.0000 mg | ORAL_TABLET | Freq: Every day | ORAL | Status: DC
  Administered 2019-03-31 – 2019-04-02 (×3): 100 mg
  Filled 2019-03-31 (×3): qty 1

## 2019-03-31 MED ORDER — NOREPINEPHRINE 4 MG/250ML-% IV SOLN
0.0000 ug/min | INTRAVENOUS | Status: DC
  Filled 2019-03-31: qty 250

## 2019-03-31 MED ORDER — IPRATROPIUM-ALBUTEROL 0.5-2.5 (3) MG/3ML IN SOLN
3.0000 mL | RESPIRATORY_TRACT | Status: DC
  Administered 2019-03-31 – 2019-04-01 (×7): 3 mL via RESPIRATORY_TRACT
  Filled 2019-03-31 (×7): qty 3

## 2019-03-31 MED ORDER — FENTANYL BOLUS VIA INFUSION
25.0000 ug | INTRAVENOUS | Status: DC | PRN
  Administered 2019-04-01 (×2): 50 ug via INTRAVENOUS
  Filled 2019-03-31: qty 50

## 2019-03-31 MED ORDER — VITAL HIGH PROTEIN PO LIQD
1000.0000 mL | ORAL | Status: DC
  Administered 2019-03-31: 14:00:00 1000 mL
  Administered 2019-03-31: 22:00:00
  Administered 2019-04-01: 1000 mL

## 2019-03-31 MED ORDER — LACTATED RINGERS IV BOLUS: 1000.0000 mL | Freq: Once | INTRAVENOUS | Status: DC

## 2019-03-31 MED ORDER — SODIUM CHLORIDE 0.9 % IV SOLN
100.0000 mg | Freq: Two times a day (BID) | INTRAVENOUS | Status: DC
  Administered 2019-03-31 – 2019-04-01 (×3): 100 mg via INTRAVENOUS
  Filled 2019-03-31 (×4): qty 100

## 2019-03-31 MED ORDER — CHLORHEXIDINE GLUCONATE CLOTH 2 % EX PADS
6.0000 | MEDICATED_PAD | Freq: Every day | CUTANEOUS | Status: DC
  Administered 2019-03-31 – 2019-04-02 (×3): 6 via TOPICAL

## 2019-03-31 MED ORDER — ZINC SULFATE 220 (50 ZN) MG PO CAPS
220.0000 mg | ORAL_CAPSULE | Freq: Every day | ORAL | Status: DC
  Administered 2019-03-31 – 2019-04-02 (×3): 220 mg
  Filled 2019-03-31 (×3): qty 1

## 2019-03-31 MED ORDER — ORAL CARE MOUTH RINSE
15.0000 mL | OROMUCOSAL | Status: DC
  Administered 2019-03-31 – 2019-04-01 (×9): 15 mL via OROMUCOSAL

## 2019-03-31 MED ORDER — CHLORHEXIDINE GLUCONATE 0.12% ORAL RINSE (MEDLINE KIT)
15.0000 mL | Freq: Two times a day (BID) | OROMUCOSAL | Status: DC
  Administered 2019-03-31 – 2019-04-01 (×2): 15 mL via OROMUCOSAL

## 2019-03-31 NOTE — Progress Notes (Signed)
PHARMACY - PHYSICIAN COMMUNICATION CRITICAL VALUE ALERT - BLOOD CULTURE IDENTIFICATION (BCID)  Desiree Mccall is an 68 y.o. female who presented to [facility] on 03/30/2019 with a chief complaint of shortness of breath.  Assessment:  1/4 anaerobic bottles GPC, source unknown  Name of physician (or Provider) Contacted: Desiree Mccall  Current antibiotics: Cefepime,Doxycycline  Changes to prescribed antibiotics recommended:  Likely contaminant but NP would like to continue both antibiotics as patient has COVID pneumonia and still needs antibiotic therapy.  No results found for this or any previous visit.  Desiree Mccall 03/31/2019  9:37 PM

## 2019-03-31 NOTE — Progress Notes (Addendum)
ANTICOAGULATION CONSULT NOTE - Initial Consult  Pharmacy Consult for Heparin Drip Indication: NSTEMI  [name]  Patient Measurements: Height: 5\' 4"  (162.6 cm) Weight: 231 lb 4.2 oz (104.9 kg) [name] (Calculated) : 54.7 Heparin Dosing Weight:    Vital Signs: Temp: 97.7 F (36.5 C) (11/29 0800) Temp Source: Other (Comment) (11/29 0800) BP: 92/59 (11/29 0800) Pulse Rate: 55 (11/29 0800)  Labs: Recent Labs    03/30/19 2124 03/31/19 0117 03/31/19 0556  HGB 11.0*  --  9.1*  HCT 35.4*  --  29.1*  PLT 291  --  201  APTT 28  --   --   [name] 21.4*  --   --   [name] 1.9*  --   --   CREATININE 1.22*  --  0.77  TROPONINIHS 42* 76*  --     Estimated Creatinine Clearance: 81.7 mL/min (by C-G formula based on SCr of 0.77 mg/dL).   Medical History: Past Medical History:  Diagnosis Date  . Aortic dissection ([place]) 2010  . CHF (congestive heart failure) ([place])   . Diabetes mellitus without complication ([place])   . [name]   . Heart attack ([place]) sept. 25, 2010   Assessment: Patient is a 68yo female admitted with Covid pneumonia. Pharmacy consulted for Heparin dosing for NSTEMI. Patient was taking Xarelto prior to admission, unclear when last dose was given. Baseline HL is elevated, will need to use aPTT to guide therapy.  Goal of Therapy:  APTT: 66-102 seconds Heparin level 0.3-0.7 units/ml Monitor platelets by anticoagulation protocol: Yes   Plan:  Will start Heparin 1000 units/hr IV without a bolus. Discontinue Heparin 5000 units SQ q8h. Will check a aPTT in 6 hours, Heparin level with AM labs. Daily CBC while on Heparin drip.  Paulina Fusi, PharmD, BCPS 03/31/2019 9:05 AM

## 2019-03-31 NOTE — ED Notes (Signed)
130.8 mg ketamine wasted in sink on arrival to ccu with beth burono, rn. Ketamine drip stopped per ccu staff.

## 2019-03-31 NOTE — Progress Notes (Signed)
eLink Physician-Brief Progress Note Patient Name: Desiree Mccall DOB: Jun 12, 1950 MRN: [id number]   Date of Service  03/31/2019  HPI/Events of Note  55 F with hx of Aortic disection s/p TEVAR, P a fib on xarelto, PEA arrest 06/2018, CAD, low EF CHF, AICD, CVA, DM with recent admission for AHRF  in early November-covid neg then now readmitted for [facility] from Multifocal Covid PNA/AKI.  elevated d dimer, LA, BNP and troponin.  on Vanc/cefpime/metronidazole/doxy. sedation on Ventilator.   Camera: VS stable. in synchrony with vent. 500/12/16/58%. ketamine/levo/propofol.   Data: reviewed.troponin up to 76 from 49. EKG sinus, prologed qtc, T down infero laterally, non specific changes. demand ischemia likly. LA 2.2.latest ABG 7.32/49/136. P?F > 200.  EF 25% 10/30. hypokinesia.   - continue care - keep MAP > 65 - follow LA - trend troponin   eICU Interventions  - on VTE prophylaxis. - BG goals < 180 - vent bundle. - lung protective ventilation to consider.  - on dexa/remdesivir. Follow markers LFT, Urine out put.      Intervention Category Major Interventions: Respiratory failure - evaluation and management;Arrhythmia - evaluation and management Evaluation Type: New Patient Evaluation  Elmer Sow 03/31/2019, 4:10 AM

## 2019-03-31 NOTE — Progress Notes (Signed)
Initial Nutrition Assessment  DOCUMENTATION CODES:   Obesity unspecified  INTERVENTION:  Initiate Vital High Protein at 15 mL/hr and advance by 20 mL/hr every 8 hours to goal rate of 55 mL/hr (1320 mL goal daily volume). Provides 1320 kcal, 116 grams of protein, 1109 mL H2O daily. With current propofol and D5-1/2NS rates provides a total of 2094 kcal daily.  Provide minimum free water flush of 20-30 mL Q4hrs to maintain tube patency.  Provide liquid MVI daily per tube.  NUTRITION DIAGNOSIS:   Increased nutrient needs related to catabolic YJEHUDJ(SHFWY-63 infection) as evidenced by estimated needs.  GOAL:   Provide needs based on ASPEN/SCCM guidelines  MONITOR:   Vent status, Labs, Weight trends, TF tolerance, I & O's  REASON FOR ASSESSMENT:   Ventilator, Consult (assessment and recommendations for tube feeding)  ASSESSMENT:   68 year old female with PMHx of DM, CHF, hx MI, hx aortic dissection s/p TEVAR, PAF, hx PEA arrest 06/2018, CAD, s/p AICD, hx CVA, HTN, HLD, OSA  Admitted with COVID-19 infection requiring intubation on 11/28, AKI.   Patient with active COVID infection on airborne/contact precautions. Patient is intubated and sedated. On PCV with FiO2 40%, PEEP 8 cmH2O. Abdomen soft per RN documentation. Last BM unknown. Discussed with MD. Plan is to initiate tube feeds today.  Enteral Access: 16 Fr. OGT placed 11/28; terminates in stomach per chest x-ray 11/18; cm marking not documented at this time  MAP: 63-86 mmHg  Patient is currently intubated on ventilator support Ve: 10.9 L/min Temp (24hrs), Avg:98.3 F (36.8 C), Min:97.2 F (36.2 C), Max:102.5 F (39.2 C)  Propofol: 21.6 mL/hr (570 kcal daily)  Medications reviewed and include: Decadron 6 mg Q24hrs IV, Novolog 0-9 units Q4hrs, Lantus, thiamine 100 mg daily per tube, zinc sulfate 220 mg daily per tube, cefepime, D5-1/2NS at 50 mL/hr (60 grams dextrose; 204 kcal daily), doxycycline, famotidine, fentanyl gtt,  norepinephrine gtt at 2 mcg/min, propofol gtt.  Labs reviewed: CBG 120-157.  I/O: 680 mL UOP yesterday  NUTRITION - FOCUSED PHYSICAL EXAM:  Unable to complete at this time.  Diet Order:   Diet Order            Diet NPO time specified  Diet effective now             EDUCATION NEEDS:   No education needs have been identified at this time  Skin:  Skin Assessment: Reviewed RN Assessment  Last BM:  Unknown  Height:   Ht Readings from Last 1 Encounters:  03/30/19 5\' 4"  (1.626 m)   Weight:   Wt Readings from Last 1 Encounters:  03/31/19 104.9 kg   Ideal Body Weight:  54.5 kg  BMI:  Body mass index is 39.7 kg/m.  Estimated Nutritional Needs:   Kcal:  2065 (PSU 2010 w/ MSJ 1578, Ve 10.9, Tmax 39.2)  Protein:  105-135 grams  Fluid:  1.6 L/day  Jacklynn Barnacle, MS, RD, LDN Office: [phone] Pager: [phone] After Hours/Weekend Pager: [phone]

## 2019-03-31 NOTE — Progress Notes (Signed)
Assisted tele visit to patient with family member.  Dartanion Teo M, RN  

## 2019-03-31 NOTE — Progress Notes (Signed)
Pharmacy Antibiotic Note  Desiree Mccall is a 68 y.o. female admitted on 03/30/2019 with pneumonia.  Pharmacy has been consulted for Cefepime dosing. Patient is Covid + on Remdesivir.  Plan: Cefepime 2g IV q8h  For Crcl 81.7 ml/min  Height: 5\' 4"  (162.6 cm) Weight: 231 lb 4.2 oz (104.9 kg) [name] (Calculated) : 54.7  Temp (24hrs), Avg:98.3 F (36.8 C), Min:97.2 F (36.2 C), Max:102.5 F (39.2 C)  Recent Labs  Lab 03/30/19 2124 03/30/19 2134 03/30/19 2317 03/31/19 0556 03/31/19 0825  WBC 11.3*  --   --  11.0*  --   CREATININE 1.22*  --   --  0.77  --   LATICACIDVEN  --  5.7* 2.2*  --  0.8    Estimated Creatinine Clearance: 81.7 mL/min (by C-G formula based on SCr of 0.77 mg/dL).    [name]  Antimicrobials this admission: Cefepime 11/28 >>  Metronidazole 11/28 >> 11/28 Doxycycline 11/29 >>  Remdesivir 11/29 >>   Dose adjustments this admission:   Microbiology results:  Thank you for allowing pharmacy to be a part of this patient's care.  Chinita Greenland PharmD Clinical Pharmacist 03/31/2019

## 2019-03-31 NOTE — Progress Notes (Signed)
Wasted 130.8 mg of ketamine with April Brumgard RN.

## 2019-03-31 NOTE — H&P (Signed)
History and Physical    Desiree Mccall ZOX:[id number] DOB: 03/20/1951 DOA: 03/30/2019  PCP: Abram SanderAdamo, Elena M, MD  Patient coming from: Home  I have personally briefly [facility]  Chief Complaint: Acute shortness of breath   HPI: Desiree Mccall is a 68 y.o. female with medical history significant of  aortic dissection (s/p TEVAR), PAF (on xarelto), PEA arrest (feb 2020) CAD, Ishcemic CHF EF 25% ,S/p AICD, CVA, HTN, HLD, OSA on CPaP, interim history of admission one mo ago with acute respiratory failure due to rhinovirus requiring bipap and icu admission for which she was discharged on11/1. Of note patient two days later presented to [facility] with sob complaints diagnosed with post viral bronchitis /COPD exacerbation. Patient was discharged on  steroid taper. Patient now presents to ED one month later in acute respiratory distress brought in by EMS. Per ems patient called 911 with complaint of difficulty breathing.  On arrival to patients residence patient was tachypneic, pale and diaphoretic. She was noted to be in acute respiratory distress, they were unable to get a pulse ox at that time. Patient was placed on cpap and transported to ed.    ED Course: IN ed patient noted to be in acute hypoxic respiratory failure requiring emergent intubation.  Patient was intubated with improvement of saturations.  Patient s/p intubation on vent had sat of 100% on 70% fio2 on VC.  Vitals:  Temp 100.2, hr102, bp:114/89,  Sat 95% s/p (vitals s/p intubation)  Labs:+ SARS COV2 Ag,LDH353, ferritin 39, procalcitonin  Negative, lactate 2.2, D-dimer 5178. Abg s/p intubation: ac/ TV500/peep5/ Fio2:70%/ Ph:7.32/pco249/po2 136 Patient now admitted to  To ICU , however no beds available, patient will be on hold in ed with critical care provided by ED RN as per protocol  Case discussed with E-Link , Dr Jayme CloudGonzalez , and ICU NP as well as ED MD Critical care consult has also been  placed.   Review of Systems: unable to obtain /patient sedated and intubated   Past Medical History:  Diagnosis Date  . Aortic dissection ([facility]) 2010  . CHF (congestive heart failure) ([facility])   . Diabetes mellitus without complication ([facility])   . [name]   . Heart attack ([facility]) sept. 25, 2010    Past Surgical History:  Procedure Laterality Date  . ABDOMINAL HYSTERECTOMY    . CARDIAC SURGERY    . CAROTID STENT  2010  . CHOLECYSTECTOMY    . LEFT HEART CATH AND CORONARY ANGIOGRAPHY N/A 06/05/2018   Procedure: LEFT HEART CATH AND CORONARY ANGIOGRAPHY;  Surgeon: Laurier NancyKhan, Shaukat A, MD;  Location: [facility] INVASIVE CV LAB;  Service: Cardiovascular;  Laterality: N/A;  . STENT PLACE LEFT URETER ([facility] HX)       reports that she has quit smoking. Her smoking use included cigarettes. She started smoking about 8 years ago. She smoked 0.50 packs per day. She has never used smokeless tobacco. She reports current alcohol use. She reports that she does not use drugs.( as per ed chart)  [name]  Family History  Problem Relation Age of Onset  . Heart failure Mother   . Heart failure Father     Prior to Admission medications   Medication Sig Start Date End Date Taking? Authorizing Provider  acetaminophen (TYLENOL) 325 MG tablet Take 2 tablets (650 mg total) by mouth every 4 (four) hours as needed for mild pain (temp > 101.5). 06/05/18   Judithe ModestKeene, Jeremiah D,  NP  albuterol (PROVENTIL) (2.5 MG/3ML) 0.083% nebulizer solution Take 3 mLs (2.5 mg total) by nebulization every 2 (two) hours as needed for wheezing or shortness of breath. 06/05/18   Harlon Ditty D, NP  aspirin EC 81 MG tablet Take 81 mg by mouth daily. 10/29/18 10/29/19  [provider]  atorvastatin (LIPITOR) 40 MG tablet Take 1 tablet (40 mg total) by mouth daily at 6 PM. 06/06/18   Judithe Modest, NP  benzonatate (TESSALON PERLES) 100 MG capsule Take 1 capsule (100 mg total) by mouth every 6 (six) hours as needed for  cough. 03/03/19 03/02/20  Spongberg, Susy Frizzle, MD  bisacodyl (DULCOLAX) 10 MG suppository Place 1 suppository (10 mg total) rectally daily as needed for moderate constipation. 03/03/19   Spongberg, Susy Frizzle, MD  budesonide (PULMICORT) 0.5 MG/2ML nebulizer solution Take 2 mLs (0.5 mg total) by nebulization 2 (two) times daily. 03/03/19   Spongberg, Susy Frizzle, MD  carvedilol (COREG) 6.25 MG tablet Take 1 tablet (6.25 mg total) by mouth 2 (two) times daily with a meal. Patient not taking: Reported on 02/27/2019 06/06/18   Judithe Modest, NP  clopidogrel (PLAVIX) 75 MG tablet Take 1 tablet (75 mg total) by mouth every morning. 06/06/18   Judithe Modest, NP  docusate sodium (COLACE) 100 MG capsule Take 1 capsule (100 mg total) by mouth daily. 06/06/18   Judithe Modest, NP  ENSURE MAX PROTEIN (ENSURE MAX PROTEIN) LIQD Take 330 mLs (11 oz total) by mouth 2 (two) times daily. 06/05/18   Judithe Modest, NP  famotidine (PEPCID) 20 MG tablet Take 1 tablet (20 mg total) by mouth 2 (two) times daily. 06/05/18   Judithe Modest, NP  FLUoxetine (PROZAC) 20 MG capsule Take 2 capsules by mouth daily.  03/12/15   [provider]  fluticasone (FLONASE) 50 MCG/ACT nasal spray Place 1 spray into the nose daily. 01/04/18   [provider]  furosemide (LASIX) 20 MG tablet Take 20 mg by mouth daily. 12/06/18   [provider]  Insulin Detemir (LEVEMIR FLEXTOUCH) 100 UNIT/ML Pen Inject 30 Units into the skin daily. 10/27/18   [provider]  ipratropium-albuterol (DUONEB) 0.5-2.5 (3) MG/3ML SOLN Take 3 mLs by nebulization every 4 (four) hours as needed for wheezing or shortness of breath. 03/05/19   [provider]  losartan (COZAAR) 50 MG tablet Take 100 mg by mouth daily. 01/02/19   [provider]  metFORMIN (GLUCOPHAGE) 1000 MG tablet Take 1,000 mg by mouth 2 (two) times daily. 02/26/19   [provider]  metoprolol succinate (TOPROL-XL) 100 MG 24 hr  tablet Take 100 mg by mouth daily. 10/22/18   [provider]  montelukast (SINGULAIR) 10 MG tablet Take 10 mg by mouth at bedtime. 12/11/18   [provider]  Multiple Vitamin (MULTIVITAMIN WITH MINERALS) TABS tablet Take 1 tablet by mouth daily. 06/06/18   Judithe Modest, NP  pantoprazole (PROTONIX) 40 MG tablet Take 1 tablet (40 mg total) by mouth at bedtime. 06/05/18   Judithe Modest, NP  potassium chloride SA (K-DUR,KLOR-CON) 20 MEQ tablet Take 1 tablet (20 mEq total) by mouth every 2 (two) hours. Patient not taking: Reported on 02/27/2019 06/05/18   Judithe Modest, NP  rivaroxaban (XARELTO) 20 MG TABS tablet Take 20 mg by mouth daily.    [provider]  senna-docusate (SENOKOT-S) 8.6-50 MG tablet Take 1 tablet by mouth 2 (two) times daily as needed for mild constipation. 06/05/18  Judithe Modest, NP  spironolactone (ALDACTONE) 25 MG tablet Take 1 tablet (25 mg total) by mouth daily. Patient not taking: Reported on 02/27/2019 06/06/18   Judithe Modest, NP    Physical Exam: Vitals:   03/30/19 2330 03/30/19 2345 03/31/19 0000 03/31/19 0015  BP: (!) 82/62 (!) 111/59 106/61 (!) 109/58  Pulse: 75 73 72 73  Resp: Temp: 98.4 F (36.9 C) 97.9 F (36.6 C) 97.8 F (36.6 C) 97.7 F (36.5 C)  TempSrc:      SpO2: 99% 100% 100% 100%  Weight:      Height:        Constitutional: NAD, calm, comfortable Vitals:   03/30/19 2330 03/30/19 2345 03/31/19 0000 03/31/19 0015  BP: (!) 82/62 (!) 111/59 106/61 (!) 109/58  Pulse: 75 73 72 73  Resp: Temp: 98.4 F (36.9 C) 97.9 F (36.6 C) 97.8 F (36.6 C) 97.7 F (36.5 C)  TempSrc:      SpO2: 99% 100% 100% 100%  Weight:      Height:       Eyes: PERRL, lids and conjunctivae normal ENMT: deferred patient intubated  Neck: normal, supple, no masses, no thyromegaly Respiratory: coarse bs bilaterally, no wheezing, no crackles. Mild abdominal breathing noted  Cardiovascular: Regular rate and  rhythm, no murmurs / rubs / gallops. No extremity edema. 2+ pedal pulses. No carotid bruits.  Abdomen: no tenderness,obese, no masses palpated. No hepatosplenomegaly. Bowel sounds positive.  Musculoskeletal: no clubbing / cyanosis. No joint deformity upper and lower extremities. Good ROM, no contractures. Normal muscle tone.  Skin: no rashes, healing abrasions on lower extremities , no ulcers. No induration Neurologicno facial droop, sedated, pupils reactive and equal,  withdraws with noxious stimuli iPsychiatric: unable to assess   Labs on Admission: I have personally reviewed following labs and imaging studies  CBC: Recent Labs  Lab 03/30/19 2124  WBC 11.3*  NEUTROABS 7.2  HGB 11.0*  HCT 35.4*  MCV 81.8  PLT 291   Basic Metabolic Panel: Recent Labs  Lab 03/30/19 2124  NA 137  K 4.5  CL 103  CO2 19*  GLUCOSE 377*  BUN 19  CREATININE 1.22*  CALCIUM 8.8*   GFR: Estimated Creatinine Clearance: 53.3 mL/min (A) (by C-G formula based on SCr of 1.22 mg/dL (H)). Liver Function Tests: Recent Labs  Lab 03/30/19 2124  AST 112*  ALT 56*  ALKPHOS 203*  BILITOT 1.0  PROT 7.4  ALBUMIN 3.9   No results for input(s): LIPASE, AMYLASE in the last 168 hours. No results for input(s): AMMONIA in the last 168 hours. Coagulation Profile: Recent Labs  Lab 03/30/19 2124  [name] 1.9*   Cardiac Enzymes: No results for input(s): CKTOTAL, CKMB, CKMBINDEX, TROPONINI in the last 168 hours. BNP (last 3 results) No results for input(s): PROBNP in the last 8760 hours. HbA1C: No results for input(s): HGBA1C in the last 72 hours. CBG: Recent Labs  Lab 03/30/19 2335  GLUCAP 120*   Lipid Profile: Recent Labs    03/30/19 2317  TRIG 75   Thyroid Function Tests: No results for input(s): TSH, T4TOTAL, FREET4, T3FREE, THYROIDAB in the last 72 hours. Anemia Panel: Recent Labs    03/30/19 2139  FERRITIN 39   Urine analysis:    Component Value Date/Time   COLORURINE YELLOW (A)  02/28/2019 2157   APPEARANCEUR HAZY (A) 02/28/2019 2157   LABSPEC 1.009 02/28/2019 2157   PHURINE 5.0 02/28/2019 2157  GLUCOSEU NEGATIVE 02/28/2019 2157   HGBUR NEGATIVE 02/28/2019 2157   BILIRUBINUR NEGATIVE 02/28/2019 2157   KETONESUR NEGATIVE 02/28/2019 2157   PROTEINUR 100 (A) 02/28/2019 2157   UROBILINOGEN 0.2 09/19/2009 1113   NITRITE NEGATIVE 02/28/2019 2157   LEUKOCYTESUR NEGATIVE 02/28/2019 2157    Radiological Exams on Admission: Dg Chest Portable 1 View  Result Date: 03/30/2019 CLINICAL DATA:  Shortness of breath, respiratory failure EXAM: PORTABLE CHEST 1 VIEW COMPARISON:  02/28/2019 FINDINGS: Left AICD remains in place, unchanged. The endotracheal tube is 6 cm above the carina. NG tube enters the stomach. Prior aortic stent graft. Airspace disease bilaterally, most pronounced in the upper lobes and perihilar regions. Given the upper lobe predominance, favor infection over edema. No visible effusions. No acute bony abnormality. IMPRESSION: Cardiomegaly, vascular congestion. Bilateral upper lobe and perihilar opacities concerning for pneumonia. Electronically Signed   By: Charlett Nose M.D.   On: 03/30/2019 21:39    EKG: Independently reviewed. Sinus,lvh, poor quality ekg  Repeat pending   Assessment/Plan Active Problems:   Sepsis with acute hypoxic respiratory failure without septic shock [facility]) Patient is a 68 year old female with past medical history significant for aortic dissection (s/p TEVAR), PAF (on xarelto), PEA arrest (feb 2020) CAD, Ishcemic CMY EF 25% ,S/p AICD, CVA, HTN, HLD, OSA on CPaP, interim history of admission one month ago with acute respiratory failure due to rhinovirus requiring bipap and icu admission for which she was discharged on11/1.  Patient now returns to ED in acute respiratory distress on evaluation patient is found to have acute hypoxic respiratory failure due to viral pneumonia caused by the coronavirus.  VDRF due to severe COVID 19 infection  /viral pneumonia  -admit to ICU  -critical care on board  - start iv decadron /remdesivir per protocol  -consider id consult to assist with further therapies -monitor covid labs q 48hours ( lfts elevated (ast112/alt/56/alphos203/ ,lipids stable,  D-dimer elevated)  -vent per icu protocol/ vap bundles in place  AKI   -due to sepsis /infection  -ivfs as patient can tolerate  - monitor labs closely  -consider  Renal consult ( cr 0.48 up to 1.22)  Episode of transient hypotension: -Hold all antihypertensive medications due to transient hypotension related to sedation/sepsis  -responsive to bolus in ed -Resume once blood pressure can tolerate -ivf bolus now and prn, + maintenance -maintain map 65 and above   Sepsis due to COVID 19 infection  -place on sepsis protocol  - follow lactate  -ivfs as patient can tolerate with ef of 25%   Diabetes type 2 insulin-dependent -fs-377 -resume lantus  - npo/is/fs scale  - last a1c 6.8 (02/27/19)   Hx of CAD s/p MI / PAD  -continue plavix ,asa -resume bb as patient able to tolerate -cycle ce , ensure no acute cardiac dysfunction with patients know history   HX of PAF -currently in sinus  -on full dose anticoag  -for now continue xarelto  - consider switching to lwmh/heparin full dose while vented in icu  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%  -no signs of overt heart failure  -bnp elevated from base to 1232 ( due to hypoxia/demand) -monitor patient closely for signs of overload  - strict I/O / resume diuretics as blood pressure tolerates  CVA  -Continue secondary prophylaxis   HLD -Continue statin  OSA  -on cpap on at home  Hx of Mild COPD  -inhalers per COVID protocol  FEN Electrolytes stable replete prn  ivfs as noted above  Nutrition:  nutrition consult placed   DVT prophylaxis: xarelto   (Code Status:FULL  Family Communication:  NA crtical care has taken over care  Disposition Plan:ICU  Consults called:Critical Care Dr  Lubertha Basque  Admission status:inpatient   Clance Boll MD [facility] Pager [phone] If 7PM-7AM, please contact night-coverage [email or web address] Password [facility]  03/31/2019, 12:38 AM

## 2019-03-31 NOTE — Progress Notes (Addendum)
PULMONARY / CRITICAL CARE MEDICINE  Name: Desiree Mccall MRN: [id number] DOB: 1950/12/26    LOS: 1   Reason for Admission: Acute hypoxic respiratory failure  Brief patient description: 68 year old female with female with an extensive medical history, admitted with acute hypoxic respiratory failure requiring emergent intubation and COVID-19 pneumonia  HPI: This is a 68 year old Caucasian female with a significant cardiac history that includes CAD (2010 Anter STEMI and LAD PCI, 06/2018 70% pRCA stenosis), PEA Arrest 05/2018, ICM (EF 43% TEE 10/2018, ICD 2011), PAF (dx 06/2018), hx aortic dissection (2010 Endovascular repair w/ L. 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  History is obtained from EMS and ED records as patient is currently intubated and sedated.  Per EMS, patient was severely tachypneic, diaphoretic, pale with SPO2 that was unreadable on room air.  She was placed on CPAP at 100% FiO2.  However upon arrival in the ED, patient was still pale, diaphoretic and severely tachypneic and hypoxic hence she was emergently intubated.  Her ED work-up was significant for hyperglycemia with a blood glucose of 377 mg/dL, elevated LFTs, lactic acid of 5.7, WBC of 11.3K, hemoglobin of 11.0, hematocrit of 35.4, fibrinogen 499, D-dimer 5178, creatinine of 1.22, GFR of 46, and the [name] 1.9.  Her EKG was unremarkable except for a QTC of 520.  COVID-19 antigen test was positive.  She is being admitted to the ICU for further management.   Past Medical History:  Diagnosis Date  . Aortic dissection (HCC) 2010  . CHF (congestive heart failure) (HCC)   . Diabetes mellitus without complication (HCC)   . Gall  bladder disease   . Heart attack (HCC) sept. 25, 2010   Past Surgical History:  Procedure Laterality Date  . ABDOMINAL HYSTERECTOMY    . CARDIAC SURGERY    . CAROTID STENT  2010  . CHOLECYSTECTOMY    . LEFT HEART CATH AND CORONARY ANGIOGRAPHY N/A 06/05/2018   Procedure: LEFT HEART CATH AND CORONARY ANGIOGRAPHY;  Surgeon: Laurier Nancy, MD;  Location: [facility] INVASIVE CV LAB;  Service: Cardiovascular;  Laterality: N/A;  . STENT PLACE LEFT URETER ([facility] HX)     No current facility-administered medications on file prior to encounter.    Current Outpatient Medications on File Prior to Encounter  Medication Sig  . acetaminophen (TYLENOL) 325 MG tablet Take 2 tablets (650 mg total) by mouth every 4 (four) hours as needed for mild pain (temp > 101.5).  Marland Kitchen albuterol (PROVENTIL) (2.5 MG/3ML) 0.083% nebulizer solution Take 3 mLs (2.5 mg total) by nebulization every 2 (two) hours as needed for wheezing or shortness of breath.  Marland Kitchen aspirin EC 81 MG tablet Take 81 mg by mouth daily.  Marland Kitchen atorvastatin (LIPITOR) 40 MG tablet Take 1 tablet (40 mg total) by mouth daily at 6 PM.  . benzonatate (TESSALON PERLES) 100 MG capsule Take 1 capsule (100 mg total) by mouth every 6 (six) hours as needed for cough.  . bisacodyl (DULCOLAX) 10 MG suppository Place 1 suppository (10 mg total) rectally daily as needed for moderate constipation.  . budesonide (PULMICORT) 0.5 MG/2ML nebulizer solution Take 2 mLs (0.5 mg total) by nebulization 2 (two) times daily.  . carvedilol (COREG) 6.25 MG tablet Take 1 tablet (6.25 mg total) by  mouth 2 (two) times daily with a meal. (Patient not taking: Reported on 02/27/2019)  . clopidogrel (PLAVIX) 75 MG tablet Take 1 tablet (75 mg total) by mouth every morning.  . docusate sodium (COLACE) 100 MG capsule Take 1 capsule (100 mg total) by mouth daily.  Marland Kitchen ENSURE MAX PROTEIN (ENSURE MAX PROTEIN) LIQD Take 330 mLs (11 oz total) by mouth 2 (two) times daily.  . famotidine (PEPCID) 20 MG tablet Take  1 tablet (20 mg total) by mouth 2 (two) times daily.  Marland Kitchen FLUoxetine (PROZAC) 20 MG capsule Take 2 capsules by mouth daily.   . fluticasone (FLONASE) 50 MCG/ACT nasal spray Place 1 spray into the nose daily.  . furosemide (LASIX) 20 MG tablet Take 20 mg by mouth daily.  . Insulin Detemir (LEVEMIR FLEXTOUCH) 100 UNIT/ML Pen Inject 30 Units into the skin daily.  Marland Kitchen ipratropium-albuterol (DUONEB) 0.5-2.5 (3) MG/3ML SOLN Take 3 mLs by nebulization every 4 (four) hours as needed for wheezing or shortness of breath.  . losartan (COZAAR) 50 MG tablet Take 100 mg by mouth daily.  . metFORMIN (GLUCOPHAGE) 1000 MG tablet Take 1,000 mg by mouth 2 (two) times daily.  . metoprolol succinate (TOPROL-XL) 100 MG 24 hr tablet Take 100 mg by mouth daily.  . montelukast (SINGULAIR) 10 MG tablet Take 10 mg by mouth at bedtime.  . Multiple Vitamin (MULTIVITAMIN WITH MINERALS) TABS tablet Take 1 tablet by mouth daily.  . pantoprazole (PROTONIX) 40 MG tablet Take 1 tablet (40 mg total) by mouth at bedtime.  . potassium chloride SA (K-DUR,KLOR-CON) 20 MEQ tablet Take 1 tablet (20 mEq total) by mouth every 2 (two) hours. (Patient not taking: Reported on 02/27/2019)  . rivaroxaban (XARELTO) 20 MG TABS tablet Take 20 mg by mouth daily.  Marland Kitchen senna-docusate (SENOKOT-S) 8.6-50 MG tablet Take 1 tablet by mouth 2 (two) times daily as needed for mild constipation.  Marland Kitchen spironolactone (ALDACTONE) 25 MG tablet Take 1 tablet (25 mg total) by mouth daily. (Patient not taking: Reported on 02/27/2019)    Allergies [name]  Family History Family History  Problem Relation Age of Onset  . Heart failure Mother   . Heart failure Father    Social History  reports that she has quit smoking. Her smoking use included cigarettes. She started smoking about 8 years ago. She smoked 0.50 packs per day. She has never used smokeless tobacco. She reports current alcohol use. She reports that she does not use drugs.*Per my office  records:The patient has smoked half to 1 pack/day since age 69.  She had a few years of a hiatus but for the most part had been consistently smoking until very recently.   Review Of Systems: Unable to obtain as patient is intubated and sedated  VITAL SIGNS: BP 94/60   Pulse (!) 56   Temp (!) 97.5 F (36.4 C)   Resp 11   Ht  (1.626 m)   Wt 104.9 kg   SpO2 100%   BMI 39.70 kg/m   HEMODYNAMICS:    VENTILATOR SETTINGS: Vent Mode: PRVC FiO2 (%):  [60 %-70 %] 60 % Set Rate:  [16 bmp] 16 bmp Vt Set:  [500 mL] 500 mL PEEP:  [5 cmH20] 5 cmH20 Plateau Pressure:  [24 cmH20] 24 cmH20  INTAKE / OUTPUT: I/O last 3 completed shifts: In: 1829.1 [I.V.:395.1; IV Piggyback:1434] Out: 680 [Urine:680]  PHYSICAL EXAMINATION: Exam was limited due to required PPE in the setting of COVID-19 pandemic General: Critically ill-appearing obese  woman, intubated, mechanically ventilated, sedated.  She is pale. HEENT: Orotracheally intubated, OG in place Neuro: Sedated, cannot assess any further. Cardiovascular: Bradycardic, regular rate, cannot appreciate murmurs due to lung sounds Lungs: Rales and rhonchi throughout, no wheezes. Abdomen: Obese, soft, normoactive bowel sounds  Musculoskeletal: No joint effusions or deformity, no cyanosis or clubbing. Skin: No petechia or rashes, warm and dry.  LABS:  BMET Recent Labs  Lab 03/30/19 2124 03/31/19 0556  NA 137 140  K 4.5 4.1  CL 103 109  CO2 19* 22  BUN 19 21  CREATININE 1.22* 0.77  GLUCOSE 377* 173*    Electrolytes Recent Labs  Lab 03/30/19 2124 03/31/19 0556  CALCIUM 8.8* 8.1*    CBC Recent Labs  Lab 03/30/19 2124 03/31/19 0556  WBC 11.3* 11.0*  HGB 11.0* 9.1*  HCT 35.4* 29.1*  PLT 291 201    Coag's Recent Labs  Lab 03/30/19 2124  APTT 28  [name] 1.9*    Sepsis Markers Recent Labs  Lab 03/30/19 2134 03/30/19 2139 03/30/19 2317 03/31/19 0556  LATICACIDVEN 5.7*  --  2.2*  --   PROCALCITON  --  <0.10  --   7.21    ABG Recent Labs  Lab 03/30/19 2128 03/30/19 2246  PHART 7.27* 7.32*  PCO2ART 45 49*  PO2ART 72* 136*    Liver Enzymes Recent Labs  Lab 03/30/19 2124  AST 112*  ALT 56*  ALKPHOS 203*  BILITOT 1.0  ALBUMIN 3.9    Cardiac Enzymes No results for input(s): TROPONINI, PROBNP in the last 168 hours.  Glucose Recent Labs  Lab 03/30/19 2335  GLUCAP 120*    Imaging Dg Chest Portable 1 View  Result Date: 03/30/2019 CLINICAL DATA:  Shortness of breath, respiratory failure EXAM: PORTABLE CHEST 1 VIEW COMPARISON:  02/28/2019 FINDINGS: Left AICD remains in place, unchanged. The endotracheal tube is 6 cm above the carina. NG tube enters the stomach. Prior aortic stent graft. Airspace disease bilaterally, most pronounced in the upper lobes and perihilar regions. Given the upper lobe predominance, favor infection over edema. No visible effusions. No acute bony abnormality. IMPRESSION: Cardiomegaly, vascular congestion. Bilateral upper lobe and perihilar opacities concerning for pneumonia. Electronically Signed   By: Rolm Baptise M.D.   On: 03/30/2019 21:39    STUDIES:  None  CULTURES: Blood cultures x2  ANTIBIOTICS: Doxycycline 03/31/2019> Cefepime 03/30/2019>  LINES/TUBES: Right femoral triple-lumen catheter (ED) Peripheral IVs Foley catheter, necessary due to need for accurate I&O in the setting of critical illness.  DISCUSSION: 68 year old female presenting with acute hypoxic and hypercarbic respiratory failure secondary to COVID-19 pneumonia, septic shock, hyperglycemia, elevated troponin, and AKI requiring emergent intubation  ASSESSMENT / PLAN:  PULMONARY A: Acute hypoxic and hypercarbic respiratory failure COVID-19 pneumonia Lactic acidosis P:   Full vent support switched to pressure control ventilation due to asynchrony ABG and chest x-ray post intubation reviewed, ET tube adjusted ABG and chest x-ray as needed Nebulized bronchodilators, every 4  hours VAP protocol Weaning trials as tolerated COVID-19 protocol Transfer to [facility] once a bed is available, [employer] staff notified, currently no beds therefore will continue to care for the patient here.  She is receiving appropriate care with all necessary medications. Trend lactic acid level  CARDIOVASCULAR A:  Shock likely due to sepsis in sedation for intubation-blood pressure improved Element of cardiogenic shock cannot be excluded Elevated troponin s/p TEVAR Paroxysmal atrial fibrillation on Xarelto Ischemic cardiomyopathy s/p AICD placement Hypertension Hyperlipidemia Combined systolic and diastolic heart failure  with reduced EF of 25% Bradycardia, suspect medication effect (propofol) P:  Hemodynamics per ICU protocol Avoid aggressive IV fluid resuscitation given patient's extensive cardiac history with low EF Pressors as needed to maintain mean arterial blood pressure 65 and above Hold Xarelto and start IV heparin  Cycle cardiac enzymes Resume home cardiac medications once blood pressure improves Diuresis as tolerated  RENAL A:   Acute kidney injury likely ATN Cannot exclude early cardiorenal syndrome P:   Trend renal indices Monitor and correct electrolytes Avoid nephrotoxins Currently no need for nephrology consultation  GASTROINTESTINAL A:   Elevated LFTs P:   Trend LFTs  HEMATOLOGIC A:   Coagulopathy secondary to COVID-19 infection and atrial fibrillation on chronic anticoagulation-D-dimer elevated as well as [name] of 1.9 P:  Monitor coagulation profile Start heparin infusion without a bolus and hold Xarelto  INFECTIOUS A:   COVID-19 infection Sepsis  P:   Procalcitonin trending up-less than 0.1 to 7.21 this morning; continue to trend and adjust antibiotics appropriately May have secondary bacterial infection Pancultured: Sputum, blood, urine On cefepime and doxycycline Macrolides not utilized due to increased  QT  ENDOCRINE A:   Type 2 diabetes mellitus with hyperglycemia P:   Blood glucose monitoring with sliding scale insulin coverage  NEUROLOGIC A:   Acute encephalopathy secondary to hypoxemia, hypercapnia and COVID-19 infection History of CVA P:   RASS goal: 0 to -1 Fentanyl, Versed infusion (not tolerating propofol due to bradycardia hypotension) for vent sedation and discomfort Discontinue ketamine and propofol  continue to monitor mental status and wean down sedation as tolerated  Best Practice: Code Status: Full code Diet: NPO for now, initiate tube feeds as soon as feasible GI prophylaxis: Pepcid VTE prophylaxis: SCDs and already on heparin gtt  FAMILY  Updates: I have spoken to daughter, Lenord FellersMarissa Kernodle will update with any changes in treatment plan and patient condition.Ashok CordiaMarissa will also contact her sister Williemae NatterShelby Kernodle.  We will discuss goals of care as necessary.  Critical care time 75 minutes.  We will assume care while the patient is in ICU.  Gailen Shelter. Laura Terrel Nesheiwat, MD [facility]  03/31/2019, 8:11 AM    *This note was dictated using voice recognition software/Dragon.  Despite best efforts to proofread, errors can occur which can change the meaning.  Any change was purely unintentional.

## 2019-03-31 NOTE — Progress Notes (Signed)
Pharmacy Antibiotic Note  Desiree Mccall is a 68 y.o. female admitted on 03/30/2019 with pneumonia.  Pharmacy has been consulted for Cefepime dosing. Patient is Covid + on Remdesivir.  Plan: Cefepime 2g IV q12h  Height: 5\' 4"  (162.6 cm) Weight: 231 lb 4.2 oz (104.9 kg) [name] (Calculated) : 54.7  Temp (24hrs), Avg:98.3 F (36.8 C), Min:97.2 F (36.2 C), Max:102.5 F (39.2 C)  Recent Labs  Lab 03/30/19 2124 03/30/19 2134 03/30/19 2317 03/31/19 0556  WBC 11.3*  --   --  11.0*  CREATININE 1.22*  --   --  0.77  LATICACIDVEN  --  5.7* 2.2*  --     Estimated Creatinine Clearance: 81.7 mL/min (by C-G formula based on SCr of 0.77 mg/dL).    [name]  Antimicrobials this admission: Cefepime 11/28 >>  Metronidazole 11/28 >> 11/28 Doxycycline 11/29 >>  Remdesivir 11/29 >>   Dose adjustments this admission:   Microbiology results:  Thank you for allowing pharmacy to be a part of this patient's care.  Paulina Fusi, PharmD, BCPS 03/31/2019 9:00 AM

## 2019-03-31 NOTE — ED Notes (Addendum)
Date and time results received: 03/31/19 0224 (use smartphrase ".now" to insert current time)  Test: Troponin Critical Value: 55  Name of Provider Notified: Burman Nieves, NP (by phone)  Orders Received? Or Actions Taken?: no verbal orders received

## 2019-03-31 NOTE — Progress Notes (Signed)
ANTICOAGULATION CONSULT NOTE - Initial Consult  Pharmacy Consult for Heparin Drip Indication: NSTEMI  [name]  Patient Measurements: Height: 5\' 4"  (162.6 cm) Weight: 231 lb 4.2 oz (104.9 kg) [name] (Calculated) : 54.7 Heparin Dosing Weight:    Vital Signs: Temp: 97.7 F (36.5 C) (11/29 1800) Temp Source: Other (Comment) (11/29 0800) BP: 128/52 (11/29 1800) Pulse Rate: 60 (11/29 1800)  Labs: Recent Labs    03/30/19 2124 03/31/19 0117 03/31/19 0556 03/31/19 0825 03/31/19 1717  HGB 11.0*  --  9.1*  --   --   HCT 35.4*  --  29.1*  --   --   PLT 291  --  201  --   --   APTT 28  --   --   --  44*  [name] 21.4*  --   --   --   --   [name] 1.9*  --   --   --   --   HEPARINUNFRC  --   --   --  1.76*  --   CREATININE 1.22*  --  0.77  --   --   TROPONINIHS 42* 76*  --  63*  --     Estimated Creatinine Clearance: 81.7 mL/min (by C-G formula based on SCr of 0.77 mg/dL).   Medical History: Past Medical History:  Diagnosis Date  . Aortic dissection ([place]) 2010  . CHF (congestive heart failure) ([place])   . Diabetes mellitus without complication ([place])   . [name]   . Heart attack ([place]) sept. 25, 2010   Assessment: Patient is a 68yo female admitted with Covid pneumonia. Pharmacy consulted for Heparin dosing for NSTEMI. Patient was taking Xarelto prior to admission, unclear when last dose was given. Baseline HL is elevated, will need to use aPTT to guide therapy.  Goal of Therapy:  APTT: 66-102 seconds Heparin level 0.3-0.7 units/ml Monitor platelets by anticoagulation protocol: Yes   Plan:  11/29@1717 : aPTT 44, subtherapeutic. Will increase heparin to 1150 units/hr IV without a bolus.  Will check a aPTT in 6 hours, Heparin level with AM labs. Daily CBC while on Heparin drip.  Pearla Dubonnet, PharmD Clinical Pharmacist 03/31/2019 6:53 PM

## 2019-03-31 NOTE — ED Notes (Signed)
Pt belongings in specimen bag hanging on bed mounted IV post at pt head. Contents of bag are one pair of glasses and one cell phone

## 2019-04-01 ENCOUNTER — Inpatient Hospital Stay: Payer: Medicare HMO

## 2019-04-01 LAB — GLUCOSE, CAPILLARY
Glucose-Capillary: 178 mg/dL — ABNORMAL HIGH (ref 70–99)
Glucose-Capillary: 182 mg/dL — ABNORMAL HIGH (ref 70–99)
Glucose-Capillary: 187 mg/dL — ABNORMAL HIGH (ref 70–99)
Glucose-Capillary: 245 mg/dL — ABNORMAL HIGH (ref 70–99)
Glucose-Capillary: 164 mg/dL — ABNORMAL HIGH (ref 70–99)
Glucose-Capillary: 134 mg/dL — ABNORMAL HIGH (ref 70–99)
Glucose-Capillary: 93 mg/dL (ref 70–99)

## 2019-04-01 LAB — RENAL FUNCTION PANEL
Albumin: 3.4 g/dL — ABNORMAL LOW (ref 3.5–5.0)
Anion gap: 8 (ref 5–15)
BUN: 33 mg/dL — ABNORMAL HIGH (ref 8–23)
CO2: 24 mmol/L (ref 22–32)
Calcium: 8.1 mg/dL — ABNORMAL LOW (ref 8.9–10.3)
Chloride: 108 mmol/L (ref 98–111)
Creatinine, Ser: 0.86 mg/dL (ref 0.44–1.00)
GFR calc Af Amer: >60 mL/min (ref >60)
GFR calc non Af Amer: >60 mL/min (ref >60)
Glucose, Bld: 180 mg/dL — ABNORMAL HIGH (ref 70–99)
Phosphorus: 4.5 mg/dL (ref 2.5–4.6)
Potassium: 3.5 mmol/L (ref 3.5–5.1)
Sodium: 140 mmol/L (ref 135–145)

## 2019-04-01 LAB — APTT: aPTT: 62 seconds — ABNORMAL HIGH (ref 24–36)

## 2019-04-01 LAB — CBC
HCT: 29.4 % — ABNORMAL LOW (ref 36.0–46.0)
Hemoglobin: 9 g/dL — ABNORMAL LOW (ref 12.0–15.0)
MCH: 25.3 pg — ABNORMAL LOW (ref 26.0–34.0)
MCHC: 30.6 g/dL (ref 30.0–36.0)
MCV: 82.6 fL (ref 80.0–100.0)
Platelets: 207 10*3/uL (ref 150–400)
RBC: 3.56 MIL/uL — ABNORMAL LOW (ref 3.87–5.11)
RDW: 16.6 % — ABNORMAL HIGH (ref 11.5–15.5)
WBC: 12.9 10*3/uL — ABNORMAL HIGH (ref 4.0–10.5)
nRBC: 0 % (ref 0.0–0.2)

## 2019-04-01 LAB — BRAIN NATRIURETIC PEPTIDE: B Natriuretic Peptide: 172 pg/mL — ABNORMAL HIGH (ref 0.0–100.0)

## 2019-04-01 LAB — MAGNESIUM: Magnesium: 2 mg/dL (ref 1.7–2.4)

## 2019-04-01 LAB — HEPARIN LEVEL (UNFRACTIONATED): Heparin Unfractionated: 0.86 IU/mL — ABNORMAL HIGH (ref 0.30–0.70)

## 2019-04-01 LAB — PROCALCITONIN: Procalcitonin: 8.24 ng/mL

## 2019-04-01 MED ORDER — DEXAMETHASONE SODIUM PHOSPHATE 4 MG/ML IJ SOLN
6.0000 mg | INTRAMUSCULAR | Status: DC
  Administered 2019-04-01: 6 mg via INTRAVENOUS
  Filled 2019-04-01: qty 2

## 2019-04-01 MED ORDER — ENOXAPARIN SODIUM 120 MG/0.8ML ~~LOC~~ SOLN
105.0000 mg | Freq: Two times a day (BID) | SUBCUTANEOUS | Status: DC
  Administered 2019-04-01 – 2019-04-02 (×3): 105 mg via SUBCUTANEOUS
  Filled 2019-04-01 (×4): qty 0.8

## 2019-04-01 MED ORDER — BUDESONIDE 0.5 MG/2ML IN SUSP: 0.5000 mg | Freq: Two times a day (BID) | RESPIRATORY_TRACT | Status: DC

## 2019-04-01 MED ORDER — IPRATROPIUM-ALBUTEROL 20-100 MCG/ACT IN AERS
1.0000 | INHALATION_SPRAY | Freq: Four times a day (QID) | RESPIRATORY_TRACT | Status: DC
  Administered 2019-04-01 – 2019-04-02 (×4): 1 via RESPIRATORY_TRACT
  Filled 2019-04-01: qty 4

## 2019-04-01 NOTE — Progress Notes (Signed)
Patient extubated to [place] with no complications. Saturations are 91-93% at this time.

## 2019-04-01 NOTE — Progress Notes (Signed)
Extubated to 4 L N/C. Tolerate3d procedure well. Stridor noted at first but settled down to slow deep breaths. Patient exhausted but alert and reoriented quickly. Resting quietly.

## 2019-04-01 NOTE — Progress Notes (Signed)
ANTICOAGULATION CONSULT NOTE -  Pharmacy Consult for Heparin Drip Indication: NSTEMI  [name]  Patient Measurements: Height: 5\' 4"  (162.6 cm) Weight: 231 lb 4.2 oz (104.9 kg) [name] (Calculated) : 54.7 Heparin Dosing Weight:    Vital Signs: Temp: 98.6 F (37 C) (11/30 0100) BP: 116/67 (11/30 0100) Pulse Rate: 69 (11/30 0100)  Labs: Recent Labs    03/30/19 2124 03/31/19 0117 03/31/19 0556 03/31/19 0825 03/31/19 1717 04/01/19 0114  HGB 11.0*  --  9.1*  --   --   --   HCT 35.4*  --  29.1*  --   --   --   PLT 291  --  201  --   --   --   APTT 28  --   --   --  44* 62*  [name] 21.4*  --   --   --   --   --   [name] 1.9*  --   --   --   --   --   HEPARINUNFRC  --   --   --  1.76*  --   --   CREATININE 1.22*  --  0.77  --   --   --   TROPONINIHS 42* 76*  --  63*  --   --     Estimated Creatinine Clearance: 81.7 mL/min (by C-G formula based on SCr of 0.77 mg/dL).   Medical History: Past Medical History:  Diagnosis Date  . Aortic dissection ([place]) 2010  . CHF (congestive heart failure) ([place])   . Diabetes mellitus without complication ([place])   . [name]   . Heart attack ([place]) sept. 25, 2010   Assessment: Patient is a 68yo female admitted with Covid pneumonia. Pharmacy consulted for Heparin dosing for NSTEMI. Patient was taking Xarelto prior to admission, unclear when last dose was given. Baseline HL is elevated, will need to use aPTT to guide therapy.  Goal of Therapy:  APTT: 66-102 seconds Heparin level 0.3-0.7 units/ml Monitor platelets by anticoagulation protocol: Yes   Plan:  11/29@1717 : aPTT 44, subtherapeutic. Will increase heparin to 1150 units/hr IV without a bolus.  Will check a aPTT in 6 hours, Heparin level with AM labs. Daily CBC while on Heparin drip.  11/30 @ 0114 aPTT 62, subtherapeutic.  Increase Heparin to 1300 units/hr w/o bolus, recheck aPTT in 6 hours.  Daily CBC per protocol  Ena Dawley, PharmD Clinical  Pharmacist 04/01/2019 1:49 AM

## 2019-04-01 NOTE — Progress Notes (Signed)
PULMONARY / CRITICAL CARE MEDICINE  Name: Desiree Mccall MRN: [id number] DOB: 11/14/1950    LOS: 2   Reason for Admission: Acute hypoxic respiratory failure  Brief patient description: 68 year old female with an extensive medical history, admitted with acute hypoxic respiratory failure requiring emergent intubation and COVID-19 pneumonia  HPI: This is a 68 year old Caucasian female with a significant cardiac history that includes CAD (2010 Anter STEMI and LAD PCI, 06/2018 70% pRCA stenosis), PEA Arrest 05/2018, ICM (EF 43% TEE 10/2018, ICD 2011), PAF (dx 06/2018), hx aortic dissection (2010 Endovascular repair w/ L. 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  History is obtained from EMS and ED records as patient is currently intubated and sedated.  Per EMS, patient was severely tachypneic, diaphoretic, pale with SPO2 that was unreadable on room air.  She was placed on CPAP at 100% FiO2.  However upon arrival in the ED, patient was still pale, diaphoretic and severely tachypneic and hypoxic hence she was emergently intubated.  Her ED work-up was significant for hyperglycemia with a blood glucose of 377 mg/dL, elevated LFTs, lactic acid of 5.7, WBC of 11.3K, hemoglobin of 11.0, hematocrit of 35.4, fibrinogen 499, D-dimer 5178, creatinine of 1.22, GFR of 46, and the [name] 1.9.  Her EKG was unremarkable except for a QTC of 520.  COVID-19 antigen test was positive.  She is being admitted to the ICU for further management.   Past Medical History:  Diagnosis Date  . Aortic dissection (HCC) 2010  . CHF (congestive heart failure) (HCC)   . Diabetes mellitus without complication (HCC)   . Gall  bladder disease   . Heart attack (HCC) sept. 25, 2010   Past Surgical History:  Procedure Laterality Date  . ABDOMINAL HYSTERECTOMY    . CARDIAC SURGERY    . CAROTID STENT  2010  . CHOLECYSTECTOMY    . LEFT HEART CATH AND CORONARY ANGIOGRAPHY N/A 06/05/2018   Procedure: LEFT HEART CATH AND CORONARY ANGIOGRAPHY;  Surgeon: Laurier NancyKhan, Shaukat A, MD;  Location: [facility] INVASIVE CV LAB;  Service: Cardiovascular;  Laterality: N/A;  . STENT PLACE LEFT URETER ([facility] HX)     No current facility-administered medications on file prior to encounter.    Current Outpatient Medications on File Prior to Encounter  Medication Sig  . acetaminophen (TYLENOL) 325 MG tablet Take 2 tablets (650 mg total) by mouth every 4 (four) hours as needed for mild pain (temp > 101.5).  Marland Kitchen. albuterol (PROVENTIL) (2.5 MG/3ML) 0.083% nebulizer solution Take 3 mLs (2.5 mg total) by nebulization every 2 (two) hours as needed for wheezing or shortness of breath.  Marland Kitchen. aspirin EC 81 MG tablet Take 81 mg by mouth daily.  Marland Kitchen. atorvastatin (LIPITOR) 40 MG tablet Take 1 tablet (40 mg total) by mouth daily at 6 PM.  . benzonatate (TESSALON PERLES) 100 MG capsule Take 1 capsule (100 mg total) by mouth every 6 (six) hours as needed for cough.  . bisacodyl (DULCOLAX) 10 MG suppository Place 1 suppository (10 mg total) rectally daily as needed for moderate constipation.  . budesonide (PULMICORT) 0.5 MG/2ML nebulizer solution Take 2 mLs (0.5 mg total) by nebulization 2 (two) times daily.  . carvedilol (COREG) 6.25 MG tablet Take 1 tablet (6.25 mg total) by  mouth 2 (two) times daily with a meal. (Patient not taking: Reported on 02/27/2019)  . clopidogrel (PLAVIX) 75 MG tablet Take 1 tablet (75 mg total) by mouth every morning.  . docusate sodium (COLACE) 100 MG capsule Take 1 capsule (100 mg total) by mouth daily.  Marland Kitchen ENSURE MAX PROTEIN (ENSURE MAX PROTEIN) LIQD Take 330 mLs (11 oz total) by mouth 2 (two) times daily.  . famotidine (PEPCID) 20 MG tablet Take  1 tablet (20 mg total) by mouth 2 (two) times daily.  Marland Kitchen FLUoxetine (PROZAC) 20 MG capsule Take 2 capsules by mouth daily.   . fluticasone (FLONASE) 50 MCG/ACT nasal spray Place 1 spray into the nose daily.  . furosemide (LASIX) 20 MG tablet Take 20 mg by mouth daily.  . Insulin Detemir (LEVEMIR FLEXTOUCH) 100 UNIT/ML Pen Inject 30 Units into the skin daily.  Marland Kitchen ipratropium-albuterol (DUONEB) 0.5-2.5 (3) MG/3ML SOLN Take 3 mLs by nebulization every 4 (four) hours as needed for wheezing or shortness of breath.  . losartan (COZAAR) 50 MG tablet Take 100 mg by mouth daily.  . metFORMIN (GLUCOPHAGE) 1000 MG tablet Take 1,000 mg by mouth 2 (two) times daily.  . metoprolol succinate (TOPROL-XL) 100 MG 24 hr tablet Take 100 mg by mouth daily.  . montelukast (SINGULAIR) 10 MG tablet Take 10 mg by mouth at bedtime.  . Multiple Vitamin (MULTIVITAMIN WITH MINERALS) TABS tablet Take 1 tablet by mouth daily.  . pantoprazole (PROTONIX) 40 MG tablet Take 1 tablet (40 mg total) by mouth at bedtime.  . potassium chloride SA (K-DUR,KLOR-CON) 20 MEQ tablet Take 1 tablet (20 mEq total) by mouth every 2 (two) hours. (Patient not taking: Reported on 02/27/2019)  . rivaroxaban (XARELTO) 20 MG TABS tablet Take 20 mg by mouth daily.  Marland Kitchen senna-docusate (SENOKOT-S) 8.6-50 MG tablet Take 1 tablet by mouth 2 (two) times daily as needed for mild constipation.  Marland Kitchen spironolactone (ALDACTONE) 25 MG tablet Take 1 tablet (25 mg total) by mouth daily. (Patient not taking: Reported on 02/27/2019)    Allergies [name]  Family History Family History  Problem Relation Age of Onset  . Heart failure Mother   . Heart failure Father    Social History  reports that she has quit smoking. Her smoking use included cigarettes. She started smoking about 8 years ago. She smoked 0.50 packs per day. She has never used smokeless tobacco. She reports current alcohol use. She reports that she does not use drugs.*Per my office  records:The patient has smoked half to 1 pack/day since age 71.  She had a few years of a hiatus but for the most part had been consistently smoking until very recently.   Review Of Systems: Unable to obtain as patient is intubated and sedated  VITAL SIGNS: BP (!) 147/58   Pulse 97   Temp 100 F (37.8 C)   Resp 17   Ht  (1.626 m)   Wt 105.9 kg   SpO2 99%   BMI 40.07 kg/m   HEMODYNAMICS:    VENTILATOR SETTINGS: Vent Mode: PRVC FiO2 (%):  [35 %-40 %] 35 % Set Rate:  [22 bmp-26 bmp] 22 bmp Vt Set:  [500 mL] 500 mL PEEP:  [5 cmH20-8 cmH20] 5 cmH20 Pressure Support:  [5 cmH20-15 cmH20] 15 cmH20 Plateau Pressure:  [16 cmH20-26 cmH20] 26 cmH20  INTAKE / OUTPUT: I/O last 3 completed shifts: In: 4243.2 [I.V.:2015.2; NG/GT:489.5; IV Piggyback:1738.6] Out: 1505 [Urine:1505]  PHYSICAL EXAMINATION: Exam was limited due to required  PPE in the setting of COVID-19 pandemic General: Critically ill-appearing obese woman, intubated, mechanically ventilated, sedated.  She is pale. HEENT: Orotracheally intubated, OG in place Neuro: Sedated, cannot assess any further. Cardiovascular: Bradycardic, regular rate, cannot appreciate murmurs due to lung sounds Lungs: Rales and rhonchi throughout, no wheezes. Abdomen: Obese, soft, normoactive bowel sounds  Musculoskeletal: No joint effusions or deformity, no cyanosis or clubbing. Skin: No petechia or rashes, warm and dry.  LABS:  BMET Recent Labs  Lab 03/30/19 2124 03/31/19 0556 04/01/19 0559  NA 137 140 140  K 4.5 4.1 3.5  CL 103 109 108  CO2 19* 22 24  BUN 19 21 33*  CREATININE 1.22* 0.77 0.86  GLUCOSE 377* 173* 180*    Electrolytes Recent Labs  Lab 03/30/19 2124 03/31/19 0556 04/01/19 0559  CALCIUM 8.8* 8.1* 8.1*  MG  --   --  2.0  PHOS  --   --  4.5    CBC Recent Labs  Lab 03/30/19 2124 03/31/19 0556 04/01/19 0559  WBC 11.3* 11.0* 12.9*  HGB 11.0* 9.1* 9.0*  HCT 35.4* 29.1* 29.4*  PLT 291 201 207     Coag's Recent Labs  Lab 03/30/19 2124 03/31/19 1717 04/01/19 0114  APTT 28 44* 62*  [name] 1.9*  --   --     Sepsis Markers Recent Labs  Lab 03/30/19 2134 03/30/19 2139 03/30/19 2317 03/31/19 0556 03/31/19 0825 04/01/19 0559  LATICACIDVEN 5.7*  --  2.2*  --  0.8  --   PROCALCITON  --  <0.10  --  7.21  --  8.24    ABG Recent Labs  Lab 03/31/19 1200 03/31/19 1432 03/31/19 2300  PHART 7.19* 7.29* 7.35  PCO2ART 67* 51* 45  PO2ART 81* 77* 103    Liver Enzymes Recent Labs  Lab 03/30/19 2124 04/01/19 0559  AST 112*  --   ALT 56*  --   ALKPHOS 203*  --   BILITOT 1.0  --   ALBUMIN 3.9 3.4*    Cardiac Enzymes No results for input(s): TROPONINI, PROBNP in the last 168 hours.  Glucose Recent Labs  Lab 03/31/19 1536 03/31/19 2021 04/01/19 0022 04/01/19 0409 04/01/19 0821 04/01/19 1140  GLUCAP 144* 166* 178* 182* 187* 245*    Imaging Dg Chest Port 1 View  Result Date: 04/01/2019 CLINICAL DATA:  Acute respiratory failure. EXAM: PORTABLE CHEST 1 VIEW COMPARISON:  03/31/2019. FINDINGS: Endotracheal tube and NG tube in stable position. Cardiac pacer stable position. Aortic stent graft and vascular coils noted in stable position. Stable cardiomegaly. No pulmonary venous congestion. No focal infiltrate. Mild bibasilar subsegmental atelectasis again noted. No pleural effusion noted on today's exam. No pneumothorax. IMPRESSION: 1.  Endotracheal tube and NG tube in stable position. 2. Cardiac pacer in stable position. Aortic stent graft and vascular coils noted stable position. Stable cardiomegaly. No pulmonary venous congestion. 3. Mild bibasilar subsegmental atelectasis again noted. No pleural effusion noted on today's exam. Electronically Signed   By: Marcello Moores  Register   On: 04/01/2019 06:13    STUDIES:  None  CULTURES: Blood cultures x2  ANTIBIOTICS: Doxycycline 03/31/2019> Cefepime 03/30/2019>  LINES/TUBES: Right femoral triple-lumen catheter  (ED) Peripheral IVs Foley catheter, necessary due to need for accurate I&O in the setting of critical illness.  DISCUSSION: 68 year old female presenting with acute hypoxic and hypercarbic respiratory failure secondary to COVID-19 pneumonia, septic shock, hyperglycemia, elevated troponin, and AKI requiring emergent intubation  ASSESSMENT / PLAN:  PULMONARY A: Acute hypoxic and hypercarbic respiratory failure COVID-19  pneumonia Lactic acidosis P:   -Patient is status post spontaneous breathing trial -Successfully liberated from mechanical ventilation -Plan to continue remdesivir as per protocol -Currently on 4 L/min nasal cannula   CARDIOVASCULAR A:  Shock physiology has resolved patient is off of dopamine drip P:  Hemodynamics per ICU protocol -Stop fluids Stopped dopamine gtt. -Lovenox for anticoagulation   RENAL A:   Acute kidney injury likely ATN Cannot exclude early cardiorenal syndrome P:   Resolved DC nonessential nephrotoxic medications  GASTROINTESTINAL A:   Elevated LFTs P:   Trend LFTs  HEMATOLOGIC A:   Coagulopathy secondary to COVID-19 infection and atrial fibrillation on chronic anticoagulation-D-dimer elevated as well as [name] of 1.9 P:  Lovenox for anticoagulation INFECTIOUS A:   COVID-19 infection Sepsis  P:   Procalcitonin trending up-less than 0.1 to 7.21 this morning; continue to trend and adjust antibiotics appropriately May have secondary bacterial infection Pancultured: Sputum, blood, urine On cefepime and doxycycline Macrolides not utilized due to increased QT  ENDOCRINE A:   Type 2 diabetes mellitus with hyperglycemia P:   Blood glucose monitoring with sliding scale insulin coverage  NEUROLOGIC A:   Acute encephalopathy secondary to hypoxemia, hypercapnia and COVID-19 infection History of CVA P:   RASS goal: 0 to -1 Fentanyl, Versed infusion (not tolerating propofol due to bradycardia hypotension) for vent sedation and  discomfort Discontinue ketamine and propofol  continue to monitor mental status and wean down sedation as tolerated  Best Practice: Code Status: Full code Diet: NPO for now, initiate tube feeds as soon as feasible GI prophylaxis: Pepcid VTE prophylaxis: SCDs and already on heparin gtt  FAMILY  Updates: I have spoken to daughter, Lenord Fellers will update with any changes in treatment plan and patient condition.Ashok Cordia will also contact her sister Williemae Natter.  We will discuss goals of care as necessary.   Critical care provider statement:    Critical care time (minutes):  32   Critical care time was exclusive of:  Separately billable procedures and  treating other patients   Critical care was necessary to treat or prevent imminent or  life-threatening deterioration of the following conditions:   COVID-19 infection, circulatory shock, acute kidney injury, multiple comorbid conditions   Critical care was time spent personally by me on the following  activities:  Development of treatment plan with patient or surrogate,  discussions with consultants, evaluation of patient's response to  treatment, examination of patient, obtaining history from patient or  surrogate, ordering and performing treatments and interventions, ordering  and review of laboratory studies and re-evaluation of patient's condition   I assumed direction of critical care for this patient from another  provider in my specialty: no      Vida Rigger, M.D.  Pulmonary & Critical Care Medicine  [facility]    *This note was dictated using voice recognition software/Dragon.  Despite best efforts to proofread, errors can occur which can change the meaning.  Any change was purely unintentional.

## 2019-04-02 ENCOUNTER — Other Ambulatory Visit: Payer: Self-pay

## 2019-04-02 ENCOUNTER — Inpatient Hospital Stay (HOSPITAL_COMMUNITY): Payer: Medicare HMO

## 2019-04-02 ENCOUNTER — Inpatient Hospital Stay (HOSPITAL_COMMUNITY)
Admission: AD | Admit: 2019-04-02 | Discharge: 2019-04-07 | DRG: 177 | Disposition: A | Discharge status: Home Health | Payer: Medicare HMO | Source: Other Acute Inpatient Hospital | Attending: Internal Medicine | Admitting: Internal Medicine

## 2019-04-02 DIAGNOSIS — I513 Intracardiac thrombosis, not elsewhere classified: Secondary | ICD-10-CM | POA: Diagnosis present

## 2019-04-02 DIAGNOSIS — Z8673 Personal history of transient ischemic attack (TIA), and cerebral infarction without residual deficits: Secondary | ICD-10-CM | POA: Diagnosis not present

## 2019-04-02 DIAGNOSIS — J9601 Acute respiratory failure with hypoxia: Secondary | ICD-10-CM | POA: Diagnosis present

## 2019-04-02 DIAGNOSIS — Z95820 Peripheral vascular angioplasty status with implants and grafts: Secondary | ICD-10-CM

## 2019-04-02 DIAGNOSIS — B9689 Other specified bacterial agents as the cause of diseases classified elsewhere: Secondary | ICD-10-CM | POA: Diagnosis present

## 2019-04-02 DIAGNOSIS — E119 Type 2 diabetes mellitus without complications: Secondary | ICD-10-CM | POA: Diagnosis present

## 2019-04-02 DIAGNOSIS — E785 Hyperlipidemia, unspecified: Secondary | ICD-10-CM | POA: Diagnosis present

## 2019-04-02 DIAGNOSIS — I255 Ischemic cardiomyopathy: Secondary | ICD-10-CM | POA: Diagnosis present

## 2019-04-02 DIAGNOSIS — I2510 Atherosclerotic heart disease of native coronary artery without angina pectoris: Secondary | ICD-10-CM | POA: Diagnosis present

## 2019-04-02 DIAGNOSIS — J159 Unspecified bacterial pneumonia: Secondary | ICD-10-CM | POA: Diagnosis present

## 2019-04-02 DIAGNOSIS — Z8674 Personal history of sudden cardiac arrest: Secondary | ICD-10-CM

## 2019-04-02 DIAGNOSIS — R0602 Shortness of breath: Secondary | ICD-10-CM | POA: Diagnosis not present

## 2019-04-02 DIAGNOSIS — U071 COVID-19: Principal | ICD-10-CM

## 2019-04-02 DIAGNOSIS — Z6839 Body mass index (BMI) 39.0-39.9, adult: Secondary | ICD-10-CM

## 2019-04-02 DIAGNOSIS — J1289 Other viral pneumonia: Secondary | ICD-10-CM | POA: Diagnosis present

## 2019-04-02 DIAGNOSIS — Z79899 Other long term (current) drug therapy: Secondary | ICD-10-CM

## 2019-04-02 DIAGNOSIS — Z7982 Long term (current) use of aspirin: Secondary | ICD-10-CM

## 2019-04-02 DIAGNOSIS — I480 Paroxysmal atrial fibrillation: Secondary | ICD-10-CM | POA: Diagnosis present

## 2019-04-02 DIAGNOSIS — J189 Pneumonia, unspecified organism: Secondary | ICD-10-CM | POA: Diagnosis not present

## 2019-04-02 DIAGNOSIS — I5043 Acute on chronic combined systolic (congestive) and diastolic (congestive) heart failure: Secondary | ICD-10-CM | POA: Diagnosis present

## 2019-04-02 DIAGNOSIS — E669 Obesity, unspecified: Secondary | ICD-10-CM | POA: Diagnosis present

## 2019-04-02 DIAGNOSIS — I252 Old myocardial infarction: Secondary | ICD-10-CM | POA: Diagnosis not present

## 2019-04-02 DIAGNOSIS — J440 Chronic obstructive pulmonary disease with acute lower respiratory infection: Secondary | ICD-10-CM | POA: Diagnosis present

## 2019-04-02 DIAGNOSIS — G4733 Obstructive sleep apnea (adult) (pediatric): Secondary | ICD-10-CM | POA: Diagnosis present

## 2019-04-02 DIAGNOSIS — Z9861 Coronary angioplasty status: Secondary | ICD-10-CM | POA: Diagnosis not present

## 2019-04-02 DIAGNOSIS — F17210 Nicotine dependence, cigarettes, uncomplicated: Secondary | ICD-10-CM | POA: Diagnosis present

## 2019-04-02 DIAGNOSIS — Z95810 Presence of automatic (implantable) cardiac defibrillator: Secondary | ICD-10-CM

## 2019-04-02 DIAGNOSIS — Z7901 Long term (current) use of anticoagulants: Secondary | ICD-10-CM

## 2019-04-02 DIAGNOSIS — Z8249 Family history of ischemic heart disease and other diseases of the circulatory system: Secondary | ICD-10-CM

## 2019-04-02 DIAGNOSIS — R7881 Bacteremia: Secondary | ICD-10-CM | POA: Diagnosis present

## 2019-04-02 DIAGNOSIS — Z9111 Patient's noncompliance with dietary regimen: Secondary | ICD-10-CM

## 2019-04-02 DIAGNOSIS — Z952 Presence of prosthetic heart valve: Secondary | ICD-10-CM

## 2019-04-02 LAB — CBC WITH DIFFERENTIAL/PLATELET
Abs Immature Granulocytes: 0.04 10*3/uL (ref 0.00–0.07)
Basophils Absolute: 0 10*3/uL (ref 0.0–0.1)
Basophils Relative: 0 %
Eosinophils Absolute: 0 10*3/uL (ref 0.0–0.5)
Eosinophils Relative: 0 %
HCT: 28.9 % — ABNORMAL LOW (ref 36.0–46.0)
Hemoglobin: 8.6 g/dL — ABNORMAL LOW (ref 12.0–15.0)
Immature Granulocytes: 1 %
Lymphocytes Relative: 5 %
Lymphs Abs: 0.4 10*3/uL — ABNORMAL LOW (ref 0.7–4.0)
MCH: 25.2 pg — ABNORMAL LOW (ref 26.0–34.0)
MCHC: 29.8 g/dL — ABNORMAL LOW (ref 30.0–36.0)
MCV: 84.8 fL (ref 80.0–100.0)
Monocytes Absolute: 0.2 10*3/uL (ref 0.1–1.0)
Monocytes Relative: 2 %
Neutro Abs: 7.8 10*3/uL — ABNORMAL HIGH (ref 1.7–7.7)
Neutrophils Relative %: 92 %
Platelets: 194 10*3/uL (ref 150–400)
RBC: 3.41 MIL/uL — ABNORMAL LOW (ref 3.87–5.11)
RDW: 16.8 % — ABNORMAL HIGH (ref 11.5–15.5)
WBC: 8.5 10*3/uL (ref 4.0–10.5)
nRBC: 0 % (ref 0.0–0.2)

## 2019-04-02 LAB — BASIC METABOLIC PANEL
Anion gap: 7 (ref 5–15)
BUN: 29 mg/dL — ABNORMAL HIGH (ref 8–23)
CO2: 25 mmol/L (ref 22–32)
Calcium: 8.8 mg/dL — ABNORMAL LOW (ref 8.9–10.3)
Chloride: 110 mmol/L (ref 98–111)
Creatinine, Ser: 0.53 mg/dL (ref 0.44–1.00)
GFR calc Af Amer: >60 mL/min (ref >60)
GFR calc non Af Amer: >60 mL/min (ref >60)
Glucose, Bld: 169 mg/dL — ABNORMAL HIGH (ref 70–99)
Potassium: 4.4 mmol/L (ref 3.5–5.1)
Sodium: 142 mmol/L (ref 135–145)

## 2019-04-02 LAB — CBC
HCT: 28.7 % — ABNORMAL LOW (ref 36.0–46.0)
Hemoglobin: 8.6 g/dL — ABNORMAL LOW (ref 12.0–15.0)
MCH: 25.6 pg — ABNORMAL LOW (ref 26.0–34.0)
MCHC: 30 g/dL (ref 30.0–36.0)
MCV: 85.4 fL (ref 80.0–100.0)
Platelets: 218 10*3/uL (ref 150–400)
RBC: 3.36 MIL/uL — ABNORMAL LOW (ref 3.87–5.11)
RDW: 17.1 % — ABNORMAL HIGH (ref 11.5–15.5)
WBC: 10.8 10*3/uL — ABNORMAL HIGH (ref 4.0–10.5)
nRBC: 0 % (ref 0.0–0.2)

## 2019-04-02 LAB — URINE CULTURE: Culture: NO GROWTH

## 2019-04-02 LAB — BRAIN NATRIURETIC PEPTIDE: B Natriuretic Peptide: 896.3 pg/mL — ABNORMAL HIGH (ref 0.0–100.0)

## 2019-04-02 LAB — D-DIMER, QUANTITATIVE: D-Dimer, Quant: 4 ug/mL-FEU — ABNORMAL HIGH (ref 0.00–0.50)

## 2019-04-02 LAB — CREATININE, SERUM
Creatinine, Ser: 0.51 mg/dL (ref 0.44–1.00)
GFR calc Af Amer: >60 mL/min (ref >60)
GFR calc non Af Amer: >60 mL/min (ref >60)

## 2019-04-02 LAB — CULTURE, RESPIRATORY W GRAM STAIN
Culture: NORMAL
Special Requests: NORMAL

## 2019-04-02 LAB — GLUCOSE, CAPILLARY
Glucose-Capillary: 169 mg/dL — ABNORMAL HIGH (ref 70–99)
Glucose-Capillary: 145 mg/dL — ABNORMAL HIGH (ref 70–99)
Glucose-Capillary: 165 mg/dL — ABNORMAL HIGH (ref 70–99)
Glucose-Capillary: 87 mg/dL (ref 70–99)
Glucose-Capillary: 210 mg/dL — ABNORMAL HIGH (ref 70–99)

## 2019-04-02 LAB — MAGNESIUM
Magnesium: 2.3 mg/dL (ref 1.7–2.4)
Magnesium: 1.9 mg/dL (ref 1.7–2.4)

## 2019-04-02 LAB — TYPE AND SCREEN
ABO/RH(D): B POS
Antibody Screen: NEGATIVE

## 2019-04-02 LAB — PROCALCITONIN
Procalcitonin: 3.03 ng/mL
Procalcitonin: 2.23 ng/mL

## 2019-04-02 LAB — C-REACTIVE PROTEIN: CRP: 10.7 mg/dL — ABNORMAL HIGH (ref <1.0)

## 2019-04-02 MED ORDER — INSULIN ASPART 100 UNIT/ML ~~LOC~~ SOLN: 0.0000 [IU] | Freq: Every day | SUBCUTANEOUS | Status: DC

## 2019-04-02 MED ORDER — INSULIN ASPART 100 UNIT/ML ~~LOC~~ SOLN: 0.0000 [IU] | Freq: Three times a day (TID) | SUBCUTANEOUS | Status: DC

## 2019-04-02 MED ORDER — DEXAMETHASONE 4 MG PO TABS
4.0000 mg | ORAL_TABLET | Freq: Every day | ORAL | Status: DC
  Administered 2019-04-02 – 2019-04-07 (×6): 4 mg via ORAL
  Filled 2019-04-02 (×5): qty 1

## 2019-04-02 MED ORDER — VITAMIN D3 25 MCG PO TABS: 2000.0000 [IU] | ORAL_TABLET | Freq: Every day | ORAL | Status: DC

## 2019-04-02 MED ORDER — ADULT MULTIVITAMIN W/MINERALS CH: 1.0000 | ORAL_TABLET | Freq: Every day | ORAL | Status: DC

## 2019-04-02 MED ORDER — VITAMIN B-1 100 MG PO TABS: 100.0000 mg | ORAL_TABLET | Freq: Every day | ORAL | Status: DC

## 2019-04-02 MED ORDER — CARVEDILOL 3.125 MG PO TABS
6.2500 mg | ORAL_TABLET | Freq: Two times a day (BID) | ORAL | Status: DC
  Administered 2019-04-02: 17:00:00 6.25 mg via ORAL
  Filled 2019-04-02 (×2): qty 2

## 2019-04-02 MED ORDER — INSULIN ASPART 100 UNIT/ML ~~LOC~~ SOLN
0.0000 [IU] | Freq: Every day | SUBCUTANEOUS | Status: DC
  Administered 2019-04-02: 21:00:00 2 [IU] via SUBCUTANEOUS
  Administered 2019-04-04 – 2019-04-06 (×3): 3 [IU] via SUBCUTANEOUS

## 2019-04-02 MED ORDER — ATORVASTATIN CALCIUM 20 MG PO TABS: 40.0000 mg | ORAL_TABLET | ORAL | Status: DC

## 2019-04-02 MED ORDER — ONDANSETRON HCL 4 MG/2ML IJ SOLN: 4.0000 mg | Freq: Four times a day (QID) | INTRAMUSCULAR | Status: DC | PRN

## 2019-04-02 MED ORDER — ONDANSETRON HCL 4 MG PO TABS: 4.0000 mg | ORAL_TABLET | Freq: Four times a day (QID) | ORAL | Status: DC | PRN

## 2019-04-02 MED ORDER — ALBUTEROL SULFATE HFA 108 (90 BASE) MCG/ACT IN AERS
2.0000 | INHALATION_SPRAY | Freq: Four times a day (QID) | RESPIRATORY_TRACT | Status: DC | PRN
  Filled 2019-04-02: qty 6.7

## 2019-04-02 MED ORDER — INSULIN GLARGINE 100 UNIT/ML ~~LOC~~ SOLN
15.0000 [IU] | Freq: Two times a day (BID) | SUBCUTANEOUS | Status: DC
  Administered 2019-04-02 – 2019-04-07 (×10): 15 [IU] via SUBCUTANEOUS
  Filled 2019-04-02 (×12): qty 0.15

## 2019-04-02 MED ORDER — LOSARTAN POTASSIUM 25 MG PO TABS
25.0000 mg | ORAL_TABLET | Freq: Every day | ORAL | Status: DC
  Administered 2019-04-02 – 2019-04-07 (×6): 25 mg via ORAL
  Filled 2019-04-02 (×6): qty 1

## 2019-04-02 MED ORDER — CLOPIDOGREL BISULFATE 75 MG PO TABS
75.0000 mg | ORAL_TABLET | Freq: Every day | ORAL | Status: DC
  Administered 2019-04-02 – 2019-04-07 (×6): 75 mg via ORAL
  Filled 2019-04-02 (×5): qty 1

## 2019-04-02 MED ORDER — PANTOPRAZOLE SODIUM 40 MG PO TBEC
40.0000 mg | DELAYED_RELEASE_TABLET | Freq: Every day | ORAL | Status: DC
  Administered 2019-04-02 – 2019-04-07 (×6): 40 mg via ORAL
  Filled 2019-04-02 (×5): qty 1

## 2019-04-02 MED ORDER — METFORMIN HCL 500 MG PO TABS
1000.0000 mg | ORAL_TABLET | Freq: Two times a day (BID) | ORAL | Status: DC
  Filled 2019-04-02: qty 2

## 2019-04-02 MED ORDER — CHOLECALCIFEROL 10 MCG/ML (400 UNIT/ML) PO LIQD: 2000.0000 [IU] | Freq: Every day | ORAL | Status: DC

## 2019-04-02 MED ORDER — FAMOTIDINE 20 MG PO TABS: 20.0000 mg | ORAL_TABLET | Freq: Two times a day (BID) | ORAL | Status: DC

## 2019-04-02 MED ORDER — RIVAROXABAN 20 MG PO TABS
20.0000 mg | ORAL_TABLET | Freq: Every day | ORAL | Status: DC
  Administered 2019-04-02 – 2019-04-06 (×5): 20 mg via ORAL
  Filled 2019-04-02 (×6): qty 1

## 2019-04-02 MED ORDER — ACETAMINOPHEN 325 MG PO TABS: 650.0000 mg | ORAL_TABLET | Freq: Four times a day (QID) | ORAL | Status: DC | PRN

## 2019-04-02 MED ORDER — ASPIRIN EC 81 MG PO TBEC
81.0000 mg | DELAYED_RELEASE_TABLET | Freq: Every day | ORAL | Status: DC
  Administered 2019-04-02 – 2019-04-07 (×6): 81 mg via ORAL
  Filled 2019-04-02 (×5): qty 1

## 2019-04-02 MED ORDER — SODIUM CHLORIDE 0.9 % IV SOLN
100.0000 mg | Freq: Every day | INTRAVENOUS | Status: AC
  Administered 2019-04-03 – 2019-04-04 (×2): 100 mg via INTRAVENOUS
  Filled 2019-04-02 (×2): qty 100

## 2019-04-02 MED ORDER — ZINC SULFATE 220 (50 ZN) MG PO CAPS: 220.0000 mg | ORAL_CAPSULE | Freq: Every day | ORAL | Status: DC

## 2019-04-02 MED ORDER — DOXYCYCLINE HYCLATE 100 MG PO TABS
100.0000 mg | ORAL_TABLET | Freq: Two times a day (BID) | ORAL | Status: DC
  Administered 2019-04-02 – 2019-04-07 (×10): 100 mg via ORAL
  Filled 2019-04-02 (×11): qty 1

## 2019-04-02 MED ORDER — ATORVASTATIN CALCIUM 40 MG PO TABS
40.0000 mg | ORAL_TABLET | Freq: Every day | ORAL | Status: DC
  Administered 2019-04-02 – 2019-04-06 (×5): 40 mg via ORAL
  Filled 2019-04-02 (×5): qty 1

## 2019-04-02 MED ORDER — BUDESONIDE 180 MCG/ACT IN AEPB
2.0000 | INHALATION_SPRAY | Freq: Two times a day (BID) | RESPIRATORY_TRACT | Status: DC
  Administered 2019-04-02: 2 via RESPIRATORY_TRACT
  Filled 2019-04-02: qty 1

## 2019-04-02 MED ORDER — HEPARIN SODIUM (PORCINE) 5000 UNIT/ML IJ SOLN: 5000.0000 [IU] | Freq: Three times a day (TID) | INTRAMUSCULAR | Status: DC

## 2019-04-02 MED ORDER — FLUOXETINE HCL 20 MG PO CAPS
20.0000 mg | ORAL_CAPSULE | Freq: Every day | ORAL | Status: DC
  Filled 2019-04-02: qty 1

## 2019-04-02 MED ORDER — BISACODYL 5 MG PO TBEC: 5.0000 mg | DELAYED_RELEASE_TABLET | Freq: Every day | ORAL | Status: DC | PRN

## 2019-04-02 MED ORDER — INSULIN ASPART 100 UNIT/ML ~~LOC~~ SOLN
0.0000 [IU] | Freq: Three times a day (TID) | SUBCUTANEOUS | Status: DC
  Administered 2019-04-03: 18:00:00 3 [IU] via SUBCUTANEOUS
  Administered 2019-04-03 (×2): 2 [IU] via SUBCUTANEOUS
  Administered 2019-04-04: 17:00:00 3 [IU] via SUBCUTANEOUS
  Administered 2019-04-04: 5 [IU] via SUBCUTANEOUS
  Administered 2019-04-05: 17:00:00 2 [IU] via SUBCUTANEOUS
  Administered 2019-04-05: 5 [IU] via SUBCUTANEOUS
  Administered 2019-04-06: 12:00:00 2 [IU] via SUBCUTANEOUS
  Administered 2019-04-06: 5 [IU] via SUBCUTANEOUS
  Administered 2019-04-07: 12:00:00 7 [IU] via SUBCUTANEOUS

## 2019-04-02 MED ORDER — SPIRONOLACTONE 25 MG PO TABS
25.0000 mg | ORAL_TABLET | Freq: Every day | ORAL | Status: DC
  Administered 2019-04-02 – 2019-04-07 (×6): 25 mg via ORAL
  Filled 2019-04-02 (×6): qty 1

## 2019-04-02 MED ORDER — ENSURE MAX PROTEIN PO LIQD
11.0000 [oz_av] | Freq: Three times a day (TID) | ORAL | Status: DC
  Filled 2019-04-02: qty 330

## 2019-04-02 MED ORDER — FUROSEMIDE 10 MG/ML IJ SOLN
40.0000 mg | Freq: Once | INTRAMUSCULAR | Status: AC
  Administered 2019-04-02: 17:00:00 40 mg via INTRAVENOUS
  Filled 2019-04-02: qty 4

## 2019-04-02 NOTE — Progress Notes (Signed)
PULMONARY / CRITICAL CARE MEDICINE  Name: Desiree Mccall MRN: [id number] DOB: 1950/12/17    LOS: 3   Reason for Admission: Acute hypoxic respiratory failure  Brief patient description: 68 year old female with an extensive medical history, admitted with acute hypoxic respiratory failure requiring emergent intubation and COVID-19 pneumonia  HPI: This is a 68 year old Caucasian female with a significant cardiac history that includes CAD (2010 Anter STEMI and LAD PCI, 06/2018 70% pRCA stenosis), PEA Arrest 05/2018, ICM (EF 43% TEE 10/2018, ICD 2011), PAF (dx 06/2018), hx aortic dissection (2010 Endovascular repair w/ L. 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  History is obtained from EMS and ED records as patient is currently intubated and sedated.  Per EMS, patient was severely tachypneic, diaphoretic, pale with SPO2 that was unreadable on room air.  She was placed on CPAP at 100% FiO2.  However upon arrival in the ED, patient was still pale, diaphoretic and severely tachypneic and hypoxic hence she was emergently intubated.  Her ED work-up was significant for hyperglycemia with a blood glucose of 377 mg/dL, elevated LFTs, lactic acid of 5.7, WBC of 11.3K, hemoglobin of 11.0, hematocrit of 35.4, fibrinogen 499, D-dimer 5178, creatinine of 1.22, GFR of 46, and the [name] 1.9.  Her EKG was unremarkable except for a QTC of 520.  COVID-19 antigen test was positive.  She is being admitted to the ICU for further management.    11/30 -patient extubated on 4-6L/min [place] 12/1 -patient improved, reports nausea and severe physical weakness unable to take care of self  Past Medical History:   Diagnosis Date  . Aortic dissection ([place]) 2010  . CHF (congestive heart failure) ([place])   . Diabetes mellitus without complication ([place])   . [name]   . Heart attack ([place]) sept. 25, 2010   Past Surgical History:  Procedure Laterality Date  . ABDOMINAL HYSTERECTOMY    . CARDIAC SURGERY    . CAROTID STENT  2010  . CHOLECYSTECTOMY    . LEFT HEART CATH AND CORONARY ANGIOGRAPHY N/A 06/05/2018   Procedure: LEFT HEART CATH AND CORONARY ANGIOGRAPHY;  Surgeon: Dionisio David, MD;  Location: [facility];  Service: Cardiovascular;  Laterality: N/A;  . STENT PLACE LEFT URETER ([place] HX)     No current facility-administered medications on file prior to encounter.    Current Outpatient Medications on File Prior to Encounter  Medication Sig  . aspirin EC 81 MG tablet Take 81 mg by mouth daily.  Marland Kitchen atorvastatin (LIPITOR) 40 MG tablet Take 1 tablet (40 mg total) by mouth daily at 6 PM.  . clopidogrel (PLAVIX) 75 MG tablet Take 1 tablet (75 mg total) by mouth every morning.  . famotidine (PEPCID) 20 MG tablet Take 1 tablet (20 mg total) by mouth 2 (two) times daily.  Marland Kitchen FLUoxetine (PROZAC) 20 MG capsule Take 2 capsules by mouth daily.   . fluticasone (FLONASE) 50 MCG/ACT nasal spray Place 1 spray into the nose daily.  . furosemide (LASIX) 20 MG tablet Take 20 mg by mouth daily.  . Insulin Detemir (LEVEMIR FLEXTOUCH) 100 UNIT/ML Pen Inject 30 Units into the skin daily.  Marland Kitchen losartan (COZAAR) 50 MG tablet Take 100 mg by mouth daily.  . metFORMIN (GLUCOPHAGE)  1000 MG tablet Take 1,000 mg by mouth 2 (two) times daily.  . metoprolol succinate (TOPROL-XL) 100 MG 24 hr tablet Take 100 mg by mouth daily.  . montelukast (SINGULAIR) 10 MG tablet Take 10 mg by mouth at bedtime.  . Multiple Vitamin (MULTIVITAMIN WITH MINERALS) TABS tablet Take 1 tablet by mouth daily.  . pantoprazole (PROTONIX) 40 MG tablet Take 1 tablet (40 mg total) by mouth at bedtime.  . rivaroxaban (XARELTO) 20 MG TABS  tablet Take 20 mg by mouth daily.  Marland Kitchen senna-docusate (SENOKOT-S) 8.6-50 MG tablet Take 1 tablet by mouth 2 (two) times daily as needed for mild constipation.  Marland Kitchen acetaminophen (TYLENOL) 325 MG tablet Take 2 tablets (650 mg total) by mouth every 4 (four) hours as needed for mild pain (temp > 101.5).  Marland Kitchen albuterol (PROVENTIL) (2.5 MG/3ML) 0.083% nebulizer solution Take 3 mLs (2.5 mg total) by nebulization every 2 (two) hours as needed for wheezing or shortness of breath.  . bisacodyl (DULCOLAX) 10 MG suppository Place 1 suppository (10 mg total) rectally daily as needed for moderate constipation.  . budesonide (PULMICORT) 0.5 MG/2ML nebulizer solution Take 2 mLs (0.5 mg total) by nebulization 2 (two) times daily.  . carvedilol (COREG) 6.25 MG tablet Take 1 tablet (6.25 mg total) by mouth 2 (two) times daily with a meal. (Patient not taking: Reported on 02/27/2019)  . docusate sodium (COLACE) 100 MG capsule Take 1 capsule (100 mg total) by mouth daily.  Marland Kitchen ENSURE MAX PROTEIN (ENSURE MAX PROTEIN) LIQD Take 330 mLs (11 oz total) by mouth 2 (two) times daily.  Marland Kitchen ipratropium-albuterol (DUONEB) 0.5-2.5 (3) MG/3ML SOLN Take 3 mLs by nebulization every 4 (four) hours as needed for wheezing or shortness of breath.  . potassium chloride SA (K-DUR,KLOR-CON) 20 MEQ tablet Take 1 tablet (20 mEq total) by mouth every 2 (two) hours. (Patient not taking: Reported on 02/27/2019)  . spironolactone (ALDACTONE) 25 MG tablet Take 1 tablet (25 mg total) by mouth daily. (Patient not taking: Reported on 02/27/2019)    Allergies [name]  Family History Family History  Problem Relation Age of Onset  . Heart failure Mother   . Heart failure Father    Social History  reports that she has quit smoking. Her smoking use included cigarettes. She started smoking about 8 years ago. She smoked 0.50 packs per day. She has never used smokeless tobacco. She reports current alcohol use. She reports that she does not use  drugs.*Per my office records:The patient has smoked half to 1 pack/day since age 79.  She had a few years of a hiatus but for the most part had been consistently smoking until very recently.   Review Of Systems: Unable to obtain as patient is intubated and sedated  VITAL SIGNS: BP 128/66   Pulse 88   Temp 99.1 F (37.3 C)   Resp (!) 21   Ht 5\' 4"  (1.626 m)   Wt 103.6 kg   SpO2 95%   BMI 39.20 kg/m      INTAKE / OUTPUT: I/O last 3 completed shifts: In: 1401.1 [I.V.:936.1; NG/GT:415; IV Piggyback:50] Out: 2101 [Urine:2101]  PHYSICAL EXAMINATION: Exam was limited due to required PPE in the setting of COVID-19 pandemic General: Critically ill-appearing obese woman, intubated, mechanically ventilated, sedated.  She is pale. HEENT: Orotracheally intubated, OG in place Neuro: Sedated, cannot assess any further. Cardiovascular: Bradycardic, regular rate, cannot appreciate murmurs due to lung sounds Lungs: Rales and rhonchi throughout, no wheezes. Abdomen: Obese, soft, normoactive  bowel sounds  Musculoskeletal: No joint effusions or deformity, no cyanosis or clubbing. Skin: No petechia or rashes, warm and dry.  LABS:  BMET Recent Labs  Lab 03/31/19 0556 04/01/19 0559 04/02/19 0441  NA 140 140 142  K 4.1 3.5 4.4  CL 109 108 110  CO2 BUN 21 33* 29*  CREATININE 0.77 0.86 0.53  GLUCOSE 173* 180* 169*    Electrolytes Recent Labs  Lab 03/31/19 0556 04/01/19 0559 04/02/19 0441  CALCIUM 8.1* 8.1* 8.8*  MG  --  2.0 2.3  PHOS  --  4.5  --     CBC Recent Labs  Lab 03/31/19 0556 04/01/19 0559 04/02/19 0441  WBC 11.0* 12.9* 8.5  HGB 9.1* 9.0* 8.6*  HCT 29.1* 29.4* 28.9*  PLT 201 207 194    Coag's Recent Labs  Lab 03/30/19 2124 03/31/19 1717 04/01/19 0114  APTT 28 44* 62*  [name] 1.9*  --   --     Sepsis Markers Recent Labs  Lab 03/30/19 2134  03/30/19 2317 03/31/19 0556 03/31/19 0825 04/01/19 0559 04/02/19 0441  LATICACIDVEN 5.7*  --  2.2*   --  0.8  --   --   PROCALCITON  --    < >  --  7.21  --  8.24 3.03   < > = values in this interval not displayed.    ABG Recent Labs  Lab 03/31/19 1200 03/31/19 1432 03/31/19 2300  PHART 7.19* 7.29* 7.35  PCO2ART 67* 51* 45  PO2ART 81* 77* 103    Liver Enzymes Recent Labs  Lab 03/30/19 2124 04/01/19 0559  AST 112*  --   ALT 56*  --   ALKPHOS 203*  --   BILITOT 1.0  --   ALBUMIN 3.9 3.4*    Cardiac Enzymes No results for input(s): TROPONINI, PROBNP in the last 168 hours.  Glucose Recent Labs  Lab 04/01/19 0821 04/01/19 1140 04/01/19 1635 04/01/19 2059 04/02/19 0137 04/02/19 0439  GLUCAP 187* 245* 134* 93 169* 145*    Imaging No results found.  STUDIES:  None  CULTURES: Blood cultures x2  ANTIBIOTICS: Doxycycline 03/31/2019> Cefepime 03/30/2019>  LINES/TUBES: Right femoral triple-lumen catheter (ED) Peripheral IVs Foley catheter, necessary due to need for accurate I&O in the setting of critical illness.  DISCUSSION: 68 year old female presenting with acute hypoxic and hypercarbic respiratory failure secondary to COVID-19 pneumonia, septic shock, hyperglycemia, elevated troponin, and AKI requiring emergent intubation  ASSESSMENT / PLAN:  PULMONARY A: Acute hypoxic and hypercarbic respiratory failure COVID-19 pneumonia Lactic acidosis P:   -Patient is status post spontaneous breathing trial -Successfully liberated from mechanical ventilation -Plan to continue remdesivir as per protocol -Currently on 4 L/min nasal cannula   CARDIOVASCULAR A:  Shock physiology has resolved patient is off of dopamine drip P:  Hemodynamics per ICU protocol -Stop fluids Stopped dopamine gtt. -Lovenox for anticoagulation   RENAL A:   Acute kidney injury likely ATN Cannot exclude early cardiorenal syndrome P:   Resolved DC nonessential nephrotoxic medications  GASTROINTESTINAL A:   Elevated LFTs P:   Trend LFTs  HEMATOLOGIC A:   Coagulopathy  secondary to COVID-19 infection and atrial fibrillation on chronic anticoagulation-D-dimer elevated as well as [name] of 1.9 P:  Lovenox for anticoagulation INFECTIOUS A:   COVID-19 infection Sepsis  P:   Procalcitonin trending up-less than 0.1 to 7.21 yestreday morning; continue to trend and adjust antibiotics appropriately May have secondary bacterial infection Pancultured: Sputum, blood, urine On cefepime and doxycycline Macrolides  not utilized due to increased QT  ENDOCRINE A:   Type 2 diabetes mellitus with hyperglycemia P:   Blood glucose monitoring with sliding scale insulin coverage  NEUROLOGIC A:   Acute encephalopathy secondary to hypoxemia, hypercapnia and COVID-19 infection History of CVA P:   RASS goal: 0 to -1 Fentanyl, Versed infusion (not tolerating propofol due to bradycardia hypotension) for vent sedation and discomfort Discontinue ketamine and propofol  continue to monitor mental status and wean down sedation as tolerated  Best Practice: Code Status: Full code Diet: NPO for now, initiate tube feeds as soon as feasible GI prophylaxis: Pepcid VTE prophylaxis: SCDs and already on heparin gtt    Critical care provider statement:    Critical care time (minutes):  33   Critical care time was exclusive of:  Separately billable procedures and  treating other patients   Critical care was necessary to treat or prevent imminent or  life-threatening deterioration of the following conditions:   COVID-19 infection, circulatory shock, acute kidney injury, multiple comorbid conditions   Critical care was time spent personally by me on the following  activities:  Development of treatment plan with patient or surrogate,  discussions with consultants, evaluation of patient's response to  treatment, examination of patient, obtaining history from patient or  surrogate, ordering and performing treatments and interventions, ordering  and review of laboratory studies and  re-evaluation of patient's condition   I assumed direction of critical care for this patient from another  provider in my specialty: no      Vida RiggerFuad Quaran Kedzierski, M.D.  Pulmonary & Critical Care Medicine  [facility]    *This note was dictated using voice recognition software/Dragon.  Despite best efforts to proofread, errors can occur which can change the meaning.  Any change was purely unintentional.

## 2019-04-02 NOTE — TOC Initial Note (Signed)
Transition of Care [facility]) - Initial/Assessment Note    Patient Details  Name: Desiree Mccall MRN: [id number] Date of Birth: [birth year]  [facility]) CM/SW Contact:    Trecia Rogers, [place] Phone Number: 04/02/2019, 11:57 AM  Clinical Narrative:                  Patient came to hospital from home. Patient lives at home in a single family home with her son. Pt reports that her son is not around much. Patient came to the hospital due to Sepsis with acute hypoxic respiratory failure without septic shock. Patient is COVID positive and doctor may transfer patient to either [facility] or home.  Patient does have a PCP doctor who is: Hattie Perch. [place], MD. Patient is able to obtain her medications. Her pharmacy is the [facility] on [address] in [place], [place]. Patient currently has the following DMEs: Cane, walker and a BPAP. Brad from [facility] was consulted to obtain a trilogy for the patient when discharged.  A PT consult is being put in by the doctor for a possible home health referral.   Expected Discharge Plan: Home/Self Care Barriers to Discharge: Continued Medical Work up   Patient Goals and CMS Choice Patient states their goals for this hospitalization and ongoing recovery are:: "to go home" CMS Medicare.gov Compare Post Acute Care list provided to:: Patient Choice offered to / list presented to : Patient  Expected Discharge Plan and Services Expected Discharge Plan: Home/Self Care In-house Referral: Clinical Social Work   Post Acute Care Choice: Durable Medical Equipment Living arrangements for the past 2 months: Single Family Home                 DME Arranged: Other see comment(Trilogy) DME Agency: [employer] Date DME Agency Contacted: 04/02/19 Time DME Agency Contacted: 33 Representative spoke with at DME Agency: [place] Arrangements/Services Living arrangements for the past 2 months: [place] with:: Adult  Children, Self Patient language and need for interpreter reviewed:: Yes Do you feel safe going back to the place where you live?: No   pt reports still feeling sick  Need for Family Participation in Patient Care: Yes (Comment)(pt's son)   Current home services: DME Criminal Activity/Legal Involvement Pertinent to Current Situation/Hospitalization: No - Comment as needed  Activities of Daily Living      Permission Sought/Granted                  Emotional Assessment Appearance:: Appears stated age Attitude/Demeanor/Rapport: Engaged, Gracious Affect (typically observed): Adaptable Orientation: : Oriented to Self, Oriented to Place, Oriented to  Time, Oriented to Situation   Psych Involvement: No (comment)  Admission diagnosis:  Acute respiratory failure, unspecified whether with hypoxia or hypercapnia (HCC) [J96.00] COVID-19 [U07.1] Sepsis with acute hypoxic respiratory failure without septic shock ([place]) [A41.9, R65.20, J96.01] Patient Active Problem List   Diagnosis Date Noted  . COVID-19   . Acute respiratory failure ([place])   . Sepsis with acute hypoxic respiratory failure without septic shock ([place]) 03/30/2019  . Fever of unknown origin 02/27/2019  . Chronic systolic CHF (congestive heart failure) ([place]) 05/28/2018  . Screening for malignant neoplasm of respiratory organ 07/19/2017  . H/O acute myocardial infarction 05/30/2017  . H/O: depression 05/30/2017  . Severe tobacco use disorder 05/30/2017  . Carotid stenosis, asymptomatic, left 05/04/2017  . OSA on CPAP 05/04/2017  . 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 Cat bite of forearm 11/22/2014  . Diabetes mellitus type 2, noninsulin dependent (HCC) 05/08/2014  . Syncope 04/10/2013  . Cardiac arrest (HCC) 04/10/2013  . Automatic implantable cardioverter-defibrillator in situ 07/13/2012  . Aortic dissection (HCC) 11/22/2011  . CAD (coronary artery disease) 11/22/2011  . Dyslipidemia 11/22/2011  . History of  stroke 11/22/2011  . Ischemic cardiomyopathy 11/22/2011  . Lumbar disc disease 11/22/2011  . PAD (peripheral artery disease) (HCC) 11/22/2011  . Pancreatitis due to common bile duct stone 11/22/2011   PCP:  Abram Sander, MD Pharmacy:   [facility] [address]), [place] - [address] [place]) [place] [address] Phone: [phone] Fax: [phone]     Social Determinants of Health (SDOH) Interventions    Readmission Risk Interventions Readmission Risk Prevention Plan 04/02/2019 03/03/2019  Transportation Screening Complete Complete  PCP or Specialist Appt within 3-5 Days - Complete  HRI or Home Care Consult - Complete  Social Work Consult for Recovery Care Planning/Counseling - Complete  Palliative Care Screening - Complete  Medication Review Oceanographer) Complete Complete  SW Recovery Care/Counseling Consult Complete -  Some recent data might be hidden

## 2019-04-02 NOTE — Plan of Care (Addendum)
Patient alert and oriented, on 2-3L [place] sats 97% as last documented. All medication given well tolerated. Will continue to monitor for remainder of shift.    Problem: Education: Goal: Knowledge of risk factors and measures for prevention of condition will improve Outcome: Progressing   Problem: Coping: Goal: Psychosocial and spiritual needs will be supported Outcome: Progressing   Problem: Respiratory: Goal: Will maintain a patent airway Outcome: Progressing Goal: Complications related to the disease process, condition or treatment will be avoided or minimized Outcome: Progressing

## 2019-04-02 NOTE — Progress Notes (Signed)
Report call ed to [facility]. Talked with Kristeen Miss RN. Patient walked to bedside commode with help. Sitting up in chair at present.

## 2019-04-02 NOTE — Progress Notes (Signed)
PT Cancellation Note  Patient Details Name: Desiree Mccall MRN: [id number] DOB: 23-Jan-1951   Cancelled Treatment:    Reason Eval/Treat Not Completed: (Consult received and chart reviewed.  Patient in process of transferring out to [facility].  Will hold on evaluation, but re-attempt next date if patient remains hospitalized.)   Treasure Ochs H. Owens Shark, PT, DPT, NCS 04/02/19, 2:59 PM [phone]

## 2019-04-02 NOTE — Progress Notes (Signed)
1455 Transferred to room 167 at [facility] via EMS.

## 2019-04-02 NOTE — Progress Notes (Signed)
Nutrition Follow-up  RD working remotely.  DOCUMENTATION CODES:   Obesity unspecified  INTERVENTION:  Provide Ensure Max Protein po TID, each supplement provides 150 kcal and 30 grams of protein.  Will change to regular MVI po daily now that patient is extubated.  Patient with high calorie/protein needs due to catabolic nature of [id number]-82. If she is unable to eat well consider liberalizing diet to regular.  NUTRITION DIAGNOSIS:   Increased nutrient needs related to catabolic [id number](BHALP-37 infection) as evidenced by estimated needs.  Ongoing.  GOAL:   Patient will meet greater than or equal to 90% of their needs  Progressing.  MONITOR:   PO intake, Supplement acceptance, Labs, Weight trends, I & O's  REASON FOR ASSESSMENT:   Ventilator, Consult (assessment and recommendations for tube feeding)  ASSESSMENT:   68 year old female with PMHx of DM, CHF, hx MI, hx aortic dissection s/p TEVAR, PAF, hx PEA arrest 06/2018, CAD, s/p AICD, hx CVA, HTN, HLD, OSA  Admitted with COVID-19 infection requiring intubation on 11/28, AKI.  Patient was extubated on 11/30. Diet was advanced to heart healthy today. Spoke with patient over the phone. She reports she was able to have a small amount to eat today. She tolerated part of the meal but had some emesis after [place]. She is amenable to drinking ONS to help meet elevated calorie/protein needs.  Medications reviewed and include: Decadron 6 mg Q24hrs IV, Novolog 0-9 units Q4hrs, Lantus 20 units daily, liquid MVI daily per tube, thiamine 100 mg daily, zinc sulfate 220 mg daily, cefepime, famotidine, remdesivir.  Labs reviewed: CBG 93-169, BUN 29.  Diet Order:   Diet Order            Diet Heart Room service appropriate? Yes; Fluid consistency: Thin  Diet effective now             EDUCATION NEEDS:   No education needs have been identified at this time  Skin:  Skin Assessment: Reviewed RN Assessment  Last BM:  03/30/2019 per  chart  Height:   Ht Readings from Last 1 Encounters:  03/30/19 5\' 4"  (1.626 m)   Weight:   Wt Readings from Last 1 Encounters:  04/02/19 103.6 kg   Ideal Body Weight:  54.5 kg  BMI:  Body mass index is 39.2 kg/m.  Estimated Nutritional Needs:   Kcal:  2050-2250  Protein:  105-135 grams  Fluid:  1.6 L/day  Jacklynn Barnacle, MS, RD, LDN Office: [phone] Pager: [phone] After Hours/Weekend Pager: [phone]

## 2019-04-02 NOTE — H&P (Signed)
TRH H&P   Patient Demographics:    Desiree Mccall, is a 68 y.o. female  MRN: [id number]   DOB - 11/04/1950  Admit Date - 04/02/2019  Outpatient Primary MD for the patient is Adamo, Jeris Penta, MD  Outpatient Specialists: [facility]  Patient coming from: [place] [facility]  CC - SOB    HPI:    Desiree Mccall  is a 68 y.o. 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, recent cardiac arrest in 2020 requiring endotracheal intubation this was a complication of a viral pneumonia according to the patient.  Patient with above history who was doing fairly well at home until Thanksgiving when she had a heavy meal subsequently started getting short of breath, in the next 24 hours she became extremely short of breath and called 911, she was taken to [facility] ER where she was found to be in severe respiratory distress and intubated.  Her lab work showed extremely high BNP, CRP of 1, Covid testing was positive.  She was admitted with running diagnosis of acute hypoxic respiratory failure with COVID-19 pneumonia.  She was successfully extubated within 48 hours and thereafter sent here on 2 L nasal cannula oxygen for further treatment to [facility].  Patient is  currently almost completely symptom-free.   Review of systems:     A full 10 point Review of Systems was done, except as stated above, all other Review of Systems were negative.   With Past History of the following :    Past Medical History:  Diagnosis Date  . Aortic dissection (HCC) 2010  . CHF (congestive heart failure) (HCC)   . Diabetes mellitus without complication (HCC)   . [name]   . Heart attack (HCC) sept. 25, 2010      Past Surgical History:  Procedure Laterality Date  . ABDOMINAL HYSTERECTOMY    . CARDIAC SURGERY    . CAROTID  STENT  2010  . CHOLECYSTECTOMY    . LEFT HEART CATH AND CORONARY ANGIOGRAPHY N/A 06/05/2018   Procedure: LEFT HEART CATH AND CORONARY ANGIOGRAPHY;  Surgeon: Laurier Nancy, MD;  Location: [facility] INVASIVE CV LAB;  Service: Cardiovascular;  Laterality: N/A;  . STENT PLACE LEFT URETER ([facility] HX)        Social History:     Social History   Tobacco Use  . Smoking status: Former Smoker    Packs/day: 0.50    Types: Cigarettes    Start date: 2012  . Smokeless tobacco: Never Used  Substance Use Topics  . Alcohol use: Yes    Comment: occasionally         Family History :     Family History  Problem Relation Age of Onset  . Heart failure Mother   . Heart failure Father        Home Medications:   Prior to Admission medications   Medication Sig Start Date End Date Taking? Authorizing Provider  acetaminophen (TYLENOL) 325 MG tablet Take 2 tablets (650 mg total) by mouth every 4 (four) hours as needed for mild pain (temp > 101.5). 06/05/18   Harlon Ditty D, NP  aspirin EC 81 MG tablet Take 81 mg by mouth daily. 10/29/18 10/29/19  [provider]  atorvastatin (LIPITOR) 40 MG tablet Take 1 tablet (40 mg total) by mouth daily at 6 PM. 06/06/18   Judithe Modest, NP  carvedilol (COREG) 6.25 MG tablet Take 1 tablet (6.25 mg total) by mouth 2 (two) times daily with a meal. Patient not taking: Reported on 02/27/2019  06/06/18   Judithe Modest, NP  FLUoxetine (PROZAC) 20 MG capsule Take 2 capsules by mouth daily.  03/12/15   [provider]  furosemide (LASIX) 20 MG tablet Take 20 mg by mouth daily. 12/06/18   [provider]  losartan (COZAAR) 50 MG tablet Take 100 mg by mouth daily. 01/02/19   [provider]  metoprolol succinate (TOPROL-XL) 100 MG 24 hr tablet Take 100 mg by mouth daily. 10/22/18   [provider]  spironolactone (ALDACTONE) 25 MG tablet Take 1 tablet (25 mg total) by mouth daily. Patient not taking: Reported on 02/27/2019 06/06/18   Judithe Modest, NP     Allergies:    [name]   Physical Exam:   Vitals  Blood pressure 138/84, pulse 74, temperature 98.4 F (36.9 C), temperature source Oral, resp. rate 16, SpO2 92 %.   1. General middle-aged obese Caucasian female lying in hospital bed in no distress,  2. Normal affect and insight, Not Suicidal or Homicidal, Awake Alert, Oriented X 3.  3. No F.N deficits, ALL C.Nerves Intact, Strength 5/5 all 4 extremities, Sensation intact all 4 extremities, Plantars down going.  4. Ears and Eyes appear Normal, Conjunctivae clear, PERRLA. Moist Oral Mucosa.  5. Supple Neck, + JVD, No cervical lymphadenopathy appriciated, No Carotid Bruits.  6. Symmetrical Chest wall movement, Good air movement bilaterally, +ve rales.  7. RRR, No Gallops, Rubs, loud systolic murmur per patient chronic, No Parasternal Heave.  8. Positive Bowel Sounds, Abdomen Soft, No tenderness, No organomegaly appriciated,No rebound -guarding or rigidity.  9.  No Cyanosis, Normal Skin Turgor, No Skin Rash or Bruise.  10. Good muscle tone,  joints appear normal , no effusions, Normal ROM.  11. No Palpable Lymph Nodes in Neck or Axillae      Data Review:    CBC Recent Labs  Lab 03/30/19  2124 03/31/19 0556 04/01/19 0559 04/02/19 0441  WBC 11.3* 11.0* 12.9* 8.5  HGB 11.0* 9.1* 9.0* 8.6*  HCT 35.4* 29.1* 29.4* 28.9*   PLT 291 201 207 194  MCV 81.8 81.3 82.6 84.8  MCH 25.4* 25.4* 25.3* 25.2*  MCHC 31.1 31.3 30.6 29.8*  RDW 16.2* 16.3* 16.6* 16.8*  LYMPHSABS 3.4  --   --  0.4*  MONOABS 0.5  --   --  0.2  EOSABS 0.1  --   --  0.0  BASOSABS 0.1  --   --  0.0   ------------------------------------------------------------------------------------------------------------------  Chemistries  Recent Labs  Lab 03/30/19 2124 03/31/19 0556 04/01/19 0559 04/02/19 0441  NA 137 140 140 142  K 4.5 4.1 3.5 4.4  CL 103 109 108 110  CO2 19* 22 24 25   GLUCOSE 377* 173* 180* 169*  BUN 19 21 33* 29*  CREATININE 1.22* 0.77 0.86 0.53  CALCIUM 8.8* 8.1* 8.1* 8.8*  MG  --   --  2.0 2.3  AST 112*  --   --   --   ALT 56*  --   --   --   ALKPHOS 203*  --   --   --   BILITOT 1.0  --   --   --    ------------------------------------------------------------------------------------------------------------------ estimated creatinine clearance is 81.1 mL/min (by C-G formula based on SCr of 0.53 mg/dL). ------------------------------------------------------------------------------------------------------------------ No results for input(s): TSH, T4TOTAL, T3FREE, THYROIDAB in the last 72 hours.  Invalid input(s): FREET3  Coagulation profile Recent Labs  Lab 03/30/19 2124  [name] 1.9*   ------------------------------------------------------------------------------------------------------------------- No results for input(s): DDIMER in the last 72 hours. -------------------------------------------------------------------------------------------------------------------  Cardiac Enzymes No results for input(s): CKMB, TROPONINI, MYOGLOBIN in the last 168 hours.  Invalid input(s): CK ------------------------------------------------------------------------------------------------------------------    Component Value Date/Time   BNP 172.0 (H) 04/01/2019 0559      ---------------------------------------------------------------------------------------------------------------  Urinalysis    Component Value Date/Time   COLORURINE YELLOW (A) 02/28/2019 2157   APPEARANCEUR HAZY (A) 02/28/2019 2157   LABSPEC 1.009 02/28/2019 2157   PHURINE 5.0 02/28/2019 2157   GLUCOSEU NEGATIVE 02/28/2019 2157   [place] NEGATIVE 02/28/2019 2157   [place] NEGATIVE 02/28/2019 2157   [place] 02/28/2019 2157   PROTEINUR 100 (A) 02/28/2019 2157   UROBILINOGEN 0.2 09/19/2009 1113   NITRITE NEGATIVE 02/28/2019 2157   LEUKOCYTESUR NEGATIVE 02/28/2019 2157    ----------------------------------------------------------------------------------------------------------------   Imaging Results:    Dg Chest Port 1 View  Result Date: 04/01/2019 CLINICAL DATA:  Acute respiratory failure. EXAM: PORTABLE CHEST 1 VIEW COMPARISON:  03/31/2019. FINDINGS: Endotracheal tube and NG tube in stable position. Cardiac pacer stable position. Aortic stent graft and vascular coils noted in stable position. Stable cardiomegaly. No pulmonary venous congestion. No focal infiltrate. Mild bibasilar subsegmental atelectasis again noted. No pleural effusion noted on today's exam. No pneumothorax. IMPRESSION: 1.  Endotracheal tube and NG tube in stable position. 2. Cardiac pacer in stable position. Aortic stent graft and vascular coils noted stable position. Stable cardiomegaly. No pulmonary venous congestion. 3. Mild bibasilar subsegmental atelectasis again noted. No pleural effusion noted on today's exam. Electronically Signed   By: Marcello Moores  Register   On: 04/01/2019 06:13    My personal review of EKG: Rhythm NSR, no Acute ST changes  TTE 01/2019   1. Left ventricular ejection fraction, by visual estimation, is 20 to 25%. The left ventricle has severely decreased function. Left ventricular septal wall thickness was normal. Normal left ventricular posterior wall thickness. There is no  left  ventricular hypertrophy.  2. Moderate to large anteroseptal, apical, anteroapical, anterior and lateral myocardial infarction.  3. Severely dilated left ventricular internal cavity size.  4. Global right ventricle has moderately reduced systolic function.The right ventricular size is severely enlarged. No increase in right ventricular wall thickness.  5. Left atrial size was mildly dilated.  6. Right atrial size was mildly dilated.  7. The mitral valve is abnormal. Mild mitral valve regurgitation.  8. The tricuspid valve is grossly normal. Tricuspid valve regurgitation mild-moderate.  9. The aortic valve is normal in structure. Aortic valve regurgitation is trivial. Mild to moderate aortic valve sclerosis/calcification without any evidence of aortic stenosis. 10. The pulmonic valve was grossly normal. Pulmonic valve regurgitation is not visualized. 11. Mildly elevated pulmonary artery systolic pressure. 12. Severe ant/Apical/Septal/Lateral Hypo. 13. Mod to severe LVE.   Assessment & Plan:    1.  Acute hypoxic respiratory failure requiring endotracheal intubation at [facility] ER 3 days ago, upon presentation CRP 1, BNP high, symptoms after having heavy Thanksgiving meal.  Day after intubation CRP up to 9 with fever and leukocytosis.  I think patient had decompensation and  acute on chronic combined systolic and diastolic heart failure with EF 25% due to having heavy Thanksgiving meal, she did have incidental Covid infection however her CRP was 1.  Once she was intubated next day her CRP went up to 10 with fever and mild leukocytosis pointing towards a ventilator associated infection or pneumonia.  She was successfully extubated within 48 hours after some antibiotics, steroids, remdesivir and diuretics.  This would be highly unusual for a COVID-19 pneumonia moreover her chest x-ray was unremarkable.   At this time patient will be treated for   A.  CHF acute on chronic combined systolic  and diastolic heart failure EF 25% - with Lasix, beta-blocker, Aldactone and ARB. Monitor BNP, electrolytes and weight.  Salt and fluid restriction, monitor closely.  Note patient has a AICD in place and most of her cardiology follow-up is at [facility].  B. Her COVID-19 infection which I think is incidental we will continue low-dose Decadron p.o. and finish her remdesivir course which has been started at [facility] [employer]  C. Elevated procalcitonin, CRP, fevers after 24 hours of intubation.  Likely ventilator or hospital-acquired infection.  She appears nontoxic, blood cultures are negative, she was given empiric antibiotics for 3 days and today she was on Rocephin only.  Will put her on oral doxycycline and monitor.  Will repeat chest x-ray here.    2.  History of TAVR procedure for aortic ulcer repair of thoracic endograft with aortic systolic murmur.  Supportive care thereafter follow at [facility].  3.  CAD, PAD, CVA history.  Continue dual antiplatelet therapy along with statin for secondary prevention.  4.  PAD.  As in #3 above.  5.  Paroxysmal atrial fibrillation.  [place] vas 2 score of 8.  Continue beta-blocker along with Xarelto.  6.  Essential hypertension.  Blood pressure is well controlled, placed on half home dose Coreg, ARB.  Monitor and adjust.  7.  Dyslipidemia.  On statin.  8.  Ongoing smoking.  Counseled to quit.  9.  OSA.  CPAP at home.  Oxygen here.  10.  Mild underlying COPD.  Supportive care.  11.  History of DM type II.  Insulin-dependent.  Home med Rx to be done, A1c stable, for now sliding scale along with low-dose Lantus.  Lab Results  Component Value Date   HGBA1C 6.8 (H) 02/27/2019  DVT Prophylaxis Xaralto  AM Labs Ordered, also please review Full Orders  Family Communication: Admission, patients condition and plan of care including tests being ordered have been discussed with the patient indicates understanding and agree with the plan and Code Status.  Code Status  Full  Likely DC to  Home 2-3 days  Condition GUARDED    Consults called: None    Admission status: Inpt    Time spent in minutes : 35   Susa RaringPrashant Joshuah Minella M.D on 04/02/2019 at 4:48 PM  To page go to [email or web address] - password [email or web address]

## 2019-04-03 DIAGNOSIS — J189 Pneumonia, unspecified organism: Secondary | ICD-10-CM

## 2019-04-03 DIAGNOSIS — J9601 Acute respiratory failure with hypoxia: Secondary | ICD-10-CM

## 2019-04-03 DIAGNOSIS — I2510 Atherosclerotic heart disease of native coronary artery without angina pectoris: Secondary | ICD-10-CM

## 2019-04-03 LAB — COMPREHENSIVE METABOLIC PANEL
ALT: 33 U/L (ref 0–44)
AST: 34 U/L (ref 15–41)
Albumin: 3.1 g/dL — ABNORMAL LOW (ref 3.5–5.0)
Alkaline Phosphatase: 109 U/L (ref 38–126)
Anion gap: 12 (ref 5–15)
BUN: 26 mg/dL — ABNORMAL HIGH (ref 8–23)
CO2: 26 mmol/L (ref 22–32)
Calcium: 9 mg/dL (ref 8.9–10.3)
Chloride: 105 mmol/L (ref 98–111)
Creatinine, Ser: 0.57 mg/dL (ref 0.44–1.00)
GFR calc Af Amer: >60 mL/min (ref >60)
GFR calc non Af Amer: >60 mL/min (ref >60)
Glucose, Bld: 131 mg/dL — ABNORMAL HIGH (ref 70–99)
Potassium: 4.2 mmol/L (ref 3.5–5.1)
Sodium: 143 mmol/L (ref 135–145)
Total Bilirubin: 0.8 mg/dL (ref 0.3–1.2)
Total Protein: 6 g/dL — ABNORMAL LOW (ref 6.5–8.1)

## 2019-04-03 LAB — CBC WITH DIFFERENTIAL/PLATELET
Abs Immature Granulocytes: 0.03 10*3/uL (ref 0.00–0.07)
Basophils Absolute: 0 10*3/uL (ref 0.0–0.1)
Basophils Relative: 0 %
Eosinophils Absolute: 0 10*3/uL (ref 0.0–0.5)
Eosinophils Relative: 0 %
HCT: 27.9 % — ABNORMAL LOW (ref 36.0–46.0)
Hemoglobin: 8.5 g/dL — ABNORMAL LOW (ref 12.0–15.0)
Immature Granulocytes: 0 %
Lymphocytes Relative: 11 %
Lymphs Abs: 0.8 10*3/uL (ref 0.7–4.0)
MCH: 25.8 pg — ABNORMAL LOW (ref 26.0–34.0)
MCHC: 30.5 g/dL (ref 30.0–36.0)
MCV: 84.5 fL (ref 80.0–100.0)
Monocytes Absolute: 0.4 10*3/uL (ref 0.1–1.0)
Monocytes Relative: 6 %
Neutro Abs: 6.1 10*3/uL (ref 1.7–7.7)
Neutrophils Relative %: 83 %
Platelets: 187 10*3/uL (ref 150–400)
RBC: 3.3 MIL/uL — ABNORMAL LOW (ref 3.87–5.11)
RDW: 17 % — ABNORMAL HIGH (ref 11.5–15.5)
WBC: 7.3 10*3/uL (ref 4.0–10.5)
nRBC: 0 % (ref 0.0–0.2)

## 2019-04-03 LAB — MAGNESIUM: Magnesium: 1.7 mg/dL (ref 1.7–2.4)

## 2019-04-03 LAB — GLUCOSE, CAPILLARY
Glucose-Capillary: 181 mg/dL — ABNORMAL HIGH (ref 70–99)
Glucose-Capillary: 152 mg/dL — ABNORMAL HIGH (ref 70–99)
Glucose-Capillary: 241 mg/dL — ABNORMAL HIGH (ref 70–99)
Glucose-Capillary: 173 mg/dL — ABNORMAL HIGH (ref 70–99)

## 2019-04-03 LAB — BRAIN NATRIURETIC PEPTIDE: B Natriuretic Peptide: 728.4 pg/mL — ABNORMAL HIGH (ref 0.0–100.0)

## 2019-04-03 LAB — D-DIMER, QUANTITATIVE: D-Dimer, Quant: 2.8 ug/mL-FEU — ABNORMAL HIGH (ref 0.00–0.50)

## 2019-04-03 LAB — ABO/RH: ABO/RH(D): B POS

## 2019-04-03 LAB — PROCALCITONIN: Procalcitonin: 1.83 ng/mL

## 2019-04-03 LAB — C-REACTIVE PROTEIN: CRP: 7.7 mg/dL — ABNORMAL HIGH (ref <1.0)

## 2019-04-03 MED ORDER — FUROSEMIDE 20 MG PO TABS
20.0000 mg | ORAL_TABLET | Freq: Every day | ORAL | Status: DC
  Administered 2019-04-04: 09:00:00 20 mg via ORAL
  Filled 2019-04-03: qty 1

## 2019-04-03 MED ORDER — CHLORHEXIDINE GLUCONATE CLOTH 2 % EX PADS
6.0000 | MEDICATED_PAD | Freq: Every day | CUTANEOUS | Status: DC
  Administered 2019-04-03 – 2019-04-07 (×5): 6 via TOPICAL

## 2019-04-03 MED ORDER — CARVEDILOL 12.5 MG PO TABS
12.5000 mg | ORAL_TABLET | Freq: Two times a day (BID) | ORAL | Status: DC
  Administered 2019-04-03 (×2): 12.5 mg via ORAL
  Filled 2019-04-03 (×2): qty 1

## 2019-04-03 MED ORDER — CARVEDILOL 12.5 MG PO TABS: 12.5000 mg | ORAL_TABLET | Freq: Two times a day (BID) | ORAL | Status: DC

## 2019-04-03 MED ORDER — FUROSEMIDE 10 MG/ML IJ SOLN
40.0000 mg | Freq: Once | INTRAMUSCULAR | Status: AC
  Administered 2019-04-03: 15:00:00 40 mg via INTRAVENOUS
  Filled 2019-04-03: qty 4

## 2019-04-03 NOTE — Progress Notes (Signed)
PROGRESS NOTE                                                                                                                                                                                                             Patient Demographics:    Desiree Mccall, is a 68 y.o. female, DOB - 06/27/1950, ZOX:[id number]  Outpatient Primary MD for the patient is Richarda Blade, Jeris Penta, MD   Admit date - 04/02/2019   LOS - 1  No chief complaint on file.      Brief Narrative: Patient is a 68 y.o. 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  Patient was treated with steroids, remdesivir, antibiotics, diuretics-extubated-and subsequently transferred to [facility].   Subjective:    Desiree Mccall today remains on just 1 L of oxygen-she appears remarkably well.  She denies any chest pain or shortness of breath.   Assessment  & Plan :   Acute Hypoxic Resp Failure due to decompensated heart failure, concurrent bacterial pneumonia and Covid 19 Viral pneumonia: Clinically improved-suspect main physiology behind hypoxia was decompensated systolic heart failure-currently well compensated.  She remains on empiric doxycycline and remdesivir/steroid for pneumonia.  Suspect could be weaned off oxygen soon, await recommendations from physical therapy.  Fever: afebrile  O2 requirements:  SpO2: 91 % O2 Flow Rate (L/min): 1 L/min   COVID-19 Labs: Recent Labs    03/31/19 1350  04/02/19 1615 04/03/19 0415  DDIMER  --  4.00* 2.80*  CRP 4.7* 10.7* 7.7*       Component Value Date/Time   BNP 728.4 (H) 04/03/2019 0415    Recent Labs  Lab 04/01/19 0559 04/02/19 0441 04/02/19 1615 04/03/19 0415  PROCALCITON 8.24 3.03 2.23 1.83    Lab Results  Component Value Date   SARSCOV2NAA NEGATIVE 02/27/2019     COVID-19 Medications: Steroids: 11/28>> Remdesivir: 11/29 Actemra: Not given Convalescent Plasma: Not given  Other medications: Diuretics:Euvolemic-Lasix 40 mg  IV x1 today, then plan to resume usual dosing of Lasix tomorrow. Antibiotics: See below  Prone/Incentive Spirometry: encouraged incentive spirometry use 3-4/hour.  DVT Prophylaxis  : Xarelto  Acute on chronic combined systolic and diastolic heart failure: Euvolemic-repeat 1 more dose of IV Lasix today-continue Coreg, Aldactone and ARB.  AICD in place-[facility]  PAF: Had a burst of narrow complex tachycardia earlier-increase Coreg to 12.5 mg twice daily-remains on Xarelto  CAD: No anginal symptoms-remains on dual antiplatelets, statin and Coreg.  Will need to clarify-if patient is on dual antiplatelets plus Xarelto.  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  Follow and adjust  CBG (last 3)  Recent Labs    04/02/19 2033 04/03/19 0912 04/03/19 1115  GLUCAP 210* 181* 152*    COPD: Continue inhalers-no evidence of flare  Tobacco abuse: Counseled  OSA: Resume CPAP on discharge-per facility policy-unable to use CPAP-use oxygen while here.  Obesity: Estimated body mass index is 39.2 kg/m as calculated from the following:    Height as of 03/30/19: 5\' 4"  (1.626 m).   Weight as of an earlier encounter on 04/02/19: 103.6 kg.    Consults  :  None  Procedures  :  None  ABG:    Component Value Date/Time   PHART 7.35 03/31/2019 2300   PCO2ART 45 03/31/2019 2300   PO2ART 103 03/31/2019 2300   HCO3 24.8 03/31/2019 2300   TCO2 25 09/19/2009 0932   ACIDBASEDEF 1.1 03/31/2019 2300   O2SAT 97.6 03/31/2019 2300    Vent Settings: N/A   Condition - Stable  Family Communication  :   Code Status :  Full Code  Diet :  Diet Order            Diet heart healthy/carb modified Room service appropriate? Yes; Fluid consistency: Thin; Fluid restriction: 1500 mL Fluid  Diet effective now               Disposition Plan  :  Remain hospitalized  Barriers to discharge: Hypoxia requiring O2 supplementation/complete 5 days of IV Remdesivir  Antimicorbials  :    Anti-infectives (From admission, onward)   Start     Dose/Rate Route Frequency Ordered Stop   04/03/19 1000  remdesivir 100 mg in sodium chloride 0.9 % 250 mL IVPB     100 mg 500 mL/hr over 30 Minutes Intravenous Daily 04/02/19 1703 04/05/19 0959   04/02/19 1800  doxycycline (VIBRA-TABS) tablet 100 mg     100 mg Oral Every 12 hours 04/02/19 1645        Inpatient Medications  Scheduled Meds: . aspirin EC  81 mg Oral Daily  . atorvastatin  40 mg Oral q1800  . carvedilol  12.5 mg Oral BID WC  . Chlorhexidine Gluconate Cloth  6 each Topical Daily  . clopidogrel  75 mg Oral Daily  . dexamethasone  4 mg Oral Daily  . doxycycline  100 mg Oral Q12H  . insulin aspart  0-5 Units Subcutaneous QHS  . insulin aspart  0-9 Units Subcutaneous TID WC  . insulin glargine  15 Units Subcutaneous BID  . losartan  25 mg Oral Daily  . pantoprazole  40 mg Oral Daily  .  rivaroxaban  20 mg Oral Q supper  . spironolactone  25 mg Oral Daily   Continuous Infusions: . remdesivir 100 mg in NS 250 mL 100 mg (04/03/19 0934)   PRN Meds:.acetaminophen, albuterol, bisacodyl,  ondansetron **OR** ondansetron (ZOFRAN) IV   Time Spent in minutes  25   Oren Binet M.D on 04/03/2019 at 12:10 PM  To page go to [email or web address] - use universal password  [facility] -  Office  [phone]    Objective:   Vitals:   04/02/19 1500 04/02/19 2035 04/03/19 0433 04/03/19 0906  BP: 138/84 (!) 140/51 (!) 156/70 (!) 146/60  Pulse: 74 74 66 87  Resp: 16 16 14 16   Temp: 98.4 F (36.9 C) 98 F (36.7 C) 98 F (36.7 C) 98.1 F (36.7 C)  TempSrc: Oral Oral Oral Oral  SpO2: 92% 96% 97% 91%    Wt Readings from Last 3 Encounters:  04/02/19 103.6 kg  03/03/19 100.8 kg  06/05/18 103.9 kg     Intake/Output Summary (Last 24 hours) at 04/03/2019 1210 Last data filed at 04/03/2019 0434 Gross per 24 hour  Intake 120 ml  Output 3550 ml  Net -3430 ml     Physical Exam Gen Exam:Alert awake-not in any distress HEENT:atraumatic, normocephalic Chest: B/L clear to auscultation anteriorly CVS:S1S2 regular Abdomen:soft non tender, non distended Extremities:no edema Neurology: Non focal Skin: no rash   Data Review:    CBC Recent Labs  Lab 03/30/19 2124 03/31/19 0556 04/01/19 0559 04/02/19 0441 04/02/19 1615 04/03/19 0415  WBC 11.3* 11.0* 12.9* 8.5 10.8* 7.3  HGB 11.0* 9.1* 9.0* 8.6* 8.6* 8.5*  HCT 35.4* 29.1* 29.4* 28.9* 28.7* 27.9*  PLT 291 201 207 194 218 187  MCV 81.8 81.3 82.6 84.8 85.4 84.5  MCH 25.4* 25.4* 25.3* 25.2* 25.6* 25.8*  MCHC 31.1 31.3 30.6 29.8* 30.0 30.5  RDW 16.2* 16.3* 16.6* 16.8* 17.1* 17.0*  LYMPHSABS 3.4  --   --  0.4*  --  0.8  MONOABS 0.5  --   --  0.2  --  0.4  EOSABS 0.1  --   --  0.0  --  0.0  BASOSABS 0.1  --   --  0.0  --  0.0    Chemistries  Recent Labs  Lab 03/30/19 2124 03/31/19 0556 04/01/19 0559 04/02/19 0441 04/02/19 1615 04/03/19 0415  NA 137 140 140 142  --  143  K 4.5 4.1 3.5 4.4  --  4.2  CL 103 109 108 110  --  105  CO2 19* 22 24 25   --  26  GLUCOSE 377* 173* 180* 169*  --  131*  BUN 19 21 33*  29*  --  26*  CREATININE 1.22* 0.77 0.86 0.53 0.51 0.57  CALCIUM 8.8* 8.1* 8.1* 8.8*  --  9.0  MG  --   --  2.0 2.3 1.9 1.7  AST 112*  --   --   --   --  34  ALT 56*  --   --   --   --  33  ALKPHOS 203*  --   --   --   --  109  BILITOT 1.0  --   --   --   --  0.8   ------------------------------------------------------------------------------------------------------------------ No results for input(s): CHOL, HDL, LDLCALC, TRIG, CHOLHDL, LDLDIRECT in the last 72 hours.  Lab Results  Component Value Date   HGBA1C 6.8 (H) 02/27/2019   ------------------------------------------------------------------------------------------------------------------ No results for input(s): TSH, T4TOTAL, T3FREE, THYROIDAB in  the last 72 hours.  Invalid input(s): FREET3 ------------------------------------------------------------------------------------------------------------------ No results for input(s): VITAMINB12, FOLATE, FERRITIN, TIBC, IRON, RETICCTPCT in the last 72 hours.  Coagulation profile Recent Labs  Lab 03/30/19 2124  [name] 1.9*    Recent Labs    04/02/19 1615 04/03/19 0415  DDIMER 4.00* 2.80*    Cardiac Enzymes No results for input(s): CKMB, TROPONINI, MYOGLOBIN in the last 168 hours.  Invalid input(s): CK ------------------------------------------------------------------------------------------------------------------    Component Value Date/Time   BNP 728.4 (H) 04/03/2019 0415    Micro Results Recent Results (from the past 240 hour(s))  Blood Culture (routine x 2)     Status: None (Preliminary result)   Collection Time: 03/30/19  9:35 PM   Specimen: BLOOD  Result Value Ref Range Status   Specimen Description BLOOD LEFT ANTECUBITAL  Final   Special Requests   Final    BOTTLES DRAWN AEROBIC AND ANAEROBIC Blood Culture adequate volume   Culture   Final    NO GROWTH 3 DAYS Performed at [facility], [address], [place], [place] [address]    Report Status  PENDING  Incomplete  Blood Culture (routine x 2)     Status: None (Preliminary result)   Collection Time: 03/30/19  9:37 PM   Specimen: BLOOD  Result Value Ref Range Status   Specimen Description BLOOD BLOOD LEFT ARM  Final   Special Requests   Final    BOTTLES DRAWN AEROBIC AND ANAEROBIC Blood Culture adequate volume   Culture  Setup Time   Final    GRAM POSITIVE COCCI IN BOTH AEROBIC AND ANAEROBIC BOTTLES CRITICAL RESULT CALLED TO, READ BACK BY AND VERIFIED WITH: Mila Merry 03/31/2019 AT 2039 HS Performed at [facility], [address] [place], [place] [address]    Culture GRAM POSITIVE COCCI  Final   Report Status PENDING  Incomplete  MRSA PCR Screening     Status: None   Collection Time: 03/31/19  4:02 AM   Specimen: Nasopharyngeal  Result Value Ref Range Status   MRSA by PCR NEGATIVE NEGATIVE Final    Comment:        The GeneXpert MRSA Assay (FDA approved for NASAL specimens only), is one component of a comprehensive MRSA colonization surveillance program. It is not intended to diagnose MRSA infection nor to guide or monitor treatment for MRSA infections. Performed at [facility], [address] [place], [place] [address]   Culture, respiratory (non-expectorated)     Status: None   Collection Time: 03/31/19 12:28 PM   Specimen: Tracheal Aspirate; Respiratory  Result Value Ref Range Status   Specimen Description   Final    TRACHEAL ASPIRATE Performed at [facility], [address] [place], [place] [address]    Special Requests   Final    Normal Performed at [facility], [address] [place], [place] [address]    Gram Stain   Final    FEW WBC PRESENT, PREDOMINANTLY PMN RARE GRAM POSITIVE COCCI IN PAIRS RARE GRAM POSITIVE RODS    Culture   Final    Consistent with normal respiratory flora. Performed at [facility], [address] [place], [place] [address]    Report Status 04/02/2019 FINAL  Final   Urine culture     Status: None   Collection Time: 04/01/19  2:23 AM   Specimen: In/Out Cath Urine  Result Value Ref Range Status   Specimen Description   Final    IN/OUT CATH URINE Performed  at [facility], [address], [place], [place] [address]    Special Requests   Final    NONE Performed at [facility], [address] [place], [place] [address]    Culture   Final    NO GROWTH Performed at [facility], [address], [place], [place] [address]    Report Status 04/02/2019 FINAL  Final    Radiology Reports Dg Chest Port 1 View  Result Date: 04/02/2019 CLINICAL DATA:  Shortness of breath. EXAM: PORTABLE CHEST 1 VIEW COMPARISON:  Radiograph yesterday. CT 02/27/2019 FINDINGS: Endotracheal and enteric tubes have been removed. Improved lung volumes. Left-sided pacemaker remains in place. Post aortic stent graft repair, unchanged in radiographic appearance. Unchanged cardiomegaly. Unchanged mediastinal contours. Mild vascular congestion without pulmonary edema. Minimal blunting of left costophrenic angle suggesting small effusion. Streaky bibasilar atelectasis. No pneumothorax. IMPRESSION: 1. Increasing vascular congestion, stable cardiomegaly. 2. Improved lung volumes after extubation. Streaky bibasilar atelectasis. 3. Possible small left pleural effusion, increased. Electronically Signed   By: Narda Rutherford M.D.   On: 04/02/2019 18:21   Dg Chest Port 1 View  Result Date: 04/01/2019 CLINICAL DATA:  Acute respiratory failure. EXAM: PORTABLE CHEST 1 VIEW COMPARISON:  03/31/2019. FINDINGS: Endotracheal tube and NG tube in stable position. Cardiac pacer stable position. Aortic stent graft and vascular coils noted in stable position. Stable cardiomegaly. No pulmonary venous congestion. No focal infiltrate. Mild bibasilar subsegmental atelectasis again noted. No pleural effusion noted on today's exam. No pneumothorax. IMPRESSION: 1.  Endotracheal tube and NG  tube in stable position. 2. Cardiac pacer in stable position. Aortic stent graft and vascular coils noted stable position. Stable cardiomegaly. No pulmonary venous congestion. 3. Mild bibasilar subsegmental atelectasis again noted. No pleural effusion noted on today's exam. Electronically Signed   By: Maisie Fus  Register   On: 04/01/2019 06:13   Dg Chest Port 1 View  Result Date: 03/31/2019 CLINICAL DATA:  Shortness of breath. EXAM: PORTABLE CHEST 1 VIEW COMPARISON:  Chest x-rays dated 03/30/2019 and 02/28/2019. FINDINGS: Improved aeration within the upper lobes bilaterally suggesting improved fluid status. Probable bibasilar atelectasis and/or small pleural effusions. Stable cardiomegaly. Endotracheal tube appears adequately positioned with tip at the level of the clavicles. Enteric tube passes below the diaphragm. Aortic stent appears stable in configuration. LEFT chest wall pacemaker/ICD apparatus appears stable. IMPRESSION: 1. Improved aeration within the upper lobes bilaterally suggesting improved fluid status. 2. Probable bibasilar atelectasis and/or small pleural effusions. 3. Stable cardiomegaly. Electronically Signed   By: Bary Richard M.D.   On: 03/31/2019 10:58   Dg Chest Portable 1 View  Result Date: 03/30/2019 CLINICAL DATA:  Shortness of breath, respiratory failure EXAM: PORTABLE CHEST 1 VIEW COMPARISON:  02/28/2019 FINDINGS: Left AICD remains in place, unchanged. The endotracheal tube is 6 cm above the carina. NG tube enters the stomach. Prior aortic stent graft. Airspace disease bilaterally, most pronounced in the upper lobes and perihilar regions. Given the upper lobe predominance, favor infection over edema. No visible effusions. No acute bony abnormality. IMPRESSION: Cardiomegaly, vascular congestion. Bilateral upper lobe and perihilar opacities concerning for pneumonia. Electronically Signed   By: Charlett Nose M.D.   On: 03/30/2019 21:39

## 2019-04-03 NOTE — Plan of Care (Addendum)
Patient up to chair, currently on room air oxygen saturation 97%. Pulled Foley cath, well tolerated. All medication given well tolerated. Will continue to monitor for remainder of shift.   Problem: Education: Goal: Knowledge of risk factors and measures for prevention of condition will improve 04/03/2019 1308 by Orvan Falconer, RN Outcome: Progressing 04/03/2019 1307 by Orvan Falconer, RN Outcome: Progressing 04/03/2019 1146 by Orvan Falconer, RN Outcome: Progressing   Problem: Coping: Goal: Psychosocial and spiritual needs will be supported 04/03/2019 1308 by Orvan Falconer, RN Outcome: Progressing 04/03/2019 1307 by Orvan Falconer, RN Outcome: Progressing 04/03/2019 1146 by Orvan Falconer, RN Outcome: Progressing   Problem: Respiratory: Goal: Will maintain a patent airway 04/03/2019 1308 by Orvan Falconer, RN Outcome: Progressing 04/03/2019 1307 by Orvan Falconer, RN Outcome: Progressing 04/03/2019 1146 by Orvan Falconer, RN Outcome: Progressing Goal: Complications related to the disease process, condition or treatment will be avoided or minimized 04/03/2019 1308 by Orvan Falconer, RN Outcome: Progressing 04/03/2019 1307 by Orvan Falconer, RN Outcome: Progressing 04/03/2019 1146 by Orvan Falconer, RN Outcome: Progressing

## 2019-04-03 NOTE — Evaluation (Signed)
Physical Therapy Evaluation Patient Details Name: Desiree Mccall MRN: [id number] DOB: Jul 19, 1950 Today's Date: 04/03/2019   History of Present Illness  68 y/o female with hx of recent cardiac arrest and endotracheal intubation, CAD s/p PCI on antiplatelet therapy and statin, gall bladder disease, IDDM, cardiomyopathy with chronic combined dystsolic and diastolic CHF EF 24%, aortic disection. L stent in ureter, L heart cath and angiography, chole, carotid stent. OSA on CPAP. Prestented to ER after thanksgiving w/ SOB was found to have severe respiratory distress and was intubated, COVID testing came back positive. extubated with 48hrs of admisison then sent to [facility].  Clinical Impression   Pt admitted with above diagnosis. PTA living home with family, states was quite independent at AD level at times when she felt she was weak. She states that she has a hx of hypoxia but did not have a pulse ox at home. Pt currently with functional limitations due to the deficits listed below (see PT Problem List). Pt is needing min- min guard assist with functional mobility this am, she is presenting with increased weakness, decreased independence and activity tolerance. She was able to ambulate approx 158ft with HHA and min guard assist, ambulates very slowly and cautiously. Pt was on room air and noted to sats in low 80s-90s throughout session, other than increased WOB did not seem to be in any other distress and recovered quickly. Pt hs been initiated on flutter valve and incentive spirometer use.  Pt will benefit from skilled PT to increase her overall strength, balance and coordination, activity tolerance, independence and safety with mobility to allow discharge to the venue listed below.       Follow Up Recommendations Home health PT    Equipment Recommendations  None recommended by PT    Recommendations for Other Services       Precautions / Restrictions Precautions Precautions: Fall Precaution Comments:  monitor O2 sats Restrictions Weight Bearing Restrictions: No      Mobility  Bed Mobility               General bed mobility comments: pt in receliner at therapist arrival  Transfers Overall transfer level: Needs assistance Equipment used: None Transfers: Sit to/from Stand Sit to Stand: Min guard         General transfer comment: close minguard for safety and balance, increased time/effort but no assist required  Ambulation/Gait Ambulation/Gait assistance: Min guard Gait Distance (Feet): 120 Feet Assistive device: 1 person hand held assist Gait Pattern/deviations: Step-through pattern     General Gait Details: very slow and cautious sadence  Stairs            Wheelchair Mobility    Modified Rankin (Stroke Patients Only)       Balance Overall balance assessment: Needs assistance Sitting-balance support: Feet supported Sitting balance-Leahy Scale: Good     Standing balance support: Single extremity supported;During functional activity Standing balance-Leahy Scale: Fair Standing balance comment: minimal LOBs noted but able to correct with min guard assist and HH                             Pertinent Vitals/Pain Pain Assessment: No/denies pain    Home Living Family/patient expects to be discharged to:: Private residence Living Arrangements: Children Available Help at Discharge: Family;Available PRN/intermittently Type of Home: House Home Access: Stairs to enter Entrance Stairs-Rails: None Entrance Stairs-Number of Steps: 2 Home Layout: One level Home Equipment: Cane - single point;Walker -  2 wheels Additional Comments: Reports she ambulated ind without AD prior, drives, runs errands    Prior Function Level of Independence: Independent               Hand Dominance   Dominant Hand: Right    Extremity/Trunk Assessment   Upper Extremity Assessment Upper Extremity Assessment: Defer to OT evaluation    Lower Extremity  Assessment Lower Extremity Assessment: Generalized weakness    Cervical / Trunk Assessment Cervical / Trunk Assessment: Normal  Communication   Communication: No difficulties  Cognition Arousal/Alertness: Awake/alert Behavior During Therapy: WFL for tasks assessed/performed Overall Cognitive Status: Within Functional Limits for tasks assessed                                        General Comments General comments (skin integrity, edema, etc.): reinforced use od both incentive spirometer and also flutter valve    Exercises     Assessment/Plan    PT Assessment Patient needs continued PT services  PT Problem List Decreased strength;Decreased mobility;Decreased coordination;Decreased activity tolerance;Decreased balance;Decreased safety awareness       PT Treatment Interventions Gait training;Functional mobility training;Neuromuscular re-education;Balance training;Therapeutic exercise;Therapeutic activities;Patient/family education    PT Goals (Current goals can be found in the Care Plan section)  Acute Rehab PT Goals Patient Stated Goal: return home PT Goal Formulation: With patient Time For Goal Achievement: 04/17/19 Potential to Achieve Goals: Good    Frequency Min 3X/week   Barriers to discharge   alone at home for increased lengths of time    Co-evaluation               AM-PAC PT "6 Clicks" Mobility  Outcome Measure Help needed turning from your back to your side while in a flat bed without using bedrails?: A Little Help needed moving from lying on your back to sitting on the side of a flat bed without using bedrails?: A Little Help needed moving to and from a bed to a chair (including a wheelchair)?: A Little Help needed standing up from a chair using your arms (e.g., wheelchair or bedside chair)?: A Little Help needed to walk in hospital room?: A Little Help needed climbing 3-5 steps with a railing? : A Lot 6 Click Score: 17    End of  Session   Activity Tolerance: Patient tolerated treatment well Patient left: in chair;with call bell/phone within reach Nurse Communication: Mobility status PT Visit Diagnosis: Unsteadiness on feet (R26.81);Muscle weakness (generalized) (M62.81)    Time: 2426-8341 PT Time Calculation (min) (ACUTE ONLY): 24 min   Charges:   PT Evaluation $PT Eval Moderate Complexity: 1 Mod PT Treatments $Gait Training: 8-22 mins        Drema Pry, PT   Freddi Starr 04/03/2019, 1:18 PM

## 2019-04-03 NOTE — Discharge Summary (Signed)
PULMONARY / CRITICAL CARE MEDICINE  Name: Desiree Mccall MRN: [id number] DOB: Dec 26, 1950    LOS: 3   Reason for Admission: Acute hypoxic respiratory failure  Brief patient description: 68 year old female with an extensive medical history, admitted with acute hypoxic respiratory failure requiring emergent intubation and COVID-19 pneumonia  HPI: This is a 68 year old Caucasian female with a significant cardiac history that includes CAD (2010 Anter STEMI and LAD PCI, 06/2018 70% pRCA stenosis), PEA Arrest 05/2018, ICM (EF 43% TEE 10/2018, ICD 2011), PAF (dx 06/2018), hx aortic dissection (2010 Endovascular repair w/ L. 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  History is obtained from EMS and ED records as patient is currently intubated and sedated.  Per EMS, patient was severely tachypneic, diaphoretic, pale with SPO2 that was unreadable on room air.  She was placed on CPAP at 100% FiO2.  However upon arrival in the ED, patient was still pale, diaphoretic and severely tachypneic and hypoxic hence she was emergently intubated.  Her ED work-up was significant for hyperglycemia with a blood glucose of 377 mg/dL, elevated LFTs, lactic acid of 5.7, WBC of 11.3K, hemoglobin of 11.0, hematocrit of 35.4, fibrinogen 499, D-dimer 5178, creatinine of 1.22, GFR of 46, and the [name] 1.9.  Her EKG was unremarkable except for a QTC of 520.  COVID-19 antigen test was positive.  She is being admitted to the ICU for further management.    11/30 -patient extubated on 4-6L/min [place] 12/1 -patient improved, reports nausea and severe physical weakness unable to take care of self  Past Medical History:   Diagnosis Date  . Aortic dissection (HCC) 2010  . CHF (congestive heart failure) (HCC)   . Diabetes mellitus without complication (HCC)   . [name]   . Heart attack (HCC) sept. 25, 2010   Past Surgical History:  Procedure Laterality Date  . ABDOMINAL HYSTERECTOMY    . CARDIAC SURGERY    . CAROTID STENT  2010  . CHOLECYSTECTOMY    . LEFT HEART CATH AND CORONARY ANGIOGRAPHY N/A 06/05/2018   Procedure: LEFT HEART CATH AND CORONARY ANGIOGRAPHY;  Surgeon: Laurier Nancy, MD;  Location: [facility] INVASIVE CV LAB;  Service: Cardiovascular;  Laterality: N/A;  . STENT PLACE LEFT URETER ([facility] HX)     No current facility-administered medications on file prior to encounter.    Current Outpatient Medications on File Prior to Encounter  Medication Sig  . aspirin EC 81 MG tablet Take 81 mg by mouth daily.  Marland Kitchen atorvastatin (LIPITOR) 40 MG tablet Take 1 tablet (40 mg total) by mouth daily at 6 PM.  . FLUoxetine (PROZAC) 20 MG capsule Take 2 capsules by mouth daily.   . furosemide (LASIX) 20 MG tablet Take 20 mg by mouth daily.  Marland Kitchen losartan (COZAAR) 50 MG tablet Take 100 mg by mouth daily.  . metoprolol succinate (TOPROL-XL) 100 MG 24 hr tablet Take 100 mg by mouth daily.  Marland Kitchen acetaminophen (TYLENOL) 325 MG tablet Take 2 tablets (650 mg total) by mouth every 4 (four) hours as needed for mild pain (temp > 101.5).  . carvedilol (COREG) 6.25 MG tablet Take 1 tablet (6.25 mg total) by mouth 2 (two) times daily with a meal. (Patient not taking: Reported on 02/27/2019)  .  spironolactone (ALDACTONE) 25 MG tablet Take 1 tablet (25 mg total) by mouth daily. (Patient not taking: Reported on 02/27/2019)    Allergies [name]  Family History Family History  Problem Relation Age of Onset  . Heart failure Mother   . Heart failure Father    Social History  reports that she has quit smoking. Her smoking use included cigarettes. She started smoking about 8 years ago. She smoked 0.50 packs per  day. She has never used smokeless tobacco. She reports current alcohol use. She reports that she does not use drugs.*Per my office records:The patient has smoked half to 1 pack/day since age 5.  She had a few years of a hiatus but for the most part had been consistently smoking until very recently.   Review Of Systems: Unable to obtain as patient is intubated and sedated  VITAL SIGNS: BP 138/64   Pulse 74   Temp 99 F (37.2 C) (Bladder)   Resp 16   Ht 5\' 4"  (1.626 m)   Wt 103.6 kg   SpO2 99%   BMI 39.20 kg/m      INTAKE / OUTPUT: I/O last 3 completed shifts: In: -  Out: 550 [Urine:550]  PHYSICAL EXAMINATION: Exam was limited due to required PPE in the setting of COVID-19 pandemic General: Critically ill-appearing obese woman, intubated, mechanically ventilated, sedated.  She is pale. HEENT: Orotracheally intubated, OG in place Neuro: Sedated, cannot assess any further. Cardiovascular: Bradycardic, regular rate, cannot appreciate murmurs due to lung sounds Lungs: Rales and rhonchi throughout, no wheezes. Abdomen: Obese, soft, normoactive bowel sounds  Musculoskeletal: No joint effusions or deformity, no cyanosis or clubbing. Skin: No petechia or rashes, warm and dry.  LABS:  BMET Recent Labs  Lab 04/01/19 0559 04/02/19 0441 04/02/19 1615 04/03/19 0415  NA 140 142  --  143  K 3.5 4.4  --  4.2  CL 108 110  --  105  CO2 24 25  --  26  BUN 33* 29*  --  26*  CREATININE 0.86 0.53 0.51 0.57  GLUCOSE 180* 169*  --  131*    Electrolytes Recent Labs  Lab 04/01/19 0559 04/02/19 0441 04/02/19 1615 04/03/19 0415  CALCIUM 8.1* 8.8*  --  9.0  MG 2.0 2.3 1.9 1.7  PHOS 4.5  --   --   --     CBC Recent Labs  Lab 04/02/19 0441 04/02/19 1615 04/03/19 0415  WBC 8.5 10.8* 7.3  HGB 8.6* 8.6* 8.5*  HCT 28.9* 28.7* 27.9*  PLT 194 218 187    Coag's Recent Labs  Lab 03/30/19 2124 03/31/19 1717 04/01/19 0114  APTT 28 44* 62*  [name] 1.9*  --   --     Sepsis  Markers Recent Labs  Lab 03/30/19 2134  03/30/19 2317  03/31/19 0825  04/02/19 0441 04/02/19 1615 04/03/19 0415  LATICACIDVEN 5.7*  --  2.2*  --  0.8  --   --   --   --   PROCALCITON  --    < >  --    < >  --    < > 3.03 2.23 1.83   < > = values in this interval not displayed.    ABG Recent Labs  Lab 03/31/19 1200 03/31/19 1432 03/31/19 2300  PHART 7.19* 7.29* 7.35  PCO2ART 67* 51* 45  PO2ART 81* 77* 103    Liver Enzymes Recent Labs  Lab 03/30/19 2124 04/01/19 0559 04/03/19 0415  AST 112*  --  34  ALT 56*  --  33  ALKPHOS 203*  --  109  BILITOT 1.0  --  0.8  ALBUMIN 3.9 3.4* 3.1*    Cardiac Enzymes No results for input(s): TROPONINI, PROBNP in the last 168 hours.  Glucose Recent Labs  Lab 04/02/19 0439 04/02/19 1156 04/02/19 1632 04/02/19 2033 04/03/19 0912 04/03/19 1115  GLUCAP 145* 165* 87 210* 181* 152*    Imaging Dg Chest Port 1 View  Result Date: 04/02/2019 CLINICAL DATA:  Shortness of breath. EXAM: PORTABLE CHEST 1 VIEW COMPARISON:  Radiograph yesterday. CT 02/27/2019 FINDINGS: Endotracheal and enteric tubes have been removed. Improved lung volumes. Left-sided pacemaker remains in place. Post aortic stent graft repair, unchanged in radiographic appearance. Unchanged cardiomegaly. Unchanged mediastinal contours. Mild vascular congestion without pulmonary edema. Minimal blunting of left costophrenic angle suggesting small effusion. Streaky bibasilar atelectasis. No pneumothorax. IMPRESSION: 1. Increasing vascular congestion, stable cardiomegaly. 2. Improved lung volumes after extubation. Streaky bibasilar atelectasis. 3. Possible small left pleural effusion, increased. Electronically Signed   By: Narda RutherfordMelanie  Sanford M.D.   On: 04/02/2019 18:21    STUDIES:  None  CULTURES: Blood cultures x2  ANTIBIOTICS: Doxycycline 03/31/2019> Cefepime 03/30/2019>  LINES/TUBES: Right femoral triple-lumen catheter (ED) Peripheral IVs Foley catheter, necessary  due to need for accurate I&O in the setting of critical illness.  DISCUSSION: 68 year old female presenting with acute hypoxic and hypercarbic respiratory failure secondary to COVID-19 pneumonia, septic shock, hyperglycemia, elevated troponin, and AKI requiring emergent intubation  ASSESSMENT / PLAN:  PULMONARY A: Acute hypoxic and hypercarbic respiratory failure COVID-19 pneumonia Lactic acidosis P:   -Patient is status post spontaneous breathing trial -Successfully liberated from mechanical ventilation -Plan to continue remdesivir as per protocol -Currently on 4 L/min nasal cannula   CARDIOVASCULAR A:  Shock physiology has resolved patient is off of dopamine drip P:  Hemodynamics per ICU protocol -Stop fluids Stopped dopamine gtt. -Lovenox for anticoagulation   RENAL A:   Acute kidney injury likely ATN Cannot exclude early cardiorenal syndrome P:   Resolved DC nonessential nephrotoxic medications  GASTROINTESTINAL A:   Elevated LFTs P:   Trend LFTs  HEMATOLOGIC A:   Coagulopathy secondary to COVID-19 infection and atrial fibrillation on chronic anticoagulation-D-dimer elevated as well as [name] of 1.9 P:  Lovenox for anticoagulation INFECTIOUS A:   COVID-19 infection Sepsis  P:   Procalcitonin trending up-less than 0.1 to 7.21 yestreday morning; continue to trend and adjust antibiotics appropriately May have secondary bacterial infection Pancultured: Sputum, blood, urine On cefepime and doxycycline Macrolides not utilized due to increased QT  ENDOCRINE A:   Type 2 diabetes mellitus with hyperglycemia P:   Blood glucose monitoring with sliding scale insulin coverage  NEUROLOGIC A:   Acute encephalopathy secondary to hypoxemia, hypercapnia and COVID-19 infection History of CVA P:   RASS goal: 0 to -1 Fentanyl, Versed infusion (not tolerating propofol due to bradycardia hypotension) for vent sedation and discomfort Discontinue ketamine and propofol   continue to monitor mental status and wean down sedation as tolerated  Best Practice: Code Status: Full code Diet: NPO for now, initiate tube feeds as soon as feasible GI prophylaxis: Pepcid VTE prophylaxis: SCDs and already on heparin gtt    Critical care provider statement:    Critical care time (minutes):  33   Critical care time was exclusive of:  Separately billable procedures and  treating other patients   Critical care was necessary to treat or prevent imminent or  life-threatening deterioration of the following conditions:  COVID-19 infection, circulatory shock, acute kidney injury, multiple comorbid conditions   Critical care was time spent personally by me on the following  activities:  Development of treatment plan with patient or surrogate,  discussions with consultants, evaluation of patient's response to  treatment, examination of patient, obtaining history from patient or  surrogate, ordering and performing treatments and interventions, ordering  and review of laboratory studies and re-evaluation of patient's condition   I assumed direction of critical care for this patient from another  provider in my specialty: no      Vida Rigger, M.D.  Pulmonary & Critical Care Medicine  [facility]    *This note was dictated using voice recognition software/Dragon.  Despite best efforts to proofread, errors can occur which can change the meaning.  Any change was purely unintentional.

## 2019-04-03 NOTE — Evaluation (Addendum)
Occupational Therapy Evaluation Patient Details Name: Desiree Mccall MRN: [id number] DOB: 06/29/1950 Today's Date: 04/03/2019    History of Present Illness 68 y/o female with hx of recent cardiac arrest and endotracheal intubation, CAD s/p PCI on antiplatelet therapy and statin, gall bladder disease, IDDM, cardiomyopathy with chronic combined dystsolic and diastolic CHF EF 45%, aortic disection. L stent in ureter, L heart cath and angiography, chole, carotid stent. OSA on CPAP. Prestented to ER after thanksgiving w/ SOB was found to have severe respiratory distress and was intubated, COVID testing came back positive. extubated with 48hrs of admisison then sent to [facility].   Clinical Impression   This 68 y/o female presents with the above. PTA pt reports independence with ADL, iADL and functional mobility, reports she lives with her son but reports he is often working. Pt performing room level mobility this session without AD and overall minA-close minguard assist. She currently requires minA for LB ADL, minguard assist for standing grooming ADL. Pt on RA throughout session with SpO2 >/=87% throughout, max HR 105. She will benefit from continued acute OT services and recommend follow up [facility] services after discharge to progress pt towards her PLOF. Will follow.     Follow Up Recommendations  Home health OT;Supervision - Intermittent    Equipment Recommendations  3 in 1 bedside commode(for use in shower)           Precautions / Restrictions Precautions Precautions: Fall Precaution Comments: monitor O2 sats Restrictions Weight Bearing Restrictions: No      Mobility Bed Mobility               General bed mobility comments: OOB in recliner upon arrival  Transfers Overall transfer level: Needs assistance Equipment used: None Transfers: Sit to/from Stand Sit to Stand: Min guard         General transfer comment: close minguard for safety and balance, increased time/effort but no  assist required    Balance Overall balance assessment: Needs assistance Sitting-balance support: Feet supported Sitting balance-Leahy Scale: Good     Standing balance support: Single extremity supported;No upper extremity supported;During functional activity Standing balance-Leahy Scale: Fair Standing balance comment: mildly unsteady and guarded with mobility but no overt LOB                           ADL either performed or assessed with clinical judgement   ADL Overall ADL's : Needs assistance/impaired Eating/Feeding: Modified independent;Sitting   Grooming: Oral care;Min guard;Standing   Upper Body Bathing: Set up;Sitting   Lower Body Bathing: Minimal assistance;Sit to/from stand   Upper Body Dressing : Set up;Sitting   Lower Body Dressing: Minimal assistance;Sit to/from stand   Toilet Transfer: Minimal assistance;Ambulation Toilet Transfer Details (indicate cue type and reason): simulated via transfer to/from recliner; room level mobility Toileting- Clothing Manipulation and Hygiene: Minimal assistance;Sit to/from stand       Functional mobility during ADLs: Minimal assistance;Min guard       Vision         Perception     Praxis      Pertinent Vitals/Pain Pain Assessment: No/denies pain     Hand Dominance Right   Extremity/Trunk Assessment Upper Extremity Assessment Upper Extremity Assessment: Overall WFL for tasks assessed   Lower Extremity Assessment Lower Extremity Assessment: Defer to PT evaluation   Cervical / Trunk Assessment Cervical / Trunk Assessment: Normal   Communication Communication Communication: No difficulties   Cognition Arousal/Alertness: Awake/alert Behavior During Therapy:  WFL for tasks assessed/performed Overall Cognitive Status: Within Functional Limits for tasks assessed                                     General Comments       Exercises     Shoulder Instructions      Home Living  Family/patient expects to be discharged to:: Private residence Living Arrangements: Children Available Help at Discharge: Family;Available PRN/intermittently Type of Home: House Home Access: Stairs to enter [employer] of Steps: 2 Entrance Stairs-Rails: None Home Layout: One level     Bathroom Shower/Tub: Teacher, early years/pre: Handicapped height     Home Equipment: Cane - single point;Walker - 2 wheels          Prior Functioning/Environment Level of Independence: Independent        Comments: drive, performing iADL around the home        OT Problem List: Decreased strength;Decreased activity tolerance;Impaired balance (sitting and/or standing);Cardiopulmonary status limiting activity      OT Treatment/Interventions: Therapeutic exercise;Self-care/ADL training;Energy conservation;DME and/or AE instruction;Therapeutic activities;Patient/family education;Balance training    OT Goals(Current goals can be found in the care plan section) Acute Rehab OT Goals Patient Stated Goal: return home OT Goal Formulation: With patient Time For Goal Achievement: 04/17/19 Potential to Achieve Goals: Good  OT Frequency: Min 2X/week   Barriers to D/C:            Co-evaluation              AM-PAC OT "6 Clicks" Daily Activity     Outcome Measure Help from another person eating meals?: None Help from another person taking care of personal grooming?: A Little Help from another person toileting, which includes using toliet, bedpan, or urinal?: A Little Help from another person bathing (including washing, rinsing, drying)?: A Little Help from another person to put on and taking off regular upper body clothing?: None Help from another person to put on and taking off regular lower body clothing?: A Little 6 Click Score: 20   End of Session Nurse Communication: Mobility status  Activity Tolerance: Patient tolerated treatment well Patient left: in chair;with  call bell/phone within reach;Other (comment)(PT present to continue session)  OT Visit Diagnosis: Unsteadiness on feet (R26.81);Muscle weakness (generalized) (M62.81)                Time: 0177-9390 OT Time Calculation (min): 22 min Charges:  OT General Charges $OT Visit: 1 Visit OT Evaluation $OT Eval Moderate Complexity: 1 Mod  Lou Cal, OT [employer] Pager [phone] Office [phone]   Raymondo Band 04/03/2019, 12:42 PM

## 2019-04-03 NOTE — Progress Notes (Signed)
Paged Ghimire MD to notify that pt had a short burst of SVT- pt asymptomatic.

## 2019-04-04 LAB — CULTURE, BLOOD (ROUTINE X 2)
Culture: NO GROWTH
Special Requests: ADEQUATE

## 2019-04-04 LAB — COMPREHENSIVE METABOLIC PANEL
ALT: 30 U/L (ref 0–44)
AST: 23 U/L (ref 15–41)
Albumin: 3.5 g/dL (ref 3.5–5.0)
Alkaline Phosphatase: 109 U/L (ref 38–126)
Anion gap: 10 (ref 5–15)
BUN: 28 mg/dL — ABNORMAL HIGH (ref 8–23)
CO2: 30 mmol/L (ref 22–32)
Calcium: 9 mg/dL (ref 8.9–10.3)
Chloride: 102 mmol/L (ref 98–111)
Creatinine, Ser: 0.56 mg/dL (ref 0.44–1.00)
GFR calc Af Amer: >60 mL/min (ref >60)
GFR calc non Af Amer: >60 mL/min (ref >60)
Glucose, Bld: 112 mg/dL — ABNORMAL HIGH (ref 70–99)
Potassium: 3 mmol/L — ABNORMAL LOW (ref 3.5–5.1)
Sodium: 142 mmol/L (ref 135–145)
Total Bilirubin: 0.8 mg/dL (ref 0.3–1.2)
Total Protein: 6.5 g/dL (ref 6.5–8.1)

## 2019-04-04 LAB — CBC WITH DIFFERENTIAL/PLATELET
Abs Immature Granulocytes: 0.03 10*3/uL (ref 0.00–0.07)
Basophils Absolute: 0 10*3/uL (ref 0.0–0.1)
Basophils Relative: 0 %
Eosinophils Absolute: 0.1 10*3/uL (ref 0.0–0.5)
Eosinophils Relative: 1 %
HCT: 29.7 % — ABNORMAL LOW (ref 36.0–46.0)
Hemoglobin: 9.1 g/dL — ABNORMAL LOW (ref 12.0–15.0)
Immature Granulocytes: 0 %
Lymphocytes Relative: 24 %
Lymphs Abs: 1.8 10*3/uL (ref 0.7–4.0)
MCH: 25.6 pg — ABNORMAL LOW (ref 26.0–34.0)
MCHC: 30.6 g/dL (ref 30.0–36.0)
MCV: 83.4 fL (ref 80.0–100.0)
Monocytes Absolute: 0.9 10*3/uL (ref 0.1–1.0)
Monocytes Relative: 11 %
Neutro Abs: 5 10*3/uL (ref 1.7–7.7)
Neutrophils Relative %: 64 %
Platelets: 260 10*3/uL (ref 150–400)
RBC: 3.56 MIL/uL — ABNORMAL LOW (ref 3.87–5.11)
RDW: 16.7 % — ABNORMAL HIGH (ref 11.5–15.5)
WBC: 7.8 10*3/uL (ref 4.0–10.5)
nRBC: 0 % (ref 0.0–0.2)

## 2019-04-04 LAB — GLUCOSE, CAPILLARY
Glucose-Capillary: 99 mg/dL (ref 70–99)
Glucose-Capillary: 277 mg/dL — ABNORMAL HIGH (ref 70–99)
Glucose-Capillary: 232 mg/dL — ABNORMAL HIGH (ref 70–99)
Glucose-Capillary: 299 mg/dL — ABNORMAL HIGH (ref 70–99)

## 2019-04-04 LAB — PROCALCITONIN: Procalcitonin: 0.76 ng/mL

## 2019-04-04 LAB — BRAIN NATRIURETIC PEPTIDE: B Natriuretic Peptide: 588.8 pg/mL — ABNORMAL HIGH (ref 0.0–100.0)

## 2019-04-04 LAB — C-REACTIVE PROTEIN: CRP: 4 mg/dL — ABNORMAL HIGH (ref <1.0)

## 2019-04-04 LAB — MAGNESIUM: Magnesium: 1.6 mg/dL — ABNORMAL LOW (ref 1.7–2.4)

## 2019-04-04 LAB — D-DIMER, QUANTITATIVE: D-Dimer, Quant: 3.39 ug/mL-FEU — ABNORMAL HIGH (ref 0.00–0.50)

## 2019-04-04 MED ORDER — CARVEDILOL 12.5 MG PO TABS
25.0000 mg | ORAL_TABLET | Freq: Two times a day (BID) | ORAL | Status: DC
  Administered 2019-04-04 – 2019-04-05 (×4): 25 mg via ORAL
  Filled 2019-04-04 (×4): qty 2

## 2019-04-04 MED ORDER — PREDNISONE 10 MG PO TABS: ORAL_TABLET | ORAL | 0 refills | Status: DC

## 2019-04-04 MED ORDER — METFORMIN HCL 1000 MG PO TABS: 1000.0000 mg | ORAL_TABLET | Freq: Every day | ORAL | 11 refills | Status: AC

## 2019-04-04 MED ORDER — INSULIN DETEMIR 100 UNIT/ML ~~LOC~~ SOLN: 30.0000 [IU] | Freq: Every day | SUBCUTANEOUS | 11 refills | Status: DC

## 2019-04-04 MED ORDER — DOXYCYCLINE HYCLATE 100 MG PO TABS: 100.0000 mg | ORAL_TABLET | Freq: Two times a day (BID) | ORAL | 0 refills | Status: DC

## 2019-04-04 NOTE — Discharge Summary (Addendum)
PATIENT DETAILS Name: Desiree Mccall Age: 68 y.o. Sex: female Date of Birth: 09-Oct-1950 MRN: [id number]. Admitting Physician: Leroy Sea, MD SFK:[id number], Jeris Penta, MD  Admit Date: 04/02/2019 Discharge date: 04/04/2019  Recommendations for Outpatient Follow-up:  1. Follow up with PCP in 1-2 weeks 2. Please obtain CMP/CBC in one week 3. Repeat Chest Xray in 4-6 week 4. Please evaluate need to continue ASA/Plavx and Xarelto-at next follow up with cardiology   Admitted From:  Home  Disposition: Home with home health services   Home Health:  Yes  Equipment/Devices: None  Discharge Condition: Stable  CODE STATUS: FULL CODE  Diet recommendation:  Diet Order            Diet - low sodium heart healthy        Diet Carb Modified        Diet heart healthy/carb modified Room service appropriate? Yes; Fluid consistency: Thin; Fluid restriction: 1500 mL Fluid  Diet effective now               Brief Summary: See H&P, Labs, Consult and Test reports for all details in brief, Patient is a 68 y.o. 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, prior history of cardiac arrest-who was admitted to [facility] ICU for acute respiratory distress-she was thought to have acute hypoxic respiratory failure requiring intubation in the setting of decompensated failure and COVID-19 pneumonia.  Patient was treated with steroids, remdesivir, antibiotics, diuretics-extubated-and subsequently transferred to [facility].  [facility] Course: Acute Hypoxic Resp Failure due to decompensated heart failure, concurrent bacterial pneumonia and Covid 19 Viral pneumonia: Clinically improved-suspect main physiology behind hypoxia was decompensated systolic  heart failure-currently well compensated.She was initially admitted to [facility] ICU-required intubation, but then extubated and transferred to [facility]. She was continued on  empiric doxycycline and remdesivir/steroid for pneumonia.  She will finish her last dose of remdesivir today, she is doing great and has been on room air since yesterday. She will be discharge home on tapering steroids, and doxy for a few more days.   COVID-19 Labs:  Recent Labs    04/02/19 1615 04/03/19 0415 04/04/19 0615 04/04/19 0630  DDIMER 4.00* 2.80* 3.39*  --   CRP 10.7* 7.7*  --  4.0*    Lab Results  Component Value Date   SARSCOV2NAA NEGATIVE 02/27/2019     COVID-19 Medications: Steroids: 11/28>> Remdesivir: 11/29>>12/3 Actemra: Not given Convalescent Plasma: Not given  Acute on chronic combined systolic and diastolic heart failure: Euvolemic-treated with IV Lasix, will be transitioned back to Lasix/aldactone on discharge.Continue Coreg,  and ARB.  AICD in place-she follows with cardiology at [facility].  PAF: continue coreg-remains on Xarelto  CAD: No anginal symptoms-remains on dual antiplatelets, statin and Coreg.  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   Weight as of an earlier encounter on 04/02/19: 103.6 kg.    Procedures/Studies: None  Discharge Diagnoses:  Active Problems:   COVID-19 virus infection   Discharge Instructions:    Person Under Monitoring Name: Desiree Mccall  Location: [address] [place] [address]   Infection Prevention Recommendations for Individuals Confirmed to have, or Being Evaluated for, 2019 Novel Coronavirus (COVID-19) Infection Who Receive Care at Home  Individuals who are confirmed to have, or are being evaluated for, COVID-19 should follow the prevention steps below until a healthcare provider or local or state health department says they can return to normal activities.  Stay home except to get medical care You should restrict activities outside your home, except for getting medical care. Do not go to work, school, or public areas, and do not use public transportation or taxis.  Call ahead before visiting your doctor Before your medical appointment, call the healthcare provider and tell them that you have, or are being evaluated for, COVID-19 infection. This will help the healthcare provider's office take steps to keep other people from getting infected. Ask your healthcare provider to call the local or state health department.  Monitor your symptoms Seek prompt medical attention if your illness is worsening (e.g., difficulty breathing). Before going to your medical appointment, call the healthcare provider and tell them that you have, or are being evaluated for, COVID-19 infection. Ask your healthcare provider to call the local or state health department.  Wear a facemask You should wear a facemask that covers your nose and mouth when you are in the same room with other people and when you visit a healthcare provider. People who live with or visit you should also wear a facemask while they are in the same room with you.  Separate yourself from other people in your  home As much as possible, you should stay in a different room from other people in your home. Also, you should use a separate bathroom, if available.  Avoid sharing household items You should not share dishes, drinking glasses, cups, eating utensils, towels, bedding, or other items with other people in your home. After using these items, you should wash them thoroughly with soap and water.  Cover your coughs and sneezes Cover your mouth and nose with a tissue when you cough or sneeze, or you can cough or sneeze into your sleeve. Throw used tissues in a lined trash can, and immediately wash your hands with soap and water for at least 20 seconds or use an alcohol-based hand rub.  Wash your [employer]hands Wash your hands often and thoroughly with soap and water for at least 20 seconds. You can use an alcohol-based hand sanitizer if soap and water are not available and if your hands are not visibly dirty. Avoid touching your eyes, nose, and mouth with unwashed hands.   Prevention Steps for Caregivers and Household Members of Individuals Confirmed to have, or Being Evaluated for, COVID-19 Infection Being Cared for in the Home  If you live with, or provide care at home for, a person confirmed to have, or being evaluated for, COVID-19 infection please follow these guidelines to prevent infection:  Follow healthcare provider's instructions Make sure that you understand and can help the patient  follow any healthcare provider instructions for all care.  Provide for the patient's basic needs You should help the patient with basic needs in the home and provide support for getting groceries, prescriptions, and other personal needs.  Monitor the patient's symptoms If they are getting sicker, call his or her medical provider and tell them that the patient has, or is being evaluated for, COVID-19 infection. This will help the healthcare provider's office take steps to keep other people from getting  infected. Ask the healthcare provider to call the local or state health department.  Limit the number of people who have contact with the patient  If possible, have only one caregiver for the patient.  Other household members should stay in another home or place of residence. If this is not possible, they should stay  in another room, or be separated from the patient as much as possible. Use a separate bathroom, if available.  Restrict visitors who do not have an essential need to be in the home.  Keep older adults, very young children, and other sick people away from the patient Keep older adults, very young children, and those who have compromised immune systems or chronic health conditions away from the patient. This includes people with chronic heart, lung, or kidney conditions, diabetes, and cancer.  Ensure good ventilation Make sure that shared spaces in the home have good air flow, such as from an air conditioner or an opened window, weather permitting.  Wash your hands often  Wash your hands often and thoroughly with soap and water for at least 20 seconds. You can use an alcohol based hand sanitizer if soap and water are not available and if your hands are not visibly dirty.  Avoid touching your eyes, nose, and mouth with unwashed hands.  Use disposable paper towels to dry your hands. If not available, use dedicated cloth towels and replace them when they become wet.  Wear a facemask and gloves  Wear a disposable facemask at all times in the room and gloves when you touch or have contact with the patient's blood, body fluids, and/or secretions or excretions, such as sweat, saliva, sputum, nasal mucus, vomit, urine, or feces.  Ensure the mask fits over your nose and mouth tightly, and do not touch it during use.  Throw out disposable facemasks and gloves after using them. Do not reuse.  Wash your hands immediately after removing your facemask and gloves.  If your personal  clothing becomes contaminated, carefully remove clothing and launder. Wash your hands after handling contaminated clothing.  Place all used disposable facemasks, gloves, and other waste in a lined container before disposing them with other household waste.  Remove gloves and wash your hands immediately after handling these items.  Do not share dishes, glasses, or other household items with the patient  Avoid sharing household items. You should not share dishes, drinking glasses, cups, eating utensils, towels, bedding, or other items with a patient who is confirmed to have, or being evaluated for, COVID-19 infection.  After the person uses these items, you should wash them thoroughly with soap and water.  Wash laundry thoroughly  Immediately remove and wash clothes or bedding that have blood, body fluids, and/or secretions or excretions, such as sweat, saliva, sputum, nasal mucus, vomit, urine, or feces, on them.  Wear gloves when handling laundry from the patient.  Read and follow directions on labels of laundry or clothing items and detergent. In general, wash and dry with the warmest temperatures recommended  on the label.  Clean all areas the individual has used often  Clean all touchable surfaces, such as counters, tabletops, doorknobs, bathroom fixtures, toilets, phones, keyboards, tablets, and bedside tables, every day. Also, clean any surfaces that may have blood, body fluids, and/or secretions or excretions on them.  Wear gloves when cleaning surfaces the patient has come in contact with.  Use a diluted bleach solution (e.g., dilute bleach with 1 part bleach and 10 parts water) or a household disinfectant with a label that says EPA-registered for coronaviruses. To make a bleach solution at home, add 1 tablespoon of bleach to 1 quart (4 cups) of water. For a larger supply, add  cup of bleach to 1 gallon (16 cups) of water.  Read labels of cleaning products and follow  recommendations provided on product labels. Labels contain instructions for safe and effective use of the cleaning product including precautions you should take when applying the product, such as wearing gloves or eye protection and making sure you have good ventilation during use of the product.  Remove gloves and wash hands immediately after cleaning.  Monitor yourself for signs and symptoms of illness Caregivers and household members are considered close contacts, should monitor their health, and will be asked to limit movement outside of the home to the extent possible. Follow the monitoring steps for close contacts listed on the symptom monitoring form.   ? If you have additional questions, contact your local health department or call the epidemiologist on call at [phone] (available 24/7). ? This guidance is subject to change. For the most up-to-date guidance from CDC, please refer to their website: TripMetro.hu    Activity:  As tolerated   Discharge Instructions    (HEART FAILURE PATIENTS) Call MD:  Anytime you have any of the following symptoms: 1) 3 pound weight gain in 24 hours or 5 pounds in 1 week 2) shortness of breath, with or without a dry hacking cough 3) swelling in the hands, feet or stomach 4) if you have to sleep on extra pillows at night in order to breathe.   Complete by: As directed    Call MD for:  difficulty breathing, headache or visual disturbances   Complete by: As directed    Call MD for:  extreme fatigue   Complete by: As directed    Call MD for:  persistant dizziness or light-headedness   Complete by: As directed    Call MD for:  persistant nausea and vomiting   Complete by: As directed    Diet - low sodium heart healthy   Complete by: As directed    Diet Carb Modified   Complete by: As directed    Discharge instructions   Complete by: As directed    Follow with Primary MD  Abram Sander, MD in 1-2 weeks  Please get a complete blood count and chemistry panel checked by your Primary MD at your next visit, and again as instructed by your Primary MD.  Get Medicines reviewed and adjusted: Please take all your medications with you for your next visit with your Primary MD  Laboratory/radiological data: Please request your Primary MD to go over all hospital tests and procedure/radiological results at the follow up, please ask your Primary MD to get all Hospital records sent to his/her office.  In some cases, they will be blood work, cultures and biopsy results pending at the time of your discharge. Please request that your primary care M.D. follows up on these results.  Also Note the following: If you experience worsening of your admission symptoms, develop shortness of breath, life threatening emergency, suicidal or homicidal thoughts you must seek medical attention immediately by calling 911 or calling your MD immediately  if symptoms less severe.  You must read complete instructions/literature along with all the possible adverse reactions/side effects for all the Medicines you take and that have been prescribed to you. Take any new Medicines after you have completely understood and accpet all the possible adverse reactions/side effects.   Do not drive when taking Pain medications or sleeping medications (Benzodaizepines)  Do not take more than prescribed Pain, Sleep and Anxiety Medications. It is not advisable to combine anxiety,sleep and pain medications without talking with your primary care practitioner  Special Instructions: If you have smoked or chewed Tobacco  in the last 2 yrs please stop smoking, stop any regular Alcohol  and or any Recreational drug use.  Wear Seat belts while driving.  Please note: You were cared for by a hospitalist during your hospital stay. Once you are discharged, your primary care physician will handle any further medical issues. Please  note that NO REFILLS for any discharge medications will be authorized once you are discharged, as it is imperative that you return to your primary care physician (or establish a relationship with a primary care physician if you do not have one) for your post hospital discharge needs so that they can reassess your need for medications and monitor your lab values.   3 weeks of isolation from 03/30/19   Increase activity slowly   Complete by: As directed      Allergies as of 04/04/2019   [name]     Medication List    TAKE these medications   acetaminophen 325 MG tablet Commonly known as: TYLENOL Take 2 tablets (650 mg total) by mouth every 4 (four) hours as needed for mild pain (temp > 101.5).   aspirin EC 81 MG tablet Take 81 mg by mouth daily.   atorvastatin 40 MG tablet Commonly known as: LIPITOR Take 1 tablet (40 mg total) by mouth daily at 6 PM.   carvedilol 6.25 MG tablet Commonly known as: COREG Take 1 tablet (6.25 mg total) by mouth 2 (two) times daily with a meal.   doxycycline 100 MG tablet Commonly known as: VIBRA-TABS Take 1 tablet (100 mg total) by mouth 2 (two) times daily.   FLUoxetine 20 MG capsule Commonly known as: PROZAC Take 2 capsules by mouth daily.   furosemide 20 MG tablet Commonly known as: LASIX Take 20 mg by mouth daily.   insulin detemir 100 UNIT/ML injection Commonly known as: Levemir Inject 0.3 mLs (30 Units total) into the skin daily.   losartan 50 MG tablet Commonly known as: COZAAR Take 100 mg by mouth daily.   metFORMIN 1000 MG tablet Commonly known as: Glucophage Take 1 tablet (1,000 mg total) by mouth daily with breakfast.   metoprolol succinate 100 MG 24 hr tablet Commonly known as: TOPROL-XL Take 100 mg by mouth daily.   predniSONE 10 MG tablet Commonly known as: DELTASONE Take 40 mg daily for 1 day, 30 mg daily for 1 day, 20 mg daily for 1 days,10 mg daily for 1 day, then stop   spironolactone 25 MG  tablet Commonly known as: ALDACTONE Take 1 tablet (25 mg total) by mouth daily.       [name]   Consultations:   pulmonary/intensive care   Other Procedures/Studies: Dg Chest  Port 1 View  Result Date: 04/02/2019 CLINICAL DATA:  Shortness of breath. EXAM: PORTABLE CHEST 1 VIEW COMPARISON:  Radiograph yesterday. CT 02/27/2019 FINDINGS: Endotracheal and enteric tubes have been removed. Improved lung volumes. Left-sided pacemaker remains in place. Post aortic stent graft repair, unchanged in radiographic appearance. Unchanged cardiomegaly. Unchanged mediastinal contours. Mild vascular congestion without pulmonary edema. Minimal blunting of left costophrenic angle suggesting small effusion. Streaky bibasilar atelectasis. No pneumothorax. IMPRESSION: 1. Increasing vascular congestion, stable cardiomegaly. 2. Improved lung volumes after extubation. Streaky bibasilar atelectasis. 3. Possible small left pleural effusion, increased. Electronically Signed   By: Narda Rutherford M.D.   On: 04/02/2019 18:21   Dg Chest Port 1 View  Result Date: 04/01/2019 CLINICAL DATA:  Acute respiratory failure. EXAM: PORTABLE CHEST 1 VIEW COMPARISON:  03/31/2019. FINDINGS: Endotracheal tube and NG tube in stable position. Cardiac pacer stable position. Aortic stent graft and vascular coils noted in stable position. Stable cardiomegaly. No pulmonary venous congestion. No focal infiltrate. Mild bibasilar subsegmental atelectasis again noted. No pleural effusion noted on today's exam. No pneumothorax. IMPRESSION: 1.  Endotracheal tube and NG tube in stable position. 2. Cardiac pacer in stable position. Aortic stent graft and vascular coils noted stable position. Stable cardiomegaly. No pulmonary venous congestion. 3. Mild bibasilar subsegmental atelectasis again noted. No pleural effusion noted on today's exam. Electronically Signed   By: Maisie Fus  Register   On: 04/01/2019 06:13   Dg Chest Port 1 View  Result  Date: 03/31/2019 CLINICAL DATA:  Shortness of breath. EXAM: PORTABLE CHEST 1 VIEW COMPARISON:  Chest x-rays dated 03/30/2019 and 02/28/2019. FINDINGS: Improved aeration within the upper lobes bilaterally suggesting improved fluid status. Probable bibasilar atelectasis and/or small pleural effusions. Stable cardiomegaly. Endotracheal tube appears adequately positioned with tip at the level of the clavicles. Enteric tube passes below the diaphragm. Aortic stent appears stable in configuration. LEFT chest wall pacemaker/ICD apparatus appears stable. IMPRESSION: 1. Improved aeration within the upper lobes bilaterally suggesting improved fluid status. 2. Probable bibasilar atelectasis and/or small pleural effusions. 3. Stable cardiomegaly. Electronically Signed   By: Bary Richard M.D.   On: 03/31/2019 10:58   Dg Chest Portable 1 View  Result Date: 03/30/2019 CLINICAL DATA:  Shortness of breath, respiratory failure EXAM: PORTABLE CHEST 1 VIEW COMPARISON:  02/28/2019 FINDINGS: Left AICD remains in place, unchanged. The endotracheal tube is 6 cm above the carina. NG tube enters the stomach. Prior aortic stent graft. Airspace disease bilaterally, most pronounced in the upper lobes and perihilar regions. Given the upper lobe predominance, favor infection over edema. No visible effusions. No acute bony abnormality. IMPRESSION: Cardiomegaly, vascular congestion. Bilateral upper lobe and perihilar opacities concerning for pneumonia. Electronically Signed   By: Charlett Nose M.D.   On: 03/30/2019 21:39     TODAY-DAY OF DISCHARGE:  Subjective:   Desiree Mccall today has no headache,no chest abdominal pain,no new weakness tingling or numbness, feels much better wants to go home today.   Objective:   Blood pressure (!) 173/66, pulse 60, temperature 97.8 F (36.6 C), temperature source Oral, resp. rate 18, SpO2 100 %.  Intake/Output Summary (Last 24 hours) at 04/04/2019 0951 Last data filed at 04/04/2019 0400 Gross  per 24 hour  Intake 700 ml  Output 2200 ml  Net -1500 ml   There were no vitals filed for this visit.  Exam: Awake Alert, Oriented *3, No new F.N deficits, Normal affect [place].AT,PERRAL Supple Neck,No JVD, No cervical lymphadenopathy appriciated.  Symmetrical Chest wall movement, Good air movement bilaterally,  CTAB RRR,No Gallops,Rubs or new Murmurs, No Parasternal Heave +ve B.Sounds, Abd Soft, Non tender, No organomegaly appriciated, No rebound -guarding or rigidity. No Cyanosis, Clubbing or edema, No new Rash or bruise   PERTINENT RADIOLOGIC STUDIES: Dg Chest Port 1 View  Result Date: 04/02/2019 CLINICAL DATA:  Shortness of breath. EXAM: PORTABLE CHEST 1 VIEW COMPARISON:  Radiograph yesterday. CT 02/27/2019 FINDINGS: Endotracheal and enteric tubes have been removed. Improved lung volumes. Left-sided pacemaker remains in place. Post aortic stent graft repair, unchanged in radiographic appearance. Unchanged cardiomegaly. Unchanged mediastinal contours. Mild vascular congestion without pulmonary edema. Minimal blunting of left costophrenic angle suggesting small effusion. Streaky bibasilar atelectasis. No pneumothorax. IMPRESSION: 1. Increasing vascular congestion, stable cardiomegaly. 2. Improved lung volumes after extubation. Streaky bibasilar atelectasis. 3. Possible small left pleural effusion, increased. Electronically Signed   By: Narda Rutherford M.D.   On: 04/02/2019 18:21   Dg Chest Port 1 View  Result Date: 04/01/2019 CLINICAL DATA:  Acute respiratory failure. EXAM: PORTABLE CHEST 1 VIEW COMPARISON:  03/31/2019. FINDINGS: Endotracheal tube and NG tube in stable position. Cardiac pacer stable position. Aortic stent graft and vascular coils noted in stable position. Stable cardiomegaly. No pulmonary venous congestion. No focal infiltrate. Mild bibasilar subsegmental atelectasis again noted. No pleural effusion noted on today's exam. No pneumothorax. IMPRESSION: 1.  Endotracheal tube and  NG tube in stable position. 2. Cardiac pacer in stable position. Aortic stent graft and vascular coils noted stable position. Stable cardiomegaly. No pulmonary venous congestion. 3. Mild bibasilar subsegmental atelectasis again noted. No pleural effusion noted on today's exam. Electronically Signed   By: Maisie Fus  Register   On: 04/01/2019 06:13   Dg Chest Port 1 View  Result Date: 03/31/2019 CLINICAL DATA:  Shortness of breath. EXAM: PORTABLE CHEST 1 VIEW COMPARISON:  Chest x-rays dated 03/30/2019 and 02/28/2019. FINDINGS: Improved aeration within the upper lobes bilaterally suggesting improved fluid status. Probable bibasilar atelectasis and/or small pleural effusions. Stable cardiomegaly. Endotracheal tube appears adequately positioned with tip at the level of the clavicles. Enteric tube passes below the diaphragm. Aortic stent appears stable in configuration. LEFT chest wall pacemaker/ICD apparatus appears stable. IMPRESSION: 1. Improved aeration within the upper lobes bilaterally suggesting improved fluid status. 2. Probable bibasilar atelectasis and/or small pleural effusions. 3. Stable cardiomegaly. Electronically Signed   By: Bary Richard M.D.   On: 03/31/2019 10:58   Dg Chest Portable 1 View  Result Date: 03/30/2019 CLINICAL DATA:  Shortness of breath, respiratory failure EXAM: PORTABLE CHEST 1 VIEW COMPARISON:  02/28/2019 FINDINGS: Left AICD remains in place, unchanged. The endotracheal tube is 6 cm above the carina. NG tube enters the stomach. Prior aortic stent graft. Airspace disease bilaterally, most pronounced in the upper lobes and perihilar regions. Given the upper lobe predominance, favor infection over edema. No visible effusions. No acute bony abnormality. IMPRESSION: Cardiomegaly, vascular congestion. Bilateral upper lobe and perihilar opacities concerning for pneumonia. Electronically Signed   By: Charlett Nose M.D.   On: 03/30/2019 21:39     PERTINENT LAB RESULTS: CBC: Recent  Labs    04/03/19 0415 04/04/19 0615  WBC 7.3 7.8  HGB 8.5* 9.1*  HCT 27.9* 29.7*  PLT 187 260   CMET CMP     Component Value Date/Time   NA 142 04/04/2019 0615   K 3.0 (L) 04/04/2019 0615   CL 102 04/04/2019 0615   CO2 30 04/04/2019 0615   GLUCOSE 112 (H) 04/04/2019 0615   BUN 28 (H) 04/04/2019 0615   CREATININE 0.56 04/04/2019  0615   CALCIUM 9.0 04/04/2019 0615   PROT 6.5 04/04/2019 0615   ALBUMIN 3.5 04/04/2019 0615   AST 23 04/04/2019 0615   ALT 30 04/04/2019 0615   ALKPHOS 109 04/04/2019 0615   BILITOT 0.8 04/04/2019 0615   GFRNONAA >60 04/04/2019 0615   GFRAA >60 04/04/2019 0615    GFR Estimated Creatinine Clearance: 81.1 mL/min (by C-G formula based on SCr of 0.56 mg/dL). No results for input(s): LIPASE, AMYLASE in the last 72 hours. No results for input(s): CKTOTAL, CKMB, CKMBINDEX, TROPONINI in the last 72 hours. Invalid input(s): POCBNP Recent Labs    04/03/19 0415 04/04/19 0615  DDIMER 2.80* 3.39*   No results for input(s): HGBA1C in the last 72 hours. No results for input(s): CHOL, HDL, LDLCALC, TRIG, CHOLHDL, LDLDIRECT in the last 72 hours. No results for input(s): TSH, T4TOTAL, T3FREE, THYROIDAB in the last 72 hours.  Invalid input(s): FREET3 No results for input(s): VITAMINB12, FOLATE, FERRITIN, TIBC, IRON, RETICCTPCT in the last 72 hours. Coags: No results for input(s): [name] in the last 72 hours.  Invalid input(s): PT Microbiology: Recent Results (from the past 240 hour(s))  Blood Culture (routine x 2)     Status: None   Collection Time: 03/30/19  9:35 PM   Specimen: BLOOD  Result Value Ref Range Status   Specimen Description BLOOD LEFT ANTECUBITAL  Final   Special Requests   Final    BOTTLES DRAWN AEROBIC AND ANAEROBIC Blood Culture adequate volume   Culture   Final    NO GROWTH 5 DAYS Performed at [facility], [address] [place], [place] [address]    Report Status 04/04/2019 FINAL  Final  Blood Culture (routine x 2)      Status: None (Preliminary result)   Collection Time: 03/30/19  9:37 PM   Specimen: BLOOD  Result Value Ref Range Status   Specimen Description BLOOD BLOOD LEFT ARM  Final   Special Requests   Final    BOTTLES DRAWN AEROBIC AND ANAEROBIC Blood Culture adequate volume   Culture  Setup Time   Final    GRAM POSITIVE COCCI IN BOTH AEROBIC AND ANAEROBIC BOTTLES CRITICAL RESULT CALLED TO, READ BACK BY AND VERIFIED WITH: Mila Merry 03/31/2019 AT 2039 HS Performed at [facility], [address] [place], [place] [address]    Culture GRAM POSITIVE COCCI  Final   Report Status PENDING  Incomplete  MRSA PCR Screening     Status: None   Collection Time: 03/31/19  4:02 AM   Specimen: Nasopharyngeal  Result Value Ref Range Status   MRSA by PCR NEGATIVE NEGATIVE Final    Comment:        The GeneXpert MRSA Assay (FDA approved for NASAL specimens only), is one component of a comprehensive MRSA colonization surveillance program. It is not intended to diagnose MRSA infection nor to guide or monitor treatment for MRSA infections. Performed at [facility], [address] [place], [place] [address]   Culture, respiratory (non-expectorated)     Status: None   Collection Time: 03/31/19 12:28 PM   Specimen: Tracheal Aspirate; Respiratory  Result Value Ref Range Status   Specimen Description   Final    TRACHEAL ASPIRATE Performed at [facility], [address], [place], [place] [address]    Special Requests   Final    Normal Performed at [facility], [address] [place], [place] [address]    Gram Stain   Final    FEW  WBC PRESENT, PREDOMINANTLY PMN RARE GRAM POSITIVE COCCI IN PAIRS RARE GRAM POSITIVE RODS    Culture   Final    Consistent with normal respiratory flora. Performed at [facility], [address], [place], [place] [address]    Report Status 04/02/2019 FINAL  Final  Urine culture     Status: None   Collection Time:  04/01/19  2:23 AM   Specimen: In/Out Cath Urine  Result Value Ref Range Status   Specimen Description   Final    IN/OUT CATH URINE Performed at [facility], [address] [place], [place] [address]    Special Requests   Final    NONE Performed at [facility], [address] [place], [place] [address]    Culture   Final    NO GROWTH Performed at [facility], [address] [place], [place] [address]    Report Status 04/02/2019 FINAL  Final    FURTHER DISCHARGE INSTRUCTIONS:  Get Medicines reviewed and adjusted: Please take all your medications with you for your next visit with your Primary MD  Laboratory/radiological data: Please request your Primary MD to go over all hospital tests and procedure/radiological results at the follow up, please ask your Primary MD to get all Hospital records sent to his/her office.  In some cases, they will be blood work, cultures and biopsy results pending at the time of your discharge. Please request that your primary care M.D. goes through all the records of your hospital data and follows up on these results.  Also Note the following: If you experience worsening of your admission symptoms, develop shortness of breath, life threatening emergency, suicidal or homicidal thoughts you must seek medical attention immediately by calling 911 or calling your MD immediately  if symptoms less severe.  You must read complete instructions/literature along with all the possible adverse reactions/side effects for all the Medicines you take and that have been prescribed to you. Take any new Medicines after you have completely understood and accpet all the possible adverse reactions/side effects.   Do not drive when taking Pain medications or sleeping medications (Benzodaizepines)  Do not take more than prescribed Pain, Sleep and Anxiety Medications. It is not advisable to combine anxiety,sleep and pain medications without talking  with your primary care practitioner  Special Instructions: If you have smoked or chewed Tobacco  in the last 2 yrs please stop smoking, stop any regular Alcohol  and or any Recreational drug use.  Wear Seat belts while driving.  Please note: You were cared for by a hospitalist during your hospital stay. Once you are discharged, your primary care physician will handle any further medical issues. Please note that NO REFILLS for any discharge medications will be authorized once you are discharged, as it is imperative that you return to your primary care physician (or establish a relationship with a primary care physician if you do not have one) for your post hospital discharge needs so that they can reassess your need for medications and monitor your lab values.  Total Time spent coordinating discharge including counseling, education and face to face time equals 35 minutes.  SignedJeoffrey Massed 04/04/2019 9:51 AM

## 2019-04-04 NOTE — Progress Notes (Signed)
Plans were to discharge-however 1 set out of 2 blood cultures drawn on 11/28 came back positive for gram-positive cocci and veillonella.  Patient has an AICD.  High suspicion this is a contamination-but given the fact that patient has an AICD-I reached out to Dr. Valla Leaver suggested that we repeat blood culture-if blood culture negative x24 hours-okay to discharge on at least 10-day course of doxycycline.

## 2019-04-04 NOTE — TOC Transition Note (Signed)
Transition of Care [facility]) - CM/SW Discharge Note   Patient Details  Name: Desiree Mccall MRN: [id number] Date of Birth: [birth year]  Transition of Care [facility]) CM/SW Contact:  Rae Mar, RN Phone Number: 04/04/2019, 11:16 AM   Clinical Narrative:     CM spoke with pt via cell phone to discuss [facility] agency choice.  Pt reports she has use [facility] in the past and would like to use them again.  CM advised I would call them first and then offered secondary choices of [facility] and [facility], both of which accept her insurance.  She stated either would be ok.  Pt reports no DME needs at home and no further questions or concerns about her transition home.  CM contacted Arbie Cookey with [facility] who advised the start date would be 12/8.  CM contacted Malachy Mood with [facility] who advised they would be able to likely start her on 12/5 pending insurance approval and accepted pt for services; [facility] RN PT OT.  CM updated pt's AVS with Cheryl's information.  No further TOC needs noted at this time.  Final next level of care: [place] Barriers to Discharge: Continued Medical Work up   Patient Goals and CMS Choice     Choice offered to / list presented to : Patient  Discharge Placement                       Discharge Plan and Services                          HH Arranged: RN, PT, OT [facility] Agency: [place] Date [facility] Agency Contacted: 04/04/19 Time [place]: 1108 Representative spoke with at [place]: [place] ([place]) Interventions     Readmission Risk Interventions Readmission Risk Prevention Plan 04/02/2019 03/03/2019  Transportation Screening Complete Complete  PCP or Specialist Appt within 3-5 Days - Complete  HRI or [place] - Complete  Social Work Consult for [place] Planning/Counseling - Complete  Palliative Care Screening - Complete  Medication Review Press photographer) Complete Complete   SW Recovery Care/Counseling Consult Complete -  Some recent data might be hidden

## 2019-04-05 DIAGNOSIS — R0602 Shortness of breath: Secondary | ICD-10-CM

## 2019-04-05 LAB — COMPREHENSIVE METABOLIC PANEL
ALT: 30 U/L (ref 0–44)
AST: 24 U/L (ref 15–41)
Albumin: 2.9 g/dL — ABNORMAL LOW (ref 3.5–5.0)
Alkaline Phosphatase: 97 U/L (ref 38–126)
Anion gap: 10 (ref 5–15)
BUN: 27 mg/dL — ABNORMAL HIGH (ref 8–23)
CO2: 29 mmol/L (ref 22–32)
Calcium: 8.4 mg/dL — ABNORMAL LOW (ref 8.9–10.3)
Chloride: 103 mmol/L (ref 98–111)
Creatinine, Ser: 0.58 mg/dL (ref 0.44–1.00)
GFR calc Af Amer: >60 mL/min (ref >60)
GFR calc non Af Amer: >60 mL/min (ref >60)
Glucose, Bld: 130 mg/dL — ABNORMAL HIGH (ref 70–99)
Potassium: 3.2 mmol/L — ABNORMAL LOW (ref 3.5–5.1)
Sodium: 142 mmol/L (ref 135–145)
Total Bilirubin: 0.8 mg/dL (ref 0.3–1.2)
Total Protein: 5.6 g/dL — ABNORMAL LOW (ref 6.5–8.1)

## 2019-04-05 LAB — GLUCOSE, CAPILLARY
Glucose-Capillary: 85 mg/dL (ref 70–99)
Glucose-Capillary: 293 mg/dL — ABNORMAL HIGH (ref 70–99)
Glucose-Capillary: 273 mg/dL — ABNORMAL HIGH (ref 70–99)
Glucose-Capillary: 313 mg/dL — ABNORMAL HIGH (ref 70–99)
Glucose-Capillary: 264 mg/dL — ABNORMAL HIGH (ref 70–99)

## 2019-04-05 LAB — CBC WITH DIFFERENTIAL/PLATELET
Abs Immature Granulocytes: 0.04 10*3/uL (ref 0.00–0.07)
Basophils Absolute: 0 10*3/uL (ref 0.0–0.1)
Basophils Relative: 0 %
Eosinophils Absolute: 0 10*3/uL (ref 0.0–0.5)
Eosinophils Relative: 1 %
HCT: 29.1 % — ABNORMAL LOW (ref 36.0–46.0)
Hemoglobin: 9 g/dL — ABNORMAL LOW (ref 12.0–15.0)
Immature Granulocytes: 1 %
Lymphocytes Relative: 23 %
Lymphs Abs: 1.8 10*3/uL (ref 0.7–4.0)
MCH: 25.4 pg — ABNORMAL LOW (ref 26.0–34.0)
MCHC: 30.9 g/dL (ref 30.0–36.0)
MCV: 82.2 fL (ref 80.0–100.0)
Monocytes Absolute: 0.9 10*3/uL (ref 0.1–1.0)
Monocytes Relative: 12 %
Neutro Abs: 5 10*3/uL (ref 1.7–7.7)
Neutrophils Relative %: 63 %
Platelets: 265 10*3/uL (ref 150–400)
RBC: 3.54 MIL/uL — ABNORMAL LOW (ref 3.87–5.11)
RDW: 16.7 % — ABNORMAL HIGH (ref 11.5–15.5)
WBC: 7.8 10*3/uL (ref 4.0–10.5)
nRBC: 0 % (ref 0.0–0.2)

## 2019-04-05 LAB — MAGNESIUM
Magnesium: 1.6 mg/dL — ABNORMAL LOW (ref 1.7–2.4)
Magnesium: 1.6 mg/dL — ABNORMAL LOW (ref 1.7–2.4)

## 2019-04-05 LAB — D-DIMER, QUANTITATIVE: D-Dimer, Quant: 2.79 ug/mL-FEU — ABNORMAL HIGH (ref 0.00–0.50)

## 2019-04-05 LAB — POTASSIUM: Potassium: 4.3 mmol/L (ref 3.5–5.1)

## 2019-04-05 LAB — C-REACTIVE PROTEIN: CRP: 2 mg/dL — ABNORMAL HIGH (ref <1.0)

## 2019-04-05 LAB — BRAIN NATRIURETIC PEPTIDE: B Natriuretic Peptide: 841.6 pg/mL — ABNORMAL HIGH (ref 0.0–100.0)

## 2019-04-05 LAB — CULTURE, BLOOD (ROUTINE X 2): Special Requests: ADEQUATE

## 2019-04-05 MED ORDER — MAGNESIUM SULFATE 4 GM/100ML IV SOLN
4.0000 g | Freq: Once | INTRAVENOUS | Status: AC
  Administered 2019-04-05: 21:00:00 4 g via INTRAVENOUS
  Filled 2019-04-05: qty 100

## 2019-04-05 MED ORDER — MAGNESIUM SULFATE 2 GM/50ML IV SOLN
2.0000 g | Freq: Once | INTRAVENOUS | Status: AC
  Administered 2019-04-05: 09:00:00 2 g via INTRAVENOUS
  Filled 2019-04-05: qty 50

## 2019-04-05 MED ORDER — FUROSEMIDE 10 MG/ML IJ SOLN
40.0000 mg | Freq: Every day | INTRAMUSCULAR | Status: DC
  Administered 2019-04-05 – 2019-04-07 (×3): 40 mg via INTRAVENOUS
  Filled 2019-04-05 (×3): qty 4

## 2019-04-05 MED ORDER — POTASSIUM CHLORIDE CRYS ER 20 MEQ PO TBCR
40.0000 meq | EXTENDED_RELEASE_TABLET | ORAL | Status: AC
  Administered 2019-04-05 (×2): 40 meq via ORAL
  Filled 2019-04-05 (×2): qty 2

## 2019-04-05 NOTE — Progress Notes (Signed)
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 L stent in ureter, L heart cath and angiography, chole, carotid stent. OSA on CPAP. Prestented to ER after thanksgiving w/ SOB was found to have severe respiratory distress and was intubated, COVID testing came back positive. extubated with 48hrs of admisison then sent to [facility].    PT Comments    Pt was to d/c home but blood work came back with cocci and veilonella. This am she seems to be mor alert, still quite forgetful. Was able to ambulate approx 233ft with HHA and min guard assist but is very fatigued at end of this. She states it felt good on her back but has taken a lot out of her. Therapist has recommended she gets up and ambulates in room more often prior to d/c home. Pt is of monitoring this am. D/C plans remain appropriate.    Follow Up Recommendations  Home health PT     Equipment Recommendations  None recommended by PT    Recommendations for Other Services       Precautions / Restrictions Precautions Precautions: Fall Restrictions Weight Bearing Restrictions: No    Mobility  Bed Mobility Overal bed mobility: Modified Independent                Transfers Overall transfer level: Modified independent   Transfers: Sit to/from [employer] Transfers              Ambulation/Gait Ambulation/Gait assistance: Min guard Gait Distance (Feet): 250 Feet Assistive device: 1 person hand held assist Gait Pattern/deviations: Step-through pattern     General Gait Details: very slow and cautious sadence   Stairs             Wheelchair Mobility    Modified Rankin  (Stroke Patients Only)       Balance Overall balance assessment: Needs assistance Sitting-balance support: Feet supported Sitting balance-Leahy Scale: Good     Standing balance support: During functional activity Standing balance-Leahy Scale: Fair                              Cognition Arousal/Alertness: Awake/alert Behavior During Therapy: WFL for tasks assessed/performed Overall Cognitive Status: Within Functional Limits for tasks assessed                                        Exercises      General Comments        Pertinent Vitals/Pain Pain Assessment: No/denies pain    Home Living                      Prior Function            PT Goals (current goals can now be found in the care plan section) Acute Rehab PT Goals Patient Stated Goal: was to go home but irregularities found in blood work PT Goal Formulation: With patient Time For Goal Achievement: 04/17/19 Potential to Achieve Goals: Good Progress towards PT goals: Progressing toward goals    Frequency    Min 3X/week  PT Plan Current plan remains appropriate    Co-evaluation              AM-PAC PT "6 Clicks" Mobility   Outcome Measure  Help needed turning from your back to your side while in a flat bed without using bedrails?: None Help needed moving from lying on your back to sitting on the side of a flat bed without using bedrails?: None Help needed moving to and from a bed to a chair (including a wheelchair)?: None Help needed standing up from a chair using your arms (e.g., wheelchair or bedside chair)?: None Help needed to walk in hospital room?: A Little Help needed climbing 3-5 steps with a railing? : A Lot 6 Click Score: 21    End of Session   Activity Tolerance: Patient limited by lethargy;Patient limited by fatigue Patient left: in chair;with call bell/phone within reach Nurse Communication: Mobility status PT Visit Diagnosis:  Unsteadiness on feet (R26.81);Muscle weakness (generalized) (M62.81)     Time: 3875-6433 PT Time Calculation (min) (ACUTE ONLY): 15 min  Charges:  $Gait Training: 8-22 mins                     Drema Pry, PT    Freddi Starr 04/05/2019, 2:20 PM

## 2019-04-05 NOTE — Progress Notes (Signed)
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  She was up at the sink off of Telemetry washing up.  Called out for help and stated to Staff RNs that she couldn't breathe and was having pain.  EKG obtained. Labs ordered. See orders.  VS obtained.  BP 97/69. HR 76, NSR PVCs.  Notified Charge and MD.  MD requests to have AICD interogated and states that it is [employer]. Notified Charge and will notify next shift in report.  Denies pain now. Denies Shortness of breath now. Denies dizziness.  On telemetry monitor. Call light in reach. Continuing to monitor.

## 2019-04-05 NOTE — Progress Notes (Signed)
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  She was up at the sink off of Telemetry washing up.  Called out for help and stated to Staff RNs that she couldn't breathe and was having pain.  EKG obtained. Labs ordered. See orders.  VS obtained.  BP 97/69. HR 76, NSR PVCs.  Notified Charge and MD.  MD requests to have AICD interogated and states that it is [employer]. Notified Charge and will notify next shift in report.  Denies pain now. Denies Shortness of breath now. Denies dizziness.  On telemetry monitor. Call light in reach. Continuing to monitor.

## 2019-04-05 NOTE — Progress Notes (Signed)
PROGRESS NOTE                                                                                                                                                                                                             Patient Demographics:    Desiree Mccall, is a 68 y.o. female, DOB - 09-Oct-1950, EAV:[id number]  Outpatient Primary MD for the patient is Richarda Blade, Jeris Penta, MD   Admit date - 04/02/2019   LOS - 3  No chief complaint on file.      Brief Narrative: Patient is a 68 y.o. 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  Patient was treated with steroids, remdesivir, antibiotics, diuretics-extubated-and subsequently transferred to [facility].   Subjective:    Jule Economy today remains on room air-she has no complaints.  Repeat blood cultures done yesterday still pending.   Assessment  & Plan :   Acute Hypoxic Resp Failure due to decompensated heart failure, concurrent bacterial pneumonia and Covid 19 Viral pneumonia: Clinically improved-suspect main physiology behind hypoxia was decompensated systolic heart failure-currently well compensated.  She remains on empiric doxycycline and remdesivir/steroid for pneumonia.  Suspect could be weaned off oxygen soon, await recommendations from physical therapy.  Fever: afebrile  O2 requirements:  SpO2: 96 % O2 Flow Rate (L/min): 1 L/min   COVID-19 Labs: Recent Labs    04/03/19 0415 04/04/19 0615  04/04/19 0630 04/05/19 0220  DDIMER 2.80* 3.39*  --  2.79*  CRP 7.7*  --  4.0* 2.0*       Component Value Date/Time   BNP 841.6 (H) 04/05/2019 0220    Recent Labs  Lab 04/02/19 0441 04/02/19 1615 04/03/19 0415 04/04/19 0615  PROCALCITON 3.03 2.23 1.83 0.76    Lab Results  Component Value Date   SARSCOV2NAA NEGATIVE 02/27/2019     COVID-19 Medications: Steroids: 11/28>> Remdesivir: 11/29>>/23 Actemra: Not given Convalescent Plasma: Not given  Other medications: Diuretics:Euvolemic-on Lasix  40 mg IV daily. Antibiotics: See below  Prone/Incentive Spirometry: encouraged incentive spirometry use 3-4/hour.  DVT Prophylaxis  : Xarelto  Gram positive cocci  and veillonella bacteremia: Await final culture results-Case was discussed with infectious disease-Dr. Drue Second yesterday-recommendations are to repeat blood cultures-and if negative for more than 24 hours-to discharge on doxycycline for total of 10 days.  Note patient has an AICD in place  Acute on chronic combined systolic and diastolic heart failure: Euvolemic-continue intravenous Lasix.  Remains on Coreg, ARB and Aldactone.   AICD in place-she follows with cardiology at [facility].  PAF: Had a burst of narrow complex tachycardia a few days back-currently stable on Coreg 12.5 mg twice daily, remains on Xarelto.    CAD: No anginal symptoms-remains on dual antiplatelets, statin and Coreg.  Patient confirms that she is indeed on dual antiplatelet agents-have asked her to follow-up with her cardiologist to see if she still needs to be on both of these agents (in addition to Xarelto).  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  Follow and adjust  CBG (last 3)  Recent Labs    04/04/19 2047 04/05/19 0840 04/05/19 1129  GLUCAP 299* 85 293*    COPD: Continue inhalers-no evidence of flare  Tobacco abuse: Counseled  OSA: Resume CPAP on discharge-per facility policy-unable to use CPAP-use oxygen while here.  Obesity: Estimated body mass index is 39.2 kg/m as calculated from the following:   Height as of 03/30/19: 5\' 4"  (1.626 m).   Weight as of an earlier encounter on 04/02/19: 103.6 kg.    Consults  :  None  Procedures  :  None  ABG:    Component Value Date/Time   PHART 7.35 03/31/2019 2300   PCO2ART 45 03/31/2019 2300   PO2ART 103 03/31/2019 2300   HCO3 24.8 03/31/2019 2300   TCO2 25 09/19/2009 0932   ACIDBASEDEF 1.1 03/31/2019 2300   O2SAT 97.6 03/31/2019 2300    Vent Settings: N/A   Condition - Stable  Family Communication  : left voicemail for daughter 12/4  Code Status :  Full Code  Diet :  Diet Order            Diet - low sodium heart healthy        Diet Carb Modified        Diet heart healthy/carb modified Room service appropriate? Yes; Fluid consistency: Thin; Fluid restriction: 1500 mL Fluid  Diet effective now               Disposition Plan  :  Remain hospitalized  Barriers to discharge: Hypoxia requiring O2 supplementation/complete 5 days of IV Remdesivir  Antimicorbials  :    Anti-infectives (From admission, onward)   Start     Dose/Rate Route Frequency Ordered Stop   04/04/19 0000  doxycycline (VIBRA-TABS) 100 MG tablet     100 mg Oral 2 times daily 04/04/19 0951     04/03/19 1000  remdesivir 100 mg in sodium chloride 0.9 % 250 mL IVPB     100 mg 500 mL/hr over 30 Minutes Intravenous Daily 04/02/19 1703 04/04/19 1758   04/02/19 1800  doxycycline (VIBRA-TABS) tablet 100 mg     100 mg Oral Every 12 hours  04/02/19 1645        Inpatient Medications  Scheduled Meds: . aspirin EC  81 mg Oral Daily  . atorvastatin  40 mg Oral q1800   . carvedilol  25 mg Oral BID WC  . Chlorhexidine Gluconate Cloth  6 each Topical Daily  . clopidogrel  75 mg Oral Daily  . dexamethasone  4 mg Oral Daily  . doxycycline  100 mg Oral Q12H  . furosemide  40 mg Intravenous Daily  . insulin aspart  0-5 Units Subcutaneous QHS  . insulin aspart  0-9 Units Subcutaneous TID WC  . insulin glargine  15 Units Subcutaneous BID  . losartan  25 mg Oral Daily  . pantoprazole  40 mg Oral Daily  . rivaroxaban  20 mg Oral Q supper  . spironolactone  25 mg Oral Daily   Continuous Infusions:  PRN Meds:.acetaminophen, albuterol, bisacodyl, ondansetron **OR** ondansetron (ZOFRAN) IV   Time Spent in minutes  25   Oren Binet M.D on 04/05/2019 at 1:50 PM  To page go to [email or web address] - use universal password  [facility] -  Office  [phone]    Objective:   Vitals:   04/04/19 0447 04/04/19 1931 04/05/19 0845 04/05/19 1131  BP: (!) 173/66 103/77 (!) 165/89 (!) 114/53  Pulse: 60 (!) 144 77 88  Resp: 18 (!) 22 16 18   Temp: 97.8 F (36.6 C) 97.9 F (36.6 C) 98.3 F (36.8 C) 97.9 F (36.6 C)  TempSrc: Oral Oral Oral Oral  SpO2: 100% 94% 98% 96%    Wt Readings from Last 3 Encounters:  04/02/19 103.6 kg  03/03/19 100.8 kg  06/05/18 103.9 kg     Intake/Output Summary (Last 24 hours) at 04/05/2019 1350 Last data filed at 04/05/2019 1200 Gross per 24 hour  Intake 650 ml  Output -  Net 650 ml     Physical Exam Gen Exam:Alert awake-not in any distress HEENT:atraumatic, normocephalic Chest: B/L clear to auscultation anteriorly CVS:S1S2 regular Abdomen:soft non tender, non distended Extremities:no edema Neurology: Non focal Skin: no rash   Data Review:    CBC Recent Labs  Lab 03/30/19 2124  04/02/19 0441 04/02/19 1615 04/03/19 0415 04/04/19 0615 04/05/19 0220  WBC 11.3*   < > 8.5 10.8* 7.3 7.8 7.8  HGB 11.0*   < > 8.6* 8.6* 8.5* 9.1* 9.0*  HCT 35.4*   < > 28.9* 28.7* 27.9* 29.7* 29.1*  PLT 291   < > 194 218  187 260 265  MCV 81.8   < > 84.8 85.4 84.5 83.4 82.2  MCH 25.4*   < > 25.2* 25.6* 25.8* 25.6* 25.4*  MCHC 31.1   < > 29.8* 30.0 30.5 30.6 30.9  RDW 16.2*   < > 16.8* 17.1* 17.0* 16.7* 16.7*  LYMPHSABS 3.4  --  0.4*  --  0.8 1.8 1.8  MONOABS 0.5  --  0.2  --  0.4 0.9 0.9  EOSABS 0.1  --  0.0  --  0.0 0.1 0.0  BASOSABS 0.1  --  0.0  --  0.0 0.0 0.0   < > = values in this interval not displayed.    Chemistries  Recent Labs  Lab 03/30/19 2124  04/01/19 0559 04/02/19 0441 04/02/19 1615 04/03/19 0415 04/04/19 0615 04/05/19 0220  NA 137   < > 140 142  --  143 142 142  K 4.5   < > 3.5 4.4  --  4.2 3.0* 3.2*  CL 103   < > 108 110  --  105 102 103  CO2 19*   < > 24 25  --  26 30 29   GLUCOSE 377*   < > 180* 169*  --  131* 112* 130*  BUN 19   < > 33* 29*  --  26* 28* 27*  CREATININE 1.22*   < > 0.86 0.53 0.51 0.57 0.56 0.58  CALCIUM 8.8*   < > 8.1* 8.8*  --  9.0 9.0 8.4*  MG  --    < > 2.0 2.3 1.9 1.7 1.6* 1.6*  AST 112*  --   --   --   --  34 23 24  ALT 56*  --   --   --   --  33 30 30  ALKPHOS 203*  --   --   --   --  109 109 97  BILITOT 1.0  --   --   --   --  0.8 0.8 0.8   < > = values in this interval not displayed.   ------------------------------------------------------------------------------------------------------------------ No results for input(s): CHOL, HDL, LDLCALC, TRIG, CHOLHDL, LDLDIRECT in the last 72 hours.  Lab Results  Component Value Date   HGBA1C 6.8 (H) 02/27/2019   ------------------------------------------------------------------------------------------------------------------ No results for input(s): TSH, T4TOTAL, T3FREE, THYROIDAB in the last 72 hours.  Invalid input(s): FREET3 ------------------------------------------------------------------------------------------------------------------ No results for input(s): VITAMINB12, FOLATE, FERRITIN, TIBC, IRON, RETICCTPCT in the last 72 hours.  Coagulation profile Recent Labs  Lab 03/30/19 2124  [name]  1.9*    Recent Labs    04/04/19 0615 04/05/19 0220  DDIMER 3.39* 2.79*    Cardiac Enzymes No results for input(s): CKMB, TROPONINI, MYOGLOBIN in the last 168 hours.  Invalid input(s): CK ------------------------------------------------------------------------------------------------------------------    Component Value Date/Time   BNP 841.6 (H) 04/05/2019 0220    Micro Results Recent Results (from the past 240 hour(s))  Blood Culture (routine x 2)     Status: None   Collection Time: 03/30/19  9:35 PM   Specimen: BLOOD  Result Value Ref Range Status   Specimen Description BLOOD LEFT ANTECUBITAL  Final   Special Requests   Final    BOTTLES DRAWN AEROBIC AND ANAEROBIC Blood Culture adequate volume   Culture   Final    NO GROWTH 5 DAYS Performed at [facility], [address] [place], [place] [address]    Report Status 04/04/2019 FINAL  Final  Blood Culture (routine x 2)     Status: Abnormal   Collection Time: 03/30/19  9:37 PM   Specimen: BLOOD  Result Value Ref Range Status   Specimen Description   Final    BLOOD BLOOD LEFT ARM Performed at [facility], [address] [place], [place] [address]    Special Requests   Final    BOTTLES DRAWN AEROBIC AND ANAEROBIC Blood Culture adequate volume Performed at [facility], [address] [place], [place] [address]    Culture  Setup Time   Final    GRAM POSITIVE COCCI IN BOTH AEROBIC AND ANAEROBIC BOTTLES CRITICAL RESULT CALLED TO, READ BACK BY AND VERIFIED WITH: Harrell LarkWALID NAZARI 03/31/2019 AT 2039 HS GRAM POSITIVE RODS ANAEROBIC BOTTLE ONLY CRITICAL RESULT CALLED TO, READ BACK BY AND VERIFIED WITH: DR Windell NorfolkGHIMIRE S [id number] AT 1252 BY CM Performed at [facility], [address] [place], [place] [address]    Culture (A)  Final    VEILLONELLA SPECIES Standardized susceptibility testing for this organism is not available. ROTHIA MUCILAGINOSA Standardized susceptibility testing for this  organism is not available.    Report Status 04/05/2019 FINAL  Final  MRSA PCR Screening     Status: None   Collection Time: 03/31/19  4:02 AM   Specimen: Nasopharyngeal  Result Value Ref Range Status   MRSA by PCR NEGATIVE NEGATIVE Final    Comment:        The GeneXpert MRSA Assay (FDA approved for NASAL specimens only), is one component of a comprehensive MRSA colonization surveillance program. It is not intended to diagnose MRSA infection nor to guide or monitor treatment for MRSA infections. Performed at [facility], [address] [place], [place] [address]   Culture, respiratory (non-expectorated)     Status: None   Collection Time: 03/31/19 12:28 PM   Specimen: Tracheal Aspirate; Respiratory  Result Value Ref Range Status   Specimen Description   Final    TRACHEAL ASPIRATE Performed at [facility], [address] [place], [place] [address]    Special Requests   Final    Normal Performed at [facility], [address] [place], [place] [address]    Gram Stain   Final    FEW WBC PRESENT, PREDOMINANTLY PMN RARE GRAM POSITIVE COCCI IN PAIRS RARE GRAM POSITIVE RODS    Culture   Final    Consistent with normal respiratory flora. Performed at [facility], [address], [place], [place] [address]    Report Status 04/02/2019 FINAL  Final  Urine culture     Status: None   Collection Time: 04/01/19  2:23 AM   Specimen: In/Out Cath Urine  Result Value Ref Range Status   Specimen Description   Final    IN/OUT CATH URINE Performed at [facility], [address] [place], [place] [address]    Special Requests   Final    NONE Performed at [facility], [address] [place], [place] [address]    Culture   Final    NO GROWTH Performed at [facility], [address] [place], [place] [address]    Report Status 04/02/2019 FINAL  Final  Culture, blood (routine x 2)     Status: None (Preliminary  result)   Collection Time: 04/04/19  2:15 PM   Specimen: BLOOD  Result Value Ref Range Status   Specimen Description   Final    BLOOD RIGHT ARM Performed at [facility], [address], [place], [place] [address]    Special Requests   Final    BOTTLES DRAWN AEROBIC ONLY Blood Culture adequate volume Performed at [facility], [address] [place], [place] [address]    Culture   Final    NO GROWTH < 24 HOURS Performed at [facility], [address], [place], [place] [address]    Report Status PENDING  Incomplete  Culture, blood (routine x 2)     Status: None (Preliminary result)   Collection Time: 04/04/19  4:25 PM   Specimen: BLOOD  Result Value Ref Range Status   Specimen Description   Final    BLOOD RIGHT ARM Performed at [facility], [address] [place], [place] [address]    Special Requests   Final    BOTTLES DRAWN AEROBIC ONLY Blood Culture adequate volume Performed at [facility], [address], [place], [place] [address]    Culture   Final    NO GROWTH < 24 HOURS Performed at [facility],  [address] [place], [place] [address]    Report Status PENDING  Incomplete    Radiology Reports Dg Chest Port 1 View  Result Date: 04/02/2019 CLINICAL DATA:  Shortness of breath. EXAM: PORTABLE CHEST 1 VIEW COMPARISON:  Radiograph yesterday. CT 02/27/2019 FINDINGS: Endotracheal and enteric tubes have been removed. Improved lung volumes. Left-sided pacemaker remains in place. Post aortic stent graft repair, unchanged in radiographic appearance. Unchanged cardiomegaly. Unchanged mediastinal contours. Mild vascular congestion without pulmonary edema. Minimal blunting of left costophrenic angle suggesting small effusion. Streaky bibasilar atelectasis. No pneumothorax. IMPRESSION: 1. Increasing vascular congestion, stable cardiomegaly. 2. Improved lung volumes after extubation. Streaky  bibasilar atelectasis. 3. Possible small left pleural effusion, increased. Electronically Signed   By: Narda Rutherford M.D.   On: 04/02/2019 18:21   Dg Chest Port 1 View  Result Date: 04/01/2019 CLINICAL DATA:  Acute respiratory failure. EXAM: PORTABLE CHEST 1 VIEW COMPARISON:  03/31/2019. FINDINGS: Endotracheal tube and NG tube in stable position. Cardiac pacer stable position. Aortic stent graft and vascular coils noted in stable position. Stable cardiomegaly. No pulmonary venous congestion. No focal infiltrate. Mild bibasilar subsegmental atelectasis again noted. No pleural effusion noted on today's exam. No pneumothorax. IMPRESSION: 1.  Endotracheal tube and NG tube in stable position. 2. Cardiac pacer in stable position. Aortic stent graft and vascular coils noted stable position. Stable cardiomegaly. No pulmonary venous congestion. 3. Mild bibasilar subsegmental atelectasis again noted. No pleural effusion noted on today's exam. Electronically Signed   By: Maisie Fus  Register   On: 04/01/2019 06:13   Dg Chest Port 1 View  Result Date: 03/31/2019 CLINICAL DATA:  Shortness of breath. EXAM: PORTABLE CHEST 1 VIEW COMPARISON:  Chest x-rays dated 03/30/2019 and 02/28/2019. FINDINGS: Improved aeration within the upper lobes bilaterally suggesting improved fluid status. Probable bibasilar atelectasis and/or small pleural effusions. Stable cardiomegaly. Endotracheal tube appears adequately positioned with tip at the level of the clavicles. Enteric tube passes below the diaphragm. Aortic stent appears stable in configuration. LEFT chest wall pacemaker/ICD apparatus appears stable. IMPRESSION: 1. Improved aeration within the upper lobes bilaterally suggesting improved fluid status. 2. Probable bibasilar atelectasis and/or small pleural effusions. 3. Stable cardiomegaly. Electronically Signed   By: Bary Richard M.D.   On: 03/31/2019 10:58   Dg Chest Portable 1 View  Result Date: 03/30/2019 CLINICAL DATA:   Shortness of breath, respiratory failure EXAM: PORTABLE CHEST 1 VIEW COMPARISON:  02/28/2019 FINDINGS: Left AICD remains in place, unchanged. The endotracheal tube is 6 cm above the carina. NG tube enters the stomach. Prior aortic stent graft. Airspace disease bilaterally, most pronounced in the upper lobes and perihilar regions. Given the upper lobe predominance, favor infection over edema. No visible effusions. No acute bony abnormality. IMPRESSION: Cardiomegaly, vascular congestion. Bilateral upper lobe and perihilar opacities concerning for pneumonia. Electronically Signed   By: Charlett Nose M.D.   On: 03/30/2019 21:39

## 2019-04-05 NOTE — Progress Notes (Signed)
Patient discussed with primary team. 68 yo female extensive cardiac history followed at [facility] with history of CAD/ICM with LVEF 20-25%, has ICD. Admitted with COVID, while off telemetry washing up reported her ICD fired. IN discussions with primary team tele has shown just SR and ventricular ectopy. No sustained ventricular arrhythmias have been captured. Mg was 1.6 and K 3.2 this AM, has received 2g of IV Mg and 3mEq of KCl today.   Discussed with Dr Caryl Comes, he is contacting [employer] to see what arrangements are available for ICD interrogation. I suspect if appropriate shock VT was likely induced due to ICM/chronic systolic HF with electrolyte abnormalities and increased systemic stress from [place]. She is not having active chest pain by report, her EKG shows SR with chronic ST/T changes  Can cycle trops to eval degree of elevation, I suspect certaintly some degree of elevatoin and will need to be interpreted in context, keep K at 4 and Mg at 2, she is on coreg 25mg  bid already titrrated up this admission. Will follow up once we are able to have device checked.    Carlyle Dolly MD

## 2019-04-05 NOTE — Discharge Instructions (Addendum)
Follow with Primary MD Frazier Richards, MD and your primary cardiologist in 7 days   Get CBC, CMP, 2 view Chest X ray -  checked next visit within 1 week by Primary MD   Activity: As tolerated with Full fall precautions use walker/cane & assistance as needed  Disposition Home    Diet: Heart Healthy Low Carb diet, strict 1.5 L/day total fluid restriction.  Check your Weight same time everyday, if you gain over 2 pounds, or you develop in leg swelling, experience more shortness of breath or chest pain, call your Primary MD immediately. Follow Cardiac Low Salt Diet and 1.5 lit/day fluid restriction.  Special Instructions: If you have smoked or chewed Tobacco  in the last 2 yrs please stop smoking, stop any regular Alcohol  and or any Recreational drug use.  On your next visit with your primary care physician please Get Medicines reviewed and adjusted.  Please request your Prim.MD to go over all Hospital Tests and Procedure/Radiological results at the follow up, please get all Hospital records sent to your Prim MD by signing hospital release before you go home.  If you experience worsening of your admission symptoms, develop shortness of breath, life threatening emergency, suicidal or homicidal thoughts you must seek medical attention immediately by calling 911 or calling your MD immediately  if symptoms less severe.  You Must read complete instructions/literature along with all the possible adverse reactions/side effects for all the Medicines you take and that have been prescribed to you. Take any new Medicines after you have completely understood and accpet all the possible adverse reactions/side effects.          Person Under Monitoring Name: Desiree Mccall  Location: [place] [address]   Infection Prevention Recommendations for Individuals Confirmed to have, or Being Evaluated for, 2019 Novel Coronavirus (COVID-19) Infection Who Receive Care at Home  Individuals  who are confirmed to have, or are being evaluated for, COVID-19 should follow the prevention steps below until a healthcare provider or local or state health department says they can return to normal activities.  Stay home except to get medical care You should restrict activities outside your home, except for getting medical care. Do not go to work, school, or public areas, and do not use public transportation or taxis.  Call ahead before visiting your doctor Before your medical appointment, call the healthcare provider and tell them that you have, or are being evaluated for, COVID-19 infection. This will help the healthcare providers office take steps to keep other people from getting infected. Ask your healthcare provider to call the local or state health department.  Monitor your symptoms Seek prompt medical attention if your illness is worsening (e.g., difficulty breathing). Before going to your medical appointment, call the healthcare provider and tell them that you have, or are being evaluated for, COVID-19 infection. Ask your healthcare provider to call the local or state health department.  Wear a facemask You should wear a facemask that covers your nose and mouth when you are in the same room with other people and when you visit a healthcare provider. People who live with or visit you should also wear a facemask while they are in the same room with you.  Separate yourself from other people in your home As much as possible, you should stay in a different room from other people in your home. Also, you should use a separate bathroom, if available.  Avoid sharing household items You  should not share dishes, drinking glasses, cups, eating utensils, towels, bedding, or other items with other people in your home. After using these items, you should wash them thoroughly with soap and water.  Cover your coughs and sneezes Cover your mouth and nose with a tissue when you cough or  sneeze, or you can cough or sneeze into your sleeve. Throw used tissues in a lined trash can, and immediately wash your hands with soap and water for at least 20 seconds or use an alcohol-based hand rub.  Wash your [employer] your hands often and thoroughly with soap and water for at least 20 seconds. You can use an alcohol-based hand sanitizer if soap and water are not available and if your hands are not visibly dirty. Avoid touching your eyes, nose, and mouth with unwashed hands.   Prevention Steps for Caregivers and Household Members of Individuals Confirmed to have, or Being Evaluated for, COVID-19 Infection Being Cared for in the Home  If you live with, or provide care at home for, a person confirmed to have, or being evaluated for, COVID-19 infection please follow these guidelines to prevent infection:  Follow healthcare providers instructions Make sure that you understand and can help the patient follow any healthcare provider instructions for all care.  Provide for the patients basic needs You should help the patient with basic needs in the home and provide support for getting groceries, prescriptions, and other personal needs.  Monitor the patients symptoms If they are getting sicker, call his or her medical provider and tell them that the patient has, or is being evaluated for, COVID-19 infection. This will help the healthcare providers office take steps to keep other people from getting infected. Ask the healthcare provider to call the local or state health department.  Limit the number of people who have contact with the patient  If possible, have only one caregiver for the patient.  Other household members should stay in another home or place of residence. If this is not possible, they should stay  in another room, or be separated from the patient as much as possible. Use a separate bathroom, if available.  Restrict visitors who do not have an essential need to be  in the home.  Keep older adults, very young children, and other sick people away from the patient Keep older adults, very young children, and those who have compromised immune systems or chronic health conditions away from the patient. This includes people with chronic heart, lung, or kidney conditions, diabetes, and cancer.  Ensure good ventilation Make sure that shared spaces in the home have good air flow, such as from an air conditioner or an opened window, weather permitting.  Wash your hands often  Wash your hands often and thoroughly with soap and water for at least 20 seconds. You can use an alcohol based hand sanitizer if soap and water are not available and if your hands are not visibly dirty.  Avoid touching your eyes, nose, and mouth with unwashed hands.  Use disposable paper towels to dry your hands. If not available, use dedicated cloth towels and replace them when they become wet.  Wear a facemask and gloves  Wear a disposable facemask at all times in the room and gloves when you touch or have contact with the patients blood, body fluids, and/or secretions or excretions, such as sweat, saliva, sputum, nasal mucus, vomit, urine, or feces.  Ensure the mask fits over your nose and mouth tightly, and do not  touch it during use.  Throw out disposable facemasks and gloves after using them. Do not reuse.  Wash your hands immediately after removing your facemask and gloves.  If your personal clothing becomes contaminated, carefully remove clothing and launder. Wash your hands after handling contaminated clothing.  Place all used disposable facemasks, gloves, and other waste in a lined container before disposing them with other household waste.  Remove gloves and wash your hands immediately after handling these items.  Do not share dishes, glasses, or other household items with the patient  Avoid sharing household items. You should not share dishes, drinking glasses, cups,  eating utensils, towels, bedding, or other items with a patient who is confirmed to have, or being evaluated for, COVID-19 infection.  After the person uses these items, you should wash them thoroughly with soap and water.  Wash laundry thoroughly  Immediately remove and wash clothes or bedding that have blood, body fluids, and/or secretions or excretions, such as sweat, saliva, sputum, nasal mucus, vomit, urine, or feces, on them.  Wear gloves when handling laundry from the patient.  Read and follow directions on labels of laundry or clothing items and detergent. In general, wash and dry with the warmest temperatures recommended on the label.  Clean all areas the individual has used often  Clean all touchable surfaces, such as counters, tabletops, doorknobs, bathroom fixtures, toilets, phones, keyboards, tablets, and bedside tables, every day. Also, clean any surfaces that may have blood, body fluids, and/or secretions or excretions on them.  Wear gloves when cleaning surfaces the patient has come in contact with.  Use a diluted bleach solution (e.g., dilute bleach with 1 part bleach and 10 parts water) or a household disinfectant with a label that says EPA-registered for coronaviruses. To make a bleach solution at home, add 1 tablespoon of bleach to 1 quart (4 cups) of water. For a larger supply, add  cup of bleach to 1 gallon (16 cups) of water.  Read labels of cleaning products and follow recommendations provided on product labels. Labels contain instructions for safe and effective use of the cleaning product including precautions you should take when applying the product, such as wearing gloves or eye protection and making sure you have good ventilation during use of the product.  Remove gloves and wash hands immediately after cleaning.  Monitor yourself for signs and symptoms of illness Caregivers and household members are considered close contacts, should monitor their health, and  will be asked to limit movement outside of the home to the extent possible. Follow the monitoring steps for close contacts listed on the symptom monitoring form.   ? If you have additional questions, contact your local health department or call the epidemiologist on call at [phone] (available 24/7). ? This guidance is subject to change. For the most up-to-date guidance from [facility], please refer to their website: [email or web address]        Elnita Maxwell with [facility] [phone] please contact if you have any questions or concerns.    Information on my medicine - XARELTO (Rivaroxaban)  This medication education was reviewed with me or my healthcare representative as part of my discharge preparation.    Why was Xarelto prescribed for you? Xarelto was prescribed for you to reduce the risk of a blood clot forming that can cause a stroke if you have a medical condition called atrial fibrillation (a type of irregular heartbeat).  What do you need to know about xarelto ? Take your Xarelto ONCE DAILY at the same  time every day with your evening meal. If you have difficulty swallowing the tablet whole, you may crush it and mix in applesauce just prior to taking your dose.  Take Xarelto exactly as prescribed by your doctor and DO NOT stop taking Xarelto without talking to the doctor who prescribed the medication.  Stopping without other stroke prevention medication to take the place of Xarelto may increase your risk of developing a clot that causes a stroke.  Refill your prescription before you run out.  After discharge, you should have regular check-up appointments with your healthcare provider that is prescribing your Xarelto.  In the future your dose may need to be changed if your kidney function or weight changes by a significant amount.  What do you do if you miss a dose? If you are taking Xarelto ONCE DAILY and you miss a  dose, take it as soon as you remember on the same day then continue your regularly scheduled once daily regimen the next day. Do not take two doses of Xarelto at the same time or on the same day.   Important Safety Information A possible side effect of Xarelto is bleeding. You should call your healthcare provider right away if you experience any of the following: ? Bleeding from an injury or your nose that does not stop. ? Unusual colored urine (red or dark brown) or unusual colored stools (red or black). ? Unusual bruising for unknown reasons. ? A serious fall or if you hit your head (even if there is no bleeding).  Some medicines may interact with Xarelto and might increase your risk of bleeding while on Xarelto. To help avoid this, consult your healthcare provider or pharmacist prior to using any new prescription or non-prescription medications, including herbals, vitamins, non-steroidal anti-inflammatory drugs (NSAIDs) and supplements.  This website has more information on Xarelto: [email or web address].

## 2019-04-05 NOTE — Progress Notes (Addendum)
Patient felt her ICD shocking her while she was ambulating on the room, she was not connected to the telemetry during that incidence, currently vital signs are stable, she feels comfortable, with no complaints, she is back on telemetry sinus rhythm with some ectopy, she had some electrolyte balance earlier today with potassium 3.2, magnesium 1.6, which was repleted, thinks that potassium and magnesium, she is already on beta-blockers with known EF of 25%. -Have discussed with cardiology Dr. Harl Bowie, he will discuss with EP regarding further recommendation, and he will try to arrange for AICD interrogation(he will try to find appropriate procedures given COVID-19 positive status), and will provide further recommendation. Phillips Climes MD

## 2019-04-06 DIAGNOSIS — E119 Type 2 diabetes mellitus without complications: Secondary | ICD-10-CM

## 2019-04-06 LAB — C-REACTIVE PROTEIN: CRP: 1.4 mg/dL — ABNORMAL HIGH (ref <1.0)

## 2019-04-06 LAB — COMPREHENSIVE METABOLIC PANEL
ALT: 35 U/L (ref 0–44)
AST: 26 U/L (ref 15–41)
Albumin: 3.2 g/dL — ABNORMAL LOW (ref 3.5–5.0)
Alkaline Phosphatase: 103 U/L (ref 38–126)
Anion gap: 10 (ref 5–15)
BUN: 29 mg/dL — ABNORMAL HIGH (ref 8–23)
CO2: 30 mmol/L (ref 22–32)
Calcium: 8.6 mg/dL — ABNORMAL LOW (ref 8.9–10.3)
Chloride: 99 mmol/L (ref 98–111)
Creatinine, Ser: 0.63 mg/dL (ref 0.44–1.00)
GFR calc Af Amer: >60 mL/min (ref >60)
GFR calc non Af Amer: >60 mL/min (ref >60)
Glucose, Bld: 281 mg/dL — ABNORMAL HIGH (ref 70–99)
Potassium: 4.1 mmol/L (ref 3.5–5.1)
Sodium: 139 mmol/L (ref 135–145)
Total Bilirubin: 0.7 mg/dL (ref 0.3–1.2)
Total Protein: 6 g/dL — ABNORMAL LOW (ref 6.5–8.1)

## 2019-04-06 LAB — CBC WITH DIFFERENTIAL/PLATELET
Abs Immature Granulocytes: 0.07 10*3/uL (ref 0.00–0.07)
Basophils Absolute: 0 10*3/uL (ref 0.0–0.1)
Basophils Relative: 0 %
Eosinophils Absolute: 0.1 10*3/uL (ref 0.0–0.5)
Eosinophils Relative: 1 %
HCT: 30.4 % — ABNORMAL LOW (ref 36.0–46.0)
Hemoglobin: 9.4 g/dL — ABNORMAL LOW (ref 12.0–15.0)
Immature Granulocytes: 1 %
Lymphocytes Relative: 21 %
Lymphs Abs: 1.9 10*3/uL (ref 0.7–4.0)
MCH: 25.7 pg — ABNORMAL LOW (ref 26.0–34.0)
MCHC: 30.9 g/dL (ref 30.0–36.0)
MCV: 83.1 fL (ref 80.0–100.0)
Monocytes Absolute: 1 10*3/uL (ref 0.1–1.0)
Monocytes Relative: 11 %
Neutro Abs: 6.1 10*3/uL (ref 1.7–7.7)
Neutrophils Relative %: 66 %
Platelets: 291 10*3/uL (ref 150–400)
RBC: 3.66 MIL/uL — ABNORMAL LOW (ref 3.87–5.11)
RDW: 17.2 % — ABNORMAL HIGH (ref 11.5–15.5)
WBC: 9.1 10*3/uL (ref 4.0–10.5)
nRBC: 0 % (ref 0.0–0.2)

## 2019-04-06 LAB — D-DIMER, QUANTITATIVE: D-Dimer, Quant: 2.86 ug/mL-FEU — ABNORMAL HIGH (ref 0.00–0.50)

## 2019-04-06 LAB — MAGNESIUM: Magnesium: 2.5 mg/dL — ABNORMAL HIGH (ref 1.7–2.4)

## 2019-04-06 LAB — GLUCOSE, CAPILLARY
Glucose-Capillary: 111 mg/dL — ABNORMAL HIGH (ref 70–99)
Glucose-Capillary: 166 mg/dL — ABNORMAL HIGH (ref 70–99)
Glucose-Capillary: 282 mg/dL — ABNORMAL HIGH (ref 70–99)
Glucose-Capillary: 289 mg/dL — ABNORMAL HIGH (ref 70–99)

## 2019-04-06 LAB — PROCALCITONIN: Procalcitonin: 0.13 ng/mL

## 2019-04-06 LAB — BRAIN NATRIURETIC PEPTIDE: B Natriuretic Peptide: 474.6 pg/mL — ABNORMAL HIGH (ref 0.0–100.0)

## 2019-04-06 MED ORDER — FLUOXETINE HCL 20 MG PO CAPS
40.0000 mg | ORAL_CAPSULE | Freq: Every day | ORAL | Status: DC
  Administered 2019-04-06 – 2019-04-07 (×2): 40 mg via ORAL
  Filled 2019-04-06 (×2): qty 2

## 2019-04-06 MED ORDER — CARVEDILOL 12.5 MG PO TABS
25.0000 mg | ORAL_TABLET | Freq: Two times a day (BID) | ORAL | Status: DC
  Administered 2019-04-06 – 2019-04-07 (×3): 25 mg via ORAL
  Filled 2019-04-06 (×3): qty 2

## 2019-04-06 NOTE — Progress Notes (Signed)
Mild SOB, Hypertensive, frequent pvc this morning : contacted hospitalist , coreg given early

## 2019-04-06 NOTE — Progress Notes (Addendum)
PROGRESS NOTE                                                                                                                                                                                                             Patient Demographics:    Desiree Mccall, is a 68 y.o. female, DOB - [birth year], CZY:[id number]  Outpatient Primary MD for the patient is Sherril Cong, Hattie Perch, MD    LOS - 4  Admit date - 04/02/2019    CC - SOB     Brief Narrative  Patient is a 68 y.o. 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  Patient was treated with steroids, remdesivir, antibiotics, diuretics-extubated-and subsequently transferred to [facility].    Subjective:    Desiree Mccall today has, No headache, No chest pain, No abdominal pain - No Nausea, No new weakness tingling or numbness, no Cough - SOB.     Assessment  & Plan :     1. Acute Hypoxic Resp. Failure due to Acute Covid 19 Viral Pneumonitis during the ongoing 2020 Covid 19 Pandemic - Clinically improved- suspect main physiology behind hypoxia was decompensated systolic heart failure-currently well compensated.  She remains on empiric doxycycline and remdesivir/steroid for pneumonia.  Suspect could be weaned off oxygen soon, await recommendations from physical therapy.  Continue tapering down oxygen and steroids.  Increase activity.  Encouraged to sit up in chair and daytime and use flutter valve and I-S for  pulmonary toiletry.  SpO2: 100 % O2 Flow Rate (L/min): 2 L/min   COVID-19 Medications: Steroids: 11/28>> Remdesivir: 11/29>>/23 Actemra: Not given Convalescent Plasma: Not given  Hepatic Function Latest Ref Rng & Units 04/06/2019 04/05/2019 04/04/2019  Total Protein 6.5 - 8.1 g/dL 6.0(L) 5.6(L) 6.5  Albumin 3.5 - 5.0 g/dL 3.2(L) 2.9(L) 3.5  AST 15 - 41 U/L 26 24 23   ALT 0 - 44 U/L 35 30 30  Alk Phosphatase 38 -  126 U/L 103 97 109  Total Bilirubin 0.3 - 1.2 mg/dL 0.7 0.8 0.8  Bilirubin, Direct 0.0 - 0.3 mg/dL - - -    Lab Results  Component Value Date   [place] NEGATIVE 02/27/2019    Recent Labs  Lab 03/30/19 2124  03/30/19 2139  04/02/19 0441 04/02/19 1615 04/03/19 0415 04/04/19 0615 04/04/19 0630 04/04/19 0632 04/05/19 0220 04/06/19 0110  CRP  --   --   --    < >  --  10.7* 7.7*  --  4.0*  --  2.0* 1.4*  DDIMER  --   --   --   --   --  4.00* 2.80* 3.39*  --   --  2.79* 2.86*  BNP  --    < >  --    < >  --  896.3* 728.4*  --   --  588.8* 841.6* 474.6*  PROCALCITON  --   --  <0.10   < > 3.03 2.23 1.83 0.76  --   --   --  0.13  FERRITIN  --   --  39  --   --   --   --   --   --   --   --   --   AST 112*  --   --   --   --   --  34 23  --   --  24 26  ALT 56*  --   --   --   --   --  33 30  --   --  30 35   < > = values in this interval not displayed.    Gram positive cocci  and veillonella bacteremia: Await final culture results-Case was discussed with infectious disease-Dr. Baxter Flattery yesterday-recommendations are to repeat blood cultures-and if negative for more than 24 hours-to discharge on doxycycline for total of 10 days.  Note patient has an AICD in place, stable procalcitonin and repeat cultures negative thus far.  If negative likely discharge home on 04/07/2019.  Acute on chronic combined systolic and diastolic heart failure: Euvolemic-continue intravenous Lasix.  Remains on Coreg, ARB and Aldactone.   AICD in place-she follows with cardiology at [facility].  Patient  had questionable episode where her AICD likely fired per patient, EP consulted AICD to be interrogated on 04/06/2019.  PAF: Had a burst of narrow complex tachycardia a few days back-currently stable on Coreg 12.5 mg twice daily, remains on Xarelto.    CAD: No anginal symptoms-remains on dual antiplatelets, statin and Coreg.  Patient confirms that she is indeed on dual antiplatelet agents-have asked her to follow-up with her cardiologist to see if she still needs to be on both of these agents (in addition to Xarelto).  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  Obesity: BMI 39, follow with PCP for weight loss.  DM-2: CBG stable-continue Lantus 15 units daily and SSI.  Follow and adjust  Lab Results  Component Value Date   HGBA1C 6.8 (H) 02/27/2019   CBG (last 3)  Recent Labs    04/05/19 2056 04/05/19 2244 04/06/19 0821  GLUCAP 313*  264* 111*     Condition - Fair  Family Communication  :  Daughter 12/5  Code Status :  Full  Diet :   Diet Order            Diet - low sodium heart healthy        Diet Carb Modified        Diet heart healthy/carb modified Room service appropriate? Yes; Fluid consistency: Thin; Fluid restriction: 1500 mL Fluid  Diet effective now               Disposition Plan  :  Home in 1-2 days  Consults  :  EP  Procedures  :     PUD Prophylaxis :    DVT Prophylaxis  :  Xaralto  Lab Results  Component Value Date   PLT 291 04/06/2019    Inpatient Medications  Scheduled Meds: . aspirin EC  81 mg Oral  Daily  . atorvastatin  40 mg Oral q1800  . carvedilol  25 mg Oral BID WC  . Chlorhexidine Gluconate Cloth  6 each Topical Daily  . clopidogrel  75 mg Oral Daily  . dexamethasone  4 mg Oral Daily  . doxycycline  100 mg Oral Q12H  . FLUoxetine  40 mg Oral Daily  . furosemide  40 mg Intravenous Daily  . insulin aspart  0-5 Units Subcutaneous QHS  . insulin aspart  0-9 Units Subcutaneous TID WC  . insulin glargine  15 Units Subcutaneous BID  . losartan  25 mg Oral Daily  . pantoprazole  40 mg Oral Daily  . rivaroxaban  20 mg Oral Q supper  . spironolactone  25 mg Oral Daily   Continuous Infusions: PRN Meds:.acetaminophen, albuterol, bisacodyl, ondansetron **OR** ondansetron (ZOFRAN) IV  Antibiotics  :    Anti-infectives (From admission, onward)   Start     Dose/Rate Route Frequency Ordered Stop   04/04/19 0000  doxycycline (VIBRA-TABS) 100 MG tablet     100 mg Oral 2 times daily 04/04/19 0951     04/03/19 1000  remdesivir 100 mg in sodium chloride 0.9 % 250 mL IVPB     100 mg 500 mL/hr over 30 Minutes Intravenous Daily 04/02/19 1703 04/04/19 1758   04/02/19 1800  doxycycline (VIBRA-TABS) tablet 100 mg     100 mg Oral Every 12 hours 04/02/19 1645         Time Spent in minutes  30   Lala Lund M.D on 04/06/2019 at 9:45 AM  To page go to [email or web address] - password [facility]  [facility] -  Office  [phone]    See all Orders from today for further details    Objective:   Vitals:   04/06/19 0444 04/06/19 0600 04/06/19 0625 04/06/19 0725  BP: (!) 141/90 (!) 137/103 (!) 173/95 (!) 170/83  Pulse: (!) 58 (!) 42 64 61  Resp: 15 13 13 20   Temp: 98 F (36.7 C)     TempSrc: Oral     SpO2:  100% 100% 100%    Wt Readings from Last 3 Encounters:  04/02/19 103.6 kg  03/03/19 100.8 kg  06/05/18 103.9 kg     Intake/Output Summary (Last 24 hours) at 04/06/2019 0945 Last data filed at 04/05/2019 2000 Gross per 24 hour  Intake 770 ml  Output -  Net 770 ml      Physical Exam  Awake Alert, Oriented X 3, No new F.N deficits, Normal affect [place].AT,PERRAL Supple Neck,No JVD, No cervical lymphadenopathy appriciated.  Symmetrical Chest wall movement, Good air movement bilaterally, CTAB RRR,No Gallops,Rubs or new Murmurs, No Parasternal Heave +ve B.Sounds, Abd Soft, No tenderness, No organomegaly appriciated, No rebound - guarding or rigidity. No Cyanosis, Clubbing or edema, No new Rash or bruise       Data Review:    CBC Recent Labs  Lab 04/02/19 0441 04/02/19 1615 04/03/19 0415 04/04/19 0615 04/05/19 0220 04/06/19 0110  WBC 8.5 10.8* 7.3 7.8 7.8 9.1  HGB 8.6* 8.6* 8.5* 9.1* 9.0* 9.4*  HCT 28.9* 28.7* 27.9* 29.7* 29.1* 30.4*  PLT 194 218 187 260 265 291  MCV 84.8 85.4 84.5 83.4 82.2 83.1  MCH 25.2* 25.6* 25.8* 25.6* 25.4* 25.7*  MCHC 29.8* 30.0 30.5 30.6 30.9 30.9  RDW 16.8* 17.1* 17.0* 16.7* 16.7* 17.2*  LYMPHSABS 0.4*  --  0.8 1.8 1.8 1.9  MONOABS 0.2  --  0.4 0.9 0.9 1.0  EOSABS 0.0  --  0.0 0.1 0.0 0.1  BASOSABS 0.0  --  0.0 0.0 0.0 0.0    Chemistries  Recent Labs  Lab 03/30/19 2124  04/02/19 0441 04/02/19 1615 04/03/19 0415 04/04/19 0615 04/05/19 0220 04/05/19 1919 04/06/19 0110  NA 137   < > 142  --  143 142 142  --  139  K 4.5   < > 4.4  --  4.2 3.0* 3.2* 4.3 4.1  CL 103   < > 110  --  105 102 103  --  99  CO2 19*   < > 25  --  26 30 29   --  30  GLUCOSE 377*   < > 169*  --  131* 112* 130*  --  281*  BUN 19   < > 29*  --  26* 28* 27*  --  29*  CREATININE 1.22*   < > 0.53 0.51 0.57 0.56 0.58  --  0.63  CALCIUM 8.8*   < > 8.8*  --  9.0 9.0 8.4*  --  8.6*  MG  --    < > 2.3 1.9 1.7 1.6* 1.6* 1.6* 2.5*  AST 112*  --   --   --  34 23 24  --  26  ALT 56*  --   --   --  33 30 30  --  35  ALKPHOS 203*  --   --   --  109 109 97  --  103  BILITOT 1.0  --   --   --  0.8 0.8 0.8  --  0.7   < > = values in this interval not displayed.    ------------------------------------------------------------------------------------------------------------------ No results for input(s): CHOL, HDL, LDLCALC, TRIG, CHOLHDL, LDLDIRECT in the last 72 hours.  Lab Results  Component Value Date   HGBA1C 6.8 (H) 02/27/2019   ------------------------------------------------------------------------------------------------------------------ No results for input(s): TSH, T4TOTAL, T3FREE, THYROIDAB in the last 72 hours.  Invalid input(s): FREET3  Cardiac Enzymes No results for input(s): CKMB, TROPONINI, MYOGLOBIN in the last 168 hours.  Invalid input(s): CK ------------------------------------------------------------------------------------------------------------------    Component Value Date/Time   BNP 474.6 (H) 04/06/2019 0110    Micro Results Recent Results (from the past 240 hour(s))  Blood Culture (routine x 2)     Status: None   Collection Time: 03/30/19  9:35 PM   Specimen: BLOOD  Result Value Ref Range Status   Specimen Description BLOOD LEFT ANTECUBITAL  Final   Special Requests   Final    BOTTLES DRAWN AEROBIC AND ANAEROBIC Blood Culture adequate volume  Culture   Final    NO GROWTH 5 DAYS Performed at [facility], [address] [place], [place] [address]    Report Status 04/04/2019 FINAL  Final  Blood Culture (routine x 2)     Status: Abnormal   Collection Time: 03/30/19  9:37 PM   Specimen: BLOOD  Result Value Ref Range Status   Specimen Description   Final    BLOOD BLOOD LEFT ARM Performed at [facility], [address] [place], [place] [address]    Special Requests   Final    BOTTLES DRAWN AEROBIC AND ANAEROBIC Blood Culture adequate volume Performed at [facility], [address] [place], [place] [address]    Culture  Setup Time   Final    GRAM POSITIVE COCCI IN BOTH AEROBIC AND ANAEROBIC BOTTLES CRITICAL RESULT CALLED TO, READ BACK BY AND VERIFIED WITH: Lance Coon NAZARI  03/31/2019 AT 2039 HS GRAM POSITIVE RODS ANAEROBIC BOTTLE ONLY CRITICAL RESULT CALLED TO, READ BACK BY AND VERIFIED WITH: DR Nena Alexander [id number] AT 1252 BY CM Performed at [facility], [place] [address], [place], [place] [address]    Culture (A)  Final    VEILLONELLA SPECIES Standardized susceptibility testing for this organism is not available. ROTHIA MUCILAGINOSA Standardized susceptibility testing for this organism is not available.    Report Status 04/05/2019 FINAL  Final  MRSA PCR Screening     Status: None   Collection Time: 03/31/19  4:02 AM   Specimen: Nasopharyngeal  Result Value Ref Range Status   MRSA by PCR NEGATIVE NEGATIVE Final    Comment:        The GeneXpert MRSA Assay (FDA approved for NASAL specimens only), is one component of a comprehensive MRSA colonization surveillance program. It is not intended to diagnose MRSA infection nor to guide or monitor treatment for MRSA infections. Performed at [facility], [address] [place], [place] [address]   Culture, respiratory (non-expectorated)     Status: None   Collection Time: 03/31/19 12:28 PM   Specimen: Tracheal Aspirate; Respiratory  Result Value Ref Range Status   Specimen Description   Final    TRACHEAL ASPIRATE Performed at [facility], [address] [place], [place] [address]    Special Requests   Final    Normal Performed at [facility], [address] [place], [place] [address]    Gram Stain   Final    FEW WBC PRESENT, PREDOMINANTLY PMN RARE GRAM POSITIVE COCCI IN PAIRS RARE GRAM POSITIVE RODS    Culture   Final    Consistent with normal respiratory flora. Performed at [facility], [place] [address] [place], [place] [address]    Report Status 04/02/2019 FINAL  Final  Urine culture     Status: None   Collection Time: 04/01/19  2:23 AM   Specimen: In/Out Cath Urine  Result Value Ref Range Status   Specimen Description   Final    IN/OUT CATH  URINE Performed at [facility], [address] [place], [place] [address]    Special Requests   Final    NONE Performed at [facility], [address], [place], [place] [address]    Culture   Final    NO GROWTH Performed at [facility], [place] [address], [place], [place] [address]    Report Status 04/02/2019 FINAL  Final  Culture, blood (routine x 2)     Status: None (Preliminary  result)   Collection Time: 04/04/19  2:15 PM   Specimen: BLOOD  Result Value Ref Range Status   Specimen Description   Final    BLOOD RIGHT ARM Performed at [place] [address], [place], [place] [address]    Special Requests   Final    BOTTLES DRAWN AEROBIC ONLY Blood Culture adequate volume Performed at [place] [address], [place], [place] [address]    Culture   Final    NO GROWTH < 24 HOURS Performed at [place] [address] [place], [place] [address]    Report Status PENDING  Incomplete  Culture, blood (routine x 2)     Status: None (Preliminary result)   Collection Time: 04/04/19  4:25 PM   Specimen: BLOOD  Result Value Ref Range Status   Specimen Description   Final    BLOOD RIGHT ARM Performed at [place] [address] [place], [place] [address]    Special Requests   Final    BOTTLES DRAWN AEROBIC ONLY Blood Culture adequate volume Performed at [place] [address], [place], [place] [address]    Culture   Final    NO GROWTH < 24 HOURS Performed at [place] [address] [place], [place] [address]    Report Status PENDING  Incomplete    Radiology Reports Dg Chest Port 1 View  Result Date: 04/02/2019 CLINICAL DATA:  Shortness of breath. EXAM: PORTABLE CHEST 1 VIEW COMPARISON:  Radiograph yesterday. CT 02/27/2019 FINDINGS: Endotracheal and enteric tubes have been removed. Improved lung volumes. Left-sided pacemaker remains in  place. Post aortic stent graft repair, unchanged in radiographic appearance. Unchanged cardiomegaly. Unchanged mediastinal contours. Mild vascular congestion without pulmonary edema. Minimal blunting of left costophrenic angle suggesting small effusion. Streaky bibasilar atelectasis. No pneumothorax. IMPRESSION: 1. Increasing vascular congestion, stable cardiomegaly. 2. Improved lung volumes after extubation. Streaky bibasilar atelectasis. 3. Possible small left pleural effusion, increased. Electronically Signed   By: Keith Rake M.D.   On: 04/02/2019 18:21   Dg Chest Port 1 View  Result Date: 04/01/2019 CLINICAL DATA:  Acute respiratory failure. EXAM: PORTABLE CHEST 1 VIEW COMPARISON:  03/31/2019. FINDINGS: Endotracheal tube and NG tube in stable position. Cardiac pacer stable position. Aortic stent graft and vascular coils noted in stable position. Stable cardiomegaly. No pulmonary venous congestion. No focal infiltrate. Mild bibasilar subsegmental atelectasis again noted. No pleural effusion noted on today's exam. No pneumothorax. IMPRESSION: 1.  Endotracheal tube and NG tube in stable position. 2. Cardiac pacer in stable position. Aortic stent graft and vascular coils noted stable position. Stable cardiomegaly. No pulmonary venous congestion. 3. Mild bibasilar subsegmental atelectasis again noted. No pleural effusion noted on today's exam. Electronically Signed   By: Marcello Moores  Register   On: 04/01/2019 06:13   Dg Chest Port 1 View  Result Date: 03/31/2019 CLINICAL DATA:  Shortness of breath. EXAM: PORTABLE CHEST 1 VIEW COMPARISON:  Chest x-rays dated 03/30/2019 and 02/28/2019. FINDINGS: Improved aeration within the upper lobes bilaterally suggesting improved fluid status. Probable bibasilar atelectasis and/or small pleural effusions. Stable cardiomegaly. Endotracheal tube appears adequately positioned with tip at the level of the clavicles. Enteric tube passes below the diaphragm. Aortic stent  appears stable in configuration. LEFT chest wall pacemaker/ICD apparatus appears stable. IMPRESSION: 1. Improved aeration within the upper lobes bilaterally suggesting improved fluid status. 2. Probable bibasilar atelectasis and/or small pleural effusions. 3. Stable cardiomegaly. Electronically Signed  By: Franki Cabot M.D.   On: 03/31/2019 10:58   Dg Chest Portable 1 View  Result Date: 03/30/2019 CLINICAL DATA:  Shortness of breath, respiratory failure EXAM: PORTABLE CHEST 1 VIEW COMPARISON:  02/28/2019 FINDINGS: Left AICD remains in place, unchanged. The endotracheal tube is 6 cm above the carina. NG tube enters the stomach. Prior aortic stent graft. Airspace disease bilaterally, most pronounced in the upper lobes and perihilar regions. Given the upper lobe predominance, favor infection over edema. No visible effusions. No acute bony abnormality. IMPRESSION: Cardiomegaly, vascular congestion. Bilateral upper lobe and perihilar opacities concerning for pneumonia. Electronically Signed   By: Rolm Baptise M.D.   On: 03/30/2019 21:39

## 2019-04-07 LAB — COMPREHENSIVE METABOLIC PANEL
ALT: 33 U/L (ref 0–44)
AST: 22 U/L (ref 15–41)
Albumin: 3.3 g/dL — ABNORMAL LOW (ref 3.5–5.0)
Alkaline Phosphatase: 89 U/L (ref 38–126)
Anion gap: 8 (ref 5–15)
BUN: 19 mg/dL (ref 8–23)
CO2: 33 mmol/L — ABNORMAL HIGH (ref 22–32)
Calcium: 9 mg/dL (ref 8.9–10.3)
Chloride: 101 mmol/L (ref 98–111)
Creatinine, Ser: 0.57 mg/dL (ref 0.44–1.00)
GFR calc Af Amer: >60 mL/min (ref >60)
GFR calc non Af Amer: >60 mL/min (ref >60)
Glucose, Bld: 128 mg/dL — ABNORMAL HIGH (ref 70–99)
Potassium: 4.4 mmol/L (ref 3.5–5.1)
Sodium: 142 mmol/L (ref 135–145)
Total Bilirubin: 0.8 mg/dL (ref 0.3–1.2)
Total Protein: 5.9 g/dL — ABNORMAL LOW (ref 6.5–8.1)

## 2019-04-07 LAB — BRAIN NATRIURETIC PEPTIDE: B Natriuretic Peptide: 382.4 pg/mL — ABNORMAL HIGH (ref 0.0–100.0)

## 2019-04-07 LAB — CBC WITH DIFFERENTIAL/PLATELET
Abs Immature Granulocytes: 0.05 10*3/uL (ref 0.00–0.07)
Basophils Absolute: 0 10*3/uL (ref 0.0–0.1)
Basophils Relative: 0 %
Eosinophils Absolute: 0.1 10*3/uL (ref 0.0–0.5)
Eosinophils Relative: 1 %
HCT: 30.5 % — ABNORMAL LOW (ref 36.0–46.0)
Hemoglobin: 9.4 g/dL — ABNORMAL LOW (ref 12.0–15.0)
Immature Granulocytes: 1 %
Lymphocytes Relative: 26 %
Lymphs Abs: 2.6 10*3/uL (ref 0.7–4.0)
MCH: 25.8 pg — ABNORMAL LOW (ref 26.0–34.0)
MCHC: 30.8 g/dL (ref 30.0–36.0)
MCV: 83.6 fL (ref 80.0–100.0)
Monocytes Absolute: 1 10*3/uL (ref 0.1–1.0)
Monocytes Relative: 10 %
Neutro Abs: 6.3 10*3/uL (ref 1.7–7.7)
Neutrophils Relative %: 62 %
Platelets: 281 10*3/uL (ref 150–400)
RBC: 3.65 MIL/uL — ABNORMAL LOW (ref 3.87–5.11)
RDW: 17.2 % — ABNORMAL HIGH (ref 11.5–15.5)
WBC: 10 10*3/uL (ref 4.0–10.5)
nRBC: 0 % (ref 0.0–0.2)

## 2019-04-07 LAB — D-DIMER, QUANTITATIVE: D-Dimer, Quant: 2.94 ug/mL-FEU — ABNORMAL HIGH (ref 0.00–0.50)

## 2019-04-07 LAB — GLUCOSE, CAPILLARY
Glucose-Capillary: 95 mg/dL (ref 70–99)
Glucose-Capillary: 303 mg/dL — ABNORMAL HIGH (ref 70–99)

## 2019-04-07 LAB — MAGNESIUM: Magnesium: 2 mg/dL (ref 1.7–2.4)

## 2019-04-07 LAB — PROCALCITONIN: Procalcitonin: <0.1 ng/mL

## 2019-04-07 LAB — C-REACTIVE PROTEIN: CRP: 1.1 mg/dL — ABNORMAL HIGH (ref <1.0)

## 2019-04-07 MED ORDER — SPIRONOLACTONE 25 MG PO TABS: 25.0000 mg | ORAL_TABLET | Freq: Every day | ORAL | 0 refills | Status: AC

## 2019-04-07 MED ORDER — FUROSEMIDE 40 MG PO TABS: 40.0000 mg | ORAL_TABLET | Freq: Every day | ORAL | 0 refills | Status: AC

## 2019-04-07 MED ORDER — DOXYCYCLINE HYCLATE 100 MG PO TABS: 100.0000 mg | ORAL_TABLET | Freq: Two times a day (BID) | ORAL | 0 refills | Status: DC

## 2019-04-07 NOTE — Discharge Summary (Signed)
Desiree Mccall ZOX:[id number] DOB: 03/08/1951 DOA: 04/02/2019  PCP: Abram SanderAdamo, Elena M, MD  Admit date: 04/02/2019  Discharge date: 04/07/2019  Admitted From: Home  Disposition:  Home   Recommendations for Outpatient Follow-up:   Follow up with PCP in 1-2 weeks  PCP Please obtain BMP/CBC, 2 view CXR in 1week,  (see Discharge instructions)   PCP Please follow up on the following pending results: Follow CHF diet compliance   Home Health: RN to monitor CHF compliance Equipment/Devices: None Consultations: Electrophysiology Dr. 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 Patient was treated with steroids, remdesivir, antibiotics, diuretics-extubated-and subsequently transferred to [facility].                                                                 Hospital Course    1. Acute Hypoxic Resp. Failure due to Acute Covid 19 Viral Pneumonitis during the ongoing 2020 Covid 19 Pandemic - Clinically improved- suspect main physiology behind hypoxia was decompensated  systolic heart failure-currently well compensated. Has finished her remdesivir and steroid treatment for incidental COVID-19 pneumonitis which I did not think contributed to her hypoxia as her hypoxia pretty much resolved within 48 hours which is very atypical for severe Covid disease and would fit went sudden decompensated heart failure due to diet noncompliance during Thanksgiving meal.  This is similar to her prior respiratory failure episode few months ago where she required brief intubation as well.  She has been given strict counseling on fluid and salt restriction, daily weights, her Lasix dose has been increased, she will continue her beta-blocker and ARB along with Aldactone at home dose, follow with PCP and her primary cardiologist within a week.  COVID-19 Labs  Recent Labs    04/05/19 0220 04/06/19 0110 04/07/19 0405  DDIMER 2.79* 2.86* 2.94*  CRP 2.0* 1.4* 1.1*    Lab Results  Component Value Date   SARSCOV2NAA NEGATIVE 02/27/2019   Gram positive cocci and veillonellabacteremia:Await final culture results-Case was discussed with infectious disease-Dr. Drue SecondSnider yesterday-recommendations are to repeat blood cultures-and if negative for more than 24 hours-to discharge  on doxycycline for total of 10 days. Note patient has an AICD in place, stable procalcitonin and repeat cultures negative for 3 days, she will get doxycycline oral upon discharge to complete total 10-day course thereafter follow with PCP.  Acute on chronic combined systolic and diastolic heart failure: Euvolemic-continue intravenous Lasix. Remains on Coreg, ARB and Aldactone. AICD in place-she follows with cardiology at [facility].    She did have an episode of inappropriate firing of her AICD during this hospital stay, AICD was interrogated and EP physician Dr. Graciela Husbands was consulted, report shows inappropriate discharge during a brief episode of paroxysmal A. fib, she is in sinus now, will follow with primary cardiologist  at [facility] within a week electrolytes stable patient stable.  PAF:Had a burst of narrow complex tachycardiaa few days back-currently stable on Coreg 12.5 mg twice daily, remains on Xarelto.   CAD: No anginal symptoms-remains on dual antiplatelets, statin and Coreg.Patient confirms that she is indeed on dual antiplatelet agents-have asked her to follow-up with her cardiologist to see if she still needs to be on both of these agents (in addition to Xarelto).  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  Obesity: BMI 39, follow with PCP for weight loss.  DM-2: Continue home regimen.  Lab Results  Component Value Date   HGBA1C 6.8 (H) 02/27/2019      Discharge diagnosis     Active Problems:   COVID-19 virus infection    Discharge instructions    Discharge Instructions    (HEART FAILURE PATIENTS) Call MD:  Anytime you have any of the following symptoms: 1) 3 pound weight gain in 24 hours or 5 pounds in 1 week 2) shortness of breath, with or without a dry hacking cough 3) swelling in the hands, feet or stomach 4) if you have to sleep on extra pillows at night in order to breathe.   Complete by: As directed    Call MD for:  difficulty breathing, headache or visual disturbances   Complete by: As directed    Call MD for:  extreme fatigue   Complete by: As directed    Call MD for:  persistant dizziness or light-headedness   Complete by: As directed    Call MD for:  persistant nausea and vomiting   Complete by: As directed    Diet - low sodium  heart healthy   Complete by: As directed    Discharge instructions   Complete by: As directed    Follow with Primary MD  Abram Sander, MD in 1-2 weeks  Please get a complete blood count and chemistry panel checked by your Primary MD at your next visit, and again as instructed by your Primary MD.  Get Medicines reviewed and adjusted: Please take all your medications with you for your next visit with your Primary MD  Laboratory/radiological data: Please request your Primary MD to go over all hospital tests and procedure/radiological results at the follow up, please ask your Primary MD to get all Hospital records sent to his/her office.  In some cases, they will be blood work, cultures and biopsy results pending at the time of your discharge.  Please request that your primary care M.D. follows up on these results.  Also Note the following: If you experience worsening of your admission symptoms, develop shortness of breath, life threatening emergency, suicidal or homicidal thoughts you must seek medical attention immediately by calling 911 or calling your MD immediately  if symptoms less severe.  You must read complete instructions/literature along with all the possible adverse reactions/side effects for all the Medicines you take and that have been prescribed to you. Take any new Medicines after you have completely understood and accpet all the possible adverse reactions/side effects.   Do not drive when taking Pain medications or sleeping medications (Benzodaizepines)  Do not take more than prescribed Pain, Sleep and Anxiety Medications. It is not advisable to combine anxiety,sleep and pain medications without talking with your primary care practitioner  Special Instructions: If you have smoked or chewed Tobacco  in the last 2 yrs please stop smoking, stop any regular Alcohol  and or any Recreational drug use.  Wear Seat belts while driving.  Please note: You were cared for by a  hospitalist during your hospital stay. Once you are discharged, your primary care physician will handle any further medical issues. Please note that NO REFILLS for any discharge medications will be authorized once you are discharged, as it is imperative that you return to your primary care physician (or establish a relationship with a primary care physician if you do not have one) for your post hospital discharge needs so that they can reassess your need for medications and monitor your lab values.   3 weeks of isolation from 03/30/19   Discharge instructions   Complete by: As directed    Follow with Primary MD Richarda Blade Jeris Penta, MD and your primary cardiologist in 7 days   Get CBC, CMP, 2 view Chest X ray -  checked next visit within 1 week by Primary MD   Activity: As tolerated with Full fall precautions use walker/cane & assistance as needed  Disposition Home    Diet: Heart Healthy Low Carb diet, strict 1.5 L/day total fluid restriction.  Check your Weight same time everyday, if you gain over 2 pounds, or you develop in leg swelling, experience more shortness of breath or chest pain, call your Primary MD immediately. Follow Cardiac Low Salt Diet and 1.5 lit/day fluid restriction.  Special Instructions: If you have smoked or chewed Tobacco  in the last 2 yrs please stop smoking, stop any regular Alcohol  and or any Recreational drug use.  On your next visit with your primary care physician please Get Medicines reviewed and adjusted.  Please request your Prim.MD to go over all Hospital Tests and Procedure/Radiological results at the follow up, please get all Hospital records sent to your Prim MD by signing hospital release before you go home.  If you experience worsening of your admission symptoms, develop shortness of breath, life threatening emergency, suicidal or homicidal thoughts you must seek medical attention immediately by calling 911 or calling your MD immediately  if symptoms less  severe.  You Must read complete instructions/literature along with all the possible adverse reactions/side effects for all the Medicines you take and that have been prescribed to you. Take any new Medicines after you have completely understood and accpet all the possible adverse reactions/side effects.   Increase activity slowly   Complete by: As directed    Increase activity slowly   Complete by: As directed       Discharge Medications  Allergies as of 04/07/2019   [name]     Medication List    STOP taking these medications   carvedilol 6.25 MG tablet Commonly known as: COREG     TAKE these medications   acetaminophen 325 MG tablet Commonly known as: TYLENOL Take 2 tablets (650 mg total) by mouth every 4 (four) hours as needed for mild pain (temp > 101.5).   albuterol (2.5 MG/3ML) 0.083% nebulizer solution Commonly known as: PROVENTIL Take 2.5 mg by nebulization every 2 (two) hours as needed for wheezing or shortness of breath.   aspirin EC 81 MG tablet Take 81 mg by mouth daily.   atorvastatin 40 MG tablet Commonly known as: LIPITOR Take 1 tablet (40 mg total) by mouth daily at 6 PM.   Bisacodyl Laxative 10 MG suppository Generic drug: bisacodyl Place 10 mg rectally daily as needed for moderate constipation.   budesonide 0.5 MG/2ML nebulizer solution Commonly known as: PULMICORT Take 0.5 mg by nebulization 2 (two) times daily.   clopidogrel 75 MG tablet Commonly known as: PLAVIX Take 75 mg by mouth daily.   docusate sodium 100 MG capsule Commonly known as: COLACE Take 100 mg by mouth daily as needed for mild constipation.   doxycycline 100 MG tablet Commonly known as: VIBRA-TABS Take 1 tablet (100 mg total) by mouth every 12 (twelve) hours.   famotidine 20 MG tablet Commonly known as: PEPCID Take 20 mg by mouth 2 (two) times daily.   FLUoxetine 20 MG capsule Commonly known as: PROZAC Take 40 mg by mouth daily.   fluticasone 50 MCG/ACT  nasal spray Commonly known as: FLONASE Place 1 spray into both nostrils daily.   furosemide 40 MG tablet Commonly known as: LASIX Take 1 tablet (40 mg total) by mouth daily. What changed:   medication strength  how much to take   insulin detemir 100 UNIT/ML injection Commonly known as: Levemir Inject 0.3 mLs (30 Units total) into the skin daily.   ipratropium-albuterol 0.5-2.5 (3) MG/3ML Soln Commonly known as: DUONEB Take 3 mLs by nebulization every 4 (four) hours as needed (wheezing or shortness of breath).   losartan 50 MG tablet Commonly known as: COZAAR Take 100 mg by mouth daily.   metFORMIN 1000 MG tablet Commonly known as: Glucophage Take 1 tablet (1,000 mg total) by mouth daily with breakfast. What changed: when to take this   metoprolol succinate 100 MG 24 hr tablet Commonly known as: TOPROL-XL Take 100 mg by mouth daily.   montelukast 10 MG tablet Commonly known as: SINGULAIR Take 10 mg by mouth at bedtime.   multivitamin with minerals Tabs tablet Take 1 tablet by mouth daily.   pantoprazole 40 MG tablet Commonly known as: PROTONIX Take 40 mg by mouth daily.   rivaroxaban 20 MG Tabs tablet Commonly known as: XARELTO Take 20 mg by mouth daily with supper.   sennosides-docusate sodium 8.6-50 MG tablet Commonly known as: SENOKOT-S Take 1 tablet by mouth 2 (two) times daily as needed for constipation.   spironolactone 25 MG tablet Commonly known as: ALDACTONE Take 1 tablet (25 mg total) by mouth daily.            Durable Medical Equipment  (From admission, onward)         Start     Ordered   04/07/19 0818  For home use only DME 3 n 1  Once     04/07/19 0817          Follow-up Information  Cheryl with [facility]. Call.   Why: Call with any questions. Contact information: [phone]       [facility] Follow up.   Why: Bedside Com[facility]       [facility] .   Contact information: [address] [place] [address] [phone]        Frazier Richards, MD. Schedule an appointment as soon as possible for a visit in 1 week(s).   Specialty: Family Medicine Why: And see your primary cardiologist at [facility] within a week. Contact information: [place] [address] [phone]           Major procedures and Radiology Reports - PLEASE review detailed and final reports thoroughly  -        Dg Chest Port 1 View  Result Date: 04/02/2019 CLINICAL DATA:  Shortness of breath. EXAM: PORTABLE CHEST 1 VIEW COMPARISON:  Radiograph yesterday. CT 02/27/2019 FINDINGS: Endotracheal and enteric tubes have been removed. Improved lung volumes. Left-sided pacemaker remains in place. Post aortic stent graft repair, unchanged in radiographic appearance. Unchanged cardiomegaly. Unchanged mediastinal contours. Mild vascular congestion without pulmonary edema. Minimal blunting of left costophrenic angle suggesting small effusion. Streaky bibasilar atelectasis. No pneumothorax. IMPRESSION: 1. Increasing vascular congestion, stable cardiomegaly. 2. Improved lung volumes after extubation. Streaky bibasilar atelectasis. 3. Possible small left pleural effusion, increased. Electronically Signed   By: Keith Rake M.D.   On: 04/02/2019 18:21   Dg Chest Port 1 View  Result Date: 04/01/2019 CLINICAL DATA:  Acute respiratory failure. EXAM: PORTABLE CHEST 1 VIEW COMPARISON:  03/31/2019. FINDINGS: Endotracheal tube and NG tube in stable position. Cardiac pacer stable position. Aortic stent graft and vascular coils noted in stable position. Stable cardiomegaly. No pulmonary venous congestion. No focal infiltrate. Mild bibasilar subsegmental atelectasis again noted. No pleural effusion noted on today's exam. No pneumothorax. IMPRESSION: 1.  Endotracheal tube and NG tube in stable position. 2. Cardiac pacer in stable position. Aortic stent graft and vascular coils noted stable position. Stable  cardiomegaly. No pulmonary venous congestion. 3. Mild bibasilar subsegmental atelectasis again noted. No pleural effusion noted on today's exam. Electronically Signed   By: Marcello Moores  Register   On: 04/01/2019 06:13   Dg Chest Port 1 View  Result Date: 03/31/2019 CLINICAL DATA:  Shortness of breath. EXAM: PORTABLE CHEST 1 VIEW COMPARISON:  Chest x-rays dated 03/30/2019 and 02/28/2019. FINDINGS: Improved aeration within the upper lobes bilaterally suggesting improved fluid status. Probable bibasilar atelectasis and/or small pleural effusions. Stable cardiomegaly. Endotracheal tube appears adequately positioned with tip at the level of the clavicles. Enteric tube passes below the diaphragm. Aortic stent appears stable in configuration. LEFT chest wall pacemaker/ICD apparatus appears stable. IMPRESSION: 1. Improved aeration within the upper lobes bilaterally suggesting improved fluid status. 2. Probable bibasilar atelectasis and/or small pleural effusions. 3. Stable cardiomegaly. Electronically Signed   By: Franki Cabot M.D.   On: 03/31/2019 10:58   Dg Chest Portable 1 View  Result Date: 03/30/2019 CLINICAL DATA:  Shortness of breath, respiratory failure EXAM: PORTABLE CHEST 1 VIEW COMPARISON:  02/28/2019 FINDINGS: Left AICD remains in place, unchanged. The endotracheal tube is 6 cm above the carina. NG tube enters the stomach. Prior aortic stent graft. Airspace disease bilaterally, most pronounced in the upper lobes and perihilar regions. Given the upper lobe predominance, favor infection over edema. No visible effusions. No acute bony abnormality. IMPRESSION: Cardiomegaly, vascular congestion. Bilateral upper lobe and perihilar opacities concerning for pneumonia. Electronically Signed   By:  Charlett Nose M.D.   On: 03/30/2019 21:39    Micro Results     Recent Results (from the past 240 hour(s))  Blood Culture (routine x 2)     Status: None   Collection Time: 03/30/19  9:35 PM   Specimen: BLOOD   Result Value Ref Range Status   Specimen Description BLOOD LEFT ANTECUBITAL  Final   Special Requests   Final    BOTTLES DRAWN AEROBIC AND ANAEROBIC Blood Culture adequate volume   Culture   Final    NO GROWTH 5 DAYS Performed at [facility], [address], [place], [place] [address]    Report Status 04/04/2019 FINAL  Final  Blood Culture (routine x 2)     Status: Abnormal   Collection Time: 03/30/19  9:37 PM   Specimen: BLOOD  Result Value Ref Range Status   Specimen Description   Final    BLOOD BLOOD LEFT ARM Performed at [facility], [address] [place], [place] [address]    Special Requests   Final    BOTTLES DRAWN AEROBIC AND ANAEROBIC Blood Culture adequate volume Performed at [facility], [address] [place], [place] [address]    Culture  Setup Time   Final    GRAM POSITIVE COCCI IN BOTH AEROBIC AND ANAEROBIC BOTTLES CRITICAL RESULT CALLED TO, READ BACK BY AND VERIFIED WITH: Harrell Lark NAZARI 03/31/2019 AT 2039 HS GRAM POSITIVE RODS ANAEROBIC BOTTLE ONLY CRITICAL RESULT CALLED TO, READ BACK BY AND VERIFIED WITH: DR Windell Norfolk [id number] AT 1252 BY CM Performed at [facility], [address], [place], [place] [address]    Culture (A)  Final    VEILLONELLA SPECIES Standardized susceptibility testing for this organism is not available. ROTHIA MUCILAGINOSA Standardized susceptibility testing for this organism is not available.    Report Status 04/05/2019 FINAL  Final  MRSA PCR Screening     Status: None   Collection Time: 03/31/19  4:02 AM   Specimen: Nasopharyngeal  Result Value Ref Range Status   MRSA by PCR NEGATIVE NEGATIVE Final    Comment:        The GeneXpert MRSA Assay (FDA approved for NASAL specimens only), is one component of a comprehensive MRSA colonization surveillance program. It is not intended to diagnose MRSA infection nor to guide or monitor treatment for MRSA infections. Performed at [facility], [address] [place], [place] [address]   Culture, respiratory (non-expectorated)     Status: None   Collection Time: 03/31/19 12:28 PM   Specimen: Tracheal Aspirate; Respiratory  Result Value Ref Range Status   Specimen Description   Final    TRACHEAL ASPIRATE Performed at [facility], [address] [place], [place] [address]    Special Requests   Final    Normal Performed at [facility], [address] [place], [place] [address]    Gram Stain   Final    FEW WBC PRESENT, PREDOMINANTLY PMN RARE GRAM POSITIVE COCCI IN PAIRS RARE GRAM POSITIVE RODS    Culture   Final    Consistent with normal respiratory flora. Performed at [facility], [address] [place], [place] [address]    Report Status 04/02/2019 FINAL  Final  Urine culture     Status: None   Collection Time: 04/01/19  2:23 AM   Specimen: In/Out Cath Urine  Result Value Ref Range Status   Specimen Description   Final    IN/OUT  CATH URINE Performed at [facility], [address], [place], [place] [address]    Special Requests   Final    NONE Performed at [facility], [address] [place], [place] [address]    Culture   Final    NO GROWTH Performed at [facility], [address], [place], [place] [address]    Report Status 04/02/2019 FINAL  Final  Culture, blood (routine x 2)     Status: None (Preliminary result)   Collection Time: 04/04/19  2:15 PM   Specimen: BLOOD  Result Value Ref Range Status   Specimen Description   Final    BLOOD RIGHT ARM Performed at [facility], [address], [place], [place] [address]    Special Requests   Final    BOTTLES DRAWN AEROBIC ONLY Blood Culture adequate volume Performed at [facility], [address], [place], [place] [address]    Culture   Final    NO GROWTH 3 DAYS Performed at [facility], [address] [place], [place] [address]    Report  Status PENDING  Incomplete  Culture, blood (routine x 2)     Status: None (Preliminary result)   Collection Time: 04/04/19  4:25 PM   Specimen: BLOOD  Result Value Ref Range Status   Specimen Description   Final    BLOOD RIGHT ARM Performed at [facility], [address], [place], [place] [address]    Special Requests   Final    BOTTLES DRAWN AEROBIC ONLY Blood Culture adequate volume Performed at [facility], [address], [place], [place] [address]    Culture   Final    NO GROWTH 3 DAYS Performed at [facility], [address] [place], [place] [address]    Report Status PENDING  Incomplete    Today   Subjective    Genecis Veley today has no headache,no chest abdominal pain,no new weakness tingling or numbness, feels much better wants to go home today.     Objective   Blood pressure 125/78, pulse (!) 55, temperature 98.4 F (36.9 C), resp. rate 12, SpO2 100 %.   Intake/Output Summary (Last 24 hours) at 04/07/2019 0919 Last data filed at 04/06/2019 1600 Gross per 24 hour  Intake 660 ml  Output 1300 ml  Net -640 ml    Exam Awake Alert, Oriented x 3, No new F.N deficits, Normal affect [place].AT,PERRAL Supple Neck,No JVD, No cervical lymphadenopathy appriciated.  Symmetrical Chest wall movement, Good air movement bilaterally, CTAB RRR,No Gallops,Rubs or new Murmurs, No Parasternal Heave +ve B.Sounds, Abd Soft, Non tender, No organomegaly appriciated, No rebound -guarding or rigidity. No Cyanosis, Clubbing or edema, No new Rash or bruise   Data Review   CBC w Diff:  Lab Results  Component Value Date   WBC 10.0 04/07/2019   HGB 9.4 (L) 04/07/2019   HCT 30.5 (L) 04/07/2019   PLT 281 04/07/2019   LYMPHOPCT 26 04/07/2019   MONOPCT 10 04/07/2019   EOSPCT 1 04/07/2019   BASOPCT 0 04/07/2019    CMP:  Lab Results  Component Value Date   NA 142 04/07/2019   K 4.4 04/07/2019   CL 101 04/07/2019   CO2 33 (H) 04/07/2019    BUN 19 04/07/2019   CREATININE 0.57 04/07/2019   PROT 5.9 (L) 04/07/2019   ALBUMIN 3.3 (L) 04/07/2019   BILITOT 0.8 04/07/2019   ALKPHOS 89 04/07/2019  AST 22 04/07/2019   ALT 33 04/07/2019  .   Total Time in preparing paper work, data evaluation and todays exam - 35 minutes  Susa Raring M.D on 04/07/2019 at 9:19 AM  [facility]   Office  [phone]

## 2019-04-07 NOTE — TOC Transition Note (Signed)
Transition of Care [facility]) - CM/SW Discharge Note   Patient Details  Name: Desiree Mccall MRN: [id number] Date of Birth: 04/09/1951  Transition of Care [facility]) CM/SW Contact:  Maryclare Labrador, RN Phone Number: 04/07/2019, 8:17 AM   Clinical Narrative:    Pt to discharge home today - pts daughter will transport her home.  Pt informed CM that she is independent from home alone - pt denied barriers with returning to home setting.  Pt remains in agreement with [facility] - previously arranged by CM AK.  Pt is interested in 3:1 as recommended - pt chose [facility].  AC contacted and will deliver equipment to the room.  Pt has PCP and will make follow up appt on Monday - informing office that she is covid positive.  No other CM needs determined at this time - CM signing off   Final next level of care: Home w Home Health Services Barriers to Discharge: Barriers Resolved   Patient Goals and CMS Choice   CMS Medicare.gov Compare Post Acute Care list provided to:: Patient Choice offered to / list presented to : Patient  Discharge Placement                       Discharge Plan and Services                DME Arranged: 3-N-1 DME Agency: [facility] Date DME Agency Contacted: 04/07/19 Time DME Agency Contacted: (AC removed from equipment storage)   HH Arranged: RN, PT, OT HH Agency: [place] Date [place]: 04/04/19 Time [place]: 1108 Representative spoke with at [place]: [place] ([place]) Interventions     Readmission Risk Interventions Readmission Risk Prevention Plan 04/05/2019 04/02/2019 03/03/2019  Transportation Screening - Complete Complete  PCP or Specialist Appt within 3-5 Days Complete - Complete  HRI or Home Care Consult Complete - Complete  Social Work Consult for [place] Planning/Counseling Complete - Complete  Palliative Care Screening Not Applicable - Complete  Medication Review Human resources officer) - Complete Complete  SW Recovery Care/Counseling Consult - Complete -  Some recent data might be hidden

## 2019-04-07 NOTE — Progress Notes (Signed)
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 L stent in ureter, L heart cath and angiography, chole, carotid stent. OSA on CPAP. Prestented to ER after thanksgiving w/ SOB was found to have severe respiratory distress and was intubated, COVID testing came back positive. extubated with 48hrs of admisison then sent to [facility].   OT comments  Pt continues to make progress in therapy, demonstrating improved activity tolerance and independence with self-care and functional transfer tasks. Pt able to ambulate to/from bathroom with supervision and without use of assistive device, noting 0 instances of LOB. Pt tolerated standing 1 x 10 min at the sink to complete grooming/hygiene and sponge bathing tasks. Pt's O2 SATs maintained in 90s throughout on RA. 2/4 DOE. Continued education/instruction with pt on energy conservation techniques and relaxation strategies with good understanding. Discussed DME options for shower. Recommend HH OT services for continued rehab following hospital discharge.    Follow Up Recommendations  Home health OT;Supervision - Intermittent    Equipment Recommendations  3 in 1 bedside commode(for use in shower)    Recommendations for Other Services      Precautions / Restrictions Precautions Precautions: Fall Restrictions Weight Bearing Restrictions: No       Mobility Bed Mobility Overal bed mobility: Modified Independent                Transfers Overall transfer level: Needs assistance Equipment used: None Transfers: Sit to/from Stand Sit to Stand: Supervision         General transfer comment: Pt able to ambulate  to/from bathroom with supervision and without use of an assistive device. Noted 0 instances of LOB.    Balance Overall balance assessment: Needs assistance   Sitting balance-Leahy Scale: Good       Standing balance-Leahy Scale: Fair                             ADL either performed or assessed with clinical judgement   ADL       Grooming: Oral care;Wash/dry face;Wash/dry hands;Supervision/safety;Standing(While standing at the sink)   Upper Body Bathing: Supervision/ safety;Standing(Sponge bathing at the sink)   Lower Body Bathing: Supervison/ safety;Sit to/from stand(Sponge bathing at the sink)           Toilet Transfer: Retail banker;Ambulation   Toileting- Clothing Manipulation and Hygiene: Supervision/safety;Sit to/from stand       Functional mobility during ADLs: Supervision/safety       Vision       Perception     Praxis      Cognition Arousal/Alertness: Awake/alert Behavior During Therapy: WFL for tasks assessed/performed Overall Cognitive Status: Within Functional Limits for tasks assessed                                          Exercises Exercises: Other exercises Other Exercises Other Exercises: Incentive spirometer x 10 with min cues on technique. Pulls Other Exercises: Flutter valve x 10 with min cues on technique.   Shoulder Instructions       General Comments Pt's O2 SATs maintained  in 90s throughout on RA. Pt's HR fluctuated between 78-109BPM during activities. Continued education/instruction with pt on safety strategies, activity modifications, and energy conservation techniques for ADLs, IADLs, and transfers.     Pertinent Vitals/ Pain       Pain Assessment: No/denies pain  Home Living                                          Prior Functioning/Environment              Frequency           Progress Toward Goals  OT Goals(current goals can now be  found in the care plan section)  Progress towards OT goals: Progressing toward goals  ADL Goals Pt Will Perform Grooming: with modified independence;standing Pt Will Perform Lower Body Dressing: with modified independence;sit to/from stand Pt Will Transfer to Toilet: with modified independence;ambulating Pt Will Perform Toileting - Clothing Manipulation and hygiene: with modified independence;sit to/from stand Additional ADL Goal #1: Pt will verbalize and independently implement energy conservation strategies during ADL tasks PRN.  Plan Discharge plan remains appropriate    Co-evaluation                 AM-PAC OT "6 Clicks" Daily Activity     Outcome Measure   Help from another person eating meals?: None Help from another person taking care of personal grooming?: A Little Help from another person toileting, which includes using toliet, bedpan, or urinal?: A Little Help from another person bathing (including washing, rinsing, drying)?: A Little Help from another person to put on and taking off regular upper body clothing?: None Help from another person to put on and taking off regular lower body clothing?: A Little 6 Click Score: 20    End of Session    OT Visit Diagnosis: Unsteadiness on feet (R26.81);Muscle weakness (generalized) (M62.81)   Activity Tolerance Patient tolerated treatment well   Patient Left in chair;with call bell/phone within reach;with nursing/sitter in room   Nurse Communication Mobility status        Time: 5093-2671 OT Time Calculation (min): 32 min  Charges: OT General Charges $OT Visit: 1 Visit OT Treatments $Self Care/Home Management : 8-22 mins $Therapeutic Activity: 8-22 mins  Mauri Brooklyn OTR/L [phone]    Mauri Brooklyn 04/07/2019, 11:46 AM

## 2019-04-09 LAB — CULTURE, BLOOD (ROUTINE X 2)
Culture: NO GROWTH
Culture: NO GROWTH
Special Requests: ADEQUATE
Special Requests: ADEQUATE

## 2019-05-06 ENCOUNTER — Other Ambulatory Visit: Payer: Self-pay

## 2019-05-06 ENCOUNTER — Ambulatory Visit
Admission: RE | Admit: 2019-05-06 | Discharge: 2019-05-06 | Disposition: A | Discharge status: Home / Self Care | Payer: Medicare Other | Source: Ambulatory Visit | Attending: Family Medicine | Admitting: Family Medicine

## 2019-05-06 ENCOUNTER — Other Ambulatory Visit: Payer: Self-pay | Admitting: Family Medicine

## 2019-05-06 DIAGNOSIS — J90 Pleural effusion, not elsewhere classified: Secondary | ICD-10-CM | POA: Diagnosis not present

## 2019-07-21 ENCOUNTER — Other Ambulatory Visit: Payer: Self-pay

## 2019-07-21 ENCOUNTER — Emergency Department
Admission: EM | Admit: 2019-07-21 | Discharge: 2019-07-21 | Disposition: A | Discharge status: Home / Self Care | Payer: Medicare Other | Attending: Emergency Medicine | Admitting: Emergency Medicine

## 2019-07-21 ENCOUNTER — Emergency Department: Payer: Medicare Other

## 2019-07-21 DIAGNOSIS — Z79899 Other long term (current) drug therapy: Secondary | ICD-10-CM | POA: Insufficient documentation

## 2019-07-21 DIAGNOSIS — R110 Nausea: Secondary | ICD-10-CM | POA: Insufficient documentation

## 2019-07-21 DIAGNOSIS — I2510 Atherosclerotic heart disease of native coronary artery without angina pectoris: Secondary | ICD-10-CM | POA: Diagnosis not present

## 2019-07-21 DIAGNOSIS — Z952 Presence of prosthetic heart valve: Secondary | ICD-10-CM | POA: Insufficient documentation

## 2019-07-21 DIAGNOSIS — Z95810 Presence of automatic (implantable) cardiac defibrillator: Secondary | ICD-10-CM | POA: Insufficient documentation

## 2019-07-21 DIAGNOSIS — Z8616 Personal history of COVID-19: Secondary | ICD-10-CM | POA: Insufficient documentation

## 2019-07-21 DIAGNOSIS — E119 Type 2 diabetes mellitus without complications: Secondary | ICD-10-CM | POA: Diagnosis not present

## 2019-07-21 DIAGNOSIS — I5022 Chronic systolic (congestive) heart failure: Secondary | ICD-10-CM | POA: Insufficient documentation

## 2019-07-21 DIAGNOSIS — Z8679 Personal history of other diseases of the circulatory system: Secondary | ICD-10-CM | POA: Insufficient documentation

## 2019-07-21 DIAGNOSIS — R1013 Epigastric pain: Secondary | ICD-10-CM | POA: Insufficient documentation

## 2019-07-21 DIAGNOSIS — Z7901 Long term (current) use of anticoagulants: Secondary | ICD-10-CM | POA: Insufficient documentation

## 2019-07-21 DIAGNOSIS — Z794 Long term (current) use of insulin: Secondary | ICD-10-CM | POA: Insufficient documentation

## 2019-07-21 LAB — COMPREHENSIVE METABOLIC PANEL
ALT: 22 U/L (ref 0–44)
AST: 26 U/L (ref 15–41)
Albumin: 4.1 g/dL (ref 3.5–5.0)
Alkaline Phosphatase: 126 U/L (ref 38–126)
Anion gap: 10 (ref 5–15)
BUN: 24 mg/dL — ABNORMAL HIGH (ref 8–23)
CO2: 27 mmol/L (ref 22–32)
Calcium: 9.1 mg/dL (ref 8.9–10.3)
Chloride: 101 mmol/L (ref 98–111)
Creatinine, Ser: 0.73 mg/dL (ref 0.44–1.00)
GFR calc Af Amer: >60 mL/min (ref >60)
GFR calc non Af Amer: >60 mL/min (ref >60)
Glucose, Bld: 171 mg/dL — ABNORMAL HIGH (ref 70–99)
Potassium: 3.9 mmol/L (ref 3.5–5.1)
Sodium: 138 mmol/L (ref 135–145)
Total Bilirubin: 0.4 mg/dL (ref 0.3–1.2)
Total Protein: 7.7 g/dL (ref 6.5–8.1)

## 2019-07-21 LAB — CBC WITH DIFFERENTIAL/PLATELET
Abs Immature Granulocytes: 0.03 10*3/uL (ref 0.00–0.07)
Basophils Absolute: 0 10*3/uL (ref 0.0–0.1)
Basophils Relative: 1 %
Eosinophils Absolute: 0.1 10*3/uL (ref 0.0–0.5)
Eosinophils Relative: 1 %
HCT: 36.6 % (ref 36.0–46.0)
Hemoglobin: 11.3 g/dL — ABNORMAL LOW (ref 12.0–15.0)
Immature Granulocytes: 0 %
Lymphocytes Relative: 28 %
Lymphs Abs: 2.4 10*3/uL (ref 0.7–4.0)
MCH: 25.3 pg — ABNORMAL LOW (ref 26.0–34.0)
MCHC: 30.9 g/dL (ref 30.0–36.0)
MCV: 81.9 fL (ref 80.0–100.0)
Monocytes Absolute: 0.8 10*3/uL (ref 0.1–1.0)
Monocytes Relative: 9 %
Neutro Abs: 5.2 10*3/uL (ref 1.7–7.7)
Neutrophils Relative %: 61 %
Platelets: 252 10*3/uL (ref 150–400)
RBC: 4.47 MIL/uL (ref 3.87–5.11)
RDW: 15.7 % — ABNORMAL HIGH (ref 11.5–15.5)
WBC: 8.6 10*3/uL (ref 4.0–10.5)
nRBC: 0 % (ref 0.0–0.2)

## 2019-07-21 LAB — TROPONIN I (HIGH SENSITIVITY)
Troponin I (High Sensitivity): 13 ng/L (ref <18)
Troponin I (High Sensitivity): 14 ng/L (ref <18)

## 2019-07-21 LAB — BRAIN NATRIURETIC PEPTIDE: B Natriuretic Peptide: 170 pg/mL — ABNORMAL HIGH (ref 0.0–100.0)

## 2019-07-21 LAB — LIPASE, BLOOD: Lipase: 74 U/L — ABNORMAL HIGH (ref 11–51)

## 2019-07-21 MED ORDER — SODIUM CHLORIDE 0.9 % IV BOLUS
500.0000 mL | Freq: Once | INTRAVENOUS | Status: AC
  Administered 2019-07-21: 16:00:00 500 mL via INTRAVENOUS

## 2019-07-21 MED ORDER — IOHEXOL 350 MG/ML SOLN
100.0000 mL | Freq: Once | INTRAVENOUS | Status: AC | PRN
  Administered 2019-07-21: 100 mL via INTRAVENOUS

## 2019-07-21 NOTE — Discharge Instructions (Signed)
Continue your current meds   Your dissection has not changed.  Follow-up with your doctor.  Return to ER if you have worsening abdominal pain or chest pain or trouble breathing.

## 2019-07-21 NOTE — ED Triage Notes (Signed)
Pt arrives via EMS from home after having a third episode of intense upper abdominal pain- pt denies pain now but wanted to be evaluated d/t extensive hx of AAA dissection, MI, and cardiac arrest- pt VSS per EMS- cbg 230

## 2019-07-21 NOTE — ED Provider Notes (Signed)
[facility] EMERGENCY DEPARTMENT Provider Note   CSN: [id number] Arrival date & time: 07/21/19  1533     History Chief Complaint  Patient presents with  . Abdominal Pain    Desiree Mccall is a 69 y.o. female history of Covid, CHF, aortic dissection with graft, aortic replacement on xarelto, here presenting with abdominal pain, nausea.  Patient states that she has some epigastric pain and some nausea since yesterday.  Denies any chest pain.  She is concerned that she may have another dissection or heart attack.  She has a history of MI as well as cardiac arrest.  She also had recent Covid infection and was intubated and went into heart failure at that time.  She thought there was more fluid buildup in her body so she took extra dose of her Lasix.   The history is provided by the patient.       Past Medical History:  Diagnosis Date  . Aortic dissection (HCC) 2010  . CHF (congestive heart failure) (HCC)   . Diabetes mellitus without complication (HCC)   . [name]   . Heart attack (HCC) sept. 25, 2010    Patient Active Problem List   Diagnosis Date Noted  . COVID-19 virus infection 04/02/2019  . COVID-19   . Acute respiratory failure (HCC)   . Sepsis with acute hypoxic respiratory failure without septic shock (HCC) 03/30/2019  . Fever of unknown origin 02/27/2019  . Chronic systolic CHF (congestive heart failure) (HCC) 05/28/2018  . Screening for malignant neoplasm of respiratory organ 07/19/2017  . H/O acute myocardial infarction 05/30/2017  . H/O: depression 05/30/2017  . Severe tobacco use disorder 05/30/2017  . Carotid stenosis, asymptomatic, left 05/04/2017  . OSA on CPAP 05/04/2017  . 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 Cat bite of forearm 11/22/2014  . Diabetes mellitus type 2, noninsulin dependent (HCC) 05/08/2014  . Syncope 04/10/2013  . Cardiac arrest (HCC) 04/10/2013  . Automatic implantable cardioverter-defibrillator  in situ 07/13/2012  . Aortic dissection (HCC) 11/22/2011  . CAD (coronary artery disease) 11/22/2011  . Dyslipidemia 11/22/2011  . History of stroke 11/22/2011  . Ischemic cardiomyopathy 11/22/2011  . Lumbar disc disease 11/22/2011  . PAD (peripheral artery disease) (HCC) 11/22/2011  . Pancreatitis due to common bile duct stone 11/22/2011    Past Surgical History:  Procedure Laterality Date  . ABDOMINAL HYSTERECTOMY    . CARDIAC SURGERY    . CAROTID STENT  2010  . CHOLECYSTECTOMY    . LEFT HEART CATH AND CORONARY ANGIOGRAPHY N/A 06/05/2018   Procedure: LEFT HEART CATH AND CORONARY ANGIOGRAPHY;  Surgeon: Laurier Nancy, MD;  Location: [facility] INVASIVE CV LAB;  Service: Cardiovascular;  Laterality: N/A;  . STENT PLACE LEFT URETER ([facility] HX)       OB History   No obstetric history on file.     Family History  Problem Relation Age of Onset  . Heart failure Mother   . Heart failure Father     Social History   Tobacco Use  . Smoking status: Former Smoker    Packs/day: 0.50    Types: Cigarettes    Start date: 2012  . Smokeless tobacco: Never Used  Substance Use Topics  . Alcohol use: Yes    Comment: occasionally  . Drug use: No    Home Medications Prior to Admission medications   Medication Sig Start Date End Date Taking? Authorizing Provider  acetaminophen (TYLENOL) 325 MG tablet Take 2 tablets (650  mg total) by mouth every 4 (four) hours as needed for mild pain (temp > 101.5). 06/05/18   Harlon Ditty D, NP  albuterol (PROVENTIL) (2.5 MG/3ML) 0.083% nebulizer solution Take 2.5 mg by nebulization every 2 (two) hours as needed for wheezing or shortness of breath.    [provider]  aspirin EC 81 MG tablet Take 81 mg by mouth daily. 10/29/18 10/29/19  [provider]  atorvastatin (LIPITOR) 40 MG tablet Take 1 tablet (40 mg total) by mouth daily at 6 PM. 06/06/18   Judithe Modest, NP  bisacodyl (BISACODYL LAXATIVE) 10 MG suppository Place 10 mg rectally  daily as needed for moderate constipation.    [provider]  budesonide (PULMICORT) 0.5 MG/2ML nebulizer solution Take 0.5 mg by nebulization 2 (two) times daily.    [provider]  clopidogrel (PLAVIX) 75 MG tablet Take 75 mg by mouth daily.    [provider]  docusate sodium (COLACE) 100 MG capsule Take 100 mg by mouth daily as needed for mild constipation.    [provider]  doxycycline (VIBRA-TABS) 100 MG tablet Take 1 tablet (100 mg total) by mouth every 12 (twelve) hours. 04/07/19   Leroy Sea, MD  famotidine (PEPCID) 20 MG tablet Take 20 mg by mouth 2 (two) times daily.    [provider]  FLUoxetine (PROZAC) 20 MG capsule Take 40 mg by mouth daily.  03/12/15   [provider]  fluticasone (FLONASE) 50 MCG/ACT nasal spray Place 1 spray into both nostrils daily.    [provider]  furosemide (LASIX) 40 MG tablet Take 1 tablet (40 mg total) by mouth daily. 04/07/19   Leroy Sea, MD  insulin detemir (LEVEMIR) 100 UNIT/ML injection Inject 0.3 mLs (30 Units total) into the skin daily. 04/04/19   Ghimire, Werner Lean, MD  ipratropium-albuterol (DUONEB) 0.5-2.5 (3) MG/3ML SOLN Take 3 mLs by nebulization every 4 (four) hours as needed (wheezing or shortness of breath).    [provider]  losartan (COZAAR) 50 MG tablet Take 100 mg by mouth daily. 01/02/19   [provider]  metFORMIN (GLUCOPHAGE) 1000 MG tablet Take 1 tablet (1,000 mg total) by mouth daily with breakfast. 04/04/19 04/03/20  Ghimire, Werner Lean, MD  metoprolol succinate (TOPROL-XL) 100 MG 24 hr tablet Take 100 mg by mouth daily. 10/22/18   [provider]  montelukast (SINGULAIR) 10 MG tablet Take 10 mg by mouth at bedtime.    [provider]  Multiple Vitamin (MULTIVITAMIN WITH MINERALS) TABS tablet Take 1 tablet by mouth daily.    [provider]  pantoprazole (PROTONIX) 40 MG tablet Take 40 mg by mouth daily.     [provider]  rivaroxaban (XARELTO) 20 MG TABS tablet Take 20 mg by mouth daily with supper.    [provider]  sennosides-docusate sodium (SENOKOT-S) 8.6-50 MG tablet Take 1 tablet by mouth 2 (two) times daily as needed for constipation.    [provider]  spironolactone (ALDACTONE) 25 MG tablet Take 1 tablet (25 mg total) by mouth daily. 04/07/19   Leroy Sea, MD    Allergies    Patient has no known allergies.  Review of Systems   Review of Systems  Gastrointestinal: Positive for abdominal pain.  All other systems reviewed and are negative.   Physical Exam Updated Vital Signs BP 90/64 (BP Location: Left Arm)   Pulse 63   Temp 98.5 F (36.9 C) (Oral)   Resp 18  Ht 5\' 7"  (1.702 m)   Wt 97.5 kg   SpO2 97%   BMI 33.67 kg/m   Physical Exam Vitals and nursing note reviewed.  Constitutional:      Appearance: She is well-developed.     Comments: Chronically ill   HENT:     Head: Normocephalic.     Mouth/Throat:     Mouth: Mucous membranes are moist.  Eyes:     Extraocular Movements: Extraocular movements intact.     Pupils: Pupils are equal, round, and reactive to light.  Cardiovascular:     Rate and Rhythm: Normal rate and regular rhythm.     Heart sounds: Normal heart sounds.  Pulmonary:     Effort: Pulmonary effort is normal.     Breath sounds: Normal breath sounds.  Abdominal:     General: Abdomen is flat. Bowel sounds are normal.     Comments: Mild epigastric tenderness   Skin:    General: Skin is warm.     Capillary Refill: Capillary refill takes less than 2 seconds.  Neurological:     General: No focal deficit present.     Mental Status: She is alert and oriented to person, place, and time.  Psychiatric:        Mood and Affect: Mood normal.        Behavior: Behavior normal.     ED Results / Procedures / Treatments   Labs (all labs ordered are listed, but only abnormal results are displayed) Labs Reviewed  CBC  WITH DIFFERENTIAL/PLATELET  COMPREHENSIVE METABOLIC PANEL  LIPASE, BLOOD  BRAIN NATRIURETIC PEPTIDE  URINALYSIS, COMPLETE (UACMP) WITH MICROSCOPIC  TROPONIN I (HIGH SENSITIVITY)    EKG EKG Interpretation  Date/Time:  Sunday July 21 2019 15:44:22 EDT Ventricular Rate:  64 PR Interval:    QRS Duration: 129 QT Interval:  428 QTC Calculation: 442 R Axis:   -61 Text Interpretation: Sinus rhythm Left bundle branch block No significant change since last tracing Confirmed by Wandra Arthurs [phone]) on 07/21/2019 3:53:45 PM   Radiology No results found.  Procedures Procedures (including critical care time)  Medications Ordered in ED Medications  sodium chloride 0.9 % bolus 500 mL (has no administration in time range)    ED Course  I have reviewed the triage vital signs and the nursing notes.  Pertinent labs & imaging results that were available during my care of the patient were reviewed by me and considered in my medical decision making (see chart for details).    MDM Rules/Calculators/A&P                      ELLIEANNA FUNDERBURG is a 69 y.o. female here with epigastric pain.  Patient was noted to be hypotensive in the ED.  Patient did take extra dose of her Lasix however.  Given history of dissection with a graft, will get dissection study.  We will also get troponin to rule out MI as well.  7:11 PM Labs unremarkable.  CT showed stable dissection.  Troponin negative x2.  Tolerated p.o. in ED.  Stable for discharge.  Final Clinical Impression(s) / ED Diagnoses Final diagnoses:  None    Rx / DC Orders ED Discharge Orders    None       Drenda Kohrs, MD 07/21/19 [phone]

## 2019-10-04 ENCOUNTER — Emergency Department: Payer: Medicare Other

## 2019-10-04 ENCOUNTER — Other Ambulatory Visit: Payer: Self-pay

## 2019-10-04 ENCOUNTER — Inpatient Hospital Stay: Payer: Medicare Other

## 2019-10-04 ENCOUNTER — Inpatient Hospital Stay
Admission: EM | Admit: 2019-10-04 | Discharge: 2019-10-06 | DRG: 872 | Disposition: A | Discharge status: Home / Self Care | Payer: Medicare Other | Attending: Internal Medicine | Admitting: Internal Medicine

## 2019-10-04 DIAGNOSIS — L03211 Cellulitis of face: Secondary | ICD-10-CM | POA: Diagnosis present

## 2019-10-04 DIAGNOSIS — Z7901 Long term (current) use of anticoagulants: Secondary | ICD-10-CM

## 2019-10-04 DIAGNOSIS — A419 Sepsis, unspecified organism: Secondary | ICD-10-CM | POA: Diagnosis present

## 2019-10-04 DIAGNOSIS — Z87891 Personal history of nicotine dependence: Secondary | ICD-10-CM

## 2019-10-04 DIAGNOSIS — R519 Headache, unspecified: Secondary | ICD-10-CM | POA: Diagnosis present

## 2019-10-04 DIAGNOSIS — R6520 Severe sepsis without septic shock: Secondary | ICD-10-CM | POA: Diagnosis not present

## 2019-10-04 DIAGNOSIS — Z8673 Personal history of transient ischemic attack (TIA), and cerebral infarction without residual deficits: Secondary | ICD-10-CM | POA: Diagnosis not present

## 2019-10-04 DIAGNOSIS — I700 Atherosclerosis of aorta: Secondary | ICD-10-CM | POA: Diagnosis present

## 2019-10-04 DIAGNOSIS — F329 Major depressive disorder, single episode, unspecified: Secondary | ICD-10-CM | POA: Diagnosis present

## 2019-10-04 DIAGNOSIS — I252 Old myocardial infarction: Secondary | ICD-10-CM

## 2019-10-04 DIAGNOSIS — I480 Paroxysmal atrial fibrillation: Secondary | ICD-10-CM | POA: Diagnosis present

## 2019-10-04 DIAGNOSIS — Z90710 Acquired absence of both cervix and uterus: Secondary | ICD-10-CM

## 2019-10-04 DIAGNOSIS — I5022 Chronic systolic (congestive) heart failure: Secondary | ICD-10-CM | POA: Diagnosis present

## 2019-10-04 DIAGNOSIS — I255 Ischemic cardiomyopathy: Secondary | ICD-10-CM | POA: Diagnosis present

## 2019-10-04 DIAGNOSIS — E041 Nontoxic single thyroid nodule: Secondary | ICD-10-CM | POA: Diagnosis present

## 2019-10-04 DIAGNOSIS — Z95810 Presence of automatic (implantable) cardiac defibrillator: Secondary | ICD-10-CM | POA: Diagnosis not present

## 2019-10-04 DIAGNOSIS — Z794 Long term (current) use of insulin: Secondary | ICD-10-CM | POA: Diagnosis not present

## 2019-10-04 DIAGNOSIS — Z8659 Personal history of other mental and behavioral disorders: Secondary | ICD-10-CM

## 2019-10-04 DIAGNOSIS — Z8249 Family history of ischemic heart disease and other diseases of the circulatory system: Secondary | ICD-10-CM | POA: Diagnosis not present

## 2019-10-04 DIAGNOSIS — E785 Hyperlipidemia, unspecified: Secondary | ICD-10-CM | POA: Diagnosis present

## 2019-10-04 DIAGNOSIS — Z86718 Personal history of other venous thrombosis and embolism: Secondary | ICD-10-CM

## 2019-10-04 DIAGNOSIS — R21 Rash and other nonspecific skin eruption: Secondary | ICD-10-CM | POA: Diagnosis present

## 2019-10-04 DIAGNOSIS — G4733 Obstructive sleep apnea (adult) (pediatric): Secondary | ICD-10-CM | POA: Diagnosis present

## 2019-10-04 DIAGNOSIS — Z8679 Personal history of other diseases of the circulatory system: Secondary | ICD-10-CM

## 2019-10-04 DIAGNOSIS — Z8674 Personal history of sudden cardiac arrest: Secondary | ICD-10-CM

## 2019-10-04 DIAGNOSIS — E1151 Type 2 diabetes mellitus with diabetic peripheral angiopathy without gangrene: Secondary | ICD-10-CM | POA: Diagnosis present

## 2019-10-04 DIAGNOSIS — E119 Type 2 diabetes mellitus without complications: Secondary | ICD-10-CM

## 2019-10-04 DIAGNOSIS — Z95820 Peripheral vascular angioplasty status with implants and grafts: Secondary | ICD-10-CM

## 2019-10-04 DIAGNOSIS — I2510 Atherosclerotic heart disease of native coronary artery without angina pectoris: Secondary | ICD-10-CM | POA: Diagnosis present

## 2019-10-04 DIAGNOSIS — Z7982 Long term (current) use of aspirin: Secondary | ICD-10-CM | POA: Diagnosis not present

## 2019-10-04 DIAGNOSIS — Z8719 Personal history of other diseases of the digestive system: Secondary | ICD-10-CM | POA: Diagnosis not present

## 2019-10-04 DIAGNOSIS — Z955 Presence of coronary angioplasty implant and graft: Secondary | ICD-10-CM

## 2019-10-04 DIAGNOSIS — I110 Hypertensive heart disease with heart failure: Secondary | ICD-10-CM | POA: Diagnosis present

## 2019-10-04 DIAGNOSIS — Z7902 Long term (current) use of antithrombotics/antiplatelets: Secondary | ICD-10-CM

## 2019-10-04 DIAGNOSIS — Z20822 Contact with and (suspected) exposure to covid-19: Secondary | ICD-10-CM | POA: Diagnosis present

## 2019-10-04 DIAGNOSIS — I7100 Dissection of unspecified site of aorta: Secondary | ICD-10-CM | POA: Diagnosis present

## 2019-10-04 DIAGNOSIS — Z79899 Other long term (current) drug therapy: Secondary | ICD-10-CM

## 2019-10-04 LAB — PROCALCITONIN: Procalcitonin: 0.23 ng/mL

## 2019-10-04 LAB — CBC WITH DIFFERENTIAL/PLATELET
Abs Immature Granulocytes: 0.11 10*3/uL — ABNORMAL HIGH (ref 0.00–0.07)
Basophils Absolute: 0.1 10*3/uL (ref 0.0–0.1)
Basophils Relative: 0 %
Eosinophils Absolute: 0 10*3/uL (ref 0.0–0.5)
Eosinophils Relative: 0 %
HCT: 35.8 % — ABNORMAL LOW (ref 36.0–46.0)
Hemoglobin: 11.3 g/dL — ABNORMAL LOW (ref 12.0–15.0)
Immature Granulocytes: 1 %
Lymphocytes Relative: 7 %
Lymphs Abs: 1.2 10*3/uL (ref 0.7–4.0)
MCH: 26.2 pg (ref 26.0–34.0)
MCHC: 31.6 g/dL (ref 30.0–36.0)
MCV: 82.9 fL (ref 80.0–100.0)
Monocytes Absolute: 1.3 10*3/uL — ABNORMAL HIGH (ref 0.1–1.0)
Monocytes Relative: 7 %
Neutro Abs: 15.9 10*3/uL — ABNORMAL HIGH (ref 1.7–7.7)
Neutrophils Relative %: 85 %
Platelets: 222 10*3/uL (ref 150–400)
RBC: 4.32 MIL/uL (ref 3.87–5.11)
RDW: 15.9 % — ABNORMAL HIGH (ref 11.5–15.5)
WBC: 18.6 10*3/uL — ABNORMAL HIGH (ref 4.0–10.5)
nRBC: 0 % (ref 0.0–0.2)

## 2019-10-04 LAB — COMPREHENSIVE METABOLIC PANEL
ALT: 18 U/L (ref 0–44)
AST: 23 U/L (ref 15–41)
Albumin: 4.2 g/dL (ref 3.5–5.0)
Alkaline Phosphatase: 108 U/L (ref 38–126)
Anion gap: 13 (ref 5–15)
BUN: 22 mg/dL (ref 8–23)
CO2: 24 mmol/L (ref 22–32)
Calcium: 9.1 mg/dL (ref 8.9–10.3)
Chloride: 98 mmol/L (ref 98–111)
Creatinine, Ser: 0.81 mg/dL (ref 0.44–1.00)
GFR calc Af Amer: >60 mL/min (ref >60)
GFR calc non Af Amer: >60 mL/min (ref >60)
Glucose, Bld: 200 mg/dL — ABNORMAL HIGH (ref 70–99)
Potassium: 4.2 mmol/L (ref 3.5–5.1)
Sodium: 135 mmol/L (ref 135–145)
Total Bilirubin: 1 mg/dL (ref 0.3–1.2)
Total Protein: 7.6 g/dL (ref 6.5–8.1)

## 2019-10-04 LAB — URINALYSIS, COMPLETE (UACMP) WITH MICROSCOPIC
Bilirubin Urine: NEGATIVE
Glucose, UA: NEGATIVE mg/dL
Hgb urine dipstick: NEGATIVE
Ketones, ur: 20 mg/dL — AB
Leukocytes,Ua: NEGATIVE
Nitrite: NEGATIVE
Protein, ur: NEGATIVE mg/dL
Specific Gravity, Urine: 1.028 (ref 1.005–1.030)
pH: 5 (ref 5.0–8.0)

## 2019-10-04 LAB — LACTIC ACID, PLASMA
Lactic Acid, Venous: 2 mmol/L (ref 0.5–1.9)
Lactic Acid, Venous: 0.8 mmol/L (ref 0.5–1.9)
Lactic Acid, Venous: 1.2 mmol/L (ref 0.5–1.9)
Lactic Acid, Venous: 1 mmol/L (ref 0.5–1.9)

## 2019-10-04 LAB — GLUCOSE, CAPILLARY
Glucose-Capillary: 253 mg/dL — ABNORMAL HIGH (ref 70–99)
Glucose-Capillary: 191 mg/dL — ABNORMAL HIGH (ref 70–99)

## 2019-10-04 LAB — HIV ANTIBODY (ROUTINE TESTING W REFLEX): HIV Screen 4th Generation wRfx: NONREACTIVE

## 2019-10-04 LAB — SEDIMENTATION RATE: Sed Rate: 47 mm/hr — ABNORMAL HIGH (ref 0–30)

## 2019-10-04 LAB — C-REACTIVE PROTEIN: CRP: 11.1 mg/dL — ABNORMAL HIGH (ref <1.0)

## 2019-10-04 LAB — SARS CORONAVIRUS 2 BY RT PCR (HOSPITAL ORDER, PERFORMED IN ~~LOC~~ HOSPITAL LAB): SARS Coronavirus 2: NEGATIVE

## 2019-10-04 LAB — BRAIN NATRIURETIC PEPTIDE: B Natriuretic Peptide: 216.2 pg/mL — ABNORMAL HIGH (ref 0.0–100.0)

## 2019-10-04 LAB — MRSA PCR SCREENING: MRSA by PCR: NEGATIVE

## 2019-10-04 MED ORDER — IOHEXOL 300 MG/ML  SOLN
75.0000 mL | Freq: Once | INTRAMUSCULAR | Status: AC | PRN
  Administered 2019-10-04: 75 mL via INTRAVENOUS
  Filled 2019-10-04: qty 75

## 2019-10-04 MED ORDER — ALBUTEROL SULFATE (2.5 MG/3ML) 0.083% IN NEBU: 3.0000 mL | INHALATION_SOLUTION | RESPIRATORY_TRACT | Status: DC | PRN

## 2019-10-04 MED ORDER — FLUTICASONE PROPIONATE 50 MCG/ACT NA SUSP
1.0000 | Freq: Every day | NASAL | Status: DC
  Filled 2019-10-04: qty 16

## 2019-10-04 MED ORDER — ATORVASTATIN CALCIUM 20 MG PO TABS
40.0000 mg | ORAL_TABLET | Freq: Every day | ORAL | Status: DC
  Administered 2019-10-04 – 2019-10-05 (×2): 40 mg via ORAL
  Filled 2019-10-04 (×2): qty 2

## 2019-10-04 MED ORDER — SODIUM CHLORIDE 0.9 % IV BOLUS
1500.0000 mL | Freq: Once | INTRAVENOUS | Status: AC
  Administered 2019-10-04: 1500 mL via INTRAVENOUS

## 2019-10-04 MED ORDER — LORATADINE 10 MG PO TABS
10.0000 mg | ORAL_TABLET | Freq: Every day | ORAL | Status: DC
  Administered 2019-10-04 – 2019-10-06 (×3): 10 mg via ORAL
  Filled 2019-10-04 (×3): qty 1

## 2019-10-04 MED ORDER — ORAL CARE MOUTH RINSE
15.0000 mL | Freq: Two times a day (BID) | OROMUCOSAL | Status: DC
  Administered 2019-10-05 – 2019-10-06 (×3): 15 mL via OROMUCOSAL

## 2019-10-04 MED ORDER — CLOPIDOGREL BISULFATE 75 MG PO TABS
75.0000 mg | ORAL_TABLET | Freq: Every day | ORAL | Status: DC
  Administered 2019-10-04 – 2019-10-06 (×3): 75 mg via ORAL
  Filled 2019-10-04 (×3): qty 1

## 2019-10-04 MED ORDER — CHLORHEXIDINE GLUCONATE CLOTH 2 % EX PADS
6.0000 | MEDICATED_PAD | Freq: Every day | CUTANEOUS | Status: DC
  Administered 2019-10-04 – 2019-10-05 (×2): 6 via TOPICAL

## 2019-10-04 MED ORDER — SODIUM CHLORIDE 0.9 % IV SOLN
2.0000 g | Freq: Once | INTRAVENOUS | Status: AC
  Administered 2019-10-04: 2 g via INTRAVENOUS
  Filled 2019-10-04: qty 20

## 2019-10-04 MED ORDER — NOREPINEPHRINE 4 MG/250ML-% IV SOLN: 2.0000 ug/min | INTRAVENOUS | Status: DC

## 2019-10-04 MED ORDER — VANCOMYCIN HCL 2000 MG/400ML IV SOLN
2000.0000 mg | Freq: Once | INTRAVENOUS | Status: AC
  Administered 2019-10-04: 2000 mg via INTRAVENOUS
  Filled 2019-10-04: qty 400

## 2019-10-04 MED ORDER — INSULIN DETEMIR 100 UNIT/ML ~~LOC~~ SOLN
20.0000 [IU] | Freq: Every day | SUBCUTANEOUS | Status: DC
  Administered 2019-10-04 – 2019-10-06 (×3): 20 [IU] via SUBCUTANEOUS
  Filled 2019-10-04 (×3): qty 0.2

## 2019-10-04 MED ORDER — SODIUM CHLORIDE 0.9 % IV SOLN
2.0000 g | INTRAVENOUS | Status: DC
  Filled 2019-10-04: qty 20

## 2019-10-04 MED ORDER — ACETAMINOPHEN 325 MG PO TABS
650.0000 mg | ORAL_TABLET | Freq: Four times a day (QID) | ORAL | Status: DC | PRN
  Administered 2019-10-04: 650 mg via ORAL
  Filled 2019-10-04: qty 2

## 2019-10-04 MED ORDER — ADULT MULTIVITAMIN W/MINERALS CH
1.0000 | ORAL_TABLET | Freq: Every day | ORAL | Status: DC
  Administered 2019-10-04 – 2019-10-06 (×3): 1 via ORAL
  Filled 2019-10-04 (×3): qty 1

## 2019-10-04 MED ORDER — SODIUM CHLORIDE 0.9 % IV SOLN: 250.0000 mL | INTRAVENOUS | Status: DC

## 2019-10-04 MED ORDER — INSULIN ASPART 100 UNIT/ML ~~LOC~~ SOLN
0.0000 [IU] | SUBCUTANEOUS | Status: DC
  Administered 2019-10-04: 5 [IU] via SUBCUTANEOUS
  Administered 2019-10-04: 2 [IU] via SUBCUTANEOUS
  Administered 2019-10-05: 3 [IU] via SUBCUTANEOUS
  Administered 2019-10-05: 2 [IU] via SUBCUTANEOUS
  Administered 2019-10-05: 3 [IU] via SUBCUTANEOUS
  Administered 2019-10-05: 2 [IU] via SUBCUTANEOUS
  Administered 2019-10-05 – 2019-10-06 (×2): 1 [IU] via SUBCUTANEOUS
  Filled 2019-10-04 (×8): qty 1

## 2019-10-04 MED ORDER — CEFAZOLIN SODIUM-DEXTROSE 2-4 GM/100ML-% IV SOLN
2.0000 g | Freq: Three times a day (TID) | INTRAVENOUS | Status: DC
  Administered 2019-10-04 – 2019-10-06 (×5): 2 g via INTRAVENOUS
  Filled 2019-10-04 (×7): qty 100

## 2019-10-04 MED ORDER — SODIUM CHLORIDE 0.9 % IV SOLN: 2.0000 g | Freq: Once | INTRAVENOUS | Status: DC

## 2019-10-04 MED ORDER — VANCOMYCIN HCL IN DEXTROSE 1-5 GM/200ML-% IV SOLN
1000.0000 mg | Freq: Once | INTRAVENOUS | Status: DC
  Filled 2019-10-04: qty 200

## 2019-10-04 MED ORDER — SODIUM CHLORIDE 0.9 % IV BOLUS
500.0000 mL | Freq: Once | INTRAVENOUS | Status: AC
  Administered 2019-10-04: 500 mL via INTRAVENOUS

## 2019-10-04 MED ORDER — DOXYCYCLINE HYCLATE 100 MG PO TABS
100.0000 mg | ORAL_TABLET | Freq: Two times a day (BID) | ORAL | Status: DC
  Filled 2019-10-04: qty 1

## 2019-10-04 MED ORDER — ONDANSETRON HCL 4 MG/2ML IJ SOLN: 4.0000 mg | Freq: Three times a day (TID) | INTRAMUSCULAR | Status: DC | PRN

## 2019-10-04 MED ORDER — FLUOXETINE HCL 20 MG PO CAPS
80.0000 mg | ORAL_CAPSULE | Freq: Every day | ORAL | Status: DC
  Administered 2019-10-05 – 2019-10-06 (×2): 80 mg via ORAL
  Filled 2019-10-04 (×2): qty 4

## 2019-10-04 MED ORDER — ACETAMINOPHEN 500 MG PO TABS
1000.0000 mg | ORAL_TABLET | Freq: Once | ORAL | Status: AC
  Administered 2019-10-04: 1000 mg via ORAL
  Filled 2019-10-04: qty 2

## 2019-10-04 MED ORDER — RIVAROXABAN 20 MG PO TABS
20.0000 mg | ORAL_TABLET | Freq: Every day | ORAL | Status: DC
  Administered 2019-10-04 – 2019-10-05 (×2): 20 mg via ORAL
  Filled 2019-10-04 (×4): qty 1

## 2019-10-04 MED ORDER — CLINDAMYCIN PHOSPHATE 600 MG/50ML IV SOLN
600.0000 mg | Freq: Three times a day (TID) | INTRAVENOUS | Status: DC
  Administered 2019-10-04 – 2019-10-06 (×5): 600 mg via INTRAVENOUS
  Filled 2019-10-04 (×7): qty 50

## 2019-10-04 NOTE — Consult Note (Signed)
PHARMACY -  BRIEF ANTIBIOTIC NOTE   Pharmacy has received consult(s) for vancomycin from an ED provider.  The patient's profile has been reviewed for ht/wt/allergies/indication/available labs.    One time order(s) placed for vancomycin 2000 mg IV x 1  Further antibiotics/pharmacy consults should be ordered by admitting physician if indicated.                       Thank you,  Lowella Bandy 10/04/2019  12:02 PM

## 2019-10-04 NOTE — ED Notes (Signed)
Pharmacy in with pt.

## 2019-10-04 NOTE — ED Provider Notes (Signed)
[facility] Emergency Department Provider Note   ____________________________________________   First MD Initiated Contact with Patient 10/04/19 1154     (approximate)  I have reviewed the triage vital signs and the nursing notes.   HISTORY  Chief Complaint Weakness    HPI Desiree Mccall is a 69 y.o. female here for evaluation of fatigue  Patient reports that she started noticing she was feeling very fatigued over the last 2 to 3 days she started noticing generalized weakness as well as a rash over the right cheek area.  She reports that she started noticing some small blistering in that area as well  Denies chest pain or trouble breathing.  Increased fatigue in general but no focal symptoms.  No abdominal pain.  No nausea vomiting.  Previous history of cardiac arrest, aortic dissection   Past Medical History:  Diagnosis Date  . Aortic dissection (HCC) 2010  . CHF (congestive heart failure) (HCC)   . Diabetes mellitus without complication (HCC)   . [name]   . Heart attack (HCC) sept. 25, 2010    Patient Active Problem List   Diagnosis Date Noted  . Sepsis (HCC) 10/04/2019  . Facial cellulitis 10/04/2019  . Facial rash 10/04/2019  . Diabetes mellitus without complication (HCC) 10/04/2019  . COVID-19 virus infection 04/02/2019  . COVID-19   . Acute respiratory failure (HCC)   . Sepsis with acute hypoxic respiratory failure without septic shock (HCC) 03/30/2019  . Fever of unknown origin 02/27/2019  . Chronic systolic CHF (congestive heart failure) (HCC) 05/28/2018  . Screening for malignant neoplasm of respiratory organ 07/19/2017  . H/O acute myocardial infarction 05/30/2017  . H/O: depression 05/30/2017  . Severe tobacco use disorder 05/30/2017  . Carotid stenosis, asymptomatic, left 05/04/2017  . OSA on CPAP 05/04/2017  . 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 Cat bite of forearm 11/22/2014  . Diabetes mellitus  type 2, noninsulin dependent (HCC) 05/08/2014  . Syncope 04/10/2013  . Cardiac arrest (HCC) 04/10/2013  . Automatic implantable cardioverter-defibrillator in situ 07/13/2012  . Aortic dissection (HCC) 11/22/2011  . CAD (coronary artery disease) 11/22/2011  . Dyslipidemia 11/22/2011  . History of stroke 11/22/2011  . Ischemic cardiomyopathy 11/22/2011  . Lumbar disc disease 11/22/2011  . PAD (peripheral artery disease) (HCC) 11/22/2011  . Pancreatitis due to common bile duct stone 11/22/2011    Past Surgical History:  Procedure Laterality Date  . ABDOMINAL HYSTERECTOMY    . CARDIAC SURGERY    . CAROTID STENT  2010  . CHOLECYSTECTOMY    . LEFT HEART CATH AND CORONARY ANGIOGRAPHY N/A 06/05/2018   Procedure: LEFT HEART CATH AND CORONARY ANGIOGRAPHY;  Surgeon: Laurier Nancy, MD;  Location: [facility] INVASIVE CV LAB;  Service: Cardiovascular;  Laterality: N/A;  . STENT PLACE LEFT URETER ([facility] HX)      Prior to Admission medications   Medication Sig Start Date End Date Taking? Authorizing Provider  acetaminophen (TYLENOL) 325 MG tablet Take 2 tablets (650 mg total) by mouth every 4 (four) hours as needed for mild pain (temp > 101.5). 06/05/18   Harlon Ditty D, NP  albuterol (PROVENTIL) (2.5 MG/3ML) 0.083% nebulizer solution Take 2.5 mg by nebulization every 2 (two) hours as needed for wheezing or shortness of breath.    [provider]  aspirin EC 81 MG tablet Take 81 mg by mouth daily. 10/29/18 10/29/19  [provider]  atorvastatin (LIPITOR) 40 MG tablet Take 1 tablet (40 mg total) by  mouth daily at 6 PM. 06/06/18   Judithe ModestKeene, Jeremiah D, NP  bisacodyl (BISACODYL LAXATIVE) 10 MG suppository Place 10 mg rectally daily as needed for moderate constipation.    [provider]  budesonide (PULMICORT) 0.5 MG/2ML nebulizer solution Take 0.5 mg by nebulization 2 (two) times daily.    [provider]  clopidogrel (PLAVIX) 75 MG tablet Take 75 mg by mouth daily.     [provider]  docusate sodium (COLACE) 100 MG capsule Take 100 mg by mouth daily as needed for mild constipation.    [provider]  doxycycline (VIBRA-TABS) 100 MG tablet Take 1 tablet (100 mg total) by mouth every 12 (twelve) hours. 04/07/19   Leroy SeaSingh, Prashant K, MD  famotidine (PEPCID) 20 MG tablet Take 20 mg by mouth 2 (two) times daily.    [provider]  FLUoxetine (PROZAC) 20 MG capsule Take 40 mg by mouth daily.  03/12/15   [provider]  fluticasone (FLONASE) 50 MCG/ACT nasal spray Place 1 spray into both nostrils daily.    [provider]  furosemide (LASIX) 40 MG tablet Take 1 tablet (40 mg total) by mouth daily. 04/07/19   Leroy SeaSingh, Prashant K, MD  insulin detemir (LEVEMIR) 100 UNIT/ML injection Inject 0.3 mLs (30 Units total) into the skin daily. 04/04/19   Ghimire, Werner LeanShanker M, MD  ipratropium-albuterol (DUONEB) 0.5-2.5 (3) MG/3ML SOLN Take 3 mLs by nebulization every 4 (four) hours as needed (wheezing or shortness of breath).    [provider]  losartan (COZAAR) 50 MG tablet Take 100 mg by mouth daily. 01/02/19   [provider]  metFORMIN (GLUCOPHAGE) 1000 MG tablet Take 1 tablet (1,000 mg total) by mouth daily with breakfast. 04/04/19 04/03/20  Ghimire, Werner LeanShanker M, MD  metoprolol succinate (TOPROL-XL) 100 MG 24 hr tablet Take 100 mg by mouth daily. 10/22/18   [provider]  montelukast (SINGULAIR) 10 MG tablet Take 10 mg by mouth at bedtime.    [provider]  Multiple Vitamin (MULTIVITAMIN WITH MINERALS) TABS tablet Take 1 tablet by mouth daily.    [provider]  pantoprazole (PROTONIX) 40 MG tablet Take 40 mg by mouth daily.    [provider]  rivaroxaban (XARELTO) 20 MG TABS tablet Take 20 mg by mouth daily with supper.    [provider]  sennosides-docusate sodium (SENOKOT-S) 8.6-50 MG tablet Take 1 tablet by mouth 2 (two) times daily as needed for constipation.     [provider]  spironolactone (ALDACTONE) 25 MG tablet Take 1 tablet (25 mg total) by mouth daily. 04/07/19   Leroy SeaSingh, Prashant K, MD    Allergies Patient has no known allergies.  Family History  Problem Relation Age of Onset  . Heart failure Mother   . Heart failure Father     Social History Social History   Tobacco Use  . Smoking status: Former Smoker    Packs/day: 0.50    Types: Cigarettes    Start date: 2012  . Smokeless tobacco: Never Used  Substance Use Topics  . Alcohol use: Yes    Comment: occasionally  . Drug use: No    Review of Systems Constitutional: Generalized fatigue unsure if she is having any fever but does feel like she is having some chills Eyes: No visual changes. ENT: No sore throat. Cardiovascular: Denies chest pain. Respiratory: Denies shortness of breath. Gastrointestinal: No abdominal pain.   Genitourinary: Negative for dysuria. Musculoskeletal: Negative for back pain. Skin: She has noticed a  rash developing over her right cheek.  She has an old surgical site there from the 1970s, but also has seen that she is having now redness and some blistering around that cheek Neurological: Negative for headaches, areas of focal weakness or numbness.    ____________________________________________   PHYSICAL EXAM:  VITAL SIGNS: ED Triage Vitals  Enc Vitals Group     BP 10/04/19 1133 (!) 98/43     Pulse Rate 10/04/19 1133 80     Resp 10/04/19 1133 (!) 26     Temp 10/04/19 1133 (!) 103.1 F (39.5 C)     Temp Source 10/04/19 1133 Oral     SpO2 10/04/19 1133 95 %     Weight 10/04/19 1130 225 lb 8.5 oz (102.3 kg)     Height 10/04/19 1130 5\' 8"  (1.727 m)     Head Circumference --      Peak Flow --      Pain Score --      Pain Loc --      Pain Edu? --      Excl. in GC? --     Constitutional: Alert and oriented. Well appearing and in no acute distress.  Does appear somewhat fatigued. Eyes: Conjunctivae are normal. Head:  Atraumatic. Nose: No congestion/rhinnorhea. Mouth/Throat: Mucous membranes are moist.  Patient has an erythematous and warm to touch rash overlying her right maxillary sinus region and cheek.  There is associated with slight but appear to be's purulent or golden in color small blisters.  Does not appear to be obviously dermatomal does not appear like a dew drop on a rose petal pattern.  Concerning for appearance that looks somewhat like impetigo Neck: No stridor.  Cardiovascular: Normal rate, regular rhythm. Grossly normal heart sounds.  Good peripheral circulation. Respiratory: Normal respiratory effort.  No retractions. Lungs CTAB. Gastrointestinal: Soft and nontender. No distention. Musculoskeletal: No lower extremity tenderness nor edema. Neurologic:  Normal speech and language. No gross focal neurologic deficits are appreciated.  Skin:  Skin is warm, dry and intact. No rash noted. Psychiatric: Mood and affect are normal. Speech and behavior are normal.  ____________________________________________   LABS (all labs ordered are listed, but only abnormal results are displayed)  Labs Reviewed  COMPREHENSIVE METABOLIC PANEL - Abnormal; Notable for the following components:      Result Value   Glucose, Bld 200 (*)    All other components within normal limits  LACTIC ACID, PLASMA - Abnormal; Notable for the following components:   Lactic Acid, Venous 2.0 (*)    All other components within normal limits  CBC WITH DIFFERENTIAL/PLATELET - Abnormal; Notable for the following components:   WBC 18.6 (*)    Hemoglobin 11.3 (*)    HCT 35.8 (*)    RDW 15.9 (*)    Neutro Abs 15.9 (*)    Monocytes Absolute 1.3 (*)    Abs Immature Granulocytes 0.11 (*)    All other components within normal limits  CULTURE, BLOOD (ROUTINE X 2)  CULTURE, BLOOD (ROUTINE X 2)  SARS CORONAVIRUS 2 BY RT PCR (HOSPITAL ORDER, PERFORMED IN [facility])  LACTIC ACID, PLASMA  URINALYSIS, COMPLETE (UACMP)  WITH MICROSCOPIC   ____________________________________________  EKG  Reviewed inter by me at 1135 Heart rate 89 QRS 120 QTc 440 Left ventricular hypertrophy.  Probable old anteroseptal infarct.  There is ST elevation noted in V3 and V4 which is consistent with previous EKGs.  I do not see evidence of acute ischemia as compared  with previous.  Of note the patient also reports no chest pain or trouble breathing. ____________________________________________  RADIOLOGY  DG Chest Portable 1 View  Result Date: 10/04/2019 CLINICAL DATA:  Fever. EXAM: PORTABLE CHEST 1 VIEW COMPARISON:  CT 07/21/2019. FINDINGS: Cardiac pacer with lead tip over the right atrium and right ventricle. Cardiomegaly. Aortic stent graft noted. Mild bilateral interstitial prominence. Mild CHF cannot be excluded. No pleural effusion or pneumothorax. IMPRESSION: 1. Cardiac pacer with lead tip over the right atrium and right ventricle. Cardiomegaly. Aortic stent graft noted. 2. Mild bilateral interstitial prominence. Mild CHF cannot be excluded. Electronically Signed   By: Maisie Fus  Register   On: 10/04/2019 12:35    Chest x-ray reviewed, bilateral interstitial prominence possible mild CHF ____________________________________________   PROCEDURES  Procedure(s) performed: None  Procedures  Critical Care performed: Yes, see critical care note(s)  CRITICAL CARE Performed by: Sharyn Creamer   Total critical care time: Patient required 35 minutes  Critical care time was exclusive of separately billable procedures and treating other patients.  Critical care was necessary to treat or prevent imminent or life-threatening deterioration.  Critical care was time spent personally by me on the following activities: development of treatment plan with patient and/or surrogate as well as nursing, discussions with consultants, evaluation of patient's response to treatment, examination of patient, obtaining history from patient or  surrogate, ordering and performing treatments and interventions, ordering and review of laboratory studies, ordering and review of radiographic studies, pulse oximetry and re-evaluation of patient's condition.  Patient presents with evidence of sepsis including elevated white count, febrile, tachypneic.  Initially thought to have developed significant hypotension in the ER, but after discussing with the patient she reports history of low blood pressures in her left arm since her previous dissection, advises use of the right arm.  On the right arm her blood pressures are much improved.  She has no active chest or cardiac/pulmonary symptoms.  She has a rash over the right cheek which I am suspicious may represent strep or other acute infectious etiology including potentially impetigo.  She has a history of previous sepsis, I placed a consultation as well to infectious disease Dr. Rivka Safer (seen via [facility])  ____________________________________________   INITIAL IMPRESSION / ASSESSMENT AND PLAN / ED COURSE  Pertinent labs & imaging results that were available during my care of the patient were reviewed by me and considered in my medical decision making (see chart for details).     Clinical Course as of Oct 04 1406  Fri Oct 04, 2019  1317 Patient's blood pressure is down trended to 81/53 now 70/40.  She is alert and oriented.  She does have a significant history of reduced ejection fraction, but today with fever rash over the right side of her face I am very concerned about strep or staphylococcal infection.  Broad-spectrum antibiotics ordered, code sepsis has been initiated.  As we fluid resuscitate her, I have also ordered peripheral vasopressor.  Consultation has now been placed also to [facility] physician for ICU admit request (Dr. Karna Christmas).    [MQ]    Clinical Course User Index [MQ] Sharyn Creamer, MD   ----------------------------------------- 2:07 PM on  10/04/2019 -----------------------------------------  Patient be admitted to the hospitalist service with consultation by CCM and infectious disease team following  ED Sepsis - Repeat Assessment   Performed at:    approximatlely 2pm  Last Vitals:    Blood pressure (!) 138/56, pulse 78, temperature (!) 102.5 F (39.2 C), resp. rate (!) 24,  height 5\' 8"  (1.727 m), weight 102.3 kg, SpO2 97 %.  Heart:      Normal rate  Lungs:     Normal respiratory pattern, denies dyspnea  Capillary Refill:   Normal  ----------------------------------------- 2:45 PM on 10/04/2019 -----------------------------------------  Patient admitted to hospitalist service with ICU team consulting as well as infectious disease.  Patient notified us that her blood pressures consistently run low in her left arm since her dissection, on her right arm she is normotensive to just slightly hypertensive.  Peripheral pressors discontinued.     ____________________________________________   FINAL CLINICAL IMPRESSION(S) / ED DIAGNOSES  Final diagnoses:  Sepsis, due to unspecified organism, unspecified whether acute organ dysfunction present [facility])  Rash of face        Note:  This document was prepared using Dragon voice recognition software and may include unintentional dictation errors       Delman Kitten, MD 10/04/19 1446

## 2019-10-04 NOTE — ED Notes (Addendum)
Pt resting quietly waiting on MRI to contact if safe to scan pt. Pt denies any needs at this time. Urine sent to lab. VSS. Pt temp is 99.1 at this time.

## 2019-10-04 NOTE — H&P (Addendum)
History and Physical    Desiree Mccall OVZ:[id number] DOB: 06-Sep-1950 DOA: 10/04/2019  Referring MD/NP/PA:   PCP: Frazier Richards, MD   Patient coming from:  The patient is coming from home.  At baseline, pt is independent for most of ADL.        Chief Complaint: facial swelling, erythema and pain  HPI: Desiree SHINGLEDECKER is a 69 y.o. 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  Patient states that she started bilateral facial pain, erythema and swelling 2 days ago, which has worsened since last night.  Patient has a generalized weakness.  Denies chest pain, shortness breath, cough.  She also has fever and chills.  She has nausea and dry heaves, but no vomiting or abdominal pain.  Denies symptoms of UTI.  No unilateral weakness.  Initially patient was found to have hypotension with blood pressure 81/53. This blood pressure was measured in left arm.  Per patient, after aortic dissection, she has low blood pressure reading in left arm.  Then the right arm blood pressure is measured, which is 138/56, therefore no hypotension.   ED Course: pt was found to have WBC 18.6, lactic acid 2.0, pending COVID-19 PCR, electrolytes renal function okay, temperature 102.5, heart rate 80, tachypnea, oxygen saturation 92 to 95% on room air.  Chest x-ray showed cardiomegaly and mild interstitial pulmonary edema.  Patient is admitted to stepdown as inpatient.  Dr. Silas Flood for infectious disease and Dr. Lanney Gins of ICU are consulted.  Review of Systems:   General: no fevers, chills, no body weight gain, has fatigue HEENT: no blurry vision, hearing changes or sore throat Respiratory: no dyspnea, coughing, wheezing CV: no chest pain, no  palpitations GI: has nausea, no vomiting, abdominal pain, diarrhea, constipation GU: no dysuria, burning on urination, increased urinary frequency, hematuria  Ext: has trace leg edema Neuro: no unilateral weakness, numbness, or tingling, no vision change or hearing loss Skin: no skin tear. Has facial swelling, erythema and pain MSK: No muscle spasm, no deformity, no limitation of range of movement in spin Heme: No easy bruising.  Travel history: No recent long distant travel.  Allergy: [name]  Past Medical History:  Diagnosis Date   Aortic dissection ([place]) 2010   CHF (congestive heart failure) (HCC)    Diabetes mellitus without complication ([place])    [name]    Heart attack ([place]) sept. 25, 2010    Past Surgical History:  Procedure Laterality Date   ABDOMINAL HYSTERECTOMY     CARDIAC SURGERY     CAROTID STENT  2010   CHOLECYSTECTOMY     LEFT HEART CATH AND CORONARY ANGIOGRAPHY N/A 06/05/2018   Procedure: LEFT HEART CATH AND CORONARY ANGIOGRAPHY;  Surgeon: Dionisio David, MD;  Location: [facility];  Service: Cardiovascular;  Laterality: N/A;   STENT PLACE LEFT URETER ([place] HX)      Social History:  reports that she has quit smoking. Her smoking use included cigarettes. She started smoking about 9 years ago. She smoked 0.50 packs per day. She has never used smokeless tobacco. She reports current alcohol use. She reports that she does not use drugs.  Family History:  Family History  Problem Relation Age of Onset   Heart failure Mother    Heart  failure Father      Prior to Admission medications   Medication Sig Start Date End Date Taking? Authorizing Provider  acetaminophen (TYLENOL) 325 MG tablet Take 2 tablets (650 mg total) by mouth every 4 (four) hours as needed for mild pain (temp > 101.5). 06/05/18   Darel Hong D, NP  albuterol (PROVENTIL) (2.5 MG/3ML) 0.083% nebulizer solution Take 2.5 mg by nebulization every 2 (two)  hours as needed for wheezing or shortness of breath.    [provider]  aspirin EC 81 MG tablet Take 81 mg by mouth daily. 10/29/18 10/29/19  [provider]  atorvastatin (LIPITOR) 40 MG tablet Take 1 tablet (40 mg total) by mouth daily at 6 PM. 06/06/18   Bradly Bienenstock, NP  bisacodyl (BISACODYL LAXATIVE) 10 MG suppository Place 10 mg rectally daily as needed for moderate constipation.    [provider]  budesonide (PULMICORT) 0.5 MG/2ML nebulizer solution Take 0.5 mg by nebulization 2 (two) times daily.    [provider]  clopidogrel (PLAVIX) 75 MG tablet Take 75 mg by mouth daily.    [provider]  docusate sodium (COLACE) 100 MG capsule Take 100 mg by mouth daily as needed for mild constipation.    [provider]  doxycycline (VIBRA-TABS) 100 MG tablet Take 1 tablet (100 mg total) by mouth every 12 (twelve) hours. 04/07/19   Thurnell Lose, MD  famotidine (PEPCID) 20 MG tablet Take 20 mg by mouth 2 (two) times daily.    [provider]  FLUoxetine (PROZAC) 20 MG capsule Take 40 mg by mouth daily.  03/12/15   [provider]  fluticasone (FLONASE) 50 MCG/ACT nasal spray Place 1 spray into both nostrils daily.    [provider]  furosemide (LASIX) 40 MG tablet Take 1 tablet (40 mg total) by mouth daily. 04/07/19   Thurnell Lose, MD  insulin detemir (LEVEMIR) 100 UNIT/ML injection Inject 0.3 mLs (30 Units total) into the skin daily. 04/04/19   Ghimire, Henreitta Leber, MD  ipratropium-albuterol (DUONEB) 0.5-2.5 (3) MG/3ML SOLN Take 3 mLs by nebulization every 4 (four) hours as needed (wheezing or shortness of breath).    [provider]  losartan (COZAAR) 50 MG tablet Take 100 mg by mouth daily. 01/02/19   [provider]  metFORMIN (GLUCOPHAGE) 1000 MG tablet Take 1 tablet (1,000 mg total) by mouth daily with breakfast. 04/04/19 04/03/20  Ghimire, Henreitta Leber, MD  metoprolol succinate (TOPROL-XL) 100  MG 24 hr tablet Take 100 mg by mouth daily. 10/22/18   [provider]  montelukast (SINGULAIR) 10 MG tablet Take 10 mg by mouth at bedtime.    [provider]  Multiple Vitamin (MULTIVITAMIN WITH MINERALS) TABS tablet Take 1 tablet by mouth daily.    [provider]  pantoprazole (PROTONIX) 40 MG tablet Take 40 mg by mouth daily.    [provider]  rivaroxaban (XARELTO) 20 MG TABS tablet Take 20 mg by mouth daily with supper.    [provider]  sennosides-docusate sodium (SENOKOT-S) 8.6-50 MG tablet Take 1 tablet by mouth 2 (two) times daily as needed for constipation.    [provider]  spironolactone (ALDACTONE) 25 MG tablet Take 1 tablet (25 mg total) by mouth daily. 04/07/19   Thurnell Lose, MD    Physical Exam: Vitals:   10/04/19 1500 10/04/19 1515 10/04/19 1530 10/04/19 1545  BP: (!) 117/54 (!) 119/53 (!) 109/59 (!) 120/57  Pulse: 72 70 71 69  Resp: (!) 26 19 (!) 37 (!) 26  Temp:      TempSrc:      SpO2: 91% 95% 95% 96%  Weight:      Height:       General: Not in acute distress HEENT:       Eyes: PERRL, EOMI, no scleral icterus.       ENT: No discharge from the ears and nose, no pharynx injection, no tonsillar enlargement.        Neck: No JVD, no bruit, no mass felt. Heme: No neck lymph node enlargement. Cardiac: S1/S2, RRR, No murmurs, No gallops or rubs. Respiratory: No rales, wheezing, rhonchi or rubs. GI: Soft, nondistended, nontender, no rebound pain, no organomegaly, BS present. GU: No hematuria Ext: has trace leg edema bilaterally. 1+DP/PT pulse bilaterally. Musculoskeletal: No joint deformities, No joint redness or warmth, no limitation of ROM in spin. Skin: Has bilateral facial swelling, erythema and tenderness, has blister in the right face. Neuro: Alert, oriented X3, cranial nerves II-XII grossly intact, moves all extremities normally. Psych: Patient is not psychotic, no suicidal or hemocidal  ideation.  Labs on Admission: I have personally reviewed following labs and imaging studies  CBC: Recent Labs  Lab 10/04/19 1147  WBC 18.6*  NEUTROABS 15.9*  HGB 11.3*  HCT 35.8*  MCV 82.9  PLT 423   Basic Metabolic Panel: Recent Labs  Lab 10/04/19 1147  NA 135  K 4.2  CL 98  CO2 24  GLUCOSE 200*  BUN 22  CREATININE 0.81  CALCIUM 9.1   GFR: Estimated Creatinine Clearance: 85.5 mL/min (by C-G formula based on SCr of 0.81 mg/dL). Liver Function Tests: Recent Labs  Lab 10/04/19 1147  AST 23  ALT 18  ALKPHOS 108  BILITOT 1.0  PROT 7.6  ALBUMIN 4.2   No results for input(s): LIPASE, AMYLASE in the last 168 hours. No results for input(s): AMMONIA in the last 168 hours. Coagulation Profile: No results for input(s): [name], PROTIME in the last 168 hours. Cardiac Enzymes: No results for input(s): CKTOTAL, CKMB, CKMBINDEX, TROPONINI in the last 168 hours. BNP (last 3 results) No results for input(s): PROBNP in the last 8760 hours. HbA1C: No results for input(s): HGBA1C in the last 72 hours. CBG: No results for input(s): GLUCAP in the last 168 hours. Lipid Profile: No results for input(s): CHOL, HDL, LDLCALC, TRIG, CHOLHDL, LDLDIRECT in the last 72 hours. Thyroid Function Tests: No results for input(s): TSH, T4TOTAL, FREET4, T3FREE, THYROIDAB in the last 72 hours. Anemia Panel: No results for input(s): VITAMINB12, FOLATE, FERRITIN, TIBC, IRON, RETICCTPCT in the last 72 hours. Urine analysis:    Component Value Date/Time   COLORURINE YELLOW (A) 10/04/2019 1533   APPEARANCEUR HAZY (A) 10/04/2019 1533   LABSPEC 1.028 10/04/2019 1533   PHURINE 5.0 10/04/2019 1533   GLUCOSEU NEGATIVE 10/04/2019 1533   HGBUR NEGATIVE 10/04/2019 1533   BILIRUBINUR NEGATIVE 10/04/2019 1533   KETONESUR 20 (A) 10/04/2019 1533   PROTEINUR NEGATIVE 10/04/2019 1533   UROBILINOGEN 0.2 09/19/2009 1113   NITRITE NEGATIVE 10/04/2019 1533   LEUKOCYTESUR NEGATIVE 10/04/2019 1533   Sepsis  Labs: _0 (procalcitonin:4,lacticidven:4) ) Recent Results (from the past 240 hour(s))  SARS Coronavirus 2 by RT PCR (hospital order, performed in [facility] hospital lab) Nasopharyngeal Nasopharyngeal Swab     Status: None   Collection Time: 10/04/19  1:25 PM   Specimen: Nasopharyngeal Swab  Result Value Ref Range Status   SARS Coronavirus 2 NEGATIVE NEGATIVE Final    Comment: (NOTE)  SARS-CoV-2 target nucleic acids are NOT DETECTED. The SARS-CoV-2 RNA is generally detectable in upper and lower respiratory specimens during the acute phase of infection. The lowest concentration of SARS-CoV-2 viral copies this assay can detect is 250 copies / mL. A negative result does not preclude SARS-CoV-2 infection and should not be used as the sole basis for treatment or other patient management decisions.  A negative result may occur with improper specimen collection / handling, submission of specimen other than nasopharyngeal swab, presence of viral mutation(s) within the areas targeted by this assay, and inadequate number of viral copies (<250 copies / mL). A negative result must be combined with clinical observations, patient history, and epidemiological information. Fact Sheet for Patients:   StrictlyIdeas.no Fact Sheet for Healthcare Providers: BankingDealers.co.za This test is not yet approved or cleared  by the [place] FDA and has been authorized for detection and/or diagnosis of SARS-CoV-2 by FDA under an Emergency Use Authorization (EUA).  This EUA will remain in effect (meaning this test can be used) for the duration of the COVID-19 declaration under Section 564(b)(1) of the Act, 21 U.S.C. section 360bbb-3(b)(1), unless the authorization is terminated or revoked sooner. Performed at [facility], [address] [place],  [address]      Radiological Exams on Admission: DG Chest Portable 1 View  Result  Date: 10/04/2019 CLINICAL DATA:  Fever. EXAM: PORTABLE CHEST 1 VIEW COMPARISON:  CT 07/21/2019. FINDINGS: Cardiac pacer with lead tip over the right atrium and right ventricle. Cardiomegaly. Aortic stent graft noted. Mild bilateral interstitial prominence. Mild CHF cannot be excluded. No pleural effusion or pneumothorax. IMPRESSION: 1. Cardiac pacer with lead tip over the right atrium and right ventricle. Cardiomegaly. Aortic stent graft noted. 2. Mild bilateral interstitial prominence. Mild CHF cannot be excluded. Electronically Signed   By: Marcello Moores  Register   On: 10/04/2019 12:35     EKG: Independently reviewed.  Sinus rhythm, QTC 438, LAD, poor R wave progression, T wave inversion in V5-V6 which is old.  Assessment/Plan Principal Problem:   Facial pain Active Problems:   Automatic implantable cardioverter-defibrillator in situ   CAD (coronary artery disease)   Dyslipidemia   H/O: depression   History of stroke   Chronic systolic CHF (congestive heart failure) (HCC)   Sepsis (HCC)   Facial cellulitis   Diabetes mellitus without complication (HCC)   Facial pain, swelling, erythema and sepsis: Differential diagnosis include facial cellulitis, facial erysipelas and impetigo.  Patient meets criteria for sepsis with leukocytosis, fever, tachypnea.  Lactic acid 2.0.  Will need to r/o venous sinus thrombosis per Dr. Lanney Gins.  Infectious disease, Dr. Delaine Lame is also consulted.  - will admit to [facility] as inpatient - Empiric antimicrobial treatment with  Rocephin and doxycycline (patient received 1 dose of vancomycin in ED) - Blood cultures x 2  - ESR and CRP - will get Procalcitonin and trend lactic acid levels per sepsis protocol. - IVF: total of 2L of NS bolus in ED. Will not give more IVF due to CHF with EF 20-25% -f/u CT-neck which is ordered by Dr. Lanney Gins  CAD (coronary artery disease): no CP -ASA, lipitor  Dyslipidemia -lipitgor  H/O: depression: No SI or HI -continue home  meds  History of stroke -Plavix and lipitor  Chronic systolic CHF (congestive heart failure) ([place]): 2D echo on 03/01/2019 showed a EF of 20-25%.  Patient has trace leg edema, but no acute respiratory distress.  CHF seem to be compensated. -Hold Lasix and spironolactone due to  sepsis -Check a BMP  Diabetes mellitus without complication ([place]): Most recent A1c , poorly fairly well controled. Patient is taking at home -will decrease Lantus dose from  30 -20 units daily -SSI  History of LAA thrombus:  -On anticoagulation with Xarelto  Aortic Dissection/2010 Endovascular Repair w/ exclusion of L. SCA, 01/2019 Redo TEVAR distal descending Aorta-July 2020 status post distal aortic TEVAR :  Per previous discharge summery, pt is followed by cardiology and cardiothoracic surgery at [facility]    DVT ppx: on Xarelto Code Status: Full code Family Communication: not done, no family member is at bed side.     Disposition Plan:  Anticipate discharge back to previous environment Consults called:  Dr. Lanney Gins of ICU and Dr. Francee Nodal of ID Admission status:  SDU/inpation           Status is: Inpatient  Remains inpatient appropriate because:Inpatient level of care appropriate due to severity of illness.  Patient has multiple severe comorbidities, including CHF with EF of 20-25%.  Now presents with facial infection.  Patient has sepsis and needs IV fluid.  Due to EF of 20-25%, patient is at high risk of developing CHF exacerbation and fluid overload.  Her presentation is highly complicated.  Patient is at high risk for deteriorating.  Will need to be treated in hospital for at least 2 days     Dispo: The patient is from: Home              Anticipated d/c is to: Home              Anticipated d/c date is: 2 days              Patient currently is not medically stable to d/c.           Date of Service 10/04/2019    Ivor Costa [facility]   If 7PM-7AM, please contact  night-coverage [email or web address] 10/04/2019, 5:15 PM

## 2019-10-04 NOTE — ED Provider Notes (Signed)
Clinically, her blood pressures have improved. Her left arm blood pressures are felt to be unreliable which is where her hypotensive measurements came from, once measuring on her right they look much better. I do not think she meets criteria for severe sepsis at this time in my opinion. Left arm blood pressures are chronically low due to a previous aortic dissection.    Sharyn Creamer, MD 10/04/19 1538

## 2019-10-04 NOTE — Consult Note (Signed)
NAME: Desiree Mccall  DOB: 1951/02/05  MRN: [id number]  Date/Time: 10/04/2019 2:58 PM  REQUESTING PROVIDER: Fanny Bien Subjective:  REASON FOR CONSULT: facial cellulitis ? Desiree Mccall is a 69 y.o. female with a history of AICD , CHF, PAF, CAD s/p stents, Aortic dissection, DM, COVID illness Dec 2020 needing intubation Brought in by EMS for fatigue, new rash on her face Pt says she has had a painful swelling on the rt side of the face for the past 2 days which got really worse this morning, she also was shaking a lot and her legs were very weak. She called EMS and came to the ED. She felt hot this morning but did not check her temp Says she had a painful itch on the inner side of her left eye and bridge of nose 3 weeks ago and thought it was her glasses pressing on the skin- she has been itching and touching her skin a lot. 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 Kizzie Bane. 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 Tonsillectomy Tubal ligation Abdominal Hysterectomy   Past Surgical History:  Procedure Laterality Date  . ABDOMINAL HYSTERECTOMY    . CARDIAC SURGERY    . CAROTID STENT  2010  . CHOLECYSTECTOMY    . LEFT HEART CATH AND CORONARY ANGIOGRAPHY N/A 06/05/2018   Procedure: LEFT HEART CATH AND CORONARY ANGIOGRAPHY;  Surgeon: Laurier Nancy, MD;  Location: [facility] INVASIVE CV LAB;   Service: Cardiovascular;  Laterality: N/A;  . STENT PLACE LEFT URETER ([facility] HX)      Social History   Socioeconomic History  . Marital status: Divorced    Spouse name: Not on file  . Number of children: Not on file  . Years of education: Not on file  . Highest education level: Not on file  Occupational History  . Not on file  Tobacco Use  . Smoking status: Former Smoker    Packs/day: 0.50    Types: Cigarettes    Start date: 2012  . Smokeless tobacco: Never Used  Substance and Sexual Activity  . Alcohol use: Yes    Comment: occasionally  . Drug use: No  . Sexual activity: Not on file  Other Topics Concern  . Not on file  Social History Narrative  . Not on file   Social Determinants of Health   Financial Resource Strain:   . Difficulty of Paying Living Expenses:   Food Insecurity:   . Worried About Programme researcher, broadcasting/film/video in the Last Year:   . Barista in the Last Year:   Transportation Needs:   . Freight forwarder (Medical):   Marland Kitchen Lack of Transportation (Non-Medical):   Physical Activity:   . Days of Exercise per Week:   . Minutes of Exercise per Session:   Stress:   . Feeling of Stress :   Social Connections:   .  Frequency of Communication with Friends and Family:   . Frequency of Social Gatherings with Friends and Family:   . Attends Religious Services:   . Active Member of Clubs or Organizations:   . Attends Banker Meetings:   Marland Kitchen Marital Status:   Intimate Partner Violence:   . Fear of Current or Ex-Partner:   . Emotionally Abused:   Marland Kitchen Physically Abused:   . Sexually Abused:     Family History  Problem Relation Age of Onset  . Heart failure Mother   . Heart failure Father    [name]  ? Current Facility-Administered Medications  Medication Dose Route Frequency Provider Last Rate Last Admin  . acetaminophen (TYLENOL) tablet 650 mg  650 mg Oral Q6H PRN Lorretta Harp, MD      . doxycycline (VIBRA-TABS) tablet 100 mg  100 mg  Oral Q12H Aleskerov, Fuad, MD      . ondansetron (ZOFRAN) injection 4 mg  4 mg Intravenous Q8H PRN Lorretta Harp, MD       Current Outpatient Medications  Medication Sig Dispense Refill  . atorvastatin (LIPITOR) 40 MG tablet Take 1 tablet (40 mg total) by mouth daily at 6 PM.    . clopidogrel (PLAVIX) 75 MG tablet Take 75 mg by mouth daily.    Marland Kitchen FLUoxetine (PROZAC) 40 MG capsule Take 40 mg by mouth 2 (two) times daily.     . furosemide (LASIX) 40 MG tablet Take 1 tablet (40 mg total) by mouth daily. 30 tablet 0  . losartan (COZAAR) 100 MG tablet Take 100 mg by mouth daily.    . metFORMIN (GLUCOPHAGE) 1000 MG tablet Take 1 tablet (1,000 mg total) by mouth daily with breakfast. 30 tablet 11  . metoprolol succinate (TOPROL-XL) 100 MG 24 hr tablet Take 100 mg by mouth daily.    . pantoprazole (PROTONIX) 40 MG tablet Take 40 mg by mouth daily.    Marland Kitchen spironolactone (ALDACTONE) 25 MG tablet Take 1 tablet (25 mg total) by mouth daily. 30 tablet 0  . acetaminophen (TYLENOL) 325 MG tablet Take 2 tablets (650 mg total) by mouth every 4 (four) hours as needed for mild pain (temp > 101.5).    Marland Kitchen albuterol (PROVENTIL) (2.5 MG/3ML) 0.083% nebulizer solution Take 2.5 mg by nebulization every 2 (two) hours as needed for wheezing or shortness of breath.    Marland Kitchen aspirin EC 81 MG tablet Take 81 mg by mouth daily.    . bisacodyl (BISACODYL LAXATIVE) 10 MG suppository Place 10 mg rectally daily as needed for moderate constipation.    . budesonide (PULMICORT) 0.5 MG/2ML nebulizer solution Take 0.5 mg by nebulization 2 (two) times daily.    Marland Kitchen docusate sodium (COLACE) 100 MG capsule Take 100 mg by mouth daily as needed for mild constipation.    Marland Kitchen doxycycline (VIBRA-TABS) 100 MG tablet Take 1 tablet (100 mg total) by mouth every 12 (twelve) hours. 10 tablet 0  . famotidine (PEPCID) 20 MG tablet Take 20 mg by mouth 2 (two) times daily.    . fluticasone (FLONASE) 50 MCG/ACT nasal spray Place 1 spray into both nostrils daily.     . insulin detemir (LEVEMIR) 100 UNIT/ML injection Inject 0.3 mLs (30 Units total) into the skin daily. 10 mL 11  . ipratropium-albuterol (DUONEB) 0.5-2.5 (3) MG/3ML SOLN Take 3 mLs by nebulization every 4 (four) hours as needed (wheezing or shortness of breath).    . montelukast (SINGULAIR) 10 MG tablet Take 10 mg by mouth at  bedtime.    . Multiple Vitamin (MULTIVITAMIN WITH MINERALS) TABS tablet Take 1 tablet by mouth daily.    . rivaroxaban (XARELTO) 20 MG TABS tablet Take 20 mg by mouth daily with supper.    . sennosides-docusate sodium (SENOKOT-S) 8.6-50 MG tablet Take 1 tablet by mouth 2 (two) times daily as needed for constipation.       Abtx:  Anti-infectives (From admission, onward)   Start     Dose/Rate Route Frequency Ordered Stop   10/04/19 1445  doxycycline (VIBRA-TABS) tablet 100 mg     100 mg Oral Every 12 hours 10/04/19 1435     10/04/19 1215  vancomycin (VANCOREADY) IVPB 2000 mg/400 mL     2,000 mg 200 mL/hr over 120 Minutes Intravenous  Once 10/04/19 1204 10/04/19 1420   10/04/19 1200  vancomycin (VANCOCIN) IVPB 1000 mg/200 mL premix  Status:  Discontinued     1,000 mg 200 mL/hr over 60 Minutes Intravenous  Once 10/04/19 1159 10/04/19 1204   10/04/19 1200  cefTRIAXone (ROCEPHIN) 2 g in sodium chloride 0.9 % 100 mL IVPB     2 g 200 mL/hr over 30 Minutes Intravenous  Once 10/04/19 1159 10/04/19 1309      REVIEW OF SYSTEMS:  Const:  fever,  chills, negative weight loss Eyes: negative diplopia or visual changes, negative eye pain ENT: negative coryza, negative sore throat Resp: negative cough, hemoptysis, dyspnea Cards: negative for chest pain, palpitations, lower extremity edema GU: negative for frequency, dysuria and hematuria GI: Negative for abdominal pain, diarrhea, bleeding, constipation Skin: as above Heme: negative for easy bruising and gum/nose bleeding MS: generalized weakness Neurolo:negative for headaches, dizziness, vertigo, memory problems  Psych:  negative for feelings of anxiety, depression  Allergy/Immunology- negative for any medication or food allergies ?  Objective:  VITALS:  BP (!) 145/41   Pulse 75   Temp 99.1 F (37.3 C)   Resp (!) 25   Ht 5\' 8"  (1.727 m)   Wt 102.3 kg   SpO2 96%   BMI 34.29 kg/m  PHYSICAL EXAM:  General: Alert, cooperative, no distress, appears stated age.  Head: Normocephalic, without obvious abnormality, atraumatic. Eyes: Conjunctivae clear, anicteric sclerae. Pupils are equal Left inner canthus some scab Rt side of the face erythematous, blister with yellow fluid , warmth and induration        ENT Nares normal. No drainage or sinus tenderness. Lips, mucosa, and tongue normal. No Thrush- poor dentition Neck: Supple, symmetrical, no adenopathy, thyroid: non tender no carotid bruit and no JVD. Back: No CVA tenderness. Lungs: Clear to auscultation bilaterally. No Wheezing or Rhonchi. No rales. Heart: irregular ICD site okay Abdomen: Soft, non-tender,not distended. Bowel sounds normal. No masses Extremities: atraumatic, no cyanosis. No edema. No clubbing Skin: as above Lymph: Cervical, supraclavicular normal. Neurologic: Grossly non-focal Pertinent Labs Lab Results CBC    Component Value Date/Time   WBC 18.6 (H) 10/04/2019 1147   RBC 4.32 10/04/2019 1147   HGB 11.3 (L) 10/04/2019 1147   HCT 35.8 (L) 10/04/2019 1147   PLT 222 10/04/2019 1147   MCV 82.9 10/04/2019 1147   MCH 26.2 10/04/2019 1147   MCHC 31.6 10/04/2019 1147   RDW 15.9 (H) 10/04/2019 1147   LYMPHSABS 1.2 10/04/2019 1147   MONOABS 1.3 (H) 10/04/2019 1147   EOSABS 0.0 10/04/2019 1147   BASOSABS 0.1 10/04/2019 1147    CMP Latest Ref Rng & Units 10/04/2019 07/21/2019 04/07/2019  Glucose 70 - 99 mg/dL 200(H) 171(H) 128(H)  BUN 8 -  23 mg/dL 22 62(B) 19  Creatinine 0.44 - 1.00 mg/dL 7.62 8.31 5.17  Sodium 135 - 145 mmol/L 135 138 142  Potassium 3.5 - 5.1 mmol/L 4.2 3.9 4.4  Chloride 98 - 111 mmol/L 98 101 101  CO2  22 - 32 mmol/L 24 27 33(H)  Calcium 8.9 - 10.3 mg/dL 9.1 9.1 9.0  Total Protein 6.5 - 8.1 g/dL 7.6 7.7 5.9(L)  Total Bilirubin 0.3 - 1.2 mg/dL 1.0 0.4 0.8  Alkaline Phos 38 - 126 U/L 108 126 89  AST 15 - 41 U/L 23 26 22   ALT 0 - 44 U/L 18 22 33      Microbiology: Recent Results (from the past 240 hour(s))  SARS Coronavirus 2 by RT PCR (hospital order, performed in [facility] hospital lab) Nasopharyngeal Nasopharyngeal Swab     Status: None   Collection Time: 10/04/19  1:25 PM   Specimen: Nasopharyngeal Swab  Result Value Ref Range Status   SARS Coronavirus 2 NEGATIVE NEGATIVE Final    Comment: (NOTE) SARS-CoV-2 target nucleic acids are NOT DETECTED. The SARS-CoV-2 RNA is generally detectable in upper and lower respiratory specimens during the acute phase of infection. The lowest concentration of SARS-CoV-2 viral copies this assay can detect is 250 copies / mL. A negative result does not preclude SARS-CoV-2 infection and should not be used as the sole basis for treatment or other patient management decisions.  A negative result may occur with improper specimen collection / handling, submission of specimen other than nasopharyngeal swab, presence of viral mutation(s) within the areas targeted by this assay, and inadequate number of viral copies (<250 copies / mL). A negative result must be combined with clinical observations, patient history, and epidemiological information. Fact Sheet for Patients:   12/04/19 Fact Sheet for Healthcare Providers: BoilerBrush.com.cy This test is not yet approved or cleared  by the https://pope.com/ FDA and has been authorized for detection and/or diagnosis of SARS-CoV-2 by FDA under an Emergency Use Authorization (EUA).  This EUA will remain in effect (meaning this test can be used) for the duration of the COVID-19 declaration under Section 564(b)(1) of the Act, 21 U.S.C. section  360bbb-3(b)(1), unless the authorization is terminated or revoked sooner. Performed at [facility], [address] [place], [place]     IMAGING RESULTS: CT neck Inflammatory stranding within the bilateral periorbital region and along the bridge of the nose extending to the right greater than left maxillofacial soft tissues and caudally within the right greater than left neck as detailed. Findings are nonspecific but suggestive of cellulitis. No organized soft tissue abscess is identified. I have personally reviewed the films ? Impression/Recommendation ? ?Facial cellulitis with fever and leucocytosis-sepsis - likely organism staph or strep- less likely MRSA as there is no purulence Has some superficial crusting like impetigo- MRSA nares neg On ceftriaxone and doxy- change to cefazolin and clindamycin  H/O Aortic dissection, s/p TEVAR  CAD/PCI -on plavix   PAF on xarelto  CHF s/p AICD  HTN  Hyperlipidemia on liptor  IDDM- on insulin   ? ___________________________________________________ Discussed with patient, requesting provider ID will follow her peripherally this weekend Note:  This document was prepared using Dragon voice recognition software and may include unintentional dictation errors.

## 2019-10-04 NOTE — ED Notes (Signed)
Doxycycline held at this time so pt will not have to take at 0400. Will give at 1900 to make a better time for pt.

## 2019-10-04 NOTE — ED Triage Notes (Addendum)
BIB by [facility] from home due to weakness and nausea ongoing since yesterday. Repeat EKG "possible STEMI" per EMS. EDP at bedside.  86/56 on EMS arrival. Redness to right side of face and rash that started last night, worsening since starting last night.

## 2019-10-04 NOTE — Consult Note (Signed)
CRITICAL CARE PROGRESS NOTE    Name: Desiree Mccall MRN: [id number] DOB: 1950-05-18     LOS: 0   SUBJECTIVE FINDINGS & SIGNIFICANT EVENTS   Patient description:  This is a pleasant 69 year old female with a history of aortic dissection many years ago, CAD, ischemic cardiomyopathy, cardiac arrest, paroxysmal A. fib, essential hypertension, OSA on CPAP, history of previous pancreatitis, with advanced systolic CHF with EF of 20 to 79%.  Patient came in due to worsening pressure around the face which initially started approximately 1 month ago with nasal bridge irritation that extended bilateral periorbital area as well as face worse on the right and progressively indurated as well as small pustular vesicles appearing over the right face.  There is a right facial open skin wound which patient states is chronic.  Additionally right neck with fluctuant area over the proximal caudal sternocleidomastoid neck.  She reports chills and diaphoresis possible subjective fevers unmeasured.  She came into ER due to worsening symptoms of facial rash over the last 24 hours.  Critical care consultation placed for initial hypotension which was transient and apparently due to history of surgical manipulation of the upper extremity, patient states she is known to have different blood pressures on each arm.  Lines / Drains: PIV x2  Cultures / Sepsis markers: Blood cultures x2  Antibiotics: Doxycycline 100 twice daily   Protocols / Consultants: Hospitalist service, ID, PCCM     PAST MEDICAL HISTORY   Past Medical History:  Diagnosis Date  . Aortic dissection (HCC) 2010  . CHF (congestive heart failure) (HCC)   . Diabetes mellitus without complication (HCC)   . [name]   . Heart attack (HCC) sept. 25, 2010      SURGICAL HISTORY   Past Surgical History:  Procedure Laterality Date  . ABDOMINAL HYSTERECTOMY    . CARDIAC SURGERY    . CAROTID STENT  2010  . CHOLECYSTECTOMY    . LEFT HEART CATH AND CORONARY ANGIOGRAPHY N/A 06/05/2018   Procedure: LEFT HEART CATH AND CORONARY ANGIOGRAPHY;  Surgeon: Laurier Nancy, MD;  Location: [facility] INVASIVE CV LAB;  Service: Cardiovascular;  Laterality: N/A;  . STENT PLACE LEFT URETER ([facility] HX)       FAMILY HISTORY   Family History  Problem Relation Age of Onset  . Heart failure Mother   . Heart failure Father      SOCIAL HISTORY   Social History   Tobacco Use  . Smoking status: Former Smoker    Packs/day: 0.50    Types: Cigarettes    Start date: 2012  . Smokeless tobacco: Never Used  Substance Use Topics  . Alcohol use: Yes    Comment: occasionally  . Drug use: No     MEDICATIONS   Current Medication:  Current Facility-Administered Medications:  .  0.9 %  sodium chloride infusion, 250 mL, Intravenous, Continuous, Quale, Mark, MD .  acetaminophen (TYLENOL) tablet 650 mg, 650 mg, Oral, Q6H PRN, Lorretta Harp, MD .  ondansetron (ZOFRAN) injection 4 mg, 4 mg, Intravenous, Q8H PRN, Lorretta Harp, MD  Current Outpatient Medications:  .  atorvastatin (LIPITOR) 40 MG tablet, Take 1 tablet (40 mg total) by mouth daily at 6 PM., Disp: , Rfl:  .  clopidogrel (PLAVIX) 75 MG tablet, Take 75 mg by mouth daily., Disp: , Rfl:  .  acetaminophen (TYLENOL) 325 MG tablet, Take 2 tablets (650 mg total) by mouth every 4 (four) hours as needed for mild pain (temp >  101.5)., Disp: , Rfl:  .  albuterol (PROVENTIL) (2.5 MG/3ML) 0.083% nebulizer solution, Take 2.5 mg by nebulization every 2 (two) hours as needed for wheezing or shortness of breath., Disp: , Rfl:  .  aspirin EC 81 MG tablet, Take 81 mg by mouth daily., Disp: , Rfl:  .  bisacodyl (BISACODYL LAXATIVE) 10 MG suppository, Place 10 mg rectally daily as needed for moderate constipation., Disp: , Rfl:  .   budesonide (PULMICORT) 0.5 MG/2ML nebulizer solution, Take 0.5 mg by nebulization 2 (two) times daily., Disp: , Rfl:  .  docusate sodium (COLACE) 100 MG capsule, Take 100 mg by mouth daily as needed for mild constipation., Disp: , Rfl:  .  doxycycline (VIBRA-TABS) 100 MG tablet, Take 1 tablet (100 mg total) by mouth every 12 (twelve) hours., Disp: 10 tablet, Rfl: 0 .  famotidine (PEPCID) 20 MG tablet, Take 20 mg by mouth 2 (two) times daily., Disp: , Rfl:  .  FLUoxetine (PROZAC) 20 MG capsule, Take 40 mg by mouth daily. , Disp: , Rfl:  .  fluticasone (FLONASE) 50 MCG/ACT nasal spray, Place 1 spray into both nostrils daily., Disp: , Rfl:  .  furosemide (LASIX) 40 MG tablet, Take 1 tablet (40 mg total) by mouth daily., Disp: 30 tablet, Rfl: 0 .  insulin detemir (LEVEMIR) 100 UNIT/ML injection, Inject 0.3 mLs (30 Units total) into the skin daily., Disp: 10 mL, Rfl: 11 .  ipratropium-albuterol (DUONEB) 0.5-2.5 (3) MG/3ML SOLN, Take 3 mLs by nebulization every 4 (four) hours as needed (wheezing or shortness of breath)., Disp: , Rfl:  .  losartan (COZAAR) 100 MG tablet, Take 100 mg by mouth daily., Disp: , Rfl:  .  losartan (COZAAR) 50 MG tablet, Take 100 mg by mouth daily., Disp: , Rfl:  .  metFORMIN (GLUCOPHAGE) 1000 MG tablet, Take 1 tablet (1,000 mg total) by mouth daily with breakfast., Disp: 30 tablet, Rfl: 11 .  metoprolol succinate (TOPROL-XL) 100 MG 24 hr tablet, Take 100 mg by mouth daily., Disp: , Rfl:  .  montelukast (SINGULAIR) 10 MG tablet, Take 10 mg by mouth at bedtime., Disp: , Rfl:  .  Multiple Vitamin (MULTIVITAMIN WITH MINERALS) TABS tablet, Take 1 tablet by mouth daily., Disp: , Rfl:  .  pantoprazole (PROTONIX) 40 MG tablet, Take 40 mg by mouth daily., Disp: , Rfl:  .  rivaroxaban (XARELTO) 20 MG TABS tablet, Take 20 mg by mouth daily with supper., Disp: , Rfl:  .  sennosides-docusate sodium (SENOKOT-S) 8.6-50 MG tablet, Take 1 tablet by mouth 2 (two) times daily as needed for  constipation., Disp: , Rfl:  .  spironolactone (ALDACTONE) 25 MG tablet, Take 1 tablet (25 mg total) by mouth daily., Disp: 30 tablet, Rfl: 0    ALLERGIES   Patient has no known allergies.    REVIEW OF SYSTEMS    10 point ROS conducted and is negative except as per subjective findings  PHYSICAL EXAMINATION   Vital Signs: Temp:  [102.5 F (39.2 C)-103.1 F (39.5 C)] 102.5 F (39.2 C) (06/04 1311) Pulse Rate:  [78-82] 78 (06/04 1323) Resp:  [22-26] 24 (06/04 1323) BP: (81-138)/(43-67) 138/56 (06/04 1323) SpO2:  [92 %-97 %] 97 % (06/04 1323) Weight:  [102.3 kg] 102.3 kg (06/04 1130)  GENERAL: Age-appropriate HEAD: Normocephalic, atraumatic.  EYES: Pupils equal, round, reactive to light.  No scleral icterus.  Periorbital and facial rash with mild vesicles with yellow fluid over the right face as well as extension and fluctuance down to  the submandibular neck area of the right face MOUTH: Moist mucosal membrane. NECK: Supple. No thyromegaly. No nodules. No JVD.  PULMONARY: Mild crackles at the bases CARDIOVASCULAR: S1 and S2. Regular rate and rhythm. No murmurs, rubs, or gallops.  GASTROINTESTINAL: Soft, nontender, non-distended. No masses. Positive bowel sounds. No hepatosplenomegaly.  MUSCULOSKELETAL: No swelling, clubbing, or edema.  NEUROLOGIC: Mild distress due to acute illness SKIN:intact,warm,dry   PERTINENT DATA     Infusions: . sodium chloride     Scheduled Medications:  PRN Medications: acetaminophen, ondansetron (ZOFRAN) IV Hemodynamic parameters:   Intake/Output: No intake/output data recorded.  Ventilator  Settings:     LAB RESULTS:  Basic Metabolic Panel: Recent Labs  Lab 10/04/19 1147  NA 135  K 4.2  CL 98  CO2 24  GLUCOSE 200*  BUN 22  CREATININE 0.81  CALCIUM 9.1   Liver Function Tests: Recent Labs  Lab 10/04/19 1147  AST 23  ALT 18  ALKPHOS 108  BILITOT 1.0  PROT 7.6  ALBUMIN 4.2   No results for input(s): LIPASE,  AMYLASE in the last 168 hours. No results for input(s): AMMONIA in the last 168 hours. CBC: Recent Labs  Lab 10/04/19 1147  WBC 18.6*  NEUTROABS 15.9*  HGB 11.3*  HCT 35.8*  MCV 82.9  PLT 222   Cardiac Enzymes: No results for input(s): CKTOTAL, CKMB, CKMBINDEX, TROPONINI in the last 168 hours. BNP: Invalid input(s): POCBNP CBG: No results for input(s): GLUCAP in the last 168 hours.     IMAGING RESULTS:  Imaging: DG Chest Portable 1 View  Result Date: 10/04/2019 CLINICAL DATA:  Fever. EXAM: PORTABLE CHEST 1 VIEW COMPARISON:  CT 07/21/2019. FINDINGS: Cardiac pacer with lead tip over the right atrium and right ventricle. Cardiomegaly. Aortic stent graft noted. Mild bilateral interstitial prominence. Mild CHF cannot be excluded. No pleural effusion or pneumothorax. IMPRESSION: 1. Cardiac pacer with lead tip over the right atrium and right ventricle. Cardiomegaly. Aortic stent graft noted. 2. Mild bilateral interstitial prominence. Mild CHF cannot be excluded. Electronically Signed   By: Maisie Fus  Register   On: 10/04/2019 12:35   @[email or web address] @ DG Chest Portable 1 View  Result Date: 10/04/2019 CLINICAL DATA:  Fever. EXAM: PORTABLE CHEST 1 VIEW COMPARISON:  CT 07/21/2019. FINDINGS: Cardiac pacer with lead tip over the right atrium and right ventricle. Cardiomegaly. Aortic stent graft noted. Mild bilateral interstitial prominence. Mild CHF cannot be excluded. No pleural effusion or pneumothorax. IMPRESSION: 1. Cardiac pacer with lead tip over the right atrium and right ventricle. Cardiomegaly. Aortic stent graft noted. 2. Mild bilateral interstitial prominence. Mild CHF cannot be excluded. Electronically Signed   By: 07/23/2019  Register   On: 10/04/2019 12:35     ASSESSMENT AND PLAN    -Multidisciplinary rounds held today  Transient hypotension -Possibly due to sepsis -Present on admission -Source may be facial erysipelas/Pedigo-cannot rule out venous sinus thrombosis, patient had Covid  previously -ID evaluation -s/p Rocephin x1 and vanco x1 - will change to doxy po bid until ID eval  -blood cultures in process -  Chronic systolic chf EF 20-25% -oxygen as needed -follow up cardiac biomarkers as indicated ICU monitoring  ID -continue abx as prescibed -follow up cultures  GI/Nutrition GI PROPHYLAXIS as indicated DIET-->TF's as tolerated Constipation protocol as indicated  ENDO - ICU hypoglycemic\Hyperglycemia protocol -check FSBS per protocol   ELECTROLYTES -follow labs as needed -replace as needed -pharmacy consultation   DVT/GI PRX ordered -SCDs  TRANSFUSIONS AS NEEDED MONITOR FSBS ASSESS the need for LABS  as needed   Critical care provider statement:    Critical care time (minutes):  33   Critical care time was exclusive of:  Separately billable procedures and treating other patients   Critical care was necessary to treat or prevent imminent or life-threatening deterioration of the following conditions:  transient hypotension, facial cellulitis, CHF, multiple comorbid conditions.    Critical care was time spent personally by me on the following activities:  Development of treatment plan with patient or surrogate, discussions with consultants, evaluation of patient's response to treatment, examination of patient, obtaining history from patient or surrogate, ordering and performing treatments and interventions, ordering and review of laboratory studies and re-evaluation of patient's condition.  I assumed direction of critical care for this patient from another provider in my specialty: no    This document was prepared using Dragon voice recognition software and may include unintentional dictation errors.    Vida Rigger, M.D.  Division of Pulmonary & Critical Care Medicine  [facility]

## 2019-10-04 NOTE — Progress Notes (Signed)
Pt arrived on unit at this time. Pt is alert and oriented X4, transferred self to ICU bed. VSS on 2L [place] (chronic). Pt was given tylenol in ED for temp of 102.5, temp is down to 101.2 now. Breathing normal. Red rash noted to right side of face.

## 2019-10-05 DIAGNOSIS — R519 Headache, unspecified: Secondary | ICD-10-CM

## 2019-10-05 DIAGNOSIS — R6520 Severe sepsis without septic shock: Secondary | ICD-10-CM

## 2019-10-05 LAB — GLUCOSE, CAPILLARY
Glucose-Capillary: 116 mg/dL — ABNORMAL HIGH (ref 70–99)
Glucose-Capillary: 128 mg/dL — ABNORMAL HIGH (ref 70–99)
Glucose-Capillary: 177 mg/dL — ABNORMAL HIGH (ref 70–99)
Glucose-Capillary: 181 mg/dL — ABNORMAL HIGH (ref 70–99)
Glucose-Capillary: 236 mg/dL — ABNORMAL HIGH (ref 70–99)
Glucose-Capillary: 238 mg/dL — ABNORMAL HIGH (ref 70–99)

## 2019-10-05 LAB — BASIC METABOLIC PANEL
Anion gap: 10 (ref 5–15)
BUN: 16 mg/dL (ref 8–23)
CO2: 24 mmol/L (ref 22–32)
Calcium: 8.3 mg/dL — ABNORMAL LOW (ref 8.9–10.3)
Chloride: 104 mmol/L (ref 98–111)
Creatinine, Ser: 0.7 mg/dL (ref 0.44–1.00)
GFR calc Af Amer: >60 mL/min (ref >60)
GFR calc non Af Amer: >60 mL/min (ref >60)
Glucose, Bld: 133 mg/dL — ABNORMAL HIGH (ref 70–99)
Potassium: 3.7 mmol/L (ref 3.5–5.1)
Sodium: 138 mmol/L (ref 135–145)

## 2019-10-05 LAB — CBC
HCT: 30.3 % — ABNORMAL LOW (ref 36.0–46.0)
Hemoglobin: 9.6 g/dL — ABNORMAL LOW (ref 12.0–15.0)
MCH: 26.1 pg (ref 26.0–34.0)
MCHC: 31.7 g/dL (ref 30.0–36.0)
MCV: 82.3 fL (ref 80.0–100.0)
Platelets: 186 10*3/uL (ref 150–400)
RBC: 3.68 MIL/uL — ABNORMAL LOW (ref 3.87–5.11)
RDW: 16.3 % — ABNORMAL HIGH (ref 11.5–15.5)
WBC: 14.3 10*3/uL — ABNORMAL HIGH (ref 4.0–10.5)
nRBC: 0 % (ref 0.0–0.2)

## 2019-10-05 NOTE — Progress Notes (Signed)
Report given to RN [id number]. Patient with no complaints at this time. Will transfer via WC.

## 2019-10-05 NOTE — Progress Notes (Signed)
CRITICAL CARE PROGRESS NOTE    Name: Desiree Mccall MRN: [id number] DOB: 09/28/1950     LOS: 1   SUBJECTIVE FINDINGS & SIGNIFICANT EVENTS   Patient description:  This is a pleasant 69 year old female with a history of aortic dissection many years ago, CAD, ischemic cardiomyopathy, cardiac arrest, paroxysmal A. fib, essential hypertension, OSA on CPAP, history of previous pancreatitis, with advanced systolic CHF with EF of 20 to 40%25%.  Patient came in due to worsening pressure around the face which initially started approximately 1 month ago with nasal bridge irritation that extended bilateral periorbital area as well as face worse on the right and progressively indurated as well as small pustular vesicles appearing over the right face.  There is a right facial open skin wound which patient states is chronic.  Additionally right neck with fluctuant area over the proximal caudal sternocleidomastoid neck.  She reports chills and diaphoresis possible subjective fevers unmeasured.  She came into ER due to worsening symptoms of facial rash over the last 24 hours.  Critical care consultation placed for initial hypotension which was transient and apparently due to history of surgical manipulation of the upper extremity, patient states she is known to have different blood pressures on each arm.  Lines / Drains: PIV x2  Cultures / Sepsis markers: Blood cultures x2  Antibiotics: Doxycycline 100 twice daily   Protocols / Consultants: Hospitalist service, ID, PCCM  10/05/19- patient clinically unchanged from yesterday, s/p ID eval, s/p CT maxilofacial.    PAST MEDICAL HISTORY   Past Medical History:  Diagnosis Date  . Aortic dissection (HCC) 2010  . CHF (congestive heart failure) (HCC)   . Diabetes mellitus without  complication (HCC)   . [name]   . Heart attack (HCC) sept. 25, 2010     SURGICAL HISTORY   Past Surgical History:  Procedure Laterality Date  . ABDOMINAL HYSTERECTOMY    . CARDIAC SURGERY    . CAROTID STENT  2010  . CHOLECYSTECTOMY    . LEFT HEART CATH AND CORONARY ANGIOGRAPHY N/A 06/05/2018   Procedure: LEFT HEART CATH AND CORONARY ANGIOGRAPHY;  Surgeon: Laurier NancyKhan, Shaukat A, MD;  Location: [facility] INV[facility]IVE [facility];  Service: Cardiovascular;  Laterality: N/A;  . STENT PLACE LEFT URETER ([facility] HX)       FAMILY HISTORY   Family History  Problem Relation Age of Onset  . Heart failure Mother   . Heart failure Father      SOCIAL HISTORY   Social History   Tobacco Use  . Smoking status: Former Smoker    Packs/day: 0.50    Types: Cigarettes    Start date: 2012  . Smokeless tobacco: Never Used  Substance Use Topics  . Alcohol use: Yes    Comment: occasionally  . Drug use: No     MEDICATIONS   Current Medication:  Current Facility-Administered Medications:  .  acetaminophen (TYLENOL) tablet 650 mg, 650 mg, Oral, Q6H PRN, Lorretta HarpNiu, Xilin, MD, 650 mg at 10/04/19 1732 .  albuterol (PROVENTIL) (2.5 MG/3ML) 0.083% nebulizer solution 3 mL, 3 mL, Inhalation, Q4H PRN, Lorretta HarpNiu, Xilin, MD .  atorvastatin (LIPITOR) tablet 40 mg, 40 mg, Oral, q1800, Lorretta HarpNiu, Xilin, MD, 40 mg at 10/04/19 1833 .  ceFAZolin (ANCEF) IVPB 2g/100 mL premix, 2 g, Intravenous, Q8H, Lynn Itoavishankar, Jayashree, MD, Stopped at 10/05/19 0533 .  Chlorhexidine Gluconate Cloth 2 % PADS 6 each, 6 each, Topical, Daily, Vida RiggerAleskerov, Venisa Frampton, MD, 6 each at 10/05/19 1105 .  clindamycin (CLEOCIN) IVPB  600 mg, 600 mg, Intravenous, Q8H, Lynn Ito, MD, Stopped at 10/05/19 0606 .  clopidogrel (PLAVIX) tablet 75 mg, 75 mg, Oral, Daily, Lorretta Harp, MD, 75 mg at 10/05/19 0928 .  FLUoxetine (PROZAC) capsule 80 mg, 80 mg, Oral, Daily, Lorretta Harp, MD, 80 mg at 10/05/19 1660 .  fluticasone (FLONASE) 50 MCG/ACT nasal spray 1 spray, 1  spray, Each Nare, Daily, Lorretta Harp, MD .  insulin aspart (novoLOG) injection 0-9 Units, 0-9 Units, Subcutaneous, Q4H, Lorretta Harp, MD, 1 Units at 10/05/19 0749 .  insulin detemir (LEVEMIR) injection 20 Units, 20 Units, Subcutaneous, Daily, Lorretta Harp, MD, 20 Units at 10/05/19 [phone] .  loratadine (CLARITIN) tablet 10 mg, 10 mg, Oral, Daily, Lorretta Harp, MD, 10 mg at 10/05/19 6010 .  MEDLINE mouth rinse, 15 mL, Mouth Rinse, BID, Blakeney, Dana G, NP, 15 mL at 10/05/19 1106 .  multivitamin with minerals tablet 1 tablet, 1 tablet, Oral, Daily, Lorretta Harp, MD, 1 tablet at 10/05/19 [phone] .  ondansetron (ZOFRAN) injection 4 mg, 4 mg, Intravenous, Q8H PRN, Lorretta Harp, MD .  rivaroxaban (XARELTO) tablet 20 mg, 20 mg, Oral, Q supper, Lorretta Harp, MD, 20 mg at 10/04/19 1951    ALLERGIES   Patient has no known allergies.    REVIEW OF SYSTEMS    10 point ROS conducted and is negative except as per subjective findings  PHYSICAL EXAMINATION   Vital Signs: Temp:  [98.3 F (36.8 C)-102.5 F (39.2 C)] 98.3 F (36.8 C) (06/05 0700) Pulse Rate:  [61-82] 68 (06/05 0900) Resp:  [17-37] 21 (06/05 0900) BP: (81-150)/(32-109) 116/73 (06/05 0900) SpO2:  [91 %-100 %] 97 % (06/05 0900) Weight:  [99.7 kg] 99.7 kg (06/05 0144)  GENERAL: Age-appropriate HEAD: Normocephalic, atraumatic.  EYES: Pupils equal, round, reactive to light.  No scleral icterus.  Periorbital and facial rash with mild vesicles with yellow fluid over the right face as well as extension and fluctuance down to the submandibular neck area of the right face MOUTH: Moist mucosal membrane. NECK: Supple. No thyromegaly. No nodules. No JVD.  PULMONARY: Mild crackles at the bases CARDIOVASCULAR: S1 and S2. Regular rate and rhythm. No murmurs, rubs, or gallops.  GASTROINTESTINAL: Soft, nontender, non-distended. No masses. Positive bowel sounds. No hepatosplenomegaly.  MUSCULOSKELETAL: No swelling, clubbing, or edema.  NEUROLOGIC: Mild distress  due to acute illness SKIN:intact,warm,dry   PERTINENT DATA     Infusions: .  ceFAZolin (ANCEF) IV Stopped (10/05/19 0533)  . clindamycin (CLEOCIN) IV Stopped (10/05/19 0606)   Scheduled Medications: . atorvastatin  40 mg Oral q1800  . Chlorhexidine Gluconate Cloth  6 each Topical Daily  . clopidogrel  75 mg Oral Daily  . FLUoxetine  80 mg Oral Daily  . fluticasone  1 spray Each Nare Daily  . insulin aspart  0-9 Units Subcutaneous Q4H  . insulin detemir  20 Units Subcutaneous Daily  . loratadine  10 mg Oral Daily  . mouth rinse  15 mL Mouth Rinse BID  . multivitamin with minerals  1 tablet Oral Daily  . rivaroxaban  20 mg Oral Q supper   PRN Medications: acetaminophen, albuterol, ondansetron (ZOFRAN) IV Hemodynamic parameters:   Intake/Output: 06/04 0701 - 06/05 0700 In: 1200 [IV Piggyback:1200] Out: 900 [Urine:900]  Ventilator  Settings:     LAB RESULTS:  Basic Metabolic Panel: Recent Labs  Lab 10/04/19 1147 10/05/19 0654  NA 135 138  K 4.2 3.7  CL 98 104  CO2 24 24  GLUCOSE 200* 133*  BUN 22 16  CREATININE 0.81 0.70  CALCIUM 9.1 8.3*   Liver Function Tests: Recent Labs  Lab 10/04/19 1147  AST 23  ALT 18  ALKPHOS 108  BILITOT 1.0  PROT 7.6  ALBUMIN 4.2   No results for input(s): LIPASE, AMYLASE in the last 168 hours. No results for input(s): AMMONIA in the last 168 hours. CBC: Recent Labs  Lab 10/04/19 1147 10/05/19 0654  WBC 18.6* 14.3*  NEUTROABS 15.9*  --   HGB 11.3* 9.6*  HCT 35.8* 30.3*  MCV 82.9 82.3  PLT 222 186   Cardiac Enzymes: No results for input(s): CKTOTAL, CKMB, CKMBINDEX, TROPONINI in the last 168 hours. BNP: Invalid input(s): POCBNP CBG: Recent Labs  Lab 10/04/19 1950 10/04/19 2325 10/05/19 0336 10/05/19 0739 10/05/19 1143  GLUCAP 253* 191* 116* 128* 238*       IMAGING RESULTS:  Imaging: CT SOFT TISSUE NECK W CONTRAST  Result Date: 10/04/2019 CLINICAL DATA:  Cellulitis, neck. EXAM: CT NECK WITH  CONTRAST TECHNIQUE: Multidetector CT imaging of the neck was performed using the standard protocol following the bolus administration of intravenous contrast. CONTRAST:  57mL OMNIPAQUE IOHEXOL 300 MG/ML  SOLN COMPARISON:  CT angiogram chest/abdomen/pelvis 07/21/2018, head CT 08/03/2016. FINDINGS: Pharynx and larynx: Streak artifact from dental restoration limits evaluation the oral cavity. Within this limitation, no swelling or discrete mass is appreciated within the oral cavity, pharynx or larynx. There is poor dentition with multiple absent and carious teeth. Salivary glands: Inflammatory stranding surrounds the right parotid gland. The parotid and submandibular glands are otherwise unremarkable. Thyroid: Subcentimeter thyroid nodule within the isthmus not meeting consensus criteria for ultrasound follow-up. Lymph nodes: There is bilateral cervical chain lymphadenopathy, most notably right level I and bilateral level II lymph nodes. Level II lymph nodes measure up to 14 mm in short axis. Vascular: The major vascular structures of the neck are patent. Limited intracranial: No abnormality identified. Visualized orbits: Visualized orbits show no acute finding. Mastoids and visualized paranasal sinuses: Partial opacification of the left sphenoid sinus. No significant mastoid effusion. Skeleton: No acute bony abnormality or aggressive osseous lesion. Upper chest: No consolidation within the imaged lung apices. 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 Other: There is inflammatory stranding within the bilateral periorbital region and along the bridge of the nose extending to the right greater than left maxillofacial region and caudally within the right greater than left neck. No organized soft tissue abscess is identified. IMPRESSION: Inflammatory stranding within the bilateral periorbital region and along the bridge of the nose extending to the right greater than left  maxillofacial soft tissues and caudally within the right greater than left neck as detailed. Findings are nonspecific but suggestive of cellulitis. No organized soft tissue abscess is identified. Cervical lymphadenopathy as described which may be reactive. Close clinical follow-up is recommended with imaging follow-up as clinically warranted to exclude alternative etiologies. Poor dentition with multiple absent and carious teeth. Left sphenoid sinusitis. Redemonstrated sequela of prior thoracic aortic aneurysm endovascular repair, partially visualized. Electronically Signed   By: Jackey Loge DO   On: 10/04/2019 17:39   DG Chest Portable 1 View  Result Date: 10/04/2019 CLINICAL DATA:  Fever. EXAM: PORTABLE CHEST 1 VIEW COMPARISON:  CT 07/21/2019. FINDINGS: Cardiac pacer with lead tip over the right atrium and right ventricle. Cardiomegaly. Aortic stent graft noted. Mild bilateral interstitial prominence. Mild CHF cannot be excluded. No pleural effusion or pneumothorax. IMPRESSION: 1. Cardiac pacer with lead tip over the right atrium and right ventricle. Cardiomegaly.  Aortic stent graft noted. 2. Mild bilateral interstitial prominence. Mild CHF cannot be excluded. Electronically Signed   By: Maisie Fus  Register   On: 10/04/2019 12:35   @PROBHOSP @ CT SOFT TISSUE NECK W CONTRAST  Result Date: 10/04/2019 CLINICAL DATA:  Cellulitis, neck. EXAM: CT NECK WITH CONTRAST TECHNIQUE: Multidetector CT imaging of the neck was performed using the standard protocol following the bolus administration of intravenous contrast. CONTRAST:  45mL OMNIPAQUE IOHEXOL 300 MG/ML  SOLN COMPARISON:  CT angiogram chest/abdomen/pelvis 07/21/2018, head CT 08/03/2016. FINDINGS: Pharynx and larynx: Streak artifact from dental restoration limits evaluation the oral cavity. Within this limitation, no swelling or discrete mass is appreciated within the oral cavity, pharynx or larynx. There is poor dentition with multiple absent and carious teeth.  Salivary glands: Inflammatory stranding surrounds the right parotid gland. The parotid and submandibular glands are otherwise unremarkable. Thyroid: Subcentimeter thyroid nodule within the isthmus not meeting consensus criteria for ultrasound follow-up. Lymph nodes: There is bilateral cervical chain lymphadenopathy, most notably right level I and bilateral level II lymph nodes. Level II lymph nodes measure up to 14 mm in short axis. Vascular: The major vascular structures of the neck are patent. Limited intracranial: No abnormality identified. Visualized orbits: Visualized orbits show no acute finding. Mastoids and visualized paranasal sinuses: Partial opacification of the left sphenoid sinus. No significant mastoid effusion. Skeleton: No acute bony abnormality or aggressive osseous lesion. Upper chest: No consolidation within the imaged lung apices. 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 Other: There is inflammatory stranding within the bilateral periorbital region and along the bridge of the nose extending to the right greater than left maxillofacial region and caudally within the right greater than left neck. No organized soft tissue abscess is identified. IMPRESSION: Inflammatory stranding within the bilateral periorbital region and along the bridge of the nose extending to the right greater than left maxillofacial soft tissues and caudally within the right greater than left neck as detailed. Findings are nonspecific but suggestive of cellulitis. No organized soft tissue abscess is identified. Cervical lymphadenopathy as described which may be reactive. Close clinical follow-up is recommended with imaging follow-up as clinically warranted to exclude alternative etiologies. Poor dentition with multiple absent and carious teeth. Left sphenoid sinusitis. Redemonstrated sequela of prior thoracic aortic aneurysm endovascular repair, partially visualized. Electronically  Signed   By: 10/03/2016 DO   On: 10/04/2019 17:39   DG Chest Portable 1 View  Result Date: 10/04/2019 CLINICAL DATA:  Fever. EXAM: PORTABLE CHEST 1 VIEW COMPARISON:  CT 07/21/2019. FINDINGS: Cardiac pacer with lead tip over the right atrium and right ventricle. Cardiomegaly. Aortic stent graft noted. Mild bilateral interstitial prominence. Mild CHF cannot be excluded. No pleural effusion or pneumothorax. IMPRESSION: 1. Cardiac pacer with lead tip over the right atrium and right ventricle. Cardiomegaly. Aortic stent graft noted. 2. Mild bilateral interstitial prominence. Mild CHF cannot be excluded. Electronically Signed   By: 07/23/2019  Register   On: 10/04/2019 12:35     ASSESSMENT AND PLAN    -Multidisciplinary rounds held today  Transient hypotension -Possibly due to sepsis -Present on admission -Source may be facial erysipelas/Pedigo-cannot rule out venous sinus thrombosis, patient had Covid previously -ID evaluation -s/p Rocephin x1 and vanco x1 - will change to doxy po bid until ID eval  -blood cultures in process   Chronic systolic chf EF 20-25% -oxygen as needed -follow up cardiac biomarkers as indicated ICU monitoring  ID -continue abx as prescibed -follow up cultures  GI/Nutrition  GI PROPHYLAXIS as indicated DIET-->TF's as tolerated Constipation protocol as indicated  ENDO - ICU hypoglycemic\Hyperglycemia protocol -check FSBS per protocol   ELECTROLYTES -follow labs as needed -replace as needed -pharmacy consultation   DVT/GI PRX ordered -SCDs  TRANSFUSIONS AS NEEDED MONITOR FSBS ASSESS the need for LABS as needed   Critical care provider statement:    Critical care time (minutes):  33   Critical care time was exclusive of:  Separately billable procedures and treating other patients   Critical care was necessary to treat or prevent imminent or life-threatening deterioration of the following conditions:  transient hypotension, facial cellulitis, CHF,  multiple comorbid conditions.    Critical care was time spent personally by me on the following activities:  Development of treatment plan with patient or surrogate, discussions with consultants, evaluation of patient's response to treatment, examination of patient, obtaining history from patient or surrogate, ordering and performing treatments and interventions, ordering and review of laboratory studies and re-evaluation of patient's condition.  I assumed direction of critical care for this patient from another provider in my specialty: no    This document was prepared using Dragon voice recognition software and may include unintentional dictation errors.    Ottie Glazier, M.D.  Division of [place]

## 2019-10-05 NOTE — Progress Notes (Signed)
PROGRESS NOTE    Desiree Mccall  LDJ:[id number] DOB: [birth year] DOA: 10/04/2019 PCP: Frazier Richards, MD   Chief complaint.  Right facial swelling.   Brief Narrative:  Desiree Mccall is a 69 y.o. 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  Patient was diagnosed with right facial cellulitis, she is seen by infectious disease.  Was placed on clindamycin and cefazolin.  Patient developed hypotension during the night, she was briefly placed on pressor which has resolved the next morning.   Assessment & Plan:   Principal Problem:   Facial pain Active Problems:   Aortic dissection (HCC)   Automatic implantable cardioverter-defibrillator in situ   CAD (coronary artery disease)   Dyslipidemia   H/O: depression   History of stroke   Chronic systolic CHF (congestive heart failure) ([place])   Sepsis ([place])   Facial cellulitis   Diabetes mellitus without complication ([place])  #1.  Severe sepsis secondary to right facial cellulitis.  POA. Patient had a significant hypotension in association with sepsis.  Condition improved overnight.  Soft tissue CT scan of the neck was obtained, did not show any abscess. Appreciate infect disease consult.  Continue antibiotics with cefazolin and clindamycin.  Patient condition is improving.  Will transfer patient to medical floor.  2.  Chronic systolic congestive heart failure with ejection fraction 20 to 25%. Patient has a murmur in the right upper sternal border, most recent echocardiogram showed aortic sclerosis without stenosis.  Currently, patient does not have exacerbation of congestive heart failure.  3.  Type 2 diabetes. Continue Lantus and sliding scale insulin.  4.  History of  stroke. Continue home medicines.  5.  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   DVT prophylaxis: Xarelto. Code Status: Full Family Communication: None Disposition Plan:  . Patient came from: Home            . Anticipated d/c place: Home . Barriers to d/c OR conditions which need to be met to effect a safe d/c:   Consultants:   Critical care  Infectious disease  Procedures: None Antimicrobials: Clindamycin and cefazolin. Subjective: Patient feels much better today.  Right facial swelling greatly improved today.  Pain is better.  No additional fever or chills, no nausea vomiting. Denies any short of breath or cough, no abdominal pain or nausea vomiting or constipation. Denies dysuria hematuria. No confusion or agitation.  Objective: Vitals:   10/05/19 0500 10/05/19 0600 10/05/19 0700 10/05/19 0900  BP: (!) 112/47 96/70 130/64 116/73  Pulse: 64 61 63 68  Resp: (!) 21 20 19  (!) 21  Temp:   98.3 F (36.8 C)   TempSrc:   Oral   SpO2: 99% 98% 98% 97%  Weight:      Height:        Intake/Output Summary (Last 24 hours) at 10/05/2019 1250 Last data filed at 10/05/2019 0700 Gross per 24 hour  Intake 1200 ml  Output 900 ml  Net 300 ml   Filed Weights   10/04/19 1130 10/04/19 1811 10/05/19 0144  Weight: 102.3 kg 99.7 kg 99.7 kg    Examination:  General exam: Appears calm and comfortable  Respiratory system: Clear to auscultation. Respiratory effort normal. Cardiovascular system: S1 & S2 heard, RRR. No JVD, murmurs, rubs, gallops or clicks. No pedal edema.  Gastrointestinal system: Abdomen is nondistended, soft and nontender. No organomegaly or masses felt. Normal bowel sounds heard. Central nervous system: Alert and oriented. No focal neurological deficits. Extremities: Symmetric 5 x 5 power. Skin: No rashes, lesions or ulcers Psychiatry: Judgement and insight appear normal. Mood & affect appropriate.   Right facial redness much improved.  Right vision normal.   Data  Reviewed: I have personally reviewed following labs and imaging studies  CBC: Recent Labs  Lab 10/04/19 1147 10/05/19 0654  WBC 18.6* 14.3*  NEUTROABS 15.9*  --   HGB 11.3* 9.6*  HCT 35.8* 30.3*  MCV 82.9 82.3  PLT 222 443   Basic Metabolic Panel: Recent Labs  Lab 10/04/19 1147 10/05/19 0654  NA 135 138  K 4.2 3.7  CL 98 104  CO2 24 24  GLUCOSE 200* 133*  BUN 22 16  CREATININE 0.81 0.70  CALCIUM 9.1 8.3*   GFR: Estimated Creatinine Clearance: 83.9 mL/min (by C-G formula based on SCr of 0.7 mg/dL). Liver Function Tests: Recent Labs  Lab 10/04/19 1147  AST 23  ALT 18  ALKPHOS 108  BILITOT 1.0  PROT 7.6  ALBUMIN 4.2   No results for input(s): LIPASE, AMYLASE in the last 168 hours. No results for input(s): AMMONIA in the last 168 hours. Coagulation Profile: No results for input(s): [name], PROTIME in the last 168 hours. Cardiac Enzymes: No results for input(s): CKTOTAL, CKMB, CKMBINDEX, TROPONINI in the last 168 hours. BNP (last 3 results) No results for input(s): PROBNP in the last 8760 hours. HbA1C: No results for input(s): HGBA1C in the last 72 hours. CBG: Recent Labs  Lab 10/04/19 1950 10/04/19 2325 10/05/19 0336 10/05/19 0739 10/05/19 1143  GLUCAP 253* 191* 116* 128* 238*   Lipid Profile: No results for input(s): CHOL, HDL, LDLCALC, TRIG, CHOLHDL, LDLDIRECT in the last 72 hours. Thyroid Function Tests: No results for input(s): TSH, T4TOTAL, FREET4, T3FREE, THYROIDAB in the last 72 hours. Anemia Panel: No results for input(s): VITAMINB12, FOLATE, FERRITIN, TIBC, IRON, RETICCTPCT in the last 72 hours. Sepsis Labs: Recent Labs  Lab 10/04/19 1147 10/04/19 1533 10/04/19 1833 10/04/19 2134  PROCALCITON  --   --  0.23  --   LATICACIDVEN 2.0* 0.8 1.2 1.0    Recent Results (from the past 240 hour(s))  Culture, blood (Routine x 2)     Status: None (Preliminary result)   Collection Time: 10/04/19 11:47 AM   Specimen: BLOOD  Result Value Ref Range  Status   Specimen Description BLOOD BLOOD RIGHT ARM  Final   Special Requests   Final    BOTTLES DRAWN AEROBIC AND ANAEROBIC Blood Culture results may not be optimal due to an inadequate volume of blood received in culture bottles   Culture   Final    NO GROWTH < 24 HOURS Performed at [facility], [address], [place], [place] [address]    Report Status PENDING  Incomplete  Culture, blood (Routine x 2)     Status: None (Preliminary result)   Collection Time: 10/04/19 11:47 AM   Specimen: BLOOD  Result Value Ref Range Status   Specimen Description BLOOD BLOOD RIGHT ARM  Final   Special Requests   Final    BOTTLES DRAWN AEROBIC AND ANAEROBIC Blood Culture results may not be optimal due to an inadequate volume of blood received in culture bottles   Culture   Final    NO GROWTH < 24 HOURS Performed at [facility], [address] [place], [place]  [address]    Report Status PENDING  Incomplete  SARS Coronavirus 2 by RT PCR (hospital order, performed in [facility] hospital lab) Nasopharyngeal Nasopharyngeal Swab     Status: None   Collection Time: 10/04/19  1:25 PM   Specimen: Nasopharyngeal Swab  Result Value Ref Range Status   SARS Coronavirus 2 NEGATIVE NEGATIVE Final    Comment: (NOTE) SARS-CoV-2 target nucleic acids are NOT DETECTED. The SARS-CoV-2 RNA is generally detectable in upper and lower respiratory specimens during the acute phase of infection. The lowest concentration of SARS-CoV-2 viral copies this assay can detect is 250 copies / mL. A negative result does not preclude SARS-CoV-2 infection and should not be used as the sole basis for treatment or other patient management decisions.  A negative result may occur with improper specimen collection / handling, submission of specimen other than nasopharyngeal swab, presence of viral mutation(s) within the areas targeted by this assay, and inadequate number of viral copies (<250 copies / mL). A  negative result must be combined with clinical observations, patient history, and epidemiological information. Fact Sheet for Patients:   StrictlyIdeas.no Fact Sheet for Healthcare Providers: BankingDealers.co.za This test is not yet approved or cleared  by the [place] FDA and has been authorized for detection and/or diagnosis of SARS-CoV-2 by FDA under an Emergency Use Authorization (EUA).  This EUA will remain in effect (meaning this test can be used) for the duration of the COVID-19 declaration under Section 564(b)(1) of the Act, 21 U.S.C. section 360bbb-3(b)(1), unless the authorization is terminated or revoked sooner. Performed at [facility], [address] [place], [place] [address]   MRSA PCR Screening     Status: None   Collection Time: 10/04/19  5:38 PM   Specimen: Nasal Mucosa; Nasopharyngeal  Result Value Ref Range Status   MRSA by PCR NEGATIVE NEGATIVE Final    Comment:        The GeneXpert MRSA Assay (FDA approved for NASAL specimens only), is one component of a comprehensive MRSA colonization surveillance program. It is not intended to diagnose MRSA infection nor to guide or monitor treatment for MRSA infections. Performed at [facility], [address] [place], [place] [address]   Aerobic Culture (superficial specimen)     Status: None (Preliminary result)   Collection Time: 10/04/19  8:08 PM   Specimen: Face; Wound  Result Value Ref Range Status   Specimen Description   Final    FACE LEFT CHECK Performed at [facility] [address] [place], [place] [address]    Special Requests   Final    NONE Performed at [facility], [address] [place], [place] [address]    Gram Stain   Final    RARE WBC PRESENT, PREDOMINANTLY PMN NO ORGANISMS SEEN    Culture   Final    NO GROWTH < 12 HOURS Performed at [facility] [address] [place], [place]  [address]    Report Status PENDING  Incomplete  Aerobic Culture (superficial specimen)     Status: None (Preliminary result)   Collection Time: 10/04/19  8:08 PM   Specimen: Eye; Wound  Result Value Ref Range Status   Specimen Description   Final    EYE LEFT Performed at [facility], [place] [address] [place], [place] [address]    Special Requests   Final    NONE Performed at [facility], [address] [place], [place] [address]  Gram Stain   Final    RARE WBC PRESENT, PREDOMINANTLY PMN RARE GRAM POSITIVE COCCI    Culture   Final    TOO YOUNG TO READ Performed at [facility], [place] [address], [place], [place] [address]    Report Status PENDING  Incomplete         Radiology Studies: CT SOFT TISSUE NECK W CONTRAST  Result Date: 10/04/2019 CLINICAL DATA:  Cellulitis, neck. EXAM: CT NECK WITH CONTRAST TECHNIQUE: Multidetector CT imaging of the neck was performed using the standard protocol following the bolus administration of intravenous contrast. CONTRAST:  86m OMNIPAQUE IOHEXOL 300 MG/ML  SOLN COMPARISON:  CT angiogram chest/abdomen/pelvis 07/21/2018, head CT 08/03/2016. FINDINGS: Pharynx and larynx: Streak artifact from dental restoration limits evaluation the oral cavity. Within this limitation, no swelling or discrete mass is appreciated within the oral cavity, pharynx or larynx. There is poor dentition with multiple absent and carious teeth. Salivary glands: Inflammatory stranding surrounds the right parotid gland. The parotid and submandibular glands are otherwise unremarkable. Thyroid: Subcentimeter thyroid nodule within the isthmus not meeting consensus criteria for ultrasound follow-up. Lymph nodes: There is bilateral cervical chain lymphadenopathy, most notably right level I and bilateral level II lymph nodes. Level II lymph nodes measure up to 14 mm in short axis. Vascular: The major vascular structures of the neck are patent. Limited intracranial: No  abnormality identified. Visualized orbits: Visualized orbits show no acute finding. Mastoids and visualized paranasal sinuses: Partial opacification of the left sphenoid sinus. No significant mastoid effusion. Skeleton: No acute bony abnormality or aggressive osseous lesion. Upper chest: No consolidation within the imaged lung apices. 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 Other: There is inflammatory stranding within the bilateral periorbital region and along the bridge of the nose extending to the right greater than left maxillofacial region and caudally within the right greater than left neck. No organized soft tissue abscess is identified. IMPRESSION: Inflammatory stranding within the bilateral periorbital region and along the bridge of the nose extending to the right greater than left maxillofacial soft tissues and caudally within the right greater than left neck as detailed. Findings are nonspecific but suggestive of cellulitis. No organized soft tissue abscess is identified. Cervical lymphadenopathy as described which may be reactive. Close clinical follow-up is recommended with imaging follow-up as clinically warranted to exclude alternative etiologies. Poor dentition with multiple absent and carious teeth. Left sphenoid sinusitis. Redemonstrated sequela of prior thoracic aortic aneurysm endovascular repair, partially visualized. Electronically Signed   By: KKellie SimmeringDO   On: 10/04/2019 17:39   DG Chest Portable 1 View  Result Date: 10/04/2019 CLINICAL DATA:  Fever. EXAM: PORTABLE CHEST 1 VIEW COMPARISON:  CT 07/21/2019. FINDINGS: Cardiac pacer with lead tip over the right atrium and right ventricle. Cardiomegaly. Aortic stent graft noted. Mild bilateral interstitial prominence. Mild CHF cannot be excluded. No pleural effusion or pneumothorax. IMPRESSION: 1. Cardiac pacer with lead tip over the right atrium and right ventricle. Cardiomegaly. Aortic stent  graft noted. 2. Mild bilateral interstitial prominence. Mild CHF cannot be excluded. Electronically Signed   By: TMarcello Moores Register   On: 10/04/2019 12:35        Scheduled Meds: . atorvastatin  40 mg Oral q1800  . Chlorhexidine Gluconate Cloth  6 each Topical Daily  . clopidogrel  75 mg Oral Daily  . FLUoxetine  80 mg Oral Daily  . fluticasone  1 spray Each Nare Daily  . insulin aspart  0-9 Units  Subcutaneous Q4H  . insulin detemir  20 Units Subcutaneous Daily  . loratadine  10 mg Oral Daily  . mouth rinse  15 mL Mouth Rinse BID  . multivitamin with minerals  1 tablet Oral Daily  . rivaroxaban  20 mg Oral Q supper   Continuous Infusions: .  ceFAZolin (ANCEF) IV Stopped (10/05/19 0533)  . clindamycin (CLEOCIN) IV 600 mg (10/05/19 1249)     LOS: 1 day    Time spent: 35 minutes    Sharen Hones, MD [facility]   To contact the attending provider between 7A-7P or the covering provider during after hours 7P-7A, please log into the web site [email or web address] and access using universal [place] password for that web site. If you do not have the password, please call the hospital operator.  10/05/2019, 12:50 PM

## 2019-10-06 LAB — GLUCOSE, CAPILLARY
Glucose-Capillary: 123 mg/dL — ABNORMAL HIGH (ref 70–99)
Glucose-Capillary: 122 mg/dL — ABNORMAL HIGH (ref 70–99)
Glucose-Capillary: 296 mg/dL — ABNORMAL HIGH (ref 70–99)

## 2019-10-06 MED ORDER — CEPHALEXIN 500 MG PO CAPS: 500.0000 mg | ORAL_CAPSULE | Freq: Three times a day (TID) | ORAL | 0 refills | Status: AC

## 2019-10-06 MED ORDER — CLINDAMYCIN HCL 300 MG PO CAPS
300.0000 mg | ORAL_CAPSULE | Freq: Three times a day (TID) | ORAL | 0 refills | Status: AC
Start: 2019-10-06 — End: 2019-10-11

## 2019-10-06 NOTE — Discharge Instructions (Signed)
1.  Complete antibiotics.  2.  Follow-up with PCP in 1 week.  3.  If diarrhea, need to talk to the family doctor to check for C. difficile.

## 2019-10-06 NOTE — Discharge Summary (Signed)
Physician Discharge Summary  Patient ID: Desiree Mccall MRN: [id number] DOB/AGE: [birth year] 69 y.o.  Admit date: 10/04/2019 Discharge date: 10/06/2019  Admission Diagnoses:  Discharge Diagnoses:  Principal Problem:   Facial pain Active Problems:   Aortic dissection (HCC)   Automatic implantable cardioverter-defibrillator in situ   CAD (coronary artery disease)   Dyslipidemia   H/O: depression   History of stroke   Chronic systolic CHF (congestive heart failure) (HCC)   Sepsis (HCC)   Facial cellulitis   Diabetes mellitus without complication [facility])   Discharged Condition: good  Hospital Course:  Desiree Mccall a 69 y.o.femalewith medical history significant ofhyperlipidemia, diabetes mellitus, stroke, depression, CAD, myocardial infarction,sCHF with EF 20-25%, aortic dissection, AICD, COVID-19 infection, PAD, OSA on CPAP, former smoker, pancreatitis,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  Patient was diagnosed with right facial cellulitis, she is seen by infectious disease.  Was placed on clindamycin and cefazolin.  Patient developed hypotension during the night, she was briefly placed on pressor which has resolved the next morning.  #1.  Severe sepsis secondary to right facial cellulitis.  POA. Patient had a significant hypotension in association with sepsis.  Condition improved overnight.  Soft tissue CT scan of the neck  did not show any abscess. Patient is treated with cefazolin and clindamycin.    Condition much improved.  At this point, we will continue 5 days treatment with Keflex and oral clindamycin.  2.  Chronic systolic congestive heart failure with ejection fraction 20 to 25%. Patient has a murmur in the right upper sternal border, most recent echocardiogram showed aortic sclerosis without stenosis.  Currently, patient does not have exacerbation of congestive heart failure.   3.  Type 2 diabetes. Resume home  treatment.  4.  History of stroke. Continue home medicines.  5.  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    Consults: Critical care and ID.  Significant Diagnostic Studies:  CT NECK WITH CONTRAST  TECHNIQUE: Multidetector CT imaging of the neck was performed using the standard protocol following the bolus administration of intravenous contrast.  CONTRAST:  73mL OMNIPAQUE IOHEXOL 300 MG/ML  SOLN  COMPARISON:  CT angiogram chest/abdomen/pelvis 07/21/2018, head CT 08/03/2016.  FINDINGS: Pharynx and larynx: Streak artifact from dental restoration limits evaluation the oral cavity. Within this limitation, no swelling or discrete mass is appreciated within the oral cavity, pharynx or larynx. There is poor dentition with multiple absent and carious teeth.  Salivary glands: Inflammatory stranding surrounds the right parotid gland. The parotid and submandibular glands are otherwise unremarkable.  Thyroid: Subcentimeter thyroid nodule within the isthmus not meeting consensus criteria for ultrasound follow-up.  Lymph nodes: There is bilateral cervical chain lymphadenopathy, most notably right level I and bilateral level II lymph nodes. Level II lymph nodes measure up to 14 mm in short axis.  Vascular: The major vascular structures of the neck are patent.  Limited intracranial: No abnormality identified.  Visualized orbits: Visualized orbits show no acute finding.  Mastoids and visualized paranasal sinuses: Partial opacification of the left sphenoid sinus. No significant mastoid effusion.  Skeleton: No acute bony abnormality or aggressive osseous lesion.  Upper chest: No consolidation within the imaged lung apices. 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  Other: There is inflammatory stranding within the bilateral periorbital region and along the bridge of the nose extending to the right greater than left  maxillofacial region and caudally within the right greater than left neck. No organized soft  tissue abscess is identified.  IMPRESSION: Inflammatory stranding within the bilateral periorbital region and along the bridge of the nose extending to the right greater than left maxillofacial soft tissues and caudally within the right greater than left neck as detailed. Findings are nonspecific but suggestive of cellulitis. No organized soft tissue abscess is identified.  Cervical lymphadenopathy as described which may be reactive. Close clinical follow-up is recommended with imaging follow-up as clinically warranted to exclude alternative etiologies.  Poor dentition with multiple absent and carious teeth.  Left sphenoid sinusitis.  Redemonstrated sequela of prior thoracic aortic aneurysm endovascular repair, partially visualized.   Electronically Signed   By: Kellie Simmering DO   On: 10/04/2019 17:39   Treatments: antibiotics: Cefazolin and clindamycin.  Discharge Exam: Blood pressure (!) 137/50, pulse (!) 30, temperature 98.1 F (36.7 C), temperature source Oral, resp. rate 18, height 5\' 7"  (1.702 m), weight 99.7 kg, SpO2 100 %. General appearance: alert and cooperative Resp: clear to auscultation bilaterally Cardio: regular rate and rhythm, S1, S2 normal, no murmur, click, rub or gallop GI: soft, non-tender; bowel sounds normal; no masses,  no organomegaly Extremities: extremities normal, atraumatic, no cyanosis or edema Right facial cellulitis much improved.  No significant swelling anymore.  No tenderness.   I reviewed all patient vital signs, her heart rate was between 52 to 66.  Above documentation of HR is not accurate.  Disposition: Discharge disposition: 01-Home or Self Care       Discharge Instructions    Diet - low sodium heart healthy   Complete by: As directed    Increase activity slowly   Complete by: As directed      Allergies as of 10/06/2019   No  Known Allergies     Medication List    TAKE these medications   acetaminophen 325 MG tablet Commonly known as: TYLENOL Take 2 tablets (650 mg total) by mouth every 4 (four) hours as needed for mild pain (temp > 101.5).   atorvastatin 40 MG tablet Commonly known as: LIPITOR Take 1 tablet (40 mg total) by mouth daily at 6 PM.   cephALEXin 500 MG capsule Commonly known as: KEFLEX Take 1 capsule (500 mg total) by mouth 3 (three) times daily for 5 days.   cetirizine 10 MG tablet Commonly known as: ZYRTEC Take 10 mg by mouth daily as needed for allergies.   clindamycin 300 MG capsule Commonly known as: CLEOCIN Take 1 capsule (300 mg total) by mouth 3 (three) times daily for 5 days.   clopidogrel 75 MG tablet Commonly known as: PLAVIX Take 75 mg by mouth daily.   FLUoxetine 40 MG capsule Commonly known as: PROZAC Take 80 mg by mouth daily.   fluticasone 50 MCG/ACT nasal spray Commonly known as: FLONASE Place 1 spray into both nostrils daily.   furosemide 40 MG tablet Commonly known as: LASIX Take 1 tablet (40 mg total) by mouth daily.   insulin detemir 100 UNIT/ML injection Commonly known as: Levemir Inject 0.3 mLs (30 Units total) into the skin daily.   losartan 100 MG tablet Commonly known as: COZAAR Take 100 mg by mouth daily.   metFORMIN 1000 MG tablet Commonly known as: Glucophage Take 1 tablet (1,000 mg total) by mouth daily with breakfast.   metoprolol succinate 100 MG 24 hr tablet Commonly known as: TOPROL-XL Take 100 mg by mouth daily.   multivitamin with minerals Tabs tablet Take 1 tablet by mouth daily.   rivaroxaban 20 MG Tabs tablet Commonly known as:  XARELTO Take 20 mg by mouth daily with supper.   spironolactone 25 MG tablet Commonly known as: ALDACTONE Take 1 tablet (25 mg total) by mouth daily.      Follow-up Information    Abram Sander, MD Follow up in 1 week(s).   Specialty: Family Medicine Contact information: [address] [place] [address] [phone]          35 minutes  Signed: Marrion Coy 10/06/2019, 8:56 AM

## 2019-10-07 LAB — AEROBIC CULTURE W GRAM STAIN (SUPERFICIAL SPECIMEN): Culture: NORMAL

## 2019-10-08 LAB — AEROBIC CULTURE W GRAM STAIN (SUPERFICIAL SPECIMEN)

## 2019-10-08 LAB — GLUCOSE, CAPILLARY: Glucose-Capillary: 115 mg/dL — ABNORMAL HIGH (ref 70–99)

## 2019-10-09 LAB — CULTURE, BLOOD (ROUTINE X 2)
Culture: NO GROWTH
Culture: NO GROWTH

## 2019-11-07 IMAGING — DX DG CHEST 1V PORT
1 series · 1 of 1 positions shown · non-contrast
Comparison: 05/31/2018.

CLINICAL DATA: Respiratory failure.

EXAM:
PORTABLE CHEST 1 VIEW

[chest ap]
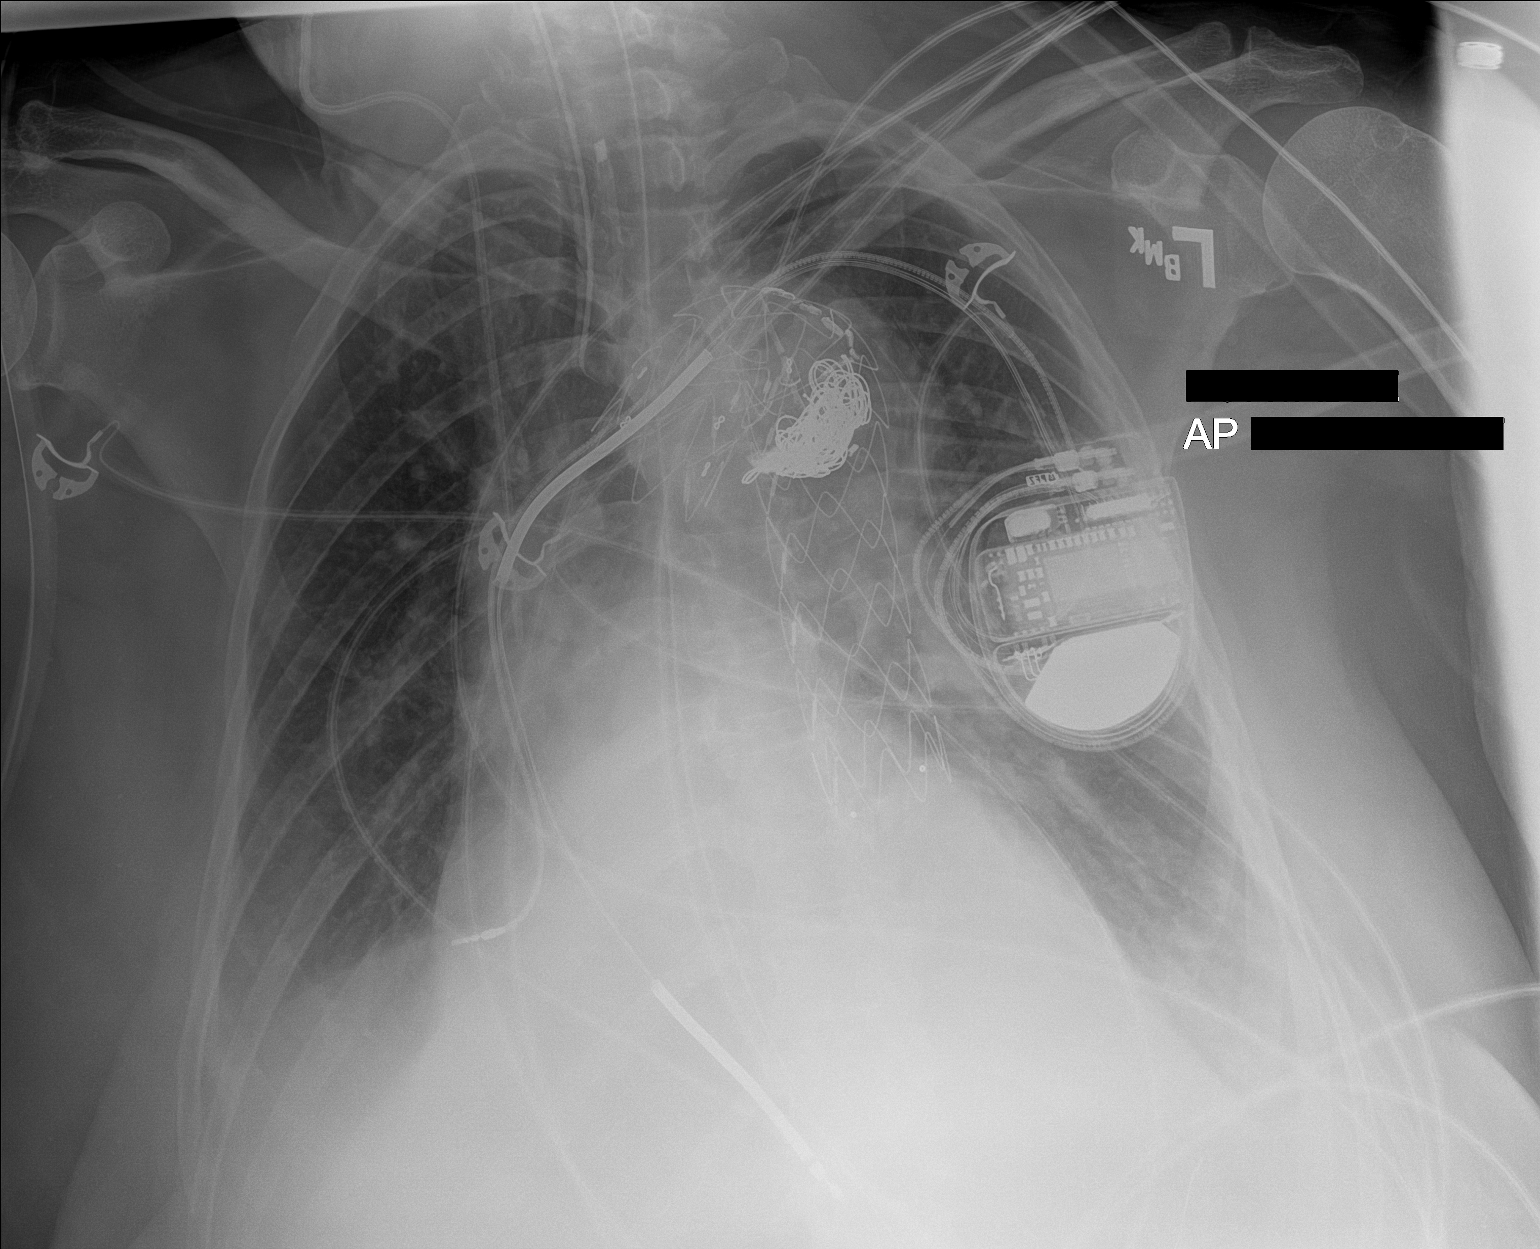

[1 of 1 positions shown; findings below may reference images not displayed]

FINDINGS: Endotracheal tube, NG tube, right IJ line in stable position. AICD
in stable position. Aortic stent graft and coils in stable position.
Stable cardiomegaly. Increased interstitial markings and bilateral
pleural effusions. Findings suggest CHF. Bibasilar pneumonia can not
be excluded. No pneumothorax.
IMPRESSION: 1.  Lines and tubes in stable position.

2. AICD in stable position. Aortic stent graft and coils in stable
position. Stable cardiomegaly.

3. Increased basilar interstitial markings and bilateral pleural
effusions. Findings suggest CHF. Bibasilar pneumonia can not be
excluded.

## 2019-11-08 IMAGING — DX DG CHEST 1V PORT
1 series · 1 of 1 positions shown · non-contrast
Comparison: 06/01/2018.

CLINICAL DATA: Acute respiratory failure.

EXAM:
PORTABLE CHEST 1 VIEW

[chest ap]
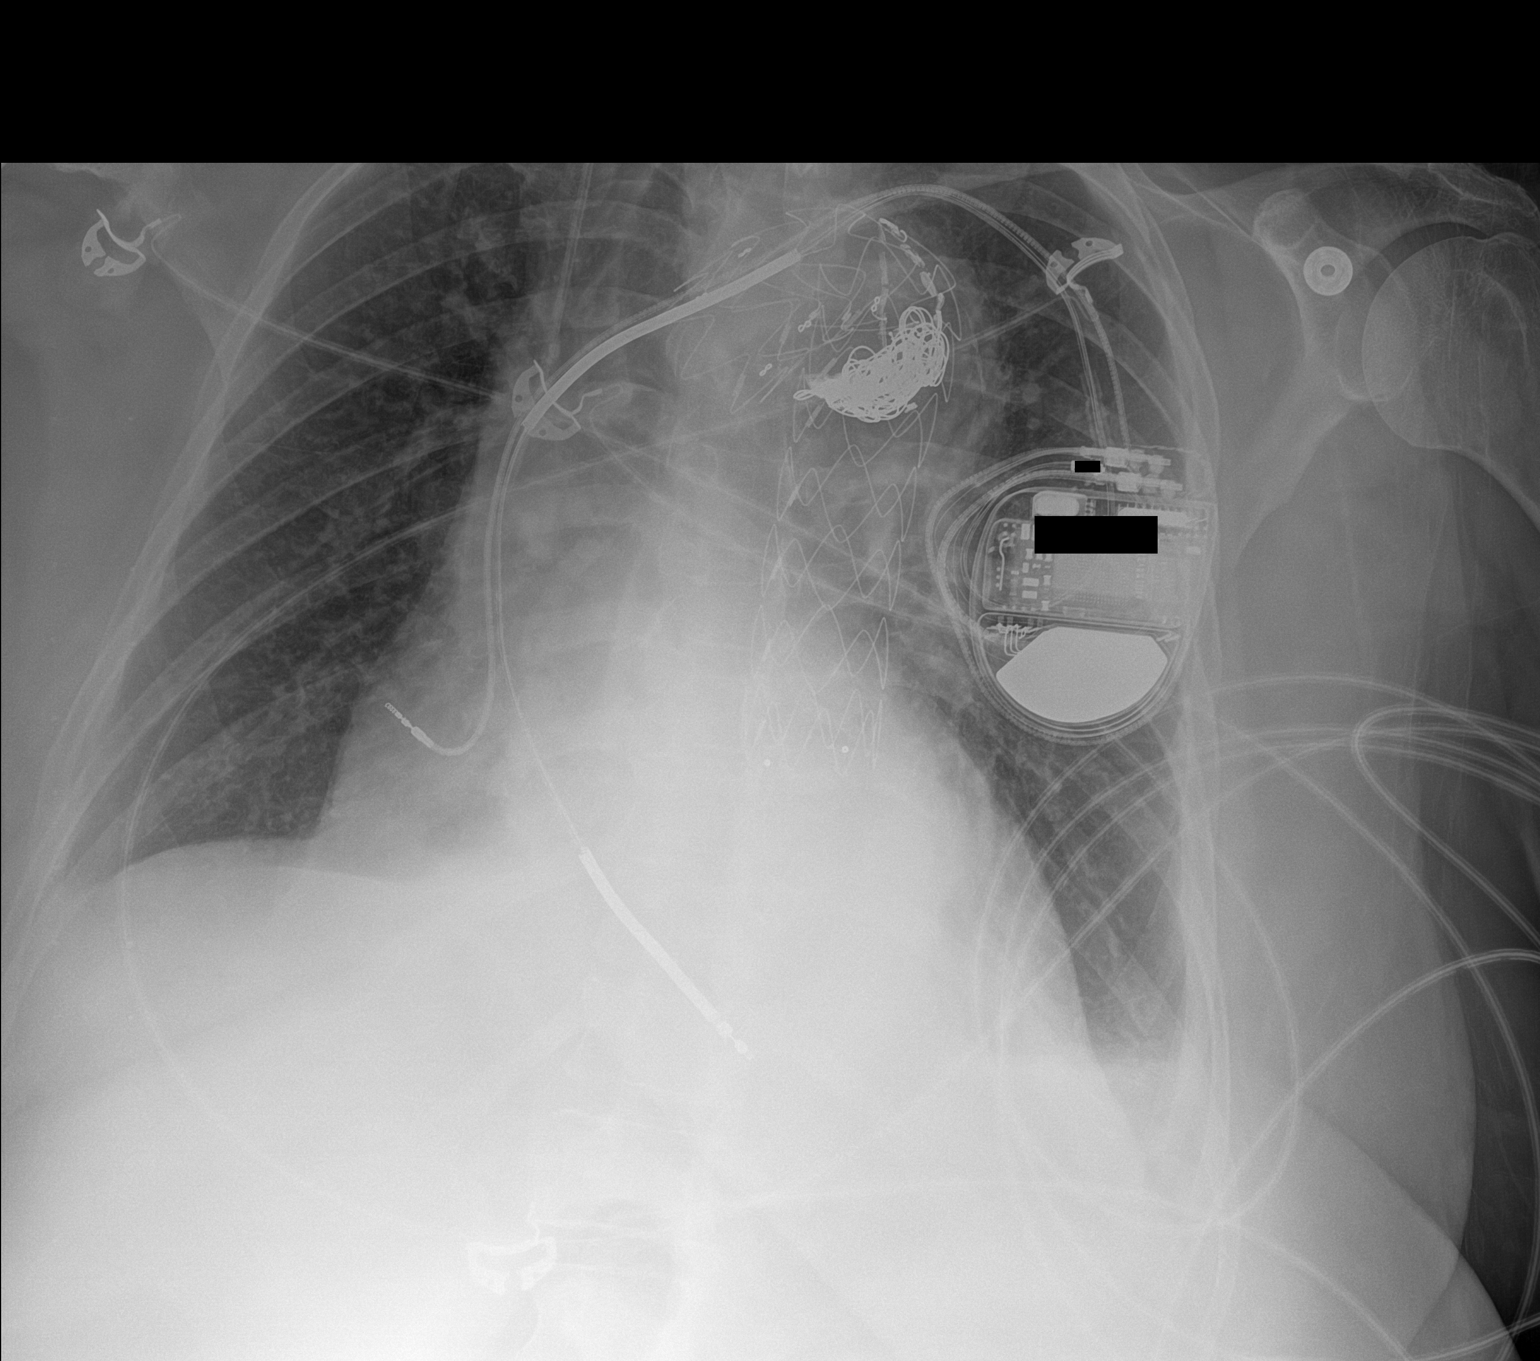

[1 of 1 positions shown; findings below may reference images not displayed]

FINDINGS: The heart is enlarged. ET tube removed. Unchanged central venous
catheter tip distal SVC. Unchanged AICD device. Aortic stent graft.
Improved aeration, with clearing of edema and basilar atelectasis.
Small LEFT effusion remains.
IMPRESSION: Improved aeration.

## 2019-12-27 ENCOUNTER — Emergency Department
Admission: EM | Admit: 2019-12-27 | Discharge: 2019-12-27 | Disposition: A | Discharge status: Home / Self Care | Payer: Medicare Other | Attending: Emergency Medicine | Admitting: Emergency Medicine

## 2019-12-27 ENCOUNTER — Other Ambulatory Visit: Payer: Self-pay

## 2019-12-27 ENCOUNTER — Encounter: Payer: Self-pay | Admitting: Emergency Medicine

## 2019-12-27 DIAGNOSIS — Z87891 Personal history of nicotine dependence: Secondary | ICD-10-CM | POA: Diagnosis not present

## 2019-12-27 DIAGNOSIS — I5022 Chronic systolic (congestive) heart failure: Secondary | ICD-10-CM | POA: Diagnosis not present

## 2019-12-27 DIAGNOSIS — E1165 Type 2 diabetes mellitus with hyperglycemia: Secondary | ICD-10-CM | POA: Insufficient documentation

## 2019-12-27 DIAGNOSIS — Z79899 Other long term (current) drug therapy: Secondary | ICD-10-CM | POA: Insufficient documentation

## 2019-12-27 DIAGNOSIS — Z8673 Personal history of transient ischemic attack (TIA), and cerebral infarction without residual deficits: Secondary | ICD-10-CM | POA: Insufficient documentation

## 2019-12-27 DIAGNOSIS — I2510 Atherosclerotic heart disease of native coronary artery without angina pectoris: Secondary | ICD-10-CM | POA: Diagnosis not present

## 2019-12-27 DIAGNOSIS — I110 Hypertensive heart disease with heart failure: Secondary | ICD-10-CM | POA: Diagnosis not present

## 2019-12-27 DIAGNOSIS — Z7902 Long term (current) use of antithrombotics/antiplatelets: Secondary | ICD-10-CM | POA: Diagnosis not present

## 2019-12-27 DIAGNOSIS — Z794 Long term (current) use of insulin: Secondary | ICD-10-CM | POA: Diagnosis not present

## 2019-12-27 DIAGNOSIS — R739 Hyperglycemia, unspecified: Secondary | ICD-10-CM

## 2019-12-27 LAB — URINALYSIS, COMPLETE (UACMP) WITH MICROSCOPIC
Bacteria, UA: NONE SEEN
Bilirubin Urine: NEGATIVE
Glucose, UA: >=500 mg/dL — AB
Hgb urine dipstick: NEGATIVE
Ketones, ur: NEGATIVE mg/dL
Leukocytes,Ua: NEGATIVE
Nitrite: NEGATIVE
Protein, ur: NEGATIVE mg/dL
Specific Gravity, Urine: 1.027 (ref 1.005–1.030)
pH: 5 (ref 5.0–8.0)

## 2019-12-27 LAB — CBC
HCT: 35 % — ABNORMAL LOW (ref 36.0–46.0)
Hemoglobin: 11.9 g/dL — ABNORMAL LOW (ref 12.0–15.0)
MCH: 27.1 pg (ref 26.0–34.0)
MCHC: 34 g/dL (ref 30.0–36.0)
MCV: 79.7 fL — ABNORMAL LOW (ref 80.0–100.0)
Platelets: 240 10*3/uL (ref 150–400)
RBC: 4.39 MIL/uL (ref 3.87–5.11)
RDW: 15.9 % — ABNORMAL HIGH (ref 11.5–15.5)
WBC: 7.1 10*3/uL (ref 4.0–10.5)
nRBC: 0 % (ref 0.0–0.2)

## 2019-12-27 LAB — GLUCOSE, CAPILLARY
Glucose-Capillary: >600 mg/dL (ref 70–99)
Glucose-Capillary: 426 mg/dL — ABNORMAL HIGH (ref 70–99)
Glucose-Capillary: 144 mg/dL — ABNORMAL HIGH (ref 70–99)

## 2019-12-27 LAB — BASIC METABOLIC PANEL
Anion gap: 12 (ref 5–15)
BUN: 22 mg/dL (ref 8–23)
CO2: 23 mmol/L (ref 22–32)
Calcium: 8.4 mg/dL — ABNORMAL LOW (ref 8.9–10.3)
Chloride: 96 mmol/L — ABNORMAL LOW (ref 98–111)
Creatinine, Ser: 0.99 mg/dL (ref 0.44–1.00)
GFR calc Af Amer: >60 mL/min (ref >60)
GFR calc non Af Amer: 59 mL/min — ABNORMAL LOW (ref >60)
Glucose, Bld: 634 mg/dL (ref 70–99)
Potassium: 4.9 mmol/L (ref 3.5–5.1)
Sodium: 131 mmol/L — ABNORMAL LOW (ref 135–145)

## 2019-12-27 MED ORDER — INSULIN ASPART 100 UNIT/ML ~~LOC~~ SOLN
8.0000 [IU] | Freq: Once | SUBCUTANEOUS | Status: AC
  Administered 2019-12-27: 8 [IU] via INTRAVENOUS
  Filled 2019-12-27: qty 1

## 2019-12-27 MED ORDER — SODIUM CHLORIDE 0.9 % IV BOLUS
1000.0000 mL | Freq: Once | INTRAVENOUS | Status: AC
  Administered 2019-12-27: 1000 mL via INTRAVENOUS

## 2019-12-27 NOTE — ED Notes (Addendum)
pts cbg 486

## 2019-12-27 NOTE — ED Provider Notes (Signed)
[facility] Emergency Department Provider Note   ____________________________________________   First MD Initiated Contact with Patient 12/27/19 1935     (approximate)  I have reviewed the triage vital signs and the nursing notes.   HISTORY  Chief Complaint Hyperglycemia    HPI Desiree Mccall is a 69 y.o. 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  Patient states that over the past week her blood sugar has been running high, often higher than 500 and never lower than 260.  She states that nothing has changed with her diabetic regimen and she has been taking Metformin as well as Lantus consistently.  She has tried to contact her doctor's office to be seen, but they will not be able to get her into the office until next week.  She denies any fevers, cough, chest pain, shortness of breath, dysuria, hematuria, vomiting, or diarrhea.  She does admit that her diet "could be better" and that she often eats mashed potatoes with dinner.        Past Medical History:  Diagnosis Date   Aortic dissection (HCC) 2010   CHF (congestive heart failure) (HCC)    Diabetes mellitus without complication (HCC)    [name]    Heart attack (HCC) sept. 25, 2010    Patient Active Problem List   Diagnosis Date Noted   Sepsis (HCC) 10/04/2019   Facial cellulitis 10/04/2019   Diabetes mellitus without complication (HCC) 10/04/2019   Facial pain 10/04/2019   COVID-19 virus infection 04/02/2019   COVID-19    Acute respiratory failure (HCC)    Sepsis with acute hypoxic respiratory failure without septic shock (HCC) 03/30/2019   Fever of unknown origin 02/27/2019   Chronic systolic CHF (congestive heart failure) (HCC) 05/28/2018   Screening for malignant neoplasm of respiratory organ 07/19/2017   H/O acute  myocardial infarction 05/30/2017   H/O: depression 05/30/2017   Severe tobacco use disorder 05/30/2017   Carotid stenosis, asymptomatic, left 05/04/2017   OSA on CPAP 05/04/2017   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   Cat bite of forearm 11/22/2014   Diabetes mellitus type 2, noninsulin dependent (HCC) 05/08/2014   Syncope 04/10/2013   Cardiac arrest (HCC) 04/10/2013   Automatic implantable cardioverter-defibrillator in situ 07/13/2012   Aortic dissection (HCC) 11/22/2011   CAD (coronary artery disease) 11/22/2011   Dyslipidemia 11/22/2011   History of stroke 11/22/2011   Ischemic cardiomyopathy 11/22/2011   Lumbar disc disease 11/22/2011   PAD (peripheral artery disease) (HCC) 11/22/2011   Pancreatitis due to common bile duct stone 11/22/2011    Past Surgical History:  Procedure Laterality Date   ABDOMINAL HYSTERECTOMY     CARDIAC SURGERY     CAROTID STENT  2010   CHOLECYSTECTOMY     LEFT HEART CATH AND CORONARY ANGIOGRAPHY N/A 06/05/2018   Procedure: LEFT HEART CATH AND CORONARY ANGIOGRAPHY;  Surgeon: Laurier Nancy, MD;  Location: [facility] INVASIVE CV LAB;  Service: Cardiovascular;  Laterality: N/A;   STENT PLACE LEFT URETER ([facility] HX)      Prior to Admission medications   Medication Sig Start Date End Date Taking? Authorizing Provider  acetaminophen (TYLENOL) 325 MG tablet Take 2 tablets (650 mg total) by mouth every 4 (four) hours as needed for mild pain (temp > 101.5). 06/05/18   Judithe Modest, NP  atorvastatin (LIPITOR) 40 MG tablet Take 1 tablet (  40 mg total) by mouth daily at 6 PM. 06/06/18   Judithe Modest, NP  cetirizine (ZYRTEC) 10 MG tablet Take 10 mg by mouth daily as needed for allergies.    [provider]  clopidogrel (PLAVIX) 75 MG tablet Take 75 mg by mouth daily.    [provider]  FLUoxetine (PROZAC) 40 MG capsule Take 80 mg by mouth daily.  03/12/15   [provider]  fluticasone (FLONASE) 50 MCG/ACT  nasal spray Place 1 spray into both nostrils daily.    [provider]  furosemide (LASIX) 40 MG tablet Take 1 tablet (40 mg total) by mouth daily. 04/07/19   Leroy Sea, MD  insulin detemir (LEVEMIR) 100 UNIT/ML injection Inject 0.3 mLs (30 Units total) into the skin daily. 04/04/19   Ghimire, Werner Lean, MD  losartan (COZAAR) 100 MG tablet Take 100 mg by mouth daily. 09/25/19   [provider]  metFORMIN (GLUCOPHAGE) 1000 MG tablet Take 1 tablet (1,000 mg total) by mouth daily with breakfast. 04/04/19 04/03/20  Ghimire, Werner Lean, MD  metoprolol succinate (TOPROL-XL) 100 MG 24 hr tablet Take 100 mg by mouth daily. 10/22/18   [provider]  Multiple Vitamin (MULTIVITAMIN WITH MINERALS) TABS tablet Take 1 tablet by mouth daily.    [provider]  rivaroxaban (XARELTO) 20 MG TABS tablet Take 20 mg by mouth daily with supper.    [provider]  spironolactone (ALDACTONE) 25 MG tablet Take 1 tablet (25 mg total) by mouth daily. 04/07/19   Leroy Sea, MD    Allergies Patient has no known allergies.  Family History  Problem Relation Age of Onset   Heart failure Mother    Heart failure Father     Social History Social History   Tobacco Use   Smoking status: Former Smoker    Packs/day: 0.50    Types: Cigarettes    Start date: 2012   Smokeless tobacco: Never Used  Substance Use Topics   Alcohol use: Yes    Comment: occasionally   Drug use: No    Review of Systems  Constitutional: No fever/chills.  Positive for hyperglycemia. Eyes: No visual changes. ENT: No sore throat. Cardiovascular: Denies chest pain. Respiratory: Denies shortness of breath. Gastrointestinal: No abdominal pain.  No nausea, no vomiting.  No diarrhea.  No constipation. Genitourinary: Negative for dysuria.  Positive for polyuria. Musculoskeletal: Negative for back pain. Skin: Negative for rash. Neurological: Negative for headaches, focal weakness or  numbness.  ____________________________________________   PHYSICAL EXAM:  VITAL SIGNS: ED Triage Vitals  Enc Vitals Group     BP 12/27/19 1640 (!) 115/45     Pulse Rate 12/27/19 1630 66     Resp 12/27/19 1630 16     Temp 12/27/19 1630 98.2 F (36.8 C)     Temp Source 12/27/19 1630 Oral     SpO2 12/27/19 1630 95 %     Weight 12/27/19 1630 218 lb (98.9 kg)     Height 12/27/19 1630 5\' 7"  (1.702 m)     Head Circumference --      Peak Flow --      Pain Score 12/27/19 1641 0     Pain Loc --      Pain Edu? --      Excl. in GC? --     Constitutional: Alert and oriented. Eyes: Conjunctivae are normal. Head: Atraumatic. Nose: No congestion/rhinnorhea. Mouth/Throat: Mucous membranes are moist. Neck: Normal ROM Cardiovascular: Normal rate, regular rhythm.  Grossly normal heart sounds. Respiratory: Normal respiratory effort.  No retractions. Lungs CTAB. Gastrointestinal: Soft and nontender. No distention. Genitourinary: deferred Musculoskeletal: No lower extremity tenderness nor edema. Neurologic:  Normal speech and language. No gross focal neurologic deficits are appreciated. Skin:  Skin is warm, dry and intact. No rash noted. Psychiatric: Mood and affect are normal. Speech and behavior are normal.  ____________________________________________   LABS (all labs ordered are listed, but only abnormal results are displayed)  Labs Reviewed  GLUCOSE, CAPILLARY - Abnormal; Notable for the following components:      Result Value   Glucose-Capillary >600 (*)    All other components within normal limits  CBC - Abnormal; Notable for the following components:   Hemoglobin 11.9 (*)    HCT 35.0 (*)    MCV 79.7 (*)    RDW 15.9 (*)    All other components within normal limits  URINALYSIS, COMPLETE (UACMP) WITH MICROSCOPIC - Abnormal; Notable for the following components:   Color, Urine STRAW (*)    APPearance CLEAR (*)    Glucose, UA >=500 (*)    All other components within normal  limits  BASIC METABOLIC PANEL - Abnormal; Notable for the following components:   Sodium 131 (*)    Chloride 96 (*)    Glucose, Bld 634 (*)    Calcium 8.4 (*)    GFR calc non Af Amer 59 (*)    All other components within normal limits  GLUCOSE, CAPILLARY - Abnormal; Notable for the following components:   Glucose-Capillary 426 (*)    All other components within normal limits  GLUCOSE, CAPILLARY - Abnormal; Notable for the following components:   Glucose-Capillary 144 (*)    All other components within normal limits  CBG MONITORING, ED  CBG MONITORING, ED     PROCEDURES  Procedure(s) performed (including Critical Care):  Procedures   ____________________________________________   INITIAL IMPRESSION / ASSESSMENT AND PLAN / ED COURSE       69 year old female with past [place] of hyperlipidemia, diabetes, stroke, CAD, CHF, aortic dissection, and 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  She reports that her blood sugars have been running high for about the past week and she has had some polyuria, but otherwise feels well with no apparent infectious symptoms.  She was hydrated with IV fluids in triage, repeat blood sugar at 486 and labs show no evidence of DKA.  Given her history of CHF, we will hold off on further fluid bolus, give dose of insulin and recheck.  Repeat blood glucose with minimal improvement following 8 units of subcutaneous insulin.  Patient was given an additional 8 units of IV insulin and repeat blood glucose has now normalized at 144.  Patient reports feeling much better and she is appropriate for discharge home.  She was counseled to avoid carbohydrates and to return to the ED for new worsening symptoms, otherwise follow-up as soon as possible with her PCP.  Patient agrees with plan.      ____________________________________________   FINAL CLINICAL IMPRESSION(S) / ED DIAGNOSES  Final diagnoses:  Hyperglycemia      ED Discharge Orders    None       Note:  This document was prepared using Dragon voice recognition software and may include unintentional dictation errors.   Chesley Noon, MD 12/27/19 2237

## 2019-12-27 NOTE — ED Triage Notes (Signed)
Pt here with c/o hyperglycemia for the past 2 weeks, blood glucose over 500 intermittently, has been very thirsty, pt states nothing has changed with her insulin doses, Dr office aware and has been working with the office nurse to decrease sugars, cbg in triage over 600. Pt states she feels weak. Nad. Alert and oriented.

## 2020-02-17 ENCOUNTER — Other Ambulatory Visit: Payer: Self-pay | Admitting: Family Medicine

## 2020-02-17 DIAGNOSIS — Z13820 Encounter for screening for osteoporosis: Secondary | ICD-10-CM

## 2020-02-17 DIAGNOSIS — Z1231 Encounter for screening mammogram for malignant neoplasm of breast: Secondary | ICD-10-CM

## 2021-02-12 ENCOUNTER — Other Ambulatory Visit: Payer: Self-pay | Admitting: Family Medicine

## 2021-02-12 DIAGNOSIS — Z1231 Encounter for screening mammogram for malignant neoplasm of breast: Secondary | ICD-10-CM

## 2021-08-16 ENCOUNTER — Other Ambulatory Visit: Payer: Self-pay | Admitting: Family Medicine

## 2021-08-16 DIAGNOSIS — Z1231 Encounter for screening mammogram for malignant neoplasm of breast: Secondary | ICD-10-CM

## 2021-08-16 DIAGNOSIS — Z780 Asymptomatic menopausal state: Secondary | ICD-10-CM

## 2021-09-02 ENCOUNTER — Other Ambulatory Visit: Payer: Self-pay

## 2021-09-02 ENCOUNTER — Emergency Department: Payer: Medicare Other

## 2021-09-02 DIAGNOSIS — Z79899 Other long term (current) drug therapy: Secondary | ICD-10-CM | POA: Insufficient documentation

## 2021-09-02 DIAGNOSIS — E119 Type 2 diabetes mellitus without complications: Secondary | ICD-10-CM | POA: Diagnosis not present

## 2021-09-02 DIAGNOSIS — Z7902 Long term (current) use of antithrombotics/antiplatelets: Secondary | ICD-10-CM | POA: Insufficient documentation

## 2021-09-02 DIAGNOSIS — I110 Hypertensive heart disease with heart failure: Secondary | ICD-10-CM | POA: Diagnosis not present

## 2021-09-02 DIAGNOSIS — Z950 Presence of cardiac pacemaker: Secondary | ICD-10-CM | POA: Insufficient documentation

## 2021-09-02 DIAGNOSIS — R42 Dizziness and giddiness: Secondary | ICD-10-CM | POA: Diagnosis not present

## 2021-09-02 DIAGNOSIS — Z794 Long term (current) use of insulin: Secondary | ICD-10-CM | POA: Insufficient documentation

## 2021-09-02 DIAGNOSIS — Z7984 Long term (current) use of oral hypoglycemic drugs: Secondary | ICD-10-CM | POA: Diagnosis not present

## 2021-09-02 DIAGNOSIS — I509 Heart failure, unspecified: Secondary | ICD-10-CM | POA: Insufficient documentation

## 2021-09-02 DIAGNOSIS — R778 Other specified abnormalities of plasma proteins: Secondary | ICD-10-CM | POA: Diagnosis not present

## 2021-09-02 DIAGNOSIS — Z7901 Long term (current) use of anticoagulants: Secondary | ICD-10-CM | POA: Diagnosis not present

## 2021-09-02 LAB — CBC
HCT: 42.2 % (ref 36.0–46.0)
Hemoglobin: 13.2 g/dL (ref 12.0–15.0)
MCH: 26.1 pg (ref 26.0–34.0)
MCHC: 31.3 g/dL (ref 30.0–36.0)
MCV: 83.6 fL (ref 80.0–100.0)
Platelets: 249 10*3/uL (ref 150–400)
RBC: 5.05 MIL/uL (ref 3.87–5.11)
RDW: 15.5 % (ref 11.5–15.5)
WBC: 9.3 10*3/uL (ref 4.0–10.5)
nRBC: 0 % (ref 0.0–0.2)

## 2021-09-02 LAB — COMPREHENSIVE METABOLIC PANEL
ALT: 19 U/L (ref 0–44)
AST: 21 U/L (ref 15–41)
Albumin: 3.9 g/dL (ref 3.5–5.0)
Alkaline Phosphatase: 107 U/L (ref 38–126)
Anion gap: 11 (ref 5–15)
BUN: 22 mg/dL (ref 8–23)
CO2: 26 mmol/L (ref 22–32)
Calcium: 9.3 mg/dL (ref 8.9–10.3)
Chloride: 103 mmol/L (ref 98–111)
Creatinine, Ser: 0.91 mg/dL (ref 0.44–1.00)
GFR, Estimated: >60 mL/min (ref >60)
Glucose, Bld: 175 mg/dL — ABNORMAL HIGH (ref 70–99)
Potassium: 4 mmol/L (ref 3.5–5.1)
Sodium: 140 mmol/L (ref 135–145)
Total Bilirubin: 0.5 mg/dL (ref 0.3–1.2)
Total Protein: 7.5 g/dL (ref 6.5–8.1)

## 2021-09-02 LAB — TROPONIN I (HIGH SENSITIVITY): Troponin I (High Sensitivity): 20 ng/L — ABNORMAL HIGH (ref <18)

## 2021-09-02 NOTE — ED Triage Notes (Signed)
Pt presents to ER c/o dizziness, nausea, and weakness x3 days.  Dizziness feels the same regardless of position per pt.  Pt states she has hx of vertigo, and a very significant cardiac hx.  Pt denies any chest pain, abd pain or back pain.  Pt denies any new unilateral weakness, or other neurological deficits.  Pt is A&O x4 at this time in NAD in triage.   ?

## 2021-09-03 ENCOUNTER — Emergency Department: Payer: Medicare Other

## 2021-09-03 ENCOUNTER — Emergency Department
Admission: EM | Admit: 2021-09-03 | Discharge: 2021-09-03 | Disposition: A | Discharge status: Home / Self Care | Payer: Medicare Other | Attending: Emergency Medicine | Admitting: Emergency Medicine

## 2021-09-03 DIAGNOSIS — R42 Dizziness and giddiness: Secondary | ICD-10-CM

## 2021-09-03 LAB — URINALYSIS, ROUTINE W REFLEX MICROSCOPIC
Bacteria, UA: NONE SEEN
Bilirubin Urine: NEGATIVE
Glucose, UA: NEGATIVE mg/dL
Hgb urine dipstick: NEGATIVE
Ketones, ur: NEGATIVE mg/dL
Leukocytes,Ua: NEGATIVE
Nitrite: NEGATIVE
Protein, ur: NEGATIVE mg/dL
Specific Gravity, Urine: 1.038 — ABNORMAL HIGH (ref 1.005–1.030)
pH: 5 (ref 5.0–8.0)

## 2021-09-03 LAB — TROPONIN I (HIGH SENSITIVITY): Troponin I (High Sensitivity): 18 ng/L — ABNORMAL HIGH (ref <18)

## 2021-09-03 LAB — T4, FREE: Free T4: 1.08 ng/dL (ref 0.61–1.12)

## 2021-09-03 LAB — TSH: TSH: 1.748 u[IU]/mL (ref 0.350–4.500)

## 2021-09-03 MED ORDER — SODIUM CHLORIDE 0.9 % IV BOLUS (SEPSIS)
500.0000 mL | Freq: Once | INTRAVENOUS | Status: AC
Start: 2021-09-03 — End: 2021-09-03
  Administered 2021-09-03: 500 mL via INTRAVENOUS

## 2021-09-03 MED ORDER — MECLIZINE HCL 25 MG PO TABS: 25.0000 mg | ORAL_TABLET | Freq: Three times a day (TID) | ORAL | 0 refills | Status: AC | PRN

## 2021-09-03 MED ORDER — MECLIZINE HCL 25 MG PO TABS
25.0000 mg | ORAL_TABLET | Freq: Once | ORAL | Status: AC
  Administered 2021-09-03: 25 mg via ORAL
  Filled 2021-09-03: qty 1

## 2021-09-03 MED ORDER — IOHEXOL 350 MG/ML SOLN
75.0000 mL | Freq: Once | INTRAVENOUS | Status: AC | PRN
  Administered 2021-09-03: 75 mL via INTRAVENOUS

## 2021-09-03 NOTE — ED Notes (Signed)
Secondary RN attempting IV for CT Angio  ?

## 2021-09-03 NOTE — ED Notes (Signed)
[employer] Representative, Renae Fickle, report no abnormal episodes. Last episode of SVT was on Jan 14. Device is functioning WDL. Faxing report.  ?

## 2021-09-03 NOTE — ED Notes (Signed)
Pt offered fluid for PO challenge  ?

## 2021-09-03 NOTE — ED Notes (Signed)
Interrogated [employer] pacemaker.  

## 2021-09-03 NOTE — ED Notes (Addendum)
Pt ambulated to and from the bathroom independently without assistance. Overall states improved dizziness.  ?

## 2021-09-03 NOTE — ED Provider Notes (Signed)
? ?[facility] ?Provider Note ? ? ? Event Date/Time  ? First MD Initiated Contact with Patient 09/03/21 0109   ?  (approximate) ? ? ?History  ? ?Dizziness and Nausea ? ? ?HPI ? ?Desiree Mccall is a 71 y.o. 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  Denies any tinnitus, hearing loss, ear pain.  No headache or head injury.  No numbness, tingling or weakness.  No vision changes.  No chest pain, chest discomfort, shortness of breath.  No nausea, vomiting or diarrhea.  Has had vertigo before but states this was the worst episode she has ever had.  Denies any history of stroke.  No head injury. ? ? ?History provided by patient. ? ? ? ?Past Medical History:  ?Diagnosis Date  ? Aortic dissection (HCC) 05/02/2008  ? Cardiac arrest (HCC) 05/2018  ? CHF (congestive heart failure) (HCC)   ? Diabetes mellitus without complication (HCC)   ? [name]   ? Heart attack (HCC) 01/24/2009  ? ? ?Past Surgical History:  ?Procedure Laterality Date  ? ABDOMINAL HYSTERECTOMY    ? CARDIAC SURGERY    ? CAROTID STENT  2010  ? CHOLECYSTECTOMY    ? LEFT HEART CATH AND CORONARY ANGIOGRAPHY N/A 06/05/2018  ? Procedure: LEFT HEART CATH AND CORONARY ANGIOGRAPHY;  Surgeon: Laurier Nancy, MD;  Location: [facility] INVASIVE CV LAB;  Service: Cardiovascular;  Laterality: N/A;  ? STENT PLACE LEFT URETER ([facility] HX)    ? ? ?MEDICATIONS:  ?Prior to Admission medications   ?Medication Sig Start Date End Date Taking? Authorizing Provider  ?acetaminophen (TYLENOL) 325 MG tablet Take 2 tablets (650 mg total) by mouth every 4 (four) hours as needed  for mild pain (temp > 101.5). 06/05/18   Judithe Modest, NP  ?atorvastatin (LIPITOR) 40 MG tablet Take 1 tablet (40 mg total) by mouth daily at 6 PM. 06/06/18   Judithe Modest, NP  ?cetirizine (ZYRTEC) 10 MG tablet Take 10 mg by mouth daily as needed for allergies.    [provider]  ?clopidogrel (PLAVIX) 75 MG tablet Take 75 mg by mouth daily.    [provider]  ?FLUoxetine (PROZAC) 40 MG capsule Take 80 mg by mouth daily.  03/12/15   [provider]  ?fluticasone (FLONASE) 50 MCG/ACT nasal spray Place 1 spray into both nostrils daily.    [provider]  ?furosemide (LASIX) 40 MG tablet Take 1 tablet (40 mg total) by mouth daily. 04/07/19   Leroy Sea, MD  ?insulin detemir (LEVEMIR) 100 UNIT/ML injection Inject 0.3 mLs (30 Units total) into the skin daily. 04/04/19   Ghimire, Werner Lean, MD  ?losartan (COZAAR) 100 MG tablet Take 100 mg by mouth daily. 09/25/19   [provider]  ?metFORMIN (GLUCOPHAGE) 1000 MG tablet Take 1 tablet (1,000 mg total) by mouth daily with breakfast. 04/04/19 04/03/20  Ghimire, Werner Lean, MD  ?metoprolol succinate (TOPROL-XL) 100 MG 24 hr tablet Take 100 mg by mouth daily. 10/22/18   [provider]  ?Multiple Vitamin (MULTIVITAMIN WITH MINERALS) TABS tablet Take 1 tablet by mouth daily.    [provider]  ?  rivaroxaban (XARELTO) 20 MG TABS tablet Take 20 mg by mouth daily with supper.    [provider]  ?spironolactone (ALDACTONE) 25 MG tablet Take 1 tablet (25 mg total) by mouth daily. 04/07/19   Leroy SeaSingh, Prashant K, MD  ? ? ?Physical Exam  ? ?Triage Vital Signs: ?ED Triage Vitals  ?Enc Vitals Group  ?   BP 09/02/21 1925 (!) 129/56  ?   Pulse Rate 09/02/21 1925 (!) 57  ?   Resp 09/02/21 1925 16  ?   Temp 09/02/21 1925 98 ?F (36.7 ?C)  ?   Temp Source 09/02/21 1925 Oral  ?   SpO2 09/02/21 1925 95 %  ?   Weight 09/02/21 1925 208 lb (94.3 kg)  ?   Height 09/02/21 1925 5\' 7"  (1.702 m)  ?   Head Circumference --   ?    Peak Flow --   ?   Pain Score 09/02/21 1924 0  ?   Pain Loc --   ?   Pain Edu? --   ?   Excl. in GC? --   ? ? ?Most recent vital signs: ?Vitals:  ? 09/03/21 0330 09/03/21 0400  ?BP: (!) 143/57 136/62  ?Pulse: (!) 55 (!) 51  ?Resp: 16 (!) 22  ?Temp:    ?SpO2: 100% 100%  ? ? ?CONSTITUTIONAL: Alert and oriented and responds appropriately to questions. Well-appearing; well-nourished ?HEAD: Normocephalic, atraumatic ?EYES: Conjunctivae clear, pupils appear equal, sclera nonicteric ?ENT: normal nose; moist mucous membranes; TMs are clear bilaterally without erythema, purulence, bulging, perforation, effusion.  No cerumen impaction or sign of foreign body in the external auditory canal. No inflammation, erythema or drainage from the external auditory canal. No signs of mastoiditis. No pain with manipulation of the pinna bilaterally. ?NECK: Supple, normal ROM ?CARD: RRR; S1 and S2 appreciated; no murmurs, no clicks, no rubs, no gallops ?RESP: Normal chest excursion without splinting or tachypnea; breath sounds clear and equal bilaterally; no wheezes, no rhonchi, no rales, no hypoxia or respiratory distress, speaking full sentences ?ABD/GI: Normal bowel sounds; non-distended; soft, non-tender, no rebound, no guarding, no peritoneal signs ?BACK: The back appears normal ?EXT: Normal ROM in all joints; no deformity noted, no edema; no cyanosis, no calf tenderness or calf swelling ?SKIN: Normal color for age and race; warm; no rash on exposed skin ?NEURO: Moves all extremities equally, normal speech, normal sensation diffusely, cranial nerves II through XII intact, gait deferred at this time ?PSYCH: The patient's mood and manner are appropriate. ? ? ?ED Results / Procedures / Treatments  ? ?LABS: ?(all labs ordered are listed, but only abnormal results are displayed) ?Labs Reviewed  ?COMPREHENSIVE METABOLIC PANEL - Abnormal; Notable for the following components:  ?    Result Value  ? Glucose, Bld 175 (*)   ? All other  components within normal limits  ?URINALYSIS, ROUTINE W REFLEX MICROSCOPIC - Abnormal; Notable for the following components:  ? Color, Urine YELLOW (*)   ? APPearance CLEAR (*)   ? Specific Gravity, Urine 1.038 (*)   ? All other components within normal limits  ?TROPONIN I (HIGH SENSITIVITY) - Abnormal; Notable for the following components:  ? Troponin I (High Sensitivity) 20 (*)   ? All other components within normal limits  ?TROPONIN I (HIGH SENSITIVITY) - Abnormal; Notable for the following components:  ? Troponin I (High Sensitivity) 18 (*)   ? All other components within normal limits  ?CBC  ?TSH  ?T4, FREE  ? ? ? ?EKG: ?  EKG Interpretation ? ?Date/Time:  Thursday Sep 02 2021 19:32:03 EDT ?Ventricular Rate:  55 ?PR Interval:  142 ?QRS Duration: 122 ?QT Interval:  422 ?QTC Calculation: 403 ?R Axis:   -44 ?Text Interpretation: Sinus bradycardia Left axis deviation Left ventricular hypertrophy with QRS widening ( R in aVL , Cornell product , Romhilt-Estes ) Cannot rule out Septal infarct , age undetermined Abnormal ECG When compared with ECG of 04-Oct-2019 11:32, PREVIOUS ECG IS PRESENT Confirmed by Rochele Raring [phone]) on 09/03/2021 2:08:01 AM ?  ? ?  ? ? ? ?RADIOLOGY: ?My personal review and interpretation of imaging: CTA of the head and neck and CT of the head showed no acute abnormality. ? ?I have personally reviewed all radiology reports.   ?CT ANGIO HEAD NECK W WO CM ? ?Result Date: 09/03/2021 ?CLINICAL DATA:  Stroke follow-up EXAM: CT ANGIOGRAPHY HEAD AND NECK TECHNIQUE: Multidetector CT imaging of the head and neck was performed using the standard protocol during bolus administration of intravenous contrast. Multiplanar CT image reconstructions and MIPs were obtained to evaluate the vascular anatomy. Carotid stenosis measurements (when applicable) are obtained utilizing NASCET criteria, using the distal internal carotid diameter as the denominator. RADIATION DOSE REDUCTION: This exam was performed according  to the departmental dose-optimization program which includes automated exposure control, adjustment of the mA and/or kV according to patient size and/or use of iterative reconstruction technique. CONTRAST:  7

## 2021-09-03 NOTE — ED Notes (Signed)
Discharge instructions discussed with pt. Pt verbalized understanding with no questions at this time. Pt to go home with friend 

## 2021-09-03 NOTE — ED Notes (Signed)
Pt is tolerating fluids  
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
## 2021-10-14 ENCOUNTER — Ambulatory Visit
Admission: RE | Admit: 2021-10-14 | Discharge: 2021-10-14 | Disposition: A | Discharge status: Home / Self Care | Payer: Medicare Other | Source: Ambulatory Visit | Attending: Family Medicine | Admitting: Family Medicine

## 2021-10-14 DIAGNOSIS — Z1231 Encounter for screening mammogram for malignant neoplasm of breast: Secondary | ICD-10-CM | POA: Insufficient documentation

## 2021-10-14 DIAGNOSIS — Z780 Asymptomatic menopausal state: Secondary | ICD-10-CM | POA: Diagnosis present

## 2021-10-18 ENCOUNTER — Encounter: Payer: Self-pay | Admitting: Psychiatry

## 2021-10-18 ENCOUNTER — Ambulatory Visit (INDEPENDENT_AMBULATORY_CARE_PROVIDER_SITE_OTHER): Payer: Medicare Other | Admitting: Psychiatry

## 2021-10-18 VITALS — BP 158/79 | HR 65 | Temp 98.1°F | Wt 205.8 lb

## 2021-10-18 DIAGNOSIS — F4310 Post-traumatic stress disorder, unspecified: Secondary | ICD-10-CM

## 2021-10-18 DIAGNOSIS — F331 Major depressive disorder, recurrent, moderate: Secondary | ICD-10-CM | POA: Diagnosis not present

## 2021-10-18 MED ORDER — SERTRALINE HCL 50 MG PO TABS: ORAL_TABLET | ORAL | 1 refills | Status: DC

## 2021-10-18 NOTE — Patient Instructions (Signed)
Discontinue fluoxetine (was on 80 mg daily) Start sertraline 25 mg daily for one week, then 50 mg daily  Referral to IOP Next appointment: 7/20 at 8:30, in person

## 2021-10-19 ENCOUNTER — Telehealth (HOSPITAL_COMMUNITY): Payer: Self-pay | Admitting: Psychiatry

## 2021-10-19 NOTE — Telephone Encounter (Signed)
D:  Dr. Vanetta Shawl referred pt to [facility].  A:  Placed call to orient pt.  Pt states she would like to verify her benefits before making a decision.  Inform Dr. Vanetta Shawl.  Encouraged pt to call the case manager back once she verifies her benefits.  R:  Pt receptive.
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
## 2021-11-18 ENCOUNTER — Ambulatory Visit: Payer: Medicare Other | Admitting: Psychiatry
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
## 2021-11-30 ENCOUNTER — Telehealth: Payer: Medicare PPO | Admitting: Psychiatry

## 2021-11-30 ENCOUNTER — Telehealth: Payer: Self-pay | Admitting: Psychiatry

## 2021-11-30 NOTE — Telephone Encounter (Signed)
Sent link for video visit through Epic. Patient did not sign in. Called the patient for appointment scheduled today. The patient did not answer the phone. Left voice message to contact the office ([phone]).   ?

## 2021-12-06 ENCOUNTER — Encounter: Payer: Self-pay | Admitting: Psychiatry

## 2021-12-06 ENCOUNTER — Telehealth (INDEPENDENT_AMBULATORY_CARE_PROVIDER_SITE_OTHER): Payer: Medicare PPO | Admitting: Psychiatry

## 2021-12-06 DIAGNOSIS — F3341 Major depressive disorder, recurrent, in partial remission: Secondary | ICD-10-CM

## 2021-12-06 DIAGNOSIS — F4310 Post-traumatic stress disorder, unspecified: Secondary | ICD-10-CM | POA: Diagnosis not present

## 2021-12-06 MED ORDER — SERTRALINE HCL 50 MG PO TABS: 50.0000 mg | ORAL_TABLET | Freq: Every day | ORAL | 0 refills | Status: DC
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
## 2022-02-16 ENCOUNTER — Telehealth (INDEPENDENT_AMBULATORY_CARE_PROVIDER_SITE_OTHER): Payer: Medicare PPO | Admitting: Psychiatry

## 2022-02-16 ENCOUNTER — Encounter: Payer: Self-pay | Admitting: Psychiatry

## 2022-02-16 DIAGNOSIS — F3341 Major depressive disorder, recurrent, in partial remission: Secondary | ICD-10-CM

## 2022-02-16 DIAGNOSIS — F17200 Nicotine dependence, unspecified, uncomplicated: Secondary | ICD-10-CM

## 2022-02-16 DIAGNOSIS — F4310 Post-traumatic stress disorder, unspecified: Secondary | ICD-10-CM

## 2022-02-16 MED ORDER — NICOTINE 14 MG/24HR TD PT24: 14.0000 mg | MEDICATED_PATCH | Freq: Every day | TRANSDERMAL | 1 refills | Status: AC

## 2022-02-16 MED ORDER — SERTRALINE HCL 50 MG PO TABS: 50.0000 mg | ORAL_TABLET | Freq: Every day | ORAL | 0 refills | Status: DC

## 2022-02-16 NOTE — Patient Instructions (Signed)
Continue sertraline 50 mg daily  Referral to therapy Next appointment: 11/22 at 3 PM

## 2022-03-02 ENCOUNTER — Telehealth: Payer: Self-pay | Admitting: *Deleted

## 2022-03-02 NOTE — Telephone Encounter (Signed)
[facility]  sent over a refill request --- Called to verify with Patient & she ok' d
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
## 2022-03-23 ENCOUNTER — Encounter: Payer: Self-pay | Admitting: Psychiatry

## 2022-03-23 ENCOUNTER — Telehealth (INDEPENDENT_AMBULATORY_CARE_PROVIDER_SITE_OTHER): Payer: Medicare PPO | Admitting: Psychiatry

## 2022-03-23 DIAGNOSIS — F3341 Major depressive disorder, recurrent, in partial remission: Secondary | ICD-10-CM

## 2022-03-23 DIAGNOSIS — F17210 Nicotine dependence, cigarettes, uncomplicated: Secondary | ICD-10-CM

## 2022-03-23 DIAGNOSIS — F4310 Post-traumatic stress disorder, unspecified: Secondary | ICD-10-CM | POA: Diagnosis not present

## 2022-03-23 DIAGNOSIS — F17200 Nicotine dependence, unspecified, uncomplicated: Secondary | ICD-10-CM

## 2022-04-14 ENCOUNTER — Ambulatory Visit: Payer: Medicare PPO | Admitting: Licensed Clinical Social Worker

## 2022-04-26 ENCOUNTER — Ambulatory Visit (INDEPENDENT_AMBULATORY_CARE_PROVIDER_SITE_OTHER): Payer: Medicare PPO | Admitting: Licensed Clinical Social Worker

## 2022-04-26 DIAGNOSIS — F3341 Major depressive disorder, recurrent, in partial remission: Secondary | ICD-10-CM

## 2022-04-26 DIAGNOSIS — F4310 Post-traumatic stress disorder, unspecified: Secondary | ICD-10-CM | POA: Diagnosis not present
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
## 2022-04-26 NOTE — Progress Notes (Deleted)
CCA on 04/26/2022.
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
## 2022-05-17 ENCOUNTER — Ambulatory Visit: Payer: Medicare PPO | Admitting: Licensed Clinical Social Worker

## 2022-05-18 ENCOUNTER — Encounter: Payer: Self-pay | Admitting: Psychiatry

## 2022-05-18 ENCOUNTER — Telehealth (INDEPENDENT_AMBULATORY_CARE_PROVIDER_SITE_OTHER): Payer: Medicare HMO | Admitting: Psychiatry

## 2022-05-18 DIAGNOSIS — F330 Major depressive disorder, recurrent, mild: Secondary | ICD-10-CM

## 2022-05-18 DIAGNOSIS — F4310 Post-traumatic stress disorder, unspecified: Secondary | ICD-10-CM

## 2022-05-18 MED ORDER — SERTRALINE HCL 100 MG PO TABS
100.0000 mg | ORAL_TABLET | Freq: Every day | ORAL | 0 refills | Status: DC
Start: 2022-05-25 — End: 2022-06-15

## 2022-05-18 NOTE — Patient Instructions (Signed)
Increase sertraline 100 mg daily  Next appointment: 2/14 at 3 pm
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
## 2022-06-15 ENCOUNTER — Encounter: Payer: Self-pay | Admitting: Psychiatry

## 2022-06-15 ENCOUNTER — Telehealth (INDEPENDENT_AMBULATORY_CARE_PROVIDER_SITE_OTHER): Payer: Medicare PPO | Admitting: Psychiatry

## 2022-06-15 DIAGNOSIS — F4310 Post-traumatic stress disorder, unspecified: Secondary | ICD-10-CM

## 2022-06-15 DIAGNOSIS — F3341 Major depressive disorder, recurrent, in partial remission: Secondary | ICD-10-CM

## 2022-06-15 MED ORDER — SERTRALINE HCL 100 MG PO TABS: 100.0000 mg | ORAL_TABLET | Freq: Every day | ORAL | 0 refills | Status: DC

## 2022-07-01 ENCOUNTER — Other Ambulatory Visit: Payer: Self-pay | Admitting: Psychiatry
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
## 2022-08-17 ENCOUNTER — Encounter: Payer: Self-pay | Admitting: Psychiatry

## 2022-08-17 ENCOUNTER — Telehealth (INDEPENDENT_AMBULATORY_CARE_PROVIDER_SITE_OTHER): Payer: Medicare PPO | Admitting: Psychiatry

## 2022-08-17 DIAGNOSIS — F4310 Post-traumatic stress disorder, unspecified: Secondary | ICD-10-CM

## 2022-08-17 DIAGNOSIS — F3341 Major depressive disorder, recurrent, in partial remission: Secondary | ICD-10-CM

## 2022-08-17 MED ORDER — SERTRALINE HCL 100 MG PO TABS: 150.0000 mg | ORAL_TABLET | Freq: Every day | ORAL | 0 refills | Status: DC

## 2022-08-17 NOTE — Patient Instructions (Signed)
Continue sertraline 150 mg at night   Next appointment: 7/17 at 1 40

## 2022-10-10 ENCOUNTER — Encounter: Payer: Self-pay | Admitting: Ophthalmology

## 2022-10-11 ENCOUNTER — Encounter: Payer: Self-pay | Admitting: Ophthalmology

## 2022-10-11 NOTE — Anesthesia Preprocedure Evaluation (Addendum)
Anesthesia Evaluation  Patient identified by MRN, date of birth, ID band Patient awake    Reviewed: Allergy & Precautions, H&P , NPO status , Patient's Chart, lab work & pertinent test results  Airway Mallampati: I  TM Distance: <3 FB Neck ROM: Full   Comment: Patient has Mallampatti I airway, but very short TMD, so would expect possible difficult anterior airway if intubation were needed.  Dental no notable dental hx. (+) Upper Dentures   Pulmonary sleep apnea , Current Smoker She is trying very hard to quit smoking, smokes about 1 PPD now, on program at [facility] to help with this.     Pulmonary exam normal breath sounds clear to auscultation       Cardiovascular + CAD, + Past MI, + Peripheral Vascular Disease and +CHF  negative cardio ROS Normal cardiovascular exam+ pacemaker + Cardiac Defibrillator  Rhythm:Regular Rate:Normal  03-01-19 1. Left ventricular ejection fraction, by visual estimation, is 20 to  25%. The left ventricle has severely decreased function. Left ventricular  septal wall thickness was normal. Normal left ventricular posterior wall  thickness. There is no left  ventricular hypertrophy.   2. Moderate to large anteroseptal, apical, anteroapical, anterior and  lateral myocardial infarction.   3. Severely dilated left ventricular internal cavity size.   4. Global right ventricle has moderately reduced systolic function.The  right ventricular size is severely enlarged. No increase in right  ventricular wall thickness.   5. Left atrial size was mildly dilated.   6. Right atrial size was mildly dilated.   7. The mitral valve is abnormal. Mild mitral valve regurgitation.   8. The tricuspid valve is grossly normal. Tricuspid valve regurgitation  mild-moderate.   9. The aortic valve is normal in structure. Aortic valve regurgitation is  trivial. Mild to moderate aortic valve sclerosis/calcification without any  evidence  of aortic stenosis.  10. The pulmonic valve was grossly normal. Pulmonic valve regurgitation is  not visualized.  11. Mildly elevated pulmonary artery systolic pressure.  12. Severe ant/Apical/Septal/Lateral Hypo.  13. Mod to severe LVE.   Cardiac arrest 2020 Pacemaker/AICD evaluation April 2024   Neuro/Psych CVA negative neurological ROS  negative psych ROS   GI/Hepatic negative GI ROS, Neg liver ROS,,,  Endo/Other  diabetes    Renal/GU negative Renal ROS  negative genitourinary   Musculoskeletal negative musculoskeletal ROS (+)    Abdominal   Peds negative pediatric ROS (+)  Hematology negative hematology ROS (+)   Anesthesia Other Findings Aortic dissection 2010 after AMI Heart attack 2010 Diabetes mellitus without complication  CHF (congestive heart failure)  [name]  Cardiac arrest 2022 Wears dentures  AICD (automatic cardioverter/defibrillator) present last time AICD went off was about 2 months ago while she was relaxing in bed, felt "like I was going unconscious" and felt shock of defibrillator, and was "OK" again.  Presence of permanent cardiac pacemaker  Stroke  COVID-19  Sleep apnea    Reproductive/Obstetrics negative OB ROS                             Anesthesia Physical Anesthesia Plan  ASA: 4  Anesthesia Plan: MAC   Post-op Pain Management:    Induction: Intravenous  PONV Risk Score and Plan:   Airway Management Planned: Natural Airway and Nasal Cannula  Additional Equipment:   Intra-op Plan:   Post-operative Plan:   Informed Consent: I have reviewed the patients History and Physical, chart, labs and  discussed the procedure including the risks, benefits and alternatives for the proposed anesthesia with the patient or authorized representative who has indicated his/her understanding and acceptance.     Dental Advisory Given  Plan Discussed with: Anesthesiologist, CRNA and Surgeon  Anesthesia  Plan Comments: (Patient consented for risks of anesthesia including but not limited to:  - adverse reactions to medications - damage to eyes, teeth, lips or other oral mucosa - nerve damage due to positioning  - sore throat or hoarseness - Damage to heart, brain, nerves, lungs, other parts of body or loss of life  Patient voiced understanding.)        Anesthesia Quick Evaluation

## 2022-10-12 ENCOUNTER — Other Ambulatory Visit: Payer: Self-pay | Admitting: Psychiatry

## 2022-10-12 NOTE — Discharge Instructions (Addendum)

## 2022-10-13 ENCOUNTER — Other Ambulatory Visit: Payer: Self-pay

## 2022-10-13 ENCOUNTER — Encounter: Payer: Self-pay | Admitting: Ophthalmology

## 2022-10-13 ENCOUNTER — Ambulatory Visit: Payer: Medicare PPO | Admitting: Anesthesiology

## 2022-10-13 ENCOUNTER — Encounter
Admission: RE | Disposition: A | Discharge status: Home / Self Care | Payer: Self-pay | Source: Ambulatory Visit | Attending: Ophthalmology

## 2022-10-13 ENCOUNTER — Ambulatory Visit
Admission: RE | Admit: 2022-10-13 | Discharge: 2022-10-13 | Disposition: A | Discharge status: Home / Self Care | Payer: Medicare PPO | Source: Ambulatory Visit | Attending: Ophthalmology | Admitting: Ophthalmology

## 2022-10-13 DIAGNOSIS — I509 Heart failure, unspecified: Secondary | ICD-10-CM | POA: Insufficient documentation

## 2022-10-13 DIAGNOSIS — Z8673 Personal history of transient ischemic attack (TIA), and cerebral infarction without residual deficits: Secondary | ICD-10-CM | POA: Diagnosis not present

## 2022-10-13 DIAGNOSIS — F17210 Nicotine dependence, cigarettes, uncomplicated: Secondary | ICD-10-CM | POA: Diagnosis not present

## 2022-10-13 DIAGNOSIS — E1136 Type 2 diabetes mellitus with diabetic cataract: Secondary | ICD-10-CM | POA: Insufficient documentation

## 2022-10-13 DIAGNOSIS — H2511 Age-related nuclear cataract, right eye: Secondary | ICD-10-CM | POA: Insufficient documentation

## 2022-10-13 DIAGNOSIS — G4730 Sleep apnea, unspecified: Secondary | ICD-10-CM | POA: Insufficient documentation

## 2022-10-13 DIAGNOSIS — Z7984 Long term (current) use of oral hypoglycemic drugs: Secondary | ICD-10-CM | POA: Insufficient documentation

## 2022-10-13 DIAGNOSIS — I252 Old myocardial infarction: Secondary | ICD-10-CM | POA: Insufficient documentation

## 2022-10-13 DIAGNOSIS — E1151 Type 2 diabetes mellitus with diabetic peripheral angiopathy without gangrene: Secondary | ICD-10-CM | POA: Insufficient documentation

## 2022-10-13 DIAGNOSIS — I2510 Atherosclerotic heart disease of native coronary artery without angina pectoris: Secondary | ICD-10-CM | POA: Insufficient documentation

## 2022-10-13 DIAGNOSIS — I110 Hypertensive heart disease with heart failure: Secondary | ICD-10-CM | POA: Insufficient documentation

## 2022-10-13 DIAGNOSIS — Z95810 Presence of automatic (implantable) cardiac defibrillator: Secondary | ICD-10-CM | POA: Diagnosis not present

## 2022-10-13 DIAGNOSIS — Z794 Long term (current) use of insulin: Secondary | ICD-10-CM | POA: Diagnosis not present

## 2022-10-13 DIAGNOSIS — Z8249 Family history of ischemic heart disease and other diseases of the circulatory system: Secondary | ICD-10-CM | POA: Diagnosis not present

## 2022-10-13 HISTORY — DX: Presence of automatic (implantable) cardiac defibrillator: Z95.810

## 2022-10-13 HISTORY — DX: Presence of cardiac pacemaker: Z95.0

## 2022-10-13 HISTORY — DX: Cardiomegaly: I51.7

## 2022-10-13 HISTORY — DX: Nonrheumatic mitral (valve) insufficiency: I34.0

## 2022-10-13 HISTORY — DX: Sleep apnea, unspecified: G47.30

## 2022-10-13 HISTORY — DX: Cerebral infarction, unspecified: I63.9

## 2022-10-13 HISTORY — DX: Rheumatic tricuspid insufficiency: I07.1

## 2022-10-13 HISTORY — PX: CATARACT EXTRACTION W/PHACO: SHX586

## 2022-10-13 HISTORY — DX: Secondary pulmonary arterial hypertension: I27.21

## 2022-10-13 HISTORY — DX: Presence of dental prosthetic device (complete) (partial): Z97.2

## 2022-10-13 LAB — GLUCOSE, CAPILLARY: Glucose-Capillary: 109 mg/dL — ABNORMAL HIGH (ref 70–99)

## 2022-10-13 SURGERY — PHACOEMULSIFICATION, CATARACT, WITH IOL INSERTION
Anesthesia: Monitor Anesthesia Care | Site: Eye | Laterality: Right

## 2022-10-13 MED ORDER — MOXIFLOXACIN HCL 0.5 % OP SOLN
OPHTHALMIC | Status: DC | PRN
  Administered 2022-10-13: .2 mL via OPHTHALMIC

## 2022-10-13 MED ORDER — BRIMONIDINE TARTRATE-TIMOLOL 0.2-0.5 % OP SOLN
OPHTHALMIC | Status: DC | PRN
  Administered 2022-10-13: 1 [drp] via OPHTHALMIC

## 2022-10-13 MED ORDER — LIDOCAINE HCL (PF) 2 % IJ SOLN
INTRAOCULAR | Status: DC | PRN
  Administered 2022-10-13: 1 mL via INTRAOCULAR

## 2022-10-13 MED ORDER — SODIUM CHLORIDE 0.9% FLUSH
INTRAVENOUS | Status: DC | PRN
  Administered 2022-10-13: 10 mL via INTRAVENOUS

## 2022-10-13 MED ORDER — LACTATED RINGERS IV SOLN: INTRAVENOUS | Status: DC

## 2022-10-13 MED ORDER — FENTANYL CITRATE (PF) 100 MCG/2ML IJ SOLN
INTRAMUSCULAR | Status: DC | PRN
  Administered 2022-10-13: 25 ug via INTRAVENOUS

## 2022-10-13 MED ORDER — SIGHTPATH DOSE#1 BSS IO SOLN
INTRAOCULAR | Status: DC | PRN
  Administered 2022-10-13: 64 mL via OPHTHALMIC

## 2022-10-13 MED ORDER — ARMC OPHTHALMIC DILATING DROPS
1.0000 | OPHTHALMIC | Status: DC | PRN
  Administered 2022-10-13 (×3): 1 via OPHTHALMIC

## 2022-10-13 MED ORDER — MIDAZOLAM HCL 2 MG/2ML IJ SOLN
INTRAMUSCULAR | Status: DC | PRN
  Administered 2022-10-13: 1 mg via INTRAVENOUS

## 2022-10-13 MED ORDER — SIGHTPATH DOSE#1 NA HYALUR & NA CHOND-NA HYALUR IO KIT
PACK | INTRAOCULAR | Status: DC | PRN
  Administered 2022-10-13: 1 via OPHTHALMIC

## 2022-10-13 MED ORDER — TETRACAINE HCL 0.5 % OP SOLN
1.0000 [drp] | OPHTHALMIC | Status: DC | PRN
  Administered 2022-10-13 (×3): 1 [drp] via OPHTHALMIC

## 2022-10-13 MED ORDER — SIGHTPATH DOSE#1 BSS IO SOLN
INTRAOCULAR | Status: DC | PRN
  Administered 2022-10-13: 15 mL

## 2022-10-13 SURGICAL SUPPLY — 9 items
CATARACT SUITE SIGHTPATH (MISCELLANEOUS) ×1 IMPLANT
DISSECTOR HYDRO NUCLEUS 50X22 (MISCELLANEOUS) ×1 IMPLANT
DRSG TEGADERM 2-3/8X2-3/4 SM (GAUZE/BANDAGES/DRESSINGS) ×1 IMPLANT
FEE CATARACT SUITE SIGHTPATH (MISCELLANEOUS) ×1 IMPLANT
GLOVE SURG SYN 7.5 E (GLOVE) ×1 IMPLANT
GLOVE SURG SYN 7.5 PF PI (GLOVE) ×1 IMPLANT
GLOVE SURG SYN 8.5 E (GLOVE) ×1 IMPLANT
GLOVE SURG SYN 8.5 PF PI (GLOVE) ×1 IMPLANT
LENS IOL TECNIS EYHANCE 20.0 (Intraocular Lens) IMPLANT

## 2022-10-13 NOTE — Op Note (Signed)
OPERATIVE NOTE  Desiree Mccall [id number] 10/13/2022   PREOPERATIVE DIAGNOSIS: Nuclear sclerotic cataract right eye. H25.11   POSTOPERATIVE DIAGNOSIS: Nuclear sclerotic cataract right eye. H25.11   PROCEDURE:  Phacoemusification with posterior chamber intraocular lens placement of the right eye  Ultrasound time: Procedure(s) with comments: CATARACT EXTRACTION PHACO AND INTRAOCULAR LENS PLACEMENT (IOC) RIGHT DIABETIC  9.39  00:59.6 (Right) - Diabetic  LENS:   Implant Name Type Inv. Item Serial No. Manufacturer Lot No. LRB No. Used Action  LENS IOL TECNIS EYHANCE 20.0 - [id number] Intraocular Lens LENS IOL TECNIS EYHANCE 20.0 [id number] [employer]  Right 1 Implanted      SURGEON:  Julious Payer. Rolley Sims, MD   ANESTHESIA:  Topical with tetracaine drops, augmented with 1% preservative-free intracameral lidocaine.   COMPLICATIONS:  None.   DESCRIPTION OF PROCEDURE:  The patient was identified in the holding room and transported to the operating room and placed in the supine position under the operating microscope.  The right eye was identified as the operative eye, which was prepped and draped in the usual sterile ophthalmic fashion.   A 1 millimeter clear-corneal paracentesis was made superotemporally. Preservative-free 1% lidocaine mixed with 1:1,000 bisulfite-free aqueous solution of epinephrine was injected into the anterior chamber. The anterior chamber was then filled with Viscoat viscoelastic. A 2.4 millimeter keratome was used to make a clear-corneal incision inferotemporally. A curvilinear capsulorrhexis was made with a cystotome and capsulorrhexis forceps. Balanced salt solution was used to hydrodissect and hydrodelineate the nucleus. Phacoemulsification was then used to remove the lens nucleus and epinucleus. The remaining cortex was then removed using the irrigation and aspiration handpiece. Provisc was then placed into the capsular bag to distend it for lens placement. A +20.00 D DIB00  intraocular lens was then injected into the capsular bag. The remaining viscoelastic was aspirated.   Wounds were hydrated with balanced salt solution.  The anterior chamber was inflated to a physiologic pressure with balanced salt solution.  No wound leaks were noted. Vigamox was injected intracamerally.  Timolol and Brimonidine drops were applied to the eye.  The patient was taken to the recovery room in stable condition without complications of anesthesia or surgery.  [name] [name] 10/13/2022, 12:23 PM

## 2022-10-13 NOTE — Anesthesia Postprocedure Evaluation (Signed)
Anesthesia Post Note  Patient: Leilani Merl  Procedure(s) Performed: CATARACT EXTRACTION PHACO AND INTRAOCULAR LENS PLACEMENT (IOC) RIGHT DIABETIC  9.39  00:59.6 (Right: Eye)  Patient location during evaluation: PACU Anesthesia Type: MAC Level of consciousness: awake and alert Pain management: pain level controlled Vital Signs Assessment: post-procedure vital signs reviewed and stable Respiratory status: spontaneous breathing, nonlabored ventilation, respiratory function stable and patient connected to nasal cannula oxygen Cardiovascular status: stable and blood pressure returned to baseline Postop Assessment: no apparent nausea or vomiting Anesthetic complications: no   No notable events documented.   Last Vitals:  Vitals:   10/13/22 1225 10/13/22 1230  BP: (!) 151/81 (!) 141/71  Pulse: (!) 57 (!) 58  Resp: 19 14  Temp: (!) 36.4 C (!) 36.4 C  SpO2: 94% 96%    Last Pain:  Vitals:   10/13/22 1230  TempSrc:   PainSc: 0-No pain                 Crisanto Nied C Jese Comella

## 2022-10-13 NOTE — Transfer of Care (Signed)
Immediate Anesthesia Transfer of Care Note  Patient: Desiree Mccall  Procedure(s) Performed: CATARACT EXTRACTION PHACO AND INTRAOCULAR LENS PLACEMENT (IOC) RIGHT DIABETIC  9.39  00:59.6 (Right: Eye)  Patient Location: PACU  Anesthesia Type:MAC  Level of Consciousness: awake, alert , and oriented  Airway & Oxygen Therapy: Patient Spontanous Breathing  Post-op Assessment: Report given to RN  Post vital signs: Reviewed and stable  Last Vitals:  Vitals Value Taken Time  BP 151/81 10/13/22 1225  Temp 36.4 C 10/13/22 1225  Pulse 54 10/13/22 1226  Resp 14 10/13/22 1226  SpO2 93 % 10/13/22 1226  Vitals shown include unvalidated device data.  Last Pain:  Vitals:   10/13/22 1225  TempSrc:   PainSc: 0-No pain         Complications: No notable events documented.

## 2022-10-13 NOTE — H&P (Signed)
[facility]   Primary Care Physician:  Abram Sander, MD Ophthalmologist: Dr. Deberah Pelton  Pre-Procedure History & Physical: HPI:  Desiree Mccall is a 72 y.o. female here for cataract surgery.   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    AICD (automatic cardioverter/defibrillator) present    Aortic dissection (HCC) 05/02/2008   Cardiac arrest (HCC) 05/2018   CHF (congestive heart failure) (HCC)    COVID-19 03/2019   Diabetes mellitus without complication (HCC)    [name]    Heart attack (HCC) 01/24/2009   Left atrial dilation    Mild mitral regurgitation by prior echocardiogram    Mild pulmonary arterial systolic hypertension (HCC)    Moderate tricuspid regurgitation by prior echocardiogram    Presence of permanent cardiac pacemaker    Right atrial dilation    Sleep apnea    CPAP   Stroke (HCC)    Several "light strokes" in past.  No deficits   Wears dentures    full upper    Past Surgical History:  Procedure Laterality Date   ABDOMINAL HYSTERECTOMY     CARDIAC CATHETERIZATION     CARDIAC SURGERY     CAROTID STENT  05/02/2008   CHOLECYSTECTOMY     INSERT / REPLACE / REMOVE PACEMAKER     Original 2011.  Generator replaced 2019   LEFT HEART CATH AND CORONARY ANGIOGRAPHY N/A 06/05/2018   Procedure: LEFT HEART CATH AND CORONARY ANGIOGRAPHY;  Surgeon: Laurier Nancy, MD;  Location: [facility] INVASIVE CV LAB;  Service: Cardiovascular;  Laterality: N/A;   STENT PLACE LEFT URETER ([facility] HX)      Prior to Admission medications   Medication Sig Start Date End Date Taking? Authorizing Provider  atorvastatin (LIPITOR) 40 MG tablet Take 1 tablet (40 mg total) by mouth daily at 6 PM. 06/06/18  Yes Judithe Modest, NP  cetirizine (ZYRTEC) 10 MG chewable tablet Chew 10 mg by mouth daily.   Yes [provider]  clobetasol cream (TEMOVATE) 0.05 % Apply 1 Application topically daily.   Yes [provider]  clopidogrel (PLAVIX) 75 MG tablet Take 75 mg by mouth daily.   Yes [provider]  fluticasone (FLONASE) 50 MCG/ACT nasal spray Place 1 spray into both nostrils daily.   Yes [provider]  furosemide (LASIX) 40 MG tablet Take 1 tablet (40 mg total) by mouth daily. 04/07/19  Yes Leroy Sea, MD  insulin glargine (LANTUS) 100 UNIT/ML injection Inject 55 Units into the skin daily. Takes mid-day   Yes [provider]  losartan (COZAAR) 100 MG tablet Take 100 mg by mouth daily. 09/25/19  Yes [provider]  meclizine (ANTIVERT) 25 MG tablet Take 1 tablet (25 mg total) by mouth 3 (three) times daily as needed for dizziness. 09/03/21  Yes Ward, Layla Maw, DO  metFORMIN (GLUCOPHAGE) 1000 MG tablet Take 1 tablet (1,000 mg total) by mouth daily with breakfast. 04/04/19 10/13/22 Yes Ghimire, Werner Lean, MD  metoprolol succinate (TOPROL-XL) 100 MG 24 hr tablet Take 100 mg by mouth daily. 10/22/18  Yes [provider]  Multiple Vitamin (MULTIVITAMIN WITH MINERALS) TABS tablet Take 1 tablet by mouth daily.   Yes [provider]  ranolazine (RANEXA) 500 MG 12 hr tablet Take 500 mg by mouth daily.   Yes [provider]  rivaroxaban (XARELTO) 20 MG TABS tablet Take 20 mg by mouth daily with supper.   Yes  [provider]  sertraline (ZOLOFT) 100 MG tablet Take 1 tablet (100 mg total) by mouth daily. 06/24/22 10/10/22 Yes [name], MD  spironolactone (ALDACTONE) 25 MG tablet Take 1 tablet (25 mg total) by mouth daily. 04/07/19  Yes Leroy Sea, MD  sertraline (ZOLOFT) 100 MG tablet Take 1.5 tablets (150 mg total) by mouth daily. 08/31/22 11/29/22  [name], MD    Allergies as of 08/18/2022   ([name])    Family History  Problem Relation Age of Onset   Depression Mother    Heart failure Mother    Depression Father    Anxiety disorder Father    Heart failure Father    Depression Sister    Anxiety disorder  Sister    Alcohol abuse Brother     Social History   Socioeconomic History   Marital status: Single    Spouse name: Not on file   Number of children: Not on file   Years of education: Not on file   Highest education level: Not on file  Occupational History   Not on file  Tobacco Use   Smoking status: Every Day    Packs/day: 1    Types: Cigarettes    Start date: 2012   Smokeless tobacco: Never   Tobacco comments:    Started smoking age 15.  Has quit several times and restarted  Vaping Use   Vaping Use: Never used  Substance and Sexual Activity   Alcohol use: Not Currently    Comment: occasionally   Drug use: No   Sexual activity: Not Currently  Other Topics Concern   Not on file  Social History Narrative   Not on file   Social Determinants of Health   Financial Resource Strain: Not on file  Food Insecurity: Not on file  Transportation Needs: Not on file  Physical Activity: Not on file  Stress: Not on file  Social Connections: Not on file  Intimate Partner Violence: Not on file    Review of Systems: See HPI, otherwise negative ROS  Physical Exam: BP (!) 149/74   Temp (!) 97.4 F (36.3 C) (Temporal)   Resp 16   Ht 5' 7.01" (1.702 m)   Wt 98.4 kg   SpO2 96%   BMI 33.98 kg/m  General:   Alert, cooperative in NAD Head:  Normocephalic and atraumatic. Respiratory:  Normal work of breathing. Cardiovascular:  RRR  Impression/Plan: Desiree Mccall is here for cataract surgery.  Risks, benefits, limitations, and alternatives regarding cataract surgery have been reviewed with the patient.  Questions have been answered.  All parties agreeable.   Estanislado Pandy, MD  10/13/2022, 11:38 AM

## 2022-10-14 ENCOUNTER — Encounter: Payer: Self-pay | Admitting: Ophthalmology

## 2022-10-17 NOTE — Anesthesia Preprocedure Evaluation (Addendum)
Anesthesia Evaluation  Patient identified by MRN, date of birth, ID band Patient awake    Reviewed: Allergy & Precautions, H&P , NPO status , Patient's Chart, lab work & pertinent test results  Airway Mallampati: I  TM Distance: >3 FB Neck ROM: Full   Comment:   Comment: Patient has Mallampatti I airway, but very short TMD, so would expect possible difficult anterior airway if intubation were needed.   Dental no notable dental hx. (+) Upper Dentures   Pulmonary neg pulmonary ROS, sleep apnea , Current Smoker She is trying very hard to quit smoking, smokes about 1 PPD now, on program [facility] to help with this.   Pulmonary exam normal breath sounds clear to auscultation       Cardiovascular + CAD, + Past MI, + Peripheral Vascular Disease and +CHF  negative cardio ROS Normal cardiovascular exam+ pacemaker + Cardiac Defibrillator  Rhythm:Regular Rate:Normal  03-01-19 1. Left ventricular ejection fraction, by visual estimation, is 20 to  25%. The left ventricle has severely decreased function. Left ventricular  septal wall thickness was normal. Normal left ventricular posterior wall  thickness. There is no left  ventricular hypertrophy.   2. Moderate to large anteroseptal, apical, anteroapical, anterior and  lateral myocardial infarction.   3. Severely dilated left ventricular internal cavity size.   4. Global right ventricle has moderately reduced systolic function.The  right ventricular size is severely enlarged. No increase in right  ventricular wall thickness.   5. Left atrial size was mildly dilated.   6. Right atrial size was mildly dilated.   7. The mitral valve is abnormal. Mild mitral valve regurgitation.   8. The tricuspid valve is grossly normal. Tricuspid valve regurgitation  mild-moderate.   9. The aortic valve is normal in structure. Aortic valve regurgitation is  trivial. Mild to moderate aortic valve  sclerosis/calcification without any  evidence of aortic stenosis.  10. The pulmonic valve was grossly normal. Pulmonic valve regurgitation is  not visualized.  11. Mildly elevated pulmonary artery systolic pressure.  12. Severe ant/Apical/Septal/Lateral Hypo.  13. Mod to severe LVE.    Cardiac arrest 2020 Pacemaker/AICD evaluation April 2024    Neuro/Psych CVA negative neurological ROS  negative psych ROS   GI/Hepatic negative GI ROS, Neg liver ROS,,,  Endo/Other  negative endocrine ROSdiabetes, Poorly Controlled    Renal/GU negative Renal ROS  negative genitourinary   Musculoskeletal negative musculoskeletal ROS (+)    Abdominal   Peds negative pediatric ROS (+)  Hematology negative hematology ROS (+)   Anesthesia Other Findings Previous cataract surgery 10-13-22  Blood sugar checked twice today, 460, patient reports feeling thirsty and urinating "a lot," fatigue. Is awake, alert and oriented x 3. Discussed risks of blood sugars this high. Urged patient to seek treatment at ER, and patient responded that she will go now. Patient very interested in controlling blood sugar. 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  Left atrial dilation Right atrial dilation  Mild pulmonary arterial systolic hypertension (HCC)     Reproductive/Obstetrics  negative OB ROS                             Anesthesia  Physical Anesthesia Plan  ASA: 4  Anesthesia Plan: MAC   Post-op Pain Management:    Induction: Intravenous  PONV Risk Score and Plan:   Airway Management Planned: Natural Airway and Nasal Cannula  Additional Equipment:   Intra-op Plan:   Post-operative Plan:   Informed Consent: I have reviewed the patients History and Physical, chart, labs and discussed the procedure including the risks, benefits and alternatives for the proposed anesthesia with the patient or authorized representative who has indicated his/her understanding and acceptance.     Dental Advisory Given  Plan Discussed with: Anesthesiologist, CRNA and Surgeon  Anesthesia Plan Comments: (Patient consented for risks of anesthesia including but not limited to:  - adverse reactions to medications - damage to eyes, teeth, lips or other oral mucosa - nerve damage due to positioning  - sore throat or hoarseness - Damage to heart, brain, nerves, lungs, other parts of body or loss of life  Patient voiced understanding.)       Anesthesia Quick Evaluation

## 2022-10-25 NOTE — Discharge Instructions (Signed)

## 2022-10-26 ENCOUNTER — Encounter: Payer: Self-pay | Admitting: Psychiatry

## 2022-10-26 ENCOUNTER — Telehealth (INDEPENDENT_AMBULATORY_CARE_PROVIDER_SITE_OTHER): Payer: Medicare PPO | Admitting: Psychiatry

## 2022-10-26 DIAGNOSIS — F411 Generalized anxiety disorder: Secondary | ICD-10-CM

## 2022-10-26 DIAGNOSIS — F3341 Major depressive disorder, recurrent, in partial remission: Secondary | ICD-10-CM

## 2022-10-26 DIAGNOSIS — F4310 Post-traumatic stress disorder, unspecified: Secondary | ICD-10-CM

## 2022-10-26 MED ORDER — SERTRALINE HCL 100 MG PO TABS: 200.0000 mg | ORAL_TABLET | Freq: Every day | ORAL | 0 refills | Status: DC
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
## 2022-10-27 ENCOUNTER — Encounter: Payer: Self-pay | Admitting: Ophthalmology

## 2022-10-27 ENCOUNTER — Encounter: Payer: Self-pay | Admitting: Emergency Medicine

## 2022-10-27 ENCOUNTER — Other Ambulatory Visit: Payer: Self-pay

## 2022-10-27 ENCOUNTER — Encounter
Admission: RE | Disposition: A | Discharge status: Home / Self Care | Payer: Self-pay | Source: Ambulatory Visit | Attending: Ophthalmology

## 2022-10-27 ENCOUNTER — Ambulatory Visit: Payer: Medicare PPO | Admitting: Anesthesiology

## 2022-10-27 ENCOUNTER — Emergency Department
Admission: EM | Admit: 2022-10-27 | Discharge: 2022-10-27 | Disposition: A | Discharge status: Home / Self Care | Payer: Medicare PPO | Source: Home / Self Care

## 2022-10-27 ENCOUNTER — Ambulatory Visit
Admission: RE | Admit: 2022-10-27 | Discharge: 2022-10-27 | Disposition: A | Discharge status: Home / Self Care | Payer: Medicare PPO | Source: Ambulatory Visit | Attending: Ophthalmology | Admitting: Ophthalmology

## 2022-10-27 ENCOUNTER — Emergency Department
Admission: EM | Admit: 2022-10-27 | Discharge: 2022-10-27 | Disposition: A | Discharge status: Home / Self Care | Payer: Medicare PPO | Source: Home / Self Care | Attending: Emergency Medicine | Admitting: Emergency Medicine

## 2022-10-27 DIAGNOSIS — I509 Heart failure, unspecified: Secondary | ICD-10-CM | POA: Insufficient documentation

## 2022-10-27 DIAGNOSIS — F17200 Nicotine dependence, unspecified, uncomplicated: Secondary | ICD-10-CM | POA: Insufficient documentation

## 2022-10-27 DIAGNOSIS — G4730 Sleep apnea, unspecified: Secondary | ICD-10-CM | POA: Diagnosis not present

## 2022-10-27 DIAGNOSIS — E785 Hyperlipidemia, unspecified: Secondary | ICD-10-CM | POA: Diagnosis not present

## 2022-10-27 DIAGNOSIS — H269 Unspecified cataract: Secondary | ICD-10-CM | POA: Insufficient documentation

## 2022-10-27 DIAGNOSIS — E1136 Type 2 diabetes mellitus with diabetic cataract: Secondary | ICD-10-CM | POA: Diagnosis not present

## 2022-10-27 DIAGNOSIS — Z950 Presence of cardiac pacemaker: Secondary | ICD-10-CM | POA: Diagnosis not present

## 2022-10-27 DIAGNOSIS — E1165 Type 2 diabetes mellitus with hyperglycemia: Secondary | ICD-10-CM | POA: Insufficient documentation

## 2022-10-27 DIAGNOSIS — R739 Hyperglycemia, unspecified: Secondary | ICD-10-CM

## 2022-10-27 DIAGNOSIS — I252 Old myocardial infarction: Secondary | ICD-10-CM | POA: Diagnosis not present

## 2022-10-27 DIAGNOSIS — Z539 Procedure and treatment not carried out, unspecified reason: Secondary | ICD-10-CM | POA: Insufficient documentation

## 2022-10-27 DIAGNOSIS — Z5321 Procedure and treatment not carried out due to patient leaving prior to being seen by health care provider: Secondary | ICD-10-CM | POA: Insufficient documentation

## 2022-10-27 DIAGNOSIS — Z4800 Encounter for change or removal of nonsurgical wound dressing: Secondary | ICD-10-CM | POA: Insufficient documentation

## 2022-10-27 DIAGNOSIS — I083 Combined rheumatic disorders of mitral, aortic and tricuspid valves: Secondary | ICD-10-CM | POA: Diagnosis not present

## 2022-10-27 DIAGNOSIS — I2510 Atherosclerotic heart disease of native coronary artery without angina pectoris: Secondary | ICD-10-CM | POA: Insufficient documentation

## 2022-10-27 LAB — URINALYSIS, ROUTINE W REFLEX MICROSCOPIC
Bacteria, UA: NONE SEEN
Bilirubin Urine: NEGATIVE
Glucose, UA: >=500 mg/dL — AB
Hgb urine dipstick: NEGATIVE
Ketones, ur: NEGATIVE mg/dL
Leukocytes,Ua: NEGATIVE
Nitrite: NEGATIVE
Protein, ur: NEGATIVE mg/dL
Specific Gravity, Urine: 1.02 (ref 1.005–1.030)
pH: 5 (ref 5.0–8.0)

## 2022-10-27 LAB — CBG MONITORING, ED
Glucose-Capillary: 322 mg/dL — ABNORMAL HIGH (ref 70–99)
Glucose-Capillary: 113 mg/dL — ABNORMAL HIGH (ref 70–99)

## 2022-10-27 LAB — BASIC METABOLIC PANEL
Anion gap: 8 (ref 5–15)
BUN: 14 mg/dL (ref 8–23)
CO2: 23 mmol/L (ref 22–32)
Calcium: 9 mg/dL (ref 8.9–10.3)
Chloride: 103 mmol/L (ref 98–111)
Creatinine, Ser: 0.82 mg/dL (ref 0.44–1.00)
GFR, Estimated: >60 mL/min (ref >60)
Glucose, Bld: 353 mg/dL — ABNORMAL HIGH (ref 70–99)
Potassium: 4.8 mmol/L (ref 3.5–5.1)
Sodium: 134 mmol/L — ABNORMAL LOW (ref 135–145)

## 2022-10-27 LAB — GLUCOSE, CAPILLARY
Glucose-Capillary: 435 mg/dL — ABNORMAL HIGH (ref 70–99)
Glucose-Capillary: 460 mg/dL — ABNORMAL HIGH (ref 70–99)

## 2022-10-27 LAB — CBC
HCT: 36.4 % (ref 36.0–46.0)
Hemoglobin: 11.9 g/dL — ABNORMAL LOW (ref 12.0–15.0)
MCH: 29.3 pg (ref 26.0–34.0)
MCHC: 32.7 g/dL (ref 30.0–36.0)
MCV: 89.7 fL (ref 80.0–100.0)
Platelets: 237 10*3/uL (ref 150–400)
RBC: 4.06 MIL/uL (ref 3.87–5.11)
RDW: 13.7 % (ref 11.5–15.5)
WBC: 6.3 10*3/uL (ref 4.0–10.5)
nRBC: 0 % (ref 0.0–0.2)

## 2022-10-27 SURGERY — PHACOEMULSIFICATION, CATARACT, WITH IOL INSERTION
Anesthesia: Monitor Anesthesia Care | Laterality: Left

## 2022-10-27 MED ORDER — SODIUM CHLORIDE 0.9 % IV BOLUS: 1000.0000 mL | Freq: Once | INTRAVENOUS | Status: DC

## 2022-10-27 MED ORDER — INSULIN ASPART 100 UNIT/ML IJ SOLN
5.0000 [IU] | Freq: Once | INTRAMUSCULAR | Status: AC
  Administered 2022-10-27: 5 [IU] via INTRAVENOUS
  Filled 2022-10-27: qty 1

## 2022-10-27 MED ORDER — ARMC OPHTHALMIC DILATING DROPS: 1.0000 | OPHTHALMIC | Status: DC | PRN

## 2022-10-27 MED ORDER — TETRACAINE HCL 0.5 % OP SOLN
1.0000 [drp] | OPHTHALMIC | Status: DC | PRN
  Administered 2022-10-27: 1 [drp] via OPHTHALMIC

## 2022-10-27 SURGICAL SUPPLY — 14 items
BNDG EYE OVAL 2 1/8 X 2 5/8 (GAUZE/BANDAGES/DRESSINGS) IMPLANT
CANNULA ANT/CHMB 27GA (MISCELLANEOUS) IMPLANT
CATARACT SUITE SIGHTPATH (MISCELLANEOUS) ×1 IMPLANT
DISSECTOR HYDRO NUCLEUS 50X22 (MISCELLANEOUS) ×1 IMPLANT
DRSG TEGADERM 2-3/8X2-3/4 SM (GAUZE/BANDAGES/DRESSINGS) ×1 IMPLANT
GLOVE SURG SYN 7.5 E (GLOVE) ×1 IMPLANT
GLOVE SURG SYN 8.5 E (GLOVE) ×1 IMPLANT
NEEDLE FILTER BLUNT 18X1 1/2 (NEEDLE) IMPLANT
PACK VIT ANT 23G (MISCELLANEOUS) IMPLANT
RING MALYGIN 7.0 (MISCELLANEOUS) IMPLANT
SUT ETHILON 10-0 CS-B-6CS-B-6 (SUTURE)
SUTURE EHLN 10-0 CS-B-6CS-B-6 (SUTURE) IMPLANT
SYR 3ML LL SCALE MARK (SYRINGE) IMPLANT
SYR 5ML LL (SYRINGE) IMPLANT

## 2022-10-27 NOTE — ED Notes (Signed)
Patient just wanted bandage changed on hand were IV was taken out. New bandage placed by this RN. Patient not wanting to see MD.

## 2022-10-27 NOTE — ED Notes (Signed)
See triage note  Presents with elevated glucose   States she was going to have cataracts surgery  and the staff noted that her glucose was up

## 2022-10-27 NOTE — Progress Notes (Signed)
Pt cancelled per anesthesia  

## 2022-10-27 NOTE — ED Triage Notes (Signed)
Patient to ED via POV for hyperglycemia. States she was suppose to have eye surgery this AM but blood sugar was 460 and was told to come to ED. States she has been feeling tired more than normal for the past week.

## 2022-10-27 NOTE — ED Provider Notes (Signed)
[facility] Provider Note    Event Date/Time   First MD Initiated Contact with Patient 10/27/22 0800     (approximate)   History   Chief Complaint Hyperglycemia   HPI  Desiree Mccall is a 72 y.o. female with past medical history of hyperlipidemia, diabetes, CAD, CHF, PAD, and aortic dissection status postrepair who presents to the ED complaining of hyperglycemia.  Patient reports that she presented for cataract surgery this morning, but was told that the surgery could not be done because of elevated blood sugar.  She reports that her blood sugar was 460 this morning and has been running high throughout the week.  She reports associated fatigue but has not had any fevers, cough, chest pain, shortness of breath, nausea, vomiting, or diarrhea.  She does report some burning in her pelvic area, denies any abdominal pain or flank pain.  She does admit that she has been dealing with a lot of stress lately due to issues with her home, has missed some doses of her insulin and metformin.     Physical Exam   Triage Vital Signs: ED Triage Vitals  Enc Vitals Group     BP 10/27/22 0749 (!) 147/81     Pulse Rate 10/27/22 0749 61     Resp 10/27/22 0749 18     Temp 10/27/22 0749 98.4 F (36.9 C)     Temp Source 10/27/22 0749 Oral     SpO2 10/27/22 0749 95 %     Weight 10/27/22 0748 215 lb (97.5 kg)     Height 10/27/22 0748 5\' 7"  (1.702 m)     Head Circumference --      Peak Flow --      Pain Score 10/27/22 0748 0     Pain Loc --      Pain Edu? --      Excl. in GC? --     Most recent vital signs: Vitals:   10/27/22 0749  BP: (!) 147/81  Pulse: 61  Resp: 18  Temp: 98.4 F (36.9 C)  SpO2: 95%    Constitutional: Alert and oriented. Eyes: Conjunctivae are normal. Head: Atraumatic. Nose: No congestion/rhinnorhea. Mouth/Throat: Mucous membranes are moist.  Cardiovascular: Normal rate, regular rhythm. Grossly normal heart sounds.  2+ radial pulses  bilaterally. Respiratory: Normal respiratory effort.  No retractions. Lungs CTAB. Gastrointestinal: Soft and nontender. No distention. Musculoskeletal: No lower extremity tenderness nor edema.  Neurologic:  Normal speech and language. No gross focal neurologic deficits are appreciated.    ED Results / Procedures / Treatments   Labs (all labs ordered are listed, but only abnormal results are displayed) Labs Reviewed  BASIC METABOLIC PANEL - Abnormal; Notable for the following components:      Result Value   Sodium 134 (*)    Glucose, Bld 353 (*)    All other components within normal limits  CBC - Abnormal; Notable for the following components:   Hemoglobin 11.9 (*)    All other components within normal limits  URINALYSIS, ROUTINE W REFLEX MICROSCOPIC - Abnormal; Notable for the following components:   Color, Urine YELLOW (*)    APPearance CLEAR (*)    Glucose, UA >=500 (*)    All other components within normal limits  CBG MONITORING, ED - Abnormal; Notable for the following components:   Glucose-Capillary 322 (*)    All other components within normal limits  CBG MONITORING, ED - Abnormal; Notable for the following components:   Glucose-Capillary 113 (*)  All other components within normal limits    PROCEDURES:  Critical Care performed: No  Procedures   MEDICATIONS ORDERED IN ED: Medications  insulin aspart (novoLOG) injection 5 Units (5 Units Intravenous Given 10/27/22 0858)     IMPRESSION / MDM / ASSESSMENT AND PLAN / ED COURSE  I reviewed the triage vital signs and the nursing notes.                              72 y.o. female with past medical history of hyperlipidemia, diabetes, CAD, CHF, PAD, and aortic dissection status postrepair who presents to the ED with hyperglycemia and fatigue.  Patient's presentation is most consistent with acute presentation with potential threat to life or bodily function.  Differential diagnosis includes, but is not limited to,  hyperglycemia, DKA, AKI, electrolyte abnormality, anemia, UTI, medication noncompliance.  Patient well-appearing and in no acute distress, vital signs are unremarkable.  Labs show hyperglycemia without evidence of DKA, AKI, or electrolyte abnormality.  No significant anemia or leukocytosis noted.  Patient does report intermittent compliance with her diabetic regimen, which is likely the cause of her hyperglycemia.  We will avoid IV fluids given her known low EF, will give a dose of IV insulin.  She does endorse some dysuria and urinalysis pending at this time, no findings concerning for pyelonephritis or sepsis.  Urinalysis shows no signs of infection, blood glucose improved on recheck and suspect hyperglycemia is due to intermittent compliance with her regimen.  Patient is appropriate for discharge home with PCP follow-up, she was counseled to return to the ED for new or worsening symptoms.  Patient agrees with plan.      FINAL CLINICAL IMPRESSION(S) / ED DIAGNOSES   Final diagnoses:  Hyperglycemia     Rx / DC Orders   ED Discharge Orders     None        Note:  This document was prepared using Dragon voice recognition software and may include unintentional dictation errors.   Chesley Noon, MD 10/27/22 1013

## 2022-11-16 ENCOUNTER — Telehealth: Payer: Medicare PPO | Admitting: Psychiatry
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
## 2022-12-21 ENCOUNTER — Encounter: Payer: Self-pay | Admitting: Psychiatry

## 2022-12-21 ENCOUNTER — Telehealth: Payer: Medicare PPO | Admitting: Psychiatry

## 2022-12-21 ENCOUNTER — Telehealth (INDEPENDENT_AMBULATORY_CARE_PROVIDER_SITE_OTHER): Payer: Medicare HMO | Admitting: Psychiatry

## 2022-12-21 DIAGNOSIS — F411 Generalized anxiety disorder: Secondary | ICD-10-CM | POA: Diagnosis not present

## 2022-12-21 DIAGNOSIS — F3341 Major depressive disorder, recurrent, in partial remission: Secondary | ICD-10-CM

## 2022-12-21 DIAGNOSIS — F4310 Post-traumatic stress disorder, unspecified: Secondary | ICD-10-CM | POA: Diagnosis not present

## 2022-12-21 MED ORDER — SERTRALINE HCL 100 MG PO TABS: 200.0000 mg | ORAL_TABLET | Freq: Every day | ORAL | 0 refills | Status: DC

## 2022-12-21 NOTE — Patient Instructions (Signed)
Continue sertraline 200 mg at night   Next appointment: 8/21 at 1 40

## 2022-12-26 ENCOUNTER — Other Ambulatory Visit: Payer: Self-pay | Admitting: Psychiatry

## 2022-12-30 ENCOUNTER — Other Ambulatory Visit: Payer: Self-pay | Admitting: Family Medicine

## 2022-12-30 DIAGNOSIS — Z1231 Encounter for screening mammogram for malignant neoplasm of breast: Secondary | ICD-10-CM

## 2022-12-30 DIAGNOSIS — Z780 Asymptomatic menopausal state: Secondary | ICD-10-CM

## 2023-01-12 ENCOUNTER — Other Ambulatory Visit: Payer: Self-pay

## 2023-01-12 ENCOUNTER — Emergency Department: Payer: Medicare HMO

## 2023-01-12 ENCOUNTER — Inpatient Hospital Stay
Admission: EM | Admit: 2023-01-12 | Discharge: 2023-01-15 | DRG: 871 | Disposition: A | Discharge status: Home / Self Care | Payer: Medicare HMO | Attending: Student | Admitting: Student

## 2023-01-12 DIAGNOSIS — E669 Obesity, unspecified: Secondary | ICD-10-CM | POA: Diagnosis present

## 2023-01-12 DIAGNOSIS — Z7902 Long term (current) use of antithrombotics/antiplatelets: Secondary | ICD-10-CM

## 2023-01-12 DIAGNOSIS — I110 Hypertensive heart disease with heart failure: Secondary | ICD-10-CM | POA: Diagnosis present

## 2023-01-12 DIAGNOSIS — I739 Peripheral vascular disease, unspecified: Secondary | ICD-10-CM | POA: Diagnosis present

## 2023-01-12 DIAGNOSIS — Z23 Encounter for immunization: Secondary | ICD-10-CM

## 2023-01-12 DIAGNOSIS — R6520 Severe sepsis without septic shock: Secondary | ICD-10-CM | POA: Diagnosis present

## 2023-01-12 DIAGNOSIS — G4733 Obstructive sleep apnea (adult) (pediatric): Secondary | ICD-10-CM

## 2023-01-12 DIAGNOSIS — Z86718 Personal history of other venous thrombosis and embolism: Secondary | ICD-10-CM

## 2023-01-12 DIAGNOSIS — E1151 Type 2 diabetes mellitus with diabetic peripheral angiopathy without gangrene: Secondary | ICD-10-CM | POA: Diagnosis present

## 2023-01-12 DIAGNOSIS — Z7901 Long term (current) use of anticoagulants: Secondary | ICD-10-CM

## 2023-01-12 DIAGNOSIS — S51851A Open bite of right forearm, initial encounter: Secondary | ICD-10-CM | POA: Diagnosis not present

## 2023-01-12 DIAGNOSIS — I2510 Atherosclerotic heart disease of native coronary artery without angina pectoris: Secondary | ICD-10-CM | POA: Diagnosis present

## 2023-01-12 DIAGNOSIS — A419 Sepsis, unspecified organism: Principal | ICD-10-CM | POA: Diagnosis present

## 2023-01-12 DIAGNOSIS — E1165 Type 2 diabetes mellitus with hyperglycemia: Secondary | ICD-10-CM | POA: Diagnosis present

## 2023-01-12 DIAGNOSIS — L03113 Cellulitis of right upper limb: Secondary | ICD-10-CM | POA: Diagnosis present

## 2023-01-12 DIAGNOSIS — E872 Acidosis, unspecified: Secondary | ICD-10-CM | POA: Diagnosis present

## 2023-01-12 DIAGNOSIS — I5022 Chronic systolic (congestive) heart failure: Secondary | ICD-10-CM | POA: Diagnosis present

## 2023-01-12 DIAGNOSIS — L03114 Cellulitis of left upper limb: Secondary | ICD-10-CM | POA: Diagnosis present

## 2023-01-12 DIAGNOSIS — N170 Acute kidney failure with tubular necrosis: Secondary | ICD-10-CM | POA: Diagnosis present

## 2023-01-12 DIAGNOSIS — Z8616 Personal history of COVID-19: Secondary | ICD-10-CM

## 2023-01-12 DIAGNOSIS — Z95810 Presence of automatic (implantable) cardiac defibrillator: Secondary | ICD-10-CM | POA: Diagnosis not present

## 2023-01-12 DIAGNOSIS — Z8249 Family history of ischemic heart disease and other diseases of the circulatory system: Secondary | ICD-10-CM

## 2023-01-12 DIAGNOSIS — Z6835 Body mass index (BMI) 35.0-35.9, adult: Secondary | ICD-10-CM | POA: Diagnosis not present

## 2023-01-12 DIAGNOSIS — Z8673 Personal history of transient ischemic attack (TIA), and cerebral infarction without residual deficits: Secondary | ICD-10-CM

## 2023-01-12 DIAGNOSIS — Z90710 Acquired absence of both cervix and uterus: Secondary | ICD-10-CM

## 2023-01-12 DIAGNOSIS — W5501XA Bitten by cat, initial encounter: Secondary | ICD-10-CM

## 2023-01-12 DIAGNOSIS — Z8679 Personal history of other diseases of the circulatory system: Secondary | ICD-10-CM

## 2023-01-12 DIAGNOSIS — N179 Acute kidney failure, unspecified: Secondary | ICD-10-CM

## 2023-01-12 DIAGNOSIS — Z7984 Long term (current) use of oral hypoglycemic drugs: Secondary | ICD-10-CM

## 2023-01-12 DIAGNOSIS — Z794 Long term (current) use of insulin: Secondary | ICD-10-CM

## 2023-01-12 DIAGNOSIS — I252 Old myocardial infarction: Secondary | ICD-10-CM

## 2023-01-12 DIAGNOSIS — Z811 Family history of alcohol abuse and dependence: Secondary | ICD-10-CM

## 2023-01-12 DIAGNOSIS — F17210 Nicotine dependence, cigarettes, uncomplicated: Secondary | ICD-10-CM | POA: Diagnosis present

## 2023-01-12 DIAGNOSIS — Z5986 Financial insecurity: Secondary | ICD-10-CM

## 2023-01-12 DIAGNOSIS — Z818 Family history of other mental and behavioral disorders: Secondary | ICD-10-CM

## 2023-01-12 DIAGNOSIS — L089 Local infection of the skin and subcutaneous tissue, unspecified: Secondary | ICD-10-CM | POA: Diagnosis present

## 2023-01-12 DIAGNOSIS — Z79899 Other long term (current) drug therapy: Secondary | ICD-10-CM

## 2023-01-12 DIAGNOSIS — Z9861 Coronary angioplasty status: Secondary | ICD-10-CM

## 2023-01-12 DIAGNOSIS — Z8674 Personal history of sudden cardiac arrest: Secondary | ICD-10-CM

## 2023-01-12 DIAGNOSIS — I959 Hypotension, unspecified: Secondary | ICD-10-CM

## 2023-01-12 LAB — BRAIN NATRIURETIC PEPTIDE: B Natriuretic Peptide: 191 pg/mL — ABNORMAL HIGH (ref 0.0–100.0)

## 2023-01-12 LAB — COMPREHENSIVE METABOLIC PANEL
ALT: 40 U/L (ref 0–44)
AST: 61 U/L — ABNORMAL HIGH (ref 15–41)
Albumin: 3.3 g/dL — ABNORMAL LOW (ref 3.5–5.0)
Alkaline Phosphatase: 173 U/L — ABNORMAL HIGH (ref 38–126)
Anion gap: 13 (ref 5–15)
BUN: 23 mg/dL (ref 8–23)
CO2: 21 mmol/L — ABNORMAL LOW (ref 22–32)
Calcium: 8.3 mg/dL — ABNORMAL LOW (ref 8.9–10.3)
Chloride: 101 mmol/L (ref 98–111)
Creatinine, Ser: 1.15 mg/dL — ABNORMAL HIGH (ref 0.44–1.00)
GFR, Estimated: 51 mL/min — ABNORMAL LOW (ref >60)
Glucose, Bld: 390 mg/dL — ABNORMAL HIGH (ref 70–99)
Potassium: 4.2 mmol/L (ref 3.5–5.1)
Sodium: 135 mmol/L (ref 135–145)
Total Bilirubin: 0.9 mg/dL (ref 0.3–1.2)
Total Protein: 6.7 g/dL (ref 6.5–8.1)

## 2023-01-12 LAB — CBC WITH DIFFERENTIAL/PLATELET
Abs Immature Granulocytes: 0.04 10*3/uL (ref 0.00–0.07)
Basophils Absolute: 0 10*3/uL (ref 0.0–0.1)
Basophils Relative: 0 %
Eosinophils Absolute: 0 10*3/uL (ref 0.0–0.5)
Eosinophils Relative: 0 %
HCT: 34.8 % — ABNORMAL LOW (ref 36.0–46.0)
Hemoglobin: 11.4 g/dL — ABNORMAL LOW (ref 12.0–15.0)
Immature Granulocytes: 1 %
Lymphocytes Relative: 5 %
Lymphs Abs: 0.2 10*3/uL — ABNORMAL LOW (ref 0.7–4.0)
MCH: 29.2 pg (ref 26.0–34.0)
MCHC: 32.8 g/dL (ref 30.0–36.0)
MCV: 89 fL (ref 80.0–100.0)
Monocytes Absolute: 0 10*3/uL — ABNORMAL LOW (ref 0.1–1.0)
Monocytes Relative: 0 %
Neutro Abs: 4.6 10*3/uL (ref 1.7–7.7)
Neutrophils Relative %: 94 %
Platelets: 139 10*3/uL — ABNORMAL LOW (ref 150–400)
RBC: 3.91 MIL/uL (ref 3.87–5.11)
RDW: 13.8 % (ref 11.5–15.5)
WBC: 4.9 10*3/uL (ref 4.0–10.5)
nRBC: 0 % (ref 0.0–0.2)

## 2023-01-12 LAB — LACTIC ACID, PLASMA
Lactic Acid, Venous: 2.5 mmol/L (ref 0.5–1.9)
Lactic Acid, Venous: 1.3 mmol/L (ref 0.5–1.9)

## 2023-01-12 LAB — APTT: aPTT: 30 s (ref 24–36)

## 2023-01-12 LAB — PROTIME-INR
INR: 1.5 — ABNORMAL HIGH (ref 0.8–1.2)
Prothrombin Time: 18.7 s — ABNORMAL HIGH (ref 11.4–15.2)

## 2023-01-12 MED ORDER — LACTATED RINGERS IV SOLN
150.0000 mL/h | INTRAVENOUS | Status: DC
  Administered 2023-01-13 (×3): 150 mL/h via INTRAVENOUS

## 2023-01-12 MED ORDER — ACETAMINOPHEN 325 MG PO TABS
650.0000 mg | ORAL_TABLET | Freq: Once | ORAL | Status: AC
  Administered 2023-01-12: 650 mg via ORAL
  Filled 2023-01-12: qty 2

## 2023-01-12 MED ORDER — RIVAROXABAN 20 MG PO TABS
20.0000 mg | ORAL_TABLET | Freq: Every day | ORAL | Status: DC
  Administered 2023-01-13 – 2023-01-15 (×3): 20 mg via ORAL
  Filled 2023-01-12 (×4): qty 1

## 2023-01-12 MED ORDER — RANOLAZINE ER 500 MG PO TB12
500.0000 mg | ORAL_TABLET | Freq: Every day | ORAL | Status: DC
  Administered 2023-01-13 – 2023-01-15 (×3): 500 mg via ORAL
  Filled 2023-01-12 (×3): qty 1

## 2023-01-12 MED ORDER — KETOROLAC TROMETHAMINE 15 MG/ML IJ SOLN: 15.0000 mg | Freq: Four times a day (QID) | INTRAMUSCULAR | Status: DC | PRN

## 2023-01-12 MED ORDER — INSULIN GLARGINE-YFGN 100 UNIT/ML ~~LOC~~ SOLN
40.0000 [IU] | Freq: Every day | SUBCUTANEOUS | Status: DC
  Administered 2023-01-13 – 2023-01-14 (×3): 40 [IU] via SUBCUTANEOUS
  Filled 2023-01-12 (×4): qty 0.4

## 2023-01-12 MED ORDER — INSULIN ASPART 100 UNIT/ML IJ SOLN
0.0000 [IU] | Freq: Every day | INTRAMUSCULAR | Status: DC
  Administered 2023-01-13: 2 [IU] via SUBCUTANEOUS
  Filled 2023-01-12: qty 1

## 2023-01-12 MED ORDER — ACETAMINOPHEN 650 MG RE SUPP: 650.0000 mg | Freq: Four times a day (QID) | RECTAL | Status: DC | PRN

## 2023-01-12 MED ORDER — IOHEXOL 300 MG/ML  SOLN
100.0000 mL | Freq: Once | INTRAMUSCULAR | Status: AC | PRN
  Administered 2023-01-12: 100 mL via INTRAVENOUS

## 2023-01-12 MED ORDER — ACETAMINOPHEN 325 MG PO TABS
650.0000 mg | ORAL_TABLET | Freq: Four times a day (QID) | ORAL | Status: DC | PRN
  Administered 2023-01-13 – 2023-01-14 (×2): 650 mg via ORAL
  Filled 2023-01-12 (×2): qty 2

## 2023-01-12 MED ORDER — ATORVASTATIN CALCIUM 20 MG PO TABS
40.0000 mg | ORAL_TABLET | Freq: Every day | ORAL | Status: DC
  Administered 2023-01-13 – 2023-01-14 (×2): 40 mg via ORAL
  Filled 2023-01-12 (×2): qty 2

## 2023-01-12 MED ORDER — SODIUM CHLORIDE 0.9 % IV BOLUS
500.0000 mL | Freq: Once | INTRAVENOUS | Status: AC
  Administered 2023-01-12: 500 mL via INTRAVENOUS

## 2023-01-12 MED ORDER — TETANUS-DIPHTH-ACELL PERTUSSIS 5-2.5-18.5 LF-MCG/0.5 IM SUSY
0.5000 mL | PREFILLED_SYRINGE | Freq: Once | INTRAMUSCULAR | Status: AC
  Administered 2023-01-12: 0.5 mL via INTRAMUSCULAR
  Filled 2023-01-12: qty 0.5

## 2023-01-12 MED ORDER — SODIUM CHLORIDE 0.9 % IV SOLN
3.0000 g | Freq: Four times a day (QID) | INTRAVENOUS | Status: DC
  Administered 2023-01-12 – 2023-01-15 (×11): 3 g via INTRAVENOUS
  Filled 2023-01-12 (×14): qty 8

## 2023-01-12 MED ORDER — SERTRALINE HCL 50 MG PO TABS
100.0000 mg | ORAL_TABLET | Freq: Every day | ORAL | Status: DC
  Administered 2023-01-13 – 2023-01-15 (×3): 100 mg via ORAL
  Filled 2023-01-12 (×3): qty 2

## 2023-01-12 MED ORDER — ACETAMINOPHEN 500 MG PO TABS
1000.0000 mg | ORAL_TABLET | Freq: Once | ORAL | Status: DC
  Filled 2023-01-12: qty 2

## 2023-01-12 MED ORDER — SODIUM CHLORIDE 0.9 % IV BOLUS
1500.0000 mL | Freq: Once | INTRAVENOUS | Status: AC
  Administered 2023-01-12: 1500 mL via INTRAVENOUS

## 2023-01-12 MED ORDER — INSULIN ASPART 100 UNIT/ML IJ SOLN
0.0000 [IU] | Freq: Three times a day (TID) | INTRAMUSCULAR | Status: DC
  Administered 2023-01-13 (×3): 3 [IU] via SUBCUTANEOUS
  Administered 2023-01-14: 5 [IU] via SUBCUTANEOUS
  Administered 2023-01-14: 2 [IU] via SUBCUTANEOUS
  Administered 2023-01-14: 5 [IU] via SUBCUTANEOUS
  Administered 2023-01-15: 3 [IU] via SUBCUTANEOUS
  Filled 2023-01-12 (×7): qty 1

## 2023-01-12 MED ORDER — ONDANSETRON HCL 4 MG/2ML IJ SOLN: 4.0000 mg | Freq: Four times a day (QID) | INTRAMUSCULAR | Status: DC | PRN

## 2023-01-12 MED ORDER — ONDANSETRON HCL 4 MG PO TABS: 4.0000 mg | ORAL_TABLET | Freq: Four times a day (QID) | ORAL | Status: DC | PRN

## 2023-01-12 NOTE — Assessment & Plan Note (Signed)
Severe sepsis Severe sepsis criteria include fever, tachypnea, lactic acidosis and cellulitis Continue sepsis fluids Continue Unasyn Orthopedic consult in the a.m. (Dr. Signa Kell aware)

## 2023-01-12 NOTE — Progress Notes (Signed)
Pharmacy Antibiotic Note  Desiree Mccall is a 72 y.o. female admitted on 01/12/2023 with cellulitis and cat bite .  Pharmacy has been consulted for Unasyn dosing.  Plan: Unasyn 3 gm IV Q6H ordered to start on 9/12 @ 1854.   Height: 5\' 7"  [name]: 98.9 kg (218 lb) [name] (Calculated) : 61.6  Temp (24hrs), Avg:100.8 F (38.2 C), Min:97.8 F (36.6 C), Max:103.1 F (39.5 C)  Recent Labs  Lab 01/12/23 1849 01/12/23 2104  WBC 4.9  --   CREATININE 1.15*  --   LATICACIDVEN 2.5* 1.3    Estimated Creatinine Clearance: 55 mL/min (A) (by C-G formula based on SCr of 1.15 mg/dL (H)).    Not on File  Antimicrobials this admission:   >>    >>   Dose adjustments this admission:   Microbiology results:  BCx:   UCx:    Sputum:    MRSA PCR:   Thank you for allowing pharmacy to be a part of this patient's care.  Saory Carriero D 01/12/2023 11:09 PM

## 2023-01-12 NOTE — ED Notes (Signed)
Informed provider of lactic acid 2.5 Reported from lab

## 2023-01-12 NOTE — H&P (Signed)
History and Physical    Patient: Desiree Mccall:[id number] DOB: 1950/11/25 DOA: 01/12/2023 DOS: the patient was seen and examined on 01/12/2023 PCP: Abram Sander, MD  Patient coming from: Home  Chief Complaint:  Chief Complaint  Patient presents with   Animal Bite    HPI: Desiree Mccall is a 72 y.o. 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  She saw her PCP who prescribed Augmentin but she has only taken 1 day so far and the symptoms appear to be worsening and additionally she developed a fever.  The cat is up-to-date on its vaccines. ED Course and data review: Tmax 103 with pulse in the 70s and 80s but tachycardic up to the mid 20s and BP Nadear 99/35 improving to 100/35 with IV fluids.  O2 sats in the low to mid 90s on room air. Labs: WBC normal at 4.9 with lactic acid 2.5--1.3 Creatinine 1.15 above baseline of 0.8 to, blood glucose 390, alk phos 173 but CMP for the most part otherwise unremarkable. Platelets 1 39,000, [name] 1.5, BNP 191. EKG, personally viewed and interpreted showing sinus rhythm at 89 with LVH CT of the right forearm showing extensive swelling as follows IMPRESSION: 1. Extensive soft tissue swelling along the radial, extensor, and to a lesser extent flexor surfaces of the right forearm extending proximally superficial to the olecranon and peripherally into the dorsum of the hand. No loculated subcutaneous fluid collection. No retained radiopaque foreign body. No subcutaneous gas identified. 2. No acute osseous abnormality.    The ED provider spoke with orthopedist, Dr. Signa Kell who will see patient in the a.m. Patient started on Unasyn and given a Tdap Started on sepsis fluids Hospitalist consulted for  admission for severe sepsis secondary to cat bite   Review of Systems: As mentioned in the history of present illness. All other systems reviewed and are negative.  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    AICD (automatic cardioverter/defibrillator) present    Aortic dissection (HCC) 05/02/2008   Cardiac arrest (HCC) 05/2018   CHF (congestive heart failure) (HCC)    COVID-19 03/2019   Diabetes mellitus without complication (HCC)    [name]    Heart attack (HCC) 01/24/2009   Left atrial dilation    Mild mitral regurgitation by prior echocardiogram    Mild pulmonary arterial systolic hypertension (HCC)    Moderate tricuspid regurgitation by prior echocardiogram    Presence of permanent cardiac pacemaker    Right atrial dilation    Sleep apnea    CPAP   Stroke (HCC)    Several "light strokes" in past.  No deficits   Wears dentures    full upper   Past Surgical History:  Procedure Laterality Date   ABDOMINAL HYSTERECTOMY     CARDIAC CATHETERIZATION     CARDIAC SURGERY     CAROTID STENT  05/02/2008   CATARACT EXTRACTION W/PHACO Right 10/13/2022   Procedure: CATARACT EXTRACTION PHACO AND INTRAOCULAR LENS PLACEMENT (IOC) RIGHT DIABETIC  9.39  00:59.6;  Surgeon: Estanislado Pandy, MD;  Location: [facility];  Service: Ophthalmology;  Laterality: Right;  Diabetic   CHOLECYSTECTOMY  INSERT / REPLACE / REMOVE PACEMAKER     Original 2011.  Generator replaced 2019   LEFT HEART CATH AND CORONARY ANGIOGRAPHY N/A 06/05/2018   Procedure: LEFT HEART CATH AND CORONARY ANGIOGRAPHY;  Surgeon: Laurier Nancy, MD;  Location: [facility];  Service: Cardiovascular;  Laterality: N/A;   STENT PLACE LEFT URETER ([facility] HX)     Social History:  reports that she has been smoking cigarettes. She started smoking about 12 years ago. She has a 12.7 pack-year smoking history. She has never used smokeless tobacco. She reports  that she does not currently use alcohol. She reports that she does not use drugs.  Not on File  Family History  Problem Relation Age of Onset   Depression Mother    Heart failure Mother    Depression Father    Anxiety disorder Father    Heart failure Father    Depression Sister    Anxiety disorder Sister    Alcohol abuse Brother     Prior to Admission medications   Medication Sig Start Date End Date Taking? Authorizing Provider  atorvastatin (LIPITOR) 40 MG tablet Take 1 tablet (40 mg total) by mouth daily at 6 PM. 06/06/18   Judithe Modest, NP  cetirizine (ZYRTEC) 10 MG chewable tablet Chew 10 mg by mouth daily.    [provider]  clobetasol cream (TEMOVATE) 0.05 % Apply 1 Application topically daily.    [provider]  clopidogrel (PLAVIX) 75 MG tablet Take 75 mg by mouth daily.    [provider]  fluticasone (FLONASE) 50 MCG/ACT nasal spray Place 1 spray into both nostrils daily.    [provider]  furosemide (LASIX) 40 MG tablet Take 1 tablet (40 mg total) by mouth daily. 04/07/19   Leroy Sea, MD  insulin glargine (LANTUS) 100 UNIT/ML injection Inject 55 Units into the skin daily. Takes mid-day    [provider]  losartan (COZAAR) 100 MG tablet Take 100 mg by mouth daily. 09/25/19   [provider]  meclizine (ANTIVERT) 25 MG tablet Take 1 tablet (25 mg total) by mouth 3 (three) times daily as needed for dizziness. 09/03/21   Ward, Layla Maw, DO  metFORMIN (GLUCOPHAGE) 1000 MG tablet Take 1 tablet (1,000 mg total) by mouth daily with breakfast. 04/04/19 10/13/22  Ghimire, Werner Lean, MD  metoprolol succinate (TOPROL-XL) 100 MG 24 hr tablet Take 100 mg by mouth daily. 10/22/18   [provider]  Multiple Vitamin (MULTIVITAMIN WITH MINERALS) TABS tablet Take 1 tablet by mouth daily.    [provider]  ranolazine (RANEXA) 500 MG 12 hr tablet Take 500 mg by mouth daily.    [provider]   rivaroxaban (XARELTO) 20 MG TABS tablet Take 20 mg by mouth daily with supper.    [provider]  sertraline (ZOLOFT) 100 MG tablet Take 1 tablet (100 mg total) by mouth daily. 06/24/22 10/10/22  [name], MD  sertraline (ZOLOFT) 100 MG tablet Take 2 tablets (200 mg total) by mouth daily. 01/31/23 05/01/23  [name], MD  spironolactone (ALDACTONE) 25 MG tablet Take 1 tablet (25 mg total) by mouth daily. 04/07/19   Leroy Sea, MD    Physical Exam: Vitals:   01/12/23 2215 01/12/23 2230 01/12/23 2238 01/12/23 2240  BP: (!) 101/40 (!) 100/35 93/69   Pulse: 65 64 68   Resp: (!) 25 (!) 23 19   Temp:    97.8 F (36.6 C)  TempSrc:  SpO2: 90% 91% 94%   Weight:      Height:       Physical Exam Vitals and nursing note reviewed.  Constitutional:      General: She is not in acute distress. HENT:     Head: Normocephalic and atraumatic.  Cardiovascular:     Rate and Rhythm: Normal rate and regular rhythm.     Heart sounds: Normal heart sounds.  Pulmonary:     Effort: Tachypnea present.     Breath sounds: Normal breath sounds.  Abdominal:     Palpations: Abdomen is soft.     Tenderness: There is no abdominal tenderness.  Musculoskeletal:     Comments: See pic below.  Redness warmth and swelling right forearm  Neurological:     Mental Status: Mental status is at baseline.     Labs on Admission: I have personally reviewed following labs and imaging studies  CBC: Recent Labs  Lab 01/12/23 1849  WBC 4.9  NEUTROABS 4.6  HGB 11.4*  HCT 34.8*  MCV 89.0  PLT 139*   Basic Metabolic Panel: Recent Labs  Lab 01/12/23 1849  NA 135  K 4.2  CL 101  CO2 21*  GLUCOSE 390*  BUN 23  CREATININE 1.15*  CALCIUM 8.3*   GFR: Estimated Creatinine Clearance: 55 mL/min (A) (by C-G formula based on SCr of 1.15 mg/dL (H)). Liver Function Tests: Recent Labs  Lab 01/12/23 1849  AST 61*  ALT 40  ALKPHOS 173*  BILITOT 0.9  PROT 6.7  ALBUMIN 3.3*   No  results for input(s): "LIPASE", "AMYLASE" in the last 168 hours. No results for input(s): "AMMONIA" in the last 168 hours. Coagulation Profile: Recent Labs  Lab 01/12/23 1849  [name] 1.5*   Cardiac Enzymes: No results for input(s): "CKTOTAL", "CKMB", "CKMBINDEX", "TROPONINI" in the last 168 hours. BNP (last 3 results) No results for input(s): "PROBNP" in the last 8760 hours. HbA1C: No results for input(s): "HGBA1C" in the last 72 hours. CBG: No results for input(s): "GLUCAP" in the last 168 hours. Lipid Profile: No results for input(s): "CHOL", "HDL", "LDLCALC", "TRIG", "CHOLHDL", "LDLDIRECT" in the last 72 hours. Thyroid Function Tests: No results for input(s): "TSH", "T4TOTAL", "FREET4", "T3FREE", "THYROIDAB" in the last 72 hours. Anemia Panel: No results for input(s): "VITAMINB12", "FOLATE", "FERRITIN", "TIBC", "IRON", "RETICCTPCT" in the last 72 hours. Urine analysis:    Component Value Date/Time   COLORURINE YELLOW (A) 10/27/2022 0832   APPEARANCEUR CLEAR (A) 10/27/2022 0832   LABSPEC 1.020 10/27/2022 0832   PHURINE 5.0 10/27/2022 0832   GLUCOSEU >=500 (A) 10/27/2022 0832   HGBUR NEGATIVE 10/27/2022 0832   BILIRUBINUR NEGATIVE 10/27/2022 0832   KETONESUR NEGATIVE 10/27/2022 0832   PROTEINUR NEGATIVE 10/27/2022 0832   UROBILINOGEN 0.2 09/19/2009 1113   NITRITE NEGATIVE 10/27/2022 0832   LEUKOCYTESUR NEGATIVE 10/27/2022 0832    Radiological Exams on Admission: CT FOREARM RIGHT W CONTRAST  Result Date: 01/12/2023 CLINICAL DATA:  Cat bite, right arm and hand swelling EXAM: CT OF THE UPPER RIGHT EXTREMITY WITH CONTRAST TECHNIQUE: Multidetector CT imaging of the upper right extremity was performed according to the standard protocol following intravenous contrast administration. RADIATION DOSE REDUCTION: This exam was performed according to the departmental dose-optimization program which includes automated exposure control, adjustment of the mA and/or kV according to patient  size and/or use of iterative reconstruction technique. CONTRAST:  OMNIPAQUE IOHEXOL 300 MG/ML  SOLN COMPARISON:  None Available. FINDINGS: Bones/Joint/Cartilage Normal alignment. No acute fracture or dislocation. No erosions or  abnormal periosteal reaction. Joint spaces are preserved. Ligaments Suboptimally assessed by CT. Muscles and Tendons Unremarkable Soft tissues There is extensive soft tissue swelling along the radial, extensor, and to a lesser extent flexor surfaces of the right forearm extending proximally superficial to the olecranon and peripherally into the dorsum of the hand. No loculated subcutaneous fluid collection. No retained radiopaque foreign body. No subcutaneous gas identified. IMPRESSION: 1. Extensive soft tissue swelling along the radial, extensor, and to a lesser extent flexor surfaces of the right forearm extending proximally superficial to the olecranon and peripherally into the dorsum of the hand. No loculated subcutaneous fluid collection. No retained radiopaque foreign body. No subcutaneous gas identified. 2. No acute osseous abnormality. Electronically Signed   By: Helyn Numbers M.D.   On: 01/12/2023 21:50   CT HAND RIGHT W CONTRAST  Result Date: 01/12/2023 CLINICAL DATA:  Cat bite, right arm and hand swelling EXAM: CT OF THE UPPER RIGHT EXTREMITY WITH CONTRAST TECHNIQUE: Multidetector CT imaging of the upper right extremity was performed according to the standard protocol following intravenous contrast administration. RADIATION DOSE REDUCTION: This exam was performed according to the departmental dose-optimization program which includes automated exposure control, adjustment of the mA and/or kV according to patient size and/or use of iterative reconstruction technique. CONTRAST:  OMNIPAQUE IOHEXOL 300 MG/ML  SOLN COMPARISON:  None Available. FINDINGS: Bones/Joint/Cartilage Normal alignment. No acute fracture or dislocation. No erosions or abnormal periosteal reaction.  Joint spaces are preserved. Ligaments Suboptimally assessed by CT. Muscles and Tendons Unremarkable Soft tissues There is extensive soft tissue swelling along the radial, extensor, and to a lesser extent flexor surfaces of the right forearm extending proximally superficial to the olecranon and peripherally into the dorsum of the hand. No loculated subcutaneous fluid collection. No retained radiopaque foreign body. No subcutaneous gas identified. IMPRESSION: 1. Extensive soft tissue swelling along the radial, extensor, and to a lesser extent flexor surfaces of the right forearm extending proximally superficial to the olecranon and peripherally into the dorsum of the hand. No loculated subcutaneous fluid collection. No retained radiopaque foreign body. No subcutaneous gas identified. 2. No acute osseous abnormality. Electronically Signed   By: Helyn Numbers M.D.   On: 01/12/2023 21:50   DG Chest Port 1 View  Result Date: 01/12/2023 CLINICAL DATA:  Questionable sepsis. EXAM: PORTABLE CHEST 1 VIEW COMPARISON:  Chest radiograph dated 10/04/2019 FINDINGS: Mild chronic in [place] coarsening. No focal consolidation, pleural effusion, pneumothorax. Stable cardiac silhouette. Vascular stent graft repair of the thoracic aorta as seen previously. Left pectoral AICD device. No acute osseous pathology. IMPRESSION: No active disease.  No significant interval change. Electronically Signed   By: Elgie Collard M.D.   On: 01/12/2023 20:23     Data Reviewed: Relevant notes from primary care and specialist visits, past discharge summaries as available in EHR, including Care Everywhere. Prior diagnostic testing as pertinent to current admission diagnoses Updated medications and problem lists for reconciliation ED course, including vitals, labs, imaging, treatment and response to treatment Triage notes, nursing and pharmacy notes and ED provider's notes Notable results as noted in HPI   Assessment and Plan: * Infected  cat bite of forearm, right, initial encounter Severe sepsis Severe sepsis criteria include fever, tachypnea, lactic acidosis and cellulitis Continue sepsis fluids Continue Unasyn Orthopedic consult in the a.m. (Dr. Signa Kell aware)  Hypotension Secondary to severe sepsis Hold BP lowering medication Continue fluid resuscitation while monitoring for fluid overload  AKI (acute kidney injury) (HCC) Secondary to ATN from  sepsis Should improve with fluid resuscitation Avoid nephrotoxins Monitor  Chronic systolic CHF (congestive heart failure) (HCC) S/p AICD last EF 20 to 25% 2020  clinically euvolemic Given hypotension will hold losartan spironolactone, metoprolol and furosemide tonight Monitor for fluid overload with IV fluid resuscitation in the management of severe sepsis Daily weights  Uncontrolled type 2 diabetes mellitus with hyperglycemia, with long-term current use of insulin (HCC) Blood glucose 390 Continue basal insulin Sliding scale coverage  CAD S/P percutaneous coronary angioplasty PAD/history of stroke/history of aortic dissection Stable Continue metoprolol, Ranexa, losartan, atorvastatin  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 Signa Kell  Advance Care Planning:   Code Status: Prior   Family Communication: none  Disposition Plan: Back to previous home environment  Severity of Illness: The appropriate patient status for this patient is INPATIENT. Inpatient status is judged to be reasonable and necessary in order to provide the required intensity of service to ensure the patient's safety. The patient's presenting symptoms, physical exam findings, and initial radiographic and laboratory data in the context of their chronic comorbidities is felt to place them at high risk for further clinical deterioration. Furthermore, it is not anticipated that the  patient will be medically stable for discharge from the hospital within 2 midnights of admission.   * I certify that at the point of admission it is my clinical judgment that the patient will require inpatient hospital care spanning beyond 2 midnights from the point of admission due to high intensity of service, high risk for further deterioration and high frequency of surveillance required.*  Author: Andris Baumann, MD 01/12/2023 10:56 PM  For on call review [email or web address].

## 2023-01-12 NOTE — Assessment & Plan Note (Signed)
Secondary to ATN from sepsis Should improve with fluid resuscitation Avoid nephrotoxins Monitor

## 2023-01-12 NOTE — Assessment & Plan Note (Addendum)
S/p AICD last EF 20 to 25% 2020  clinically euvolemic Given hypotension will hold losartan spironolactone, metoprolol and furosemide tonight Monitor for fluid overload with IV fluid resuscitation in the management of severe sepsis Daily weights

## 2023-01-12 NOTE — Assessment & Plan Note (Signed)
CPAP nightly

## 2023-01-12 NOTE — Assessment & Plan Note (Signed)
PAD/history of stroke/history of aortic dissection Stable Continue metoprolol, Ranexa, losartan, atorvastatin

## 2023-01-12 NOTE — ED Provider Notes (Addendum)
[facility] Provider Note    Event Date/Time   First MD Initiated Contact with Patient 01/12/23 1806     (approximate)   History   Animal Bite   HPI  Desiree Mccall is a 72 y.o. female   Past medical history of CHF, CAD with AICD, prior stroke who presents to the emergency department with cat bite and infection to the right forearm.  Sustained approximately 4 days ago and started on Augmentin but despite that medication has been worsening.  Redness swelling and pain to the area.  Fever developed today.    She is the owner of the cat and it is up-to-date on rabies shots.    Physical Exam   Triage Vital Signs: ED Triage Vitals  Encounter Vitals Group     BP 01/12/23 1814 (!) 145/56     Systolic BP Percentile --      Diastolic BP Percentile --      Pulse Rate 01/12/23 1814 88     Resp 01/12/23 1819 (!) 37     Temp 01/12/23 1814 (!) 103 F (39.4 C)     Temp Source 01/12/23 1814 Oral     SpO2 01/12/23 1814 95 %     Weight --      Height --      Head Circumference --      Peak Flow --      Pain Score 01/12/23 1815 3     Pain Loc --      Pain Education --      Exclude from Growth Chart --     Most recent vital signs: Vitals:   01/12/23 2238 01/12/23 2240  BP: 93/69   Pulse: 68   Resp: 19   Temp:  97.8 F (36.6 C)  SpO2: 94%     General: Awake, no distress.  CV:  Good peripheral perfusion.  Resp:  Normal effort.  Abd:  No distention.  Other:  Dorsal aspect of the forearm has swelling redness tenderness without crepitus or pain out of proportion or fluctuance.  There are some small abrasions on the wrist and thenar eminence of the hand as well.   ED Results / Procedures / Treatments   Labs (all labs ordered are listed, but only abnormal results are displayed) Labs Reviewed  BRAIN NATRIURETIC PEPTIDE - Abnormal; Notable for the following components:      Result Value   B Natriuretic Peptide 191.0 (*)    All other components  within normal limits  LACTIC ACID, PLASMA - Abnormal; Notable for the following components:   Lactic Acid, Venous 2.5 (*)    All other components within normal limits  COMPREHENSIVE METABOLIC PANEL - Abnormal; Notable for the following components:   CO2 21 (*)    Glucose, Bld 390 (*)    Creatinine, Ser 1.15 (*)    Calcium 8.3 (*)    Albumin 3.3 (*)    AST 61 (*)    Alkaline Phosphatase 173 (*)    GFR, Estimated 51 (*)    All other components within normal limits  CBC WITH DIFFERENTIAL/PLATELET - Abnormal; Notable for the following components:   Hemoglobin 11.4 (*)    HCT 34.8 (*)    Platelets 139 (*)    Lymphs Abs 0.2 (*)    Monocytes Absolute 0.0 (*)    All other components within normal limits  PROTIME-[name] - Abnormal; Notable for the following components:   Prothrombin Time 18.7 (*)  [name] 1.5 (*)    All other components within normal limits  CULTURE, BLOOD (ROUTINE X 2)  CULTURE, BLOOD (ROUTINE X 2)  LACTIC ACID, PLASMA  APTT     I ordered and reviewed the above labs they are notable for lactic 2.5  EKG  ED ECG REPORT I, Pilar Jarvis, the attending physician, personally viewed and interpreted this ECG.   Date: 01/12/2023  EKG Time: 1819  Rate: 89  Rhythm: sinus  Axis: nl  Intervals:none  ST&T Change: no stemi    RADIOLOGY I independently reviewed and interpreted chest x-ray and I see no focality or pneumothorax I also reviewed radiologist's formal read.   PROCEDURES:  Critical Care performed: Yes, see critical care procedure note(s)  .Critical Care  Performed by: Pilar Jarvis, MD Authorized by: Pilar Jarvis, MD   Critical care provider statement:    Critical care time (minutes):  30   Critical care was time spent personally by me on the following activities:  Development of treatment plan with patient or surrogate, discussions with consultants, evaluation of patient's response to treatment, examination of patient, ordering and review of laboratory  studies, ordering and review of radiographic studies, ordering and performing treatments and interventions, pulse oximetry, re-evaluation of patient's condition and review of old charts    MEDICATIONS ORDERED IN ED: Medications  [name] (UNASYN) 3 g in sodium chloride 0.9 % 100 mL IVPB (0 g Intravenous Stopped 01/12/23 1924)  Tdap (BOOSTRIX) injection 0.5 mL (0.5 mLs Intramuscular Given 01/12/23 1855)  sodium chloride 0.9 % bolus 1,500 mL (0 mLs Intravenous Stopped 01/12/23 2120)  acetaminophen (TYLENOL) tablet 650 mg (650 mg Oral Given 01/12/23 1914)  iohexol (OMNIPAQUE) 300 MG/ML solution 100 mL (100 mLs Intravenous Contrast Given 01/12/23 1946)  sodium chloride 0.9 % bolus 500 mL (500 mLs Intravenous New Bag/Given 01/12/23 2236)    External physician / consultants:  I spoke with hospitalist for admission, orthopedist for consultation and regarding care plan for this patient.   IMPRESSION / MDM / ASSESSMENT AND PLAN / ED COURSE  I reviewed the triage vital signs and the nursing notes.                                Patient's presentation is most consistent with acute presentation with potential threat to life or bodily function.  Differential diagnosis includes, but is not limited to, cellulitis from cat bite, abscess, sepsis   The patient is on the cardiac monitor to evaluate for evidence of arrhythmia and/or significant heart rate changes.  MDM:    Cat bite worsening despite oral antibiotics now meeting sepsis criteria with hypotension that is responding to fluids, lactic acidosis meets severe sepsis criteria. Give 300 cc/kg ideal body weight crystalloid for sepsis.  CT shows no deep space infection or abscess requiring drainage at this time.  Continue with IV antibiotics and admission.  Rabies vaccinated pet, no rabies vaccine now.  Tetanus given.   -- Sepsis reevaluation, blood pressure improving, maps now above 65, patient nontoxic and stable and fever has  defervesced.     FINAL CLINICAL IMPRESSION(S) / ED DIAGNOSES   Final diagnoses:  Cat bite, initial encounter  Severe sepsis (HCC)  Cellulitis of right arm     Rx / DC Orders   ED Discharge Orders     None        Note:  This document was prepared using Dragon voice recognition software and may  include unintentional dictation errors.    Pilar Jarvis, MD 01/12/23 2316    Pilar Jarvis, MD 02/06/23 1013

## 2023-01-12 NOTE — Assessment & Plan Note (Signed)
Blood glucose 390 Continue basal insulin Sliding scale coverage

## 2023-01-12 NOTE — ED Notes (Addendum)
Spoke to officer Newsom about cat bite that occurred about 1 week ago. States to have patient  call 911 when discharged.  Officer Newsom stated that he will speak to his supervisor and see if he needs to come out tonight, and would come to bedside to check out the report. Otherwise, patient is to call 911 on discharge.

## 2023-01-12 NOTE — Assessment & Plan Note (Signed)
Secondary to severe sepsis Hold BP lowering medication Continue fluid resuscitation while monitoring for fluid overload

## 2023-01-12 NOTE — ED Triage Notes (Signed)
Pt to ed from home via [facility] for "cat bite fever".  Cat bite and scratch occurred 4 days ago. Went to md yesterday and given antibiotics. Has taken one day of treatment.   Vitals for ems Temp:104 1000mg  of tylenol po NS Temp down to 102.8  R;40 Bp 145/114 Hr 88 Gbs 382

## 2023-01-12 NOTE — Assessment & Plan Note (Signed)
Left atrial thrombus Continue Xarelto

## 2023-01-13 ENCOUNTER — Other Ambulatory Visit: Payer: Self-pay

## 2023-01-13 DIAGNOSIS — S51851A Open bite of right forearm, initial encounter: Secondary | ICD-10-CM | POA: Diagnosis not present

## 2023-01-13 DIAGNOSIS — Z7901 Long term (current) use of anticoagulants: Secondary | ICD-10-CM

## 2023-01-13 DIAGNOSIS — Z8679 Personal history of other diseases of the circulatory system: Secondary | ICD-10-CM

## 2023-01-13 DIAGNOSIS — N179 Acute kidney failure, unspecified: Secondary | ICD-10-CM | POA: Diagnosis not present

## 2023-01-13 DIAGNOSIS — R6520 Severe sepsis without septic shock: Secondary | ICD-10-CM

## 2023-01-13 DIAGNOSIS — I5022 Chronic systolic (congestive) heart failure: Secondary | ICD-10-CM

## 2023-01-13 DIAGNOSIS — Z794 Long term (current) use of insulin: Secondary | ICD-10-CM

## 2023-01-13 DIAGNOSIS — E1165 Type 2 diabetes mellitus with hyperglycemia: Secondary | ICD-10-CM

## 2023-01-13 DIAGNOSIS — I739 Peripheral vascular disease, unspecified: Secondary | ICD-10-CM

## 2023-01-13 DIAGNOSIS — G4733 Obstructive sleep apnea (adult) (pediatric): Secondary | ICD-10-CM

## 2023-01-13 DIAGNOSIS — Z95810 Presence of automatic (implantable) cardiac defibrillator: Secondary | ICD-10-CM

## 2023-01-13 DIAGNOSIS — A419 Sepsis, unspecified organism: Secondary | ICD-10-CM

## 2023-01-13 DIAGNOSIS — I2510 Atherosclerotic heart disease of native coronary artery without angina pectoris: Secondary | ICD-10-CM

## 2023-01-13 DIAGNOSIS — W5501XA Bitten by cat, initial encounter: Secondary | ICD-10-CM | POA: Diagnosis not present

## 2023-01-13 DIAGNOSIS — Z9861 Coronary angioplasty status: Secondary | ICD-10-CM

## 2023-01-13 DIAGNOSIS — Z8673 Personal history of transient ischemic attack (TIA), and cerebral infarction without residual deficits: Secondary | ICD-10-CM

## 2023-01-13 LAB — PROCALCITONIN: Procalcitonin: 40.03 ng/mL

## 2023-01-13 LAB — BASIC METABOLIC PANEL
Anion gap: 7 (ref 5–15)
BUN: 23 mg/dL (ref 8–23)
CO2: 23 mmol/L (ref 22–32)
Calcium: 7.9 mg/dL — ABNORMAL LOW (ref 8.9–10.3)
Chloride: 108 mmol/L (ref 98–111)
Creatinine, Ser: 0.92 mg/dL (ref 0.44–1.00)
GFR, Estimated: >60 mL/min (ref >60)
Glucose, Bld: 220 mg/dL — ABNORMAL HIGH (ref 70–99)
Potassium: 3.8 mmol/L (ref 3.5–5.1)
Sodium: 138 mmol/L (ref 135–145)

## 2023-01-13 LAB — CBC
HCT: 32.5 % — ABNORMAL LOW (ref 36.0–46.0)
Hemoglobin: 10.6 g/dL — ABNORMAL LOW (ref 12.0–15.0)
MCH: 29.4 pg (ref 26.0–34.0)
MCHC: 32.6 g/dL (ref 30.0–36.0)
MCV: 90 fL (ref 80.0–100.0)
Platelets: 141 10*3/uL — ABNORMAL LOW (ref 150–400)
RBC: 3.61 MIL/uL — ABNORMAL LOW (ref 3.87–5.11)
RDW: 14 % (ref 11.5–15.5)
WBC: 16.8 10*3/uL — ABNORMAL HIGH (ref 4.0–10.5)
nRBC: 0 % (ref 0.0–0.2)

## 2023-01-13 LAB — GLUCOSE, CAPILLARY
Glucose-Capillary: 185 mg/dL — ABNORMAL HIGH (ref 70–99)
Glucose-Capillary: 162 mg/dL — ABNORMAL HIGH (ref 70–99)
Glucose-Capillary: 195 mg/dL — ABNORMAL HIGH (ref 70–99)

## 2023-01-13 LAB — CBG MONITORING, ED
Glucose-Capillary: 161 mg/dL — ABNORMAL HIGH (ref 70–99)
Glucose-Capillary: 221 mg/dL — ABNORMAL HIGH (ref 70–99)

## 2023-01-13 LAB — HIV ANTIBODY (ROUTINE TESTING W REFLEX): HIV Screen 4th Generation wRfx: NONREACTIVE

## 2023-01-13 MED ORDER — NICOTINE 21 MG/24HR TD PT24
21.0000 mg | MEDICATED_PATCH | Freq: Every day | TRANSDERMAL | Status: DC
  Administered 2023-01-14 – 2023-01-15 (×2): 21 mg via TRANSDERMAL
  Filled 2023-01-13 (×2): qty 1

## 2023-01-13 MED ORDER — INFLUENZA VAC A&B SURF ANT ADJ 0.5 ML IM SUSY
0.5000 mL | PREFILLED_SYRINGE | INTRAMUSCULAR | Status: AC
  Administered 2023-01-14: 0.5 mL via INTRAMUSCULAR
  Filled 2023-01-13: qty 0.5

## 2023-01-13 MED ORDER — FUROSEMIDE 40 MG PO TABS
40.0000 mg | ORAL_TABLET | Freq: Every day | ORAL | Status: DC
  Administered 2023-01-13 – 2023-01-15 (×3): 40 mg via ORAL
  Filled 2023-01-13 (×3): qty 1

## 2023-01-13 NOTE — Progress Notes (Signed)
Progress Note   Patient: Desiree Mccall UEA:[id number] DOB: 04-Nov-1950 DOA: 01/12/2023     1 DOS: the patient was seen and examined on 01/13/2023   Brief hospital course: ZHANNA BAAL is a 72 y.o. 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  In the ED Tmax 103, tachycardic, lactic acid 2.5 trended down to 1.3.  WBC 4.9.  Patient is admitted to the hospitalist service for further management evaluation of severe sepsis secondary to right upper extremity cellulitis.  Assessment and Plan: * Infected cat bite of forearm, right, initial encounter Severe sepsis secondary to cellulitis. Patient had fever, lactic acid, tachycardia. WBC count today 16. Lactic acid improved with fluids. Continue Unasyn therapy. Orthopedic consult pending at this time.  Hypotension Secondary to severe sepsis Hold antihypertensives at this time. Caution with IV fluids given history of systolic CHF.  AKI (acute kidney injury) (HCC) Secondary to ATN from sepsis Kidney function improved with IV fluids. Avoid hyperoxia drugs. Monitor daily renal function.  Chronic systolic CHF (congestive heart failure) (HCC) S/p AICD last EF 20 to 25% 2020  Patient is euvolemic.  No exacerbation noted. Hold losartan spironolactone, metoprolol and furosemide for now given low blood pressures. Caution with IV fluids. Daily weights, strict input and output  Uncontrolled type 2 diabetes mellitus with hyperglycemia, with long-term current use of insulin (HCC) Blood glucose better Continue basal insulin 40 units at bedtime (she takes 55). If sugars high may go up on Lantus to home dose. Accucheks, sliding scale coverage  CAD S/P percutaneous coronary angioplasty PAD/history of stroke/history  of aortic dissection Stable. Continue Ranexa, statin. 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  Obesity BMI 34.14- Diet, exercise and weight reduction advised.     Subjective: Patient is seen and examined today morning. She is lying in bed, denies pain. Eating fair. No overnight issues.  Physical Exam: Vitals:   01/13/23 0930 01/13/23 1000 01/13/23 1030 01/13/23 1207  BP: 134/88 (!) 128/57 131/64 116/69  Pulse: 64 (!) 59 64 73  Resp: (!) 21 16 18 16   Temp:   98.3 F (36.8 C) 98.2 F (36.8 C)  TempSrc:   Oral   SpO2: 99% 99% 97% 96%  Weight:      Height:       General - Elderly Caucasian weak female, no apparent distress HEENT - PERRLA, EOMI, atraumatic head, non tender sinuses. Lung - Clear, rales, rhonchi, wheezes. Heart - S1, S2 heard, no murmurs, rubs, trace pedal edema Neuro - Alert, awake and oriented x 3, non focal exam. Skin - Warm and dry.  Right forearm redness, swelling noted.   Data Reviewed:     Latest Ref Rng & Units 01/13/2023    4:05 AM 01/12/2023    6:49 PM 10/27/2022    7:51 AM  CBC  WBC 4.0 - 10.5 K/uL 16.8  4.9  6.3   Hemoglobin 12.0 - 15.0 g/dL 47.8  29.5  62.1   Hematocrit 36.0 - 46.0 % 32.5  34.8  36.4   Platelets 150 - 400 K/uL 141  139  237       Latest Ref Rng & Units 01/13/2023    4:05  AM 01/12/2023    6:49 PM 10/27/2022    7:51 AM  BMP  Glucose 70 - 99 mg/dL 409  811  914   BUN 8 - 23 mg/dL 23  23  14    Creatinine 0.44 - 1.00 mg/dL 7.82  9.56  2.13   Sodium 135 - 145 mmol/L 138  135  134   Potassium 3.5 - 5.1 mmol/L 3.8  4.2  4.8   Chloride 98 - 111 mmol/L 108  101  103   CO2 22 - 32 mmol/L 23  21  23    Calcium 8.9 - 10.3 mg/dL 7.9  8.3  9.0    CT FOREARM RIGHT W CONTRAST  Result Date: 01/12/2023 CLINICAL DATA:  Cat bite, right arm and hand swelling EXAM: CT OF THE UPPER RIGHT EXTREMITY WITH CONTRAST TECHNIQUE: Multidetector CT  imaging of the upper right extremity was performed according to the standard protocol following intravenous contrast administration. RADIATION DOSE REDUCTION: This exam was performed according to the departmental dose-optimization program which includes automated exposure control, adjustment of the mA and/or kV according to patient size and/or use of iterative reconstruction technique. CONTRAST:  OMNIPAQUE IOHEXOL 300 MG/ML  SOLN COMPARISON:  None Available. FINDINGS: Bones/Joint/Cartilage Normal alignment. No acute fracture or dislocation. No erosions or abnormal periosteal reaction. Joint spaces are preserved. Ligaments Suboptimally assessed by CT. Muscles and Tendons Unremarkable Soft tissues There is extensive soft tissue swelling along the radial, extensor, and to a lesser extent flexor surfaces of the right forearm extending proximally superficial to the olecranon and peripherally into the dorsum of the hand. No loculated subcutaneous fluid collection. No retained radiopaque foreign body. No subcutaneous gas identified. IMPRESSION: 1. Extensive soft tissue swelling along the radial, extensor, and to a lesser extent flexor surfaces of the right forearm extending proximally superficial to the olecranon and peripherally into the dorsum of the hand. No loculated subcutaneous fluid collection. No retained radiopaque foreign body. No subcutaneous gas identified. 2. No acute osseous abnormality. Electronically Signed   By: Helyn Numbers M.D.   On: 01/12/2023 21:50   CT HAND RIGHT W CONTRAST  Result Date: 01/12/2023 CLINICAL DATA:  Cat bite, right arm and hand swelling EXAM: CT OF THE UPPER RIGHT EXTREMITY WITH CONTRAST TECHNIQUE: Multidetector CT imaging of the upper right extremity was performed according to the standard protocol following intravenous contrast administration. RADIATION DOSE REDUCTION: This exam was performed according to the departmental dose-optimization program which includes automated  exposure control, adjustment of the mA and/or kV according to patient size and/or use of iterative reconstruction technique. CONTRAST:  OMNIPAQUE IOHEXOL 300 MG/ML  SOLN COMPARISON:  None Available. FINDINGS: Bones/Joint/Cartilage Normal alignment. No acute fracture or dislocation. No erosions or abnormal periosteal reaction. Joint spaces are preserved. Ligaments Suboptimally assessed by CT. Muscles and Tendons Unremarkable Soft tissues There is extensive soft tissue swelling along the radial, extensor, and to a lesser extent flexor surfaces of the right forearm extending proximally superficial to the olecranon and peripherally into the dorsum of the hand. No loculated subcutaneous fluid collection. No retained radiopaque foreign body. No subcutaneous gas identified. IMPRESSION: 1. Extensive soft tissue swelling along the radial, extensor, and to a lesser extent flexor surfaces of the right forearm extending proximally superficial to the olecranon and peripherally into the dorsum of the hand. No loculated subcutaneous fluid collection. No retained radiopaque foreign body. No subcutaneous gas identified. 2. No acute osseous abnormality. Electronically Signed   By: Helyn Numbers M.D.   On: 01/12/2023  21:50   DG Chest Port 1 View  Result Date: 01/12/2023 CLINICAL DATA:  Questionable sepsis. EXAM: PORTABLE CHEST 1 VIEW COMPARISON:  Chest radiograph dated 10/04/2019 FINDINGS: Mild chronic in [place] coarsening. No focal consolidation, pleural effusion, pneumothorax. Stable cardiac silhouette. Vascular stent graft repair of the thoracic aorta as seen previously. Left pectoral AICD device. No acute osseous pathology. IMPRESSION: No active disease.  No significant interval change. Electronically Signed   By: Elgie Collard M.D.   On: 01/12/2023 20:23     Family Communication: Patient understands and agrees with current antibiotic plan.  Disposition: Status is: Inpatient Remains inpatient appropriate  because: IV antibiotics for cellulitis from cat bite.  Planned Discharge Destination: Home    Time spent: 43 minutes  Author: Marcelino Duster, MD 01/13/2023 1:52 PM  For on call review [email or web address].

## 2023-01-13 NOTE — Consult Note (Signed)
ORTHOPAEDIC CONSULTATION  REQUESTING PHYSICIAN: Marcelino Duster, MD  Chief Complaint:   R forearm pain  History of Present Illness: Desiree Mccall is a 72 y.o. right-hand dominant female with a past medical history significant for diabetes, CAD, PAD, CHF s/p pacemaker, OSA, and history of arterial thrombus on Xarelto who presents with concern for right forearm infection.  She had a cat bite 5 days ago to the dorsal aspect of the forearm and was prescribed Augmentin, but had only taken 1 day of it.  Symptoms appeared to be worsening and she had a fever up to 103 degrees in the emergency department.    Patient was admitted due to meeting sepsis criteria.  She was started on Unasyn.  Blood glucose levels on admission were 390.  CT scan in the emergency department did not show focal fluid collection.  Today, the patient notes that the pain, swelling, and redness has significantly improved compared to yesterday.  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    AICD (automatic cardioverter/defibrillator) present    Aortic dissection (HCC) 05/02/2008   Cardiac arrest (HCC) 05/2018   CHF (congestive heart failure) (HCC)    COVID-19 03/2019   Diabetes mellitus without complication (HCC)    [name]    Heart attack (HCC) 01/24/2009   Left atrial dilation    Mild mitral regurgitation by prior echocardiogram    Mild pulmonary arterial systolic hypertension (HCC)    Moderate tricuspid regurgitation by prior echocardiogram    Presence of permanent cardiac pacemaker    Right atrial dilation    Sleep apnea    CPAP   Stroke (HCC)    Several "light strokes" in past.  No deficits   Wears dentures    full upper   Past Surgical History:  Procedure Laterality Date   ABDOMINAL HYSTERECTOMY     CARDIAC CATHETERIZATION     CARDIAC SURGERY     CAROTID STENT  05/02/2008    CATARACT EXTRACTION W/PHACO Right 10/13/2022   Procedure: CATARACT EXTRACTION PHACO AND INTRAOCULAR LENS PLACEMENT (IOC) RIGHT DIABETIC  9.39  00:59.6;  Surgeon: Estanislado Pandy, MD;  Location[facility];  Service: Ophthalmology;  Laterality: Right;  Diabetic   CHOLECYSTECTOMY     INSERT / REPLACE / REMOVE PACEMAKER     Original 2011.  Generator replaced 2019   LEFT HEART CATH AND CORONARY ANGIOGRAPHY N/A 06/05/2018   Procedure: LEFT HEART CATH AND CORONARY ANGIOGRAPHY;  Surgeon: Laurier Nancy, MD;  Location: [facility] INVASIVE CV [facility];  Service: Cardiovascular;  Laterality: N/A;   STENT PLACE LEFT URETER ([facility] HX)     Social History   Socioeconomic History   Marital status: Widowed    Spouse name: Not on file   Number of children: Not on file   Years of education: Not on file   Highest education level: Not on file  Occupational History   Not on file  Tobacco Use   Smoking status: Every Day    Current packs/day: 1.00    Average packs/day: 1 pack/day for 12.7 years (12.7 ttl pk-yrs)    Types: Cigarettes    Start date: 2012   Smokeless tobacco: Never   Tobacco comments:    Started smoking age 76.  Has quit several times and restarted  Vaping Use   Vaping status: Never Used  Substance and Sexual Activity   Alcohol use: Not Currently    Comment: occasionally   Drug use:  No   Sexual activity: Not Currently  Other Topics Concern   Not on file  Social History Narrative   Not on file   Social Determinants of Health   Financial Resource Strain: High Risk (08/25/2022)   [facility], [facility]   Overall Financial Resource Strain (CARDIA)    Difficulty of Paying Living Expenses: Hard  Food Insecurity: No Food Insecurity (08/25/2022)   Received from [facility], [facility]   Hunger Vital Sign    Worried About Running Out of Food in the Last Year: Never true    Ran Out of Food in the  Last Year: Never true  Transportation Needs: No Transportation Needs (08/25/2022)   Received from [facility], [facility]   [facility] - Transportation    In the past 12 months, has lack of transportation kept you from medical appointments or from getting medications?: No    Lack of Transportation (Non-Medical): No  Physical Activity: Not on file  Stress: Not on file  Social Connections: Not on file   Family History  Problem Relation Age of Onset   Depression Mother    Heart failure Mother    Depression Father    Anxiety disorder Father    Heart failure Father    Depression Sister    Anxiety disorder Sister    Alcohol abuse Brother    Not on File Prior to Admission medications   Medication Sig Start Date End Date Taking? Authorizing Provider  amoxicillin-clavulanate (AUGMENTIN) 875-125 MG tablet Take 1 tablet by mouth 2 (two) times daily. 01/11/23  Yes [provider]  pantoprazole (PROTONIX) 40 MG tablet Take 40 mg by mouth daily. 11/14/22  Yes [provider]  ranolazine (RANEXA) 500 MG 12 hr tablet Take 1 tablet by mouth 2 (two) times daily. 08/26/22 08/26/23 Yes [provider]  atorvastatin (LIPITOR) 40 MG tablet Take 1 tablet (40 mg total) by mouth daily at 6 PM. 06/06/18   Judithe Modest, NP  cetirizine (ZYRTEC) 10 MG chewable tablet Chew 10 mg by mouth daily.    [provider]  clobetasol cream (TEMOVATE) 0.05 % Apply 1 Application topically daily.    [provider]  clopidogrel (PLAVIX) 75 MG tablet Take 75 mg by mouth daily.    [provider]  fluticasone (FLONASE) 50 MCG/ACT nasal spray Place 1 spray into both nostrils daily.    [provider]  furosemide (LASIX) 40 MG tablet Take 1 tablet (40 mg total) by mouth daily. 04/07/19   Leroy Sea, MD  insulin glargine (LANTUS) 100 UNIT/ML injection Inject 55 Units into the skin daily. Takes mid-day    [provider]   losartan (COZAAR) 100 MG tablet Take 100 mg by mouth daily. 09/25/19   [provider]  meclizine (ANTIVERT) 25 MG tablet Take 1 tablet (25 mg total) by mouth 3 (three) times daily as needed for dizziness. 09/03/21   Ward, Layla Maw, DO  metFORMIN (GLUCOPHAGE) 1000 MG tablet Take 1 tablet (1,000 mg total) by mouth daily with breakfast. 04/04/19 10/13/22  Ghimire, Werner Lean, MD  metoprolol succinate (TOPROL-XL) 100 MG 24 hr tablet Take 100 mg by mouth daily. 10/22/18   [provider]  Multiple Vitamin (MULTIVITAMIN WITH MINERALS) TABS tablet Take 1 tablet by mouth daily.    [provider]  ranolazine (RANEXA) 500 MG 12 hr tablet Take 500 mg by mouth daily.  [provider]  rivaroxaban (XARELTO) 20 MG TABS tablet Take 20 mg by mouth daily with supper.    [provider]  sertraline (ZOLOFT) 100 MG tablet Take 1 tablet (100 mg total) by mouth daily. 06/24/22 10/10/22  [name], MD  sertraline (ZOLOFT) 100 MG tablet Take 2 tablets (200 mg total) by mouth daily. 01/31/23 05/01/23  [name], MD  spironolactone (ALDACTONE) 25 MG tablet Take 1 tablet (25 mg total) by mouth daily. 04/07/19   Leroy Sea, MD   Recent Labs    01/12/23 1849 01/13/23 0405  WBC 4.9 16.8*  HGB 11.4* 10.6*  HCT 34.8* 32.5*  PLT 139* 141*  K 4.2 3.8  CL 101 108  CO2 21* 23  BUN 23 23  CREATININE 1.15* 0.92  GLUCOSE 390* 220*  CALCIUM 8.3* 7.9*  [name] 1.5*  --    CT FOREARM RIGHT W CONTRAST  Result Date: 01/12/2023 CLINICAL DATA:  Cat bite, right arm and hand swelling EXAM: CT OF THE UPPER RIGHT EXTREMITY WITH CONTRAST TECHNIQUE: Multidetector CT imaging of the upper right extremity was performed according to the standard protocol following intravenous contrast administration. RADIATION DOSE REDUCTION: This exam was performed according to the departmental dose-optimization program which includes automated exposure control, adjustment of the mA and/or kV according to  patient size and/or use of iterative reconstruction technique. CONTRAST:  OMNIPAQUE IOHEXOL 300 MG/ML  SOLN COMPARISON:  None Available. FINDINGS: Bones/Joint/Cartilage Normal alignment. No acute fracture or dislocation. No erosions or abnormal periosteal reaction. Joint spaces are preserved. Ligaments Suboptimally assessed by CT. Muscles and Tendons Unremarkable Soft tissues There is extensive soft tissue swelling along the radial, extensor, and to a lesser extent flexor surfaces of the right forearm extending proximally superficial to the olecranon and peripherally into the dorsum of the hand. No loculated subcutaneous fluid collection. No retained radiopaque foreign body. No subcutaneous gas identified. IMPRESSION: 1. Extensive soft tissue swelling along the radial, extensor, and to a lesser extent flexor surfaces of the right forearm extending proximally superficial to the olecranon and peripherally into the dorsum of the hand. No loculated subcutaneous fluid collection. No retained radiopaque foreign body. No subcutaneous gas identified. 2. No acute osseous abnormality. Electronically Signed   By: Helyn Numbers M.D.   On: 01/12/2023 21:50   CT HAND RIGHT W CONTRAST  Result Date: 01/12/2023 CLINICAL DATA:  Cat bite, right arm and hand swelling EXAM: CT OF THE UPPER RIGHT EXTREMITY WITH CONTRAST TECHNIQUE: Multidetector CT imaging of the upper right extremity was performed according to the standard protocol following intravenous contrast administration. RADIATION DOSE REDUCTION: This exam was performed according to the departmental dose-optimization program which includes automated exposure control, adjustment of the mA and/or kV according to patient size and/or use of iterative reconstruction technique. CONTRAST:  OMNIPAQUE IOHEXOL 300 MG/ML  SOLN COMPARISON:  None Available. FINDINGS: Bones/Joint/Cartilage Normal alignment. No acute fracture or dislocation. No erosions or abnormal periosteal  reaction. Joint spaces are preserved. Ligaments Suboptimally assessed by CT. Muscles and Tendons Unremarkable Soft tissues There is extensive soft tissue swelling along the radial, extensor, and to a lesser extent flexor surfaces of the right forearm extending proximally superficial to the olecranon and peripherally into the dorsum of the hand. No loculated subcutaneous fluid collection. No retained radiopaque foreign body. No subcutaneous gas identified. IMPRESSION: 1. Extensive soft tissue swelling along the radial, extensor, and to a lesser extent flexor surfaces of the right forearm extending proximally superficial to the olecranon and peripherally  into the dorsum of the hand. No loculated subcutaneous fluid collection. No retained radiopaque foreign body. No subcutaneous gas identified. 2. No acute osseous abnormality. Electronically Signed   By: Helyn Numbers M.D.   On: 01/12/2023 21:50   DG Chest Port 1 View  Result Date: 01/12/2023 CLINICAL DATA:  Questionable sepsis. EXAM: PORTABLE CHEST 1 VIEW COMPARISON:  Chest radiograph dated 10/04/2019 FINDINGS: Mild chronic in [place] [employer] coarsening. No focal consolidation, pleural effusion, pneumothorax. Stable cardiac silhouette. Vascular stent graft repair of the thoracic aorta as seen previously. Left pectoral AICD device. No acute osseous pathology. IMPRESSION: No active disease.  No significant interval change. Electronically Signed   By: Elgie Collard M.D.   On: 01/12/2023 20:23     Positive ROS: All other systems have been reviewed and were otherwise negative with the exception of those mentioned in the HPI and as above.  Physical Exam: BP (!) 107/56   Pulse (!) 54   Temp 97.6 F (36.4 C) (Oral)   Resp 17   Ht 5\' 7"  (1.702 m)   Wt 98.9 kg   SpO2 97%   BMI 34.14 kg/m  General:  Alert, no acute distress Psychiatric:  Patient is competent for consent with normal mood and affect    Orthopedic Exam:  RUE: +ain/pin/u motor SILT  r/u/m/ax +rad pulse RoM of wrist and elbow are relatively painless and close to full There is no significant tenderness to palpation over the hand, wrist, forearm, and elbow There is significant erythema along the dorsal aspect of the forearm, but compared to pictures from yesterday, this appears improved There is mild soft tissue swelling in the region of erythema about the forearm No palpable fluid collection. Regions of puncture wounds from cat bite on the dorsal forearm are not draining and do not have any significant fluid collections about them  Imaging:  As above: CT scan of the forearm and wrist shows significant soft tissue swelling about the extensor surface without focal fluid collection  Assessment/Plan: 72 y.o. female with likely right upper extremity cellulitis after a cat bite.  Patient appears to be making significant improvement on IV antibiotics. 1.  No current plan for surgical management given clinical exam and lack of specific imaging findings amenable to surgery. 2.  Continue IV antibiotics 3.  Will plan to follow peripherally.  Please page with any questions or if patient clinically worsens.   Signa Kell   01/13/2023 8:32 AM

## 2023-01-13 NOTE — Progress Notes (Signed)
Pt requesting shower chair before discharge please.

## 2023-01-13 NOTE — Progress Notes (Signed)
Pt appears to be fluid overloaded - mod SOB with activity, mild SOB at rest with audible wheezes. 92% at rest and 89% with activity. Dr. Clide Dales notified and orders received. IVF stopped and PO Lasix restarted.

## 2023-01-13 NOTE — ED Notes (Signed)
Blood pressure cuff moved to right leg

## 2023-01-13 NOTE — ED Notes (Addendum)
Swelling and some redness noted along the radial area of arm and anterior surfaces of the right forearm due to cat bite that occurred one week ago. Cat is up to date on all vaccines per patient.

## 2023-01-14 DIAGNOSIS — W5501XA Bitten by cat, initial encounter: Secondary | ICD-10-CM | POA: Diagnosis not present

## 2023-01-14 DIAGNOSIS — S51851A Open bite of right forearm, initial encounter: Secondary | ICD-10-CM | POA: Diagnosis not present

## 2023-01-14 DIAGNOSIS — L089 Local infection of the skin and subcutaneous tissue, unspecified: Secondary | ICD-10-CM | POA: Diagnosis not present

## 2023-01-14 LAB — HEMOGLOBIN A1C
Hgb A1c MFr Bld: 8.9 % — ABNORMAL HIGH (ref 4.8–5.6)
Mean Plasma Glucose: 209 mg/dL

## 2023-01-14 LAB — BASIC METABOLIC PANEL
Anion gap: 10 (ref 5–15)
BUN: 16 mg/dL (ref 8–23)
CO2: 23 mmol/L (ref 22–32)
Calcium: 7.9 mg/dL — ABNORMAL LOW (ref 8.9–10.3)
Chloride: 104 mmol/L (ref 98–111)
Creatinine, Ser: 0.72 mg/dL (ref 0.44–1.00)
GFR, Estimated: >60 mL/min (ref >60)
Glucose, Bld: 150 mg/dL — ABNORMAL HIGH (ref 70–99)
Potassium: 3.4 mmol/L — ABNORMAL LOW (ref 3.5–5.1)
Sodium: 137 mmol/L (ref 135–145)

## 2023-01-14 LAB — IRON AND TIBC
Iron: 13 ug/dL — ABNORMAL LOW (ref 28–170)
Saturation Ratios: 4 % — ABNORMAL LOW (ref 10.4–31.8)
TIBC: 297 ug/dL (ref 250–450)
UIBC: 284 ug/dL

## 2023-01-14 LAB — FOLATE: Folate: 32 ng/mL (ref >5.9)

## 2023-01-14 LAB — CBC
HCT: 29.4 % — ABNORMAL LOW (ref 36.0–46.0)
Hemoglobin: 9.8 g/dL — ABNORMAL LOW (ref 12.0–15.0)
MCH: 29 pg (ref 26.0–34.0)
MCHC: 33.3 g/dL (ref 30.0–36.0)
MCV: 87 fL (ref 80.0–100.0)
Platelets: 130 10*3/uL — ABNORMAL LOW (ref 150–400)
RBC: 3.38 MIL/uL — ABNORMAL LOW (ref 3.87–5.11)
RDW: 13.9 % (ref 11.5–15.5)
WBC: 10.8 10*3/uL — ABNORMAL HIGH (ref 4.0–10.5)
nRBC: 0 % (ref 0.0–0.2)

## 2023-01-14 LAB — MAGNESIUM: Magnesium: 2 mg/dL (ref 1.7–2.4)

## 2023-01-14 LAB — VITAMIN B12: Vitamin B-12: 90 pg/mL — ABNORMAL LOW (ref 180–914)

## 2023-01-14 LAB — GLUCOSE, CAPILLARY
Glucose-Capillary: 150 mg/dL — ABNORMAL HIGH (ref 70–99)
Glucose-Capillary: 230 mg/dL — ABNORMAL HIGH (ref 70–99)
Glucose-Capillary: 215 mg/dL — ABNORMAL HIGH (ref 70–99)
Glucose-Capillary: 110 mg/dL — ABNORMAL HIGH (ref 70–99)

## 2023-01-14 LAB — PHOSPHORUS: Phosphorus: 3.1 mg/dL (ref 2.5–4.6)

## 2023-01-14 LAB — VITAMIN D 25 HYDROXY (VIT D DEFICIENCY, FRACTURES): Vit D, 25-Hydroxy: 44.26 ng/mL (ref 30–100)

## 2023-01-14 MED ORDER — POLYSACCHARIDE IRON COMPLEX 150 MG PO CAPS
150.0000 mg | ORAL_CAPSULE | Freq: Every day | ORAL | Status: DC
  Administered 2023-01-14 – 2023-01-15 (×2): 150 mg via ORAL
  Filled 2023-01-14 (×2): qty 1

## 2023-01-14 MED ORDER — POTASSIUM CHLORIDE CRYS ER 20 MEQ PO TBCR
40.0000 meq | EXTENDED_RELEASE_TABLET | Freq: Once | ORAL | Status: AC
  Administered 2023-01-14: 40 meq via ORAL
  Filled 2023-01-14: qty 2

## 2023-01-14 MED ORDER — VITAMIN C 500 MG PO TABS
500.0000 mg | ORAL_TABLET | Freq: Every day | ORAL | Status: DC
  Administered 2023-01-14 – 2023-01-15 (×2): 500 mg via ORAL
  Filled 2023-01-14 (×2): qty 1

## 2023-01-14 NOTE — Plan of Care (Signed)

## 2023-01-14 NOTE — Plan of Care (Signed)

## 2023-01-14 NOTE — Progress Notes (Signed)
[facility] Progress Note  Patient: Desiree Mccall    BJY:[id number]  DOA: 01/12/2023     Date of Service: the patient was seen and examined on 01/14/2023  Chief Complaint  Patient presents with   Animal Bite   Brief hospital course: Desiree Mccall is a 72 y.o. 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  In the ED Tmax 103, tachycardic, lactic acid 2.5 trended down to 1.3.  WBC 4.9.  Patient is admitted to the hospitalist service for further management evaluation of severe sepsis secondary to right upper extremity cellulitis.    Assessment and Plan: # Infected cat bite of forearm, right, initial encounter Severe sepsis secondary to cellulitis. Patient had fever, lactic acid, tachycardia. WBC count 16--10.8   Lactic acid improved with fluids. Continue Unasyn therapy. Orthopedic consulted, recommended continue antibiotics, no surgical intervention.    Hypotension Secondary to severe sepsis Hold antihypertensives at this time. Caution with IV fluids given history of systolic CHF.   AKI (acute kidney injury) Resolved s/p IVF Secondary to ATN from sepsis Kidney function improved with IV fluids. Avoid hyperoxia drugs. Monitor daily renal function.   Chronic systolic CHF (congestive heart failure) S/p AICD last EF 20 to 25% 2020  Patient is euvolemic.  No exacerbation noted. Hold losartan spironolactone, metoprolol and furosemide for now given low blood pressures. Caution with IV fluids. Daily weights, strict input and output   Uncontrolled type 2 diabetes mellitus with hyperglycemia, with long-term current use of insulin  Blood glucose better Continue basal insulin 40 units at bedtime (she takes 55). If sugars high may go up on Lantus to  home dose. Accucheks, sliding scale coverage   CAD S/P percutaneous coronary angioplasty PAD/history of stroke/history of aortic dissection Stable. Continue Ranexa, statin. 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   Obesity BMI 34.14- Diet, exercise and weight reduction advised.   Body mass index is 35.94 kg/m.  Interventions:  Diet: Heart healthy/carb modified diet DVT Prophylaxis: Therapeutic Anticoagulation with Xarelto    Advance goals of care discussion: Full code  Family Communication: family was not present at bedside, at the time of interview.  The pt provided permission to discuss medical plan with the family. Opportunity was given to ask question and all questions were answered satisfactorily.   Disposition:  Pt is from Home, admitted with sepsis, left forearm cellulitis status post cat bite, still on IV antibiotics, which precludes a safe discharge. Discharge to Home, when improved, most likely tomorrow a.m.  Subjective: No significant events overnight, patient's pain is well-controlled, right arm swelling and tenderness is improving.  Denied any other complaints.  Physical Exam: General: NAD, lying comfortably Appear in no distress, affect appropriate Eyes: PERRLA ENT: Oral Mucosa Clear, moist  Neck: no JVD,  Cardiovascular: S1 and S2 Present, no Murmur,  Respiratory: good respiratory effort, Bilateral Air entry equal and Decreased, no Crackles, no wheezes Abdomen: Bowel Sound present, Soft and no tenderness,  Skin: no rashes Extremities: no Pedal edema, no calf tenderness.  Right forearm erythematous, tender and swollen. Neurologic: without any new focal findings Gait not checked due to patient safety concerns  Vitals:   01/14/23  0000 01/14/23 0600 01/14/23 0804 01/14/23 1122  BP: (!) 140/62  (!) 144/65 (!) 122/54  Pulse: 87  69 76  Resp:   20 (!) 24   Temp: 98.1 F (36.7 C)  97.9 F (36.6 C) 99 F (37.2 C)  TempSrc: Oral     SpO2: 97%  99% 95%  Weight:  104.1 kg    Height:        Intake/Output Summary (Last 24 hours) at 01/14/2023 1453 Last data filed at 01/14/2023 1406 Gross per 24 hour  Intake 440 ml  Output 300 ml  Net 140 ml   Filed Weights   01/12/23 1917 01/14/23 0600  Weight: 98.9 kg 104.1 kg    Data Reviewed: I have personally reviewed and interpreted daily labs, tele strips, imagings as discussed above. I reviewed all nursing notes, pharmacy notes, vitals, pertinent old records I have discussed plan of care as described above with RN and patient/family.  CBC: Recent Labs  Lab 01/12/23 1849 01/13/23 0405 01/14/23 0550  WBC 4.9 16.8* 10.8*  NEUTROABS 4.6  --   --   HGB 11.4* 10.6* 9.8*  HCT 34.8* 32.5* 29.4*  MCV 89.0 90.0 87.0  PLT 139* 141* 130*   Basic Metabolic Panel: Recent Labs  Lab 01/12/23 1849 01/13/23 0405 01/14/23 0550 01/14/23 0959  NA 135 138 137  --   K 4.2 3.8 3.4*  --   CL 101 108 104  --   CO2 21* 23 23  --   GLUCOSE 390* 220* 150*  --   BUN 23 23 16   --   CREATININE 1.15* 0.92 0.72  --   CALCIUM 8.3* 7.9* 7.9*  --   MG  --   --   --  2.0  PHOS  --   --   --  3.1    Studies: No results found.  Scheduled Meds:  atorvastatin  40 mg Oral q1800   furosemide  40 mg Oral Daily   insulin aspart  0-15 Units Subcutaneous TID WC   insulin aspart  0-5 Units Subcutaneous QHS   insulin glargine-yfgn  40 Units Subcutaneous QHS   nicotine  21 mg Transdermal Daily   ranolazine  500 mg Oral Daily   rivaroxaban  20 mg Oral Q breakfast   sertraline  100 mg Oral Daily   Continuous Infusions:  ampicillin-sulbactam (UNASYN) IV 3 g (01/14/23 1406)   PRN Meds: acetaminophen **OR** acetaminophen, ketorolac, ondansetron **OR** ondansetron (ZOFRAN) IV  Time spent: 35 minutes  Author: Gillis Santa. MD [facility] 01/14/2023 2:53 PM  To reach On-call, see care teams to locate the  attending and reach out to them via [email or web address]. If 7PM-7AM, please contact night-coverage If you still have difficulty reaching the attending provider, please page the [facility] (Director on Call) for [facility] on amion for assistance.

## 2023-01-15 DIAGNOSIS — S51851A Open bite of right forearm, initial encounter: Secondary | ICD-10-CM | POA: Diagnosis not present

## 2023-01-15 DIAGNOSIS — W5501XA Bitten by cat, initial encounter: Secondary | ICD-10-CM | POA: Diagnosis not present

## 2023-01-15 DIAGNOSIS — L089 Local infection of the skin and subcutaneous tissue, unspecified: Secondary | ICD-10-CM | POA: Diagnosis not present

## 2023-01-15 LAB — CBC
HCT: 31.5 % — ABNORMAL LOW (ref 36.0–46.0)
Hemoglobin: 10.5 g/dL — ABNORMAL LOW (ref 12.0–15.0)
MCH: 28.8 pg (ref 26.0–34.0)
MCHC: 33.3 g/dL (ref 30.0–36.0)
MCV: 86.3 fL (ref 80.0–100.0)
Platelets: 162 10*3/uL (ref 150–400)
RBC: 3.65 MIL/uL — ABNORMAL LOW (ref 3.87–5.11)
RDW: 13.8 % (ref 11.5–15.5)
WBC: 10.1 10*3/uL (ref 4.0–10.5)
nRBC: 0 % (ref 0.0–0.2)

## 2023-01-15 LAB — BASIC METABOLIC PANEL
Anion gap: 7 (ref 5–15)
BUN: 11 mg/dL (ref 8–23)
CO2: 25 mmol/L (ref 22–32)
Calcium: 8.3 mg/dL — ABNORMAL LOW (ref 8.9–10.3)
Chloride: 108 mmol/L (ref 98–111)
Creatinine, Ser: 0.74 mg/dL (ref 0.44–1.00)
GFR, Estimated: >60 mL/min (ref >60)
Glucose, Bld: 113 mg/dL — ABNORMAL HIGH (ref 70–99)
Potassium: 3.6 mmol/L (ref 3.5–5.1)
Sodium: 140 mmol/L (ref 135–145)

## 2023-01-15 LAB — GLUCOSE, CAPILLARY
Glucose-Capillary: 109 mg/dL — ABNORMAL HIGH (ref 70–99)
Glucose-Capillary: 151 mg/dL — ABNORMAL HIGH (ref 70–99)

## 2023-01-15 LAB — MAGNESIUM: Magnesium: 2 mg/dL (ref 1.7–2.4)

## 2023-01-15 LAB — PHOSPHORUS: Phosphorus: 3.9 mg/dL (ref 2.5–4.6)

## 2023-01-15 MED ORDER — LOSARTAN POTASSIUM 50 MG PO TABS
100.0000 mg | ORAL_TABLET | Freq: Every day | ORAL | Status: DC
  Administered 2023-01-15: 100 mg via ORAL
  Filled 2023-01-15: qty 2

## 2023-01-15 MED ORDER — ASCORBIC ACID 500 MG PO TABS: 500.0000 mg | ORAL_TABLET | Freq: Every day | ORAL | 0 refills | Status: AC

## 2023-01-15 MED ORDER — POLYSACCHARIDE IRON COMPLEX 150 MG PO CAPS: 150.0000 mg | ORAL_CAPSULE | Freq: Every day | ORAL | 0 refills | Status: AC

## 2023-01-15 MED ORDER — AMOXICILLIN-POT CLAVULANATE 875-125 MG PO TABS: 1.0000 | ORAL_TABLET | Freq: Two times a day (BID) | ORAL | 0 refills | Status: AC

## 2023-01-15 MED ORDER — CYANOCOBALAMIN 1000 MCG PO TABS: 1000.0000 ug | ORAL_TABLET | Freq: Every day | ORAL | 1 refills | Status: AC

## 2023-01-15 MED ORDER — METOPROLOL SUCCINATE ER 100 MG PO TB24
100.0000 mg | ORAL_TABLET | Freq: Every day | ORAL | Status: DC
  Administered 2023-01-15: 100 mg via ORAL
  Filled 2023-01-15: qty 1

## 2023-01-15 MED ORDER — CYANOCOBALAMIN 1000 MCG/ML IJ SOLN
1000.0000 ug | Freq: Every day | INTRAMUSCULAR | Status: DC
  Administered 2023-01-15: 1000 ug via INTRAMUSCULAR
  Filled 2023-01-15: qty 1

## 2023-01-15 MED ORDER — VITAMIN B-12 1000 MCG PO TABS: 1000.0000 ug | ORAL_TABLET | Freq: Every day | ORAL | Status: DC

## 2023-01-15 NOTE — Plan of Care (Signed)

## 2023-01-15 NOTE — Progress Notes (Signed)
Patient discharging home. All DC paperwork reviewed and sent with pt. Patient leaving with all personal belongings with grandson via private vehicle

## 2023-01-15 NOTE — Discharge Summary (Signed)
[facility] Discharge Summary   Patient: Desiree Mccall YNW:[id number]  PCP: Abram Sander, MD  Date of admission: 01/12/2023   Date of discharge:  01/15/2023     Discharge Diagnoses:  Principal Problem:   Infected cat bite of forearm, right, initial encounter Active Problems:   Severe sepsis (HCC)   Hypotension   AKI (acute kidney injury) (HCC)   Automatic implantable cardioverter-defibrillator in situ   Chronic systolic CHF (congestive heart failure) (HCC)   Uncontrolled type 2 diabetes mellitus with hyperglycemia, with long-term current use of insulin (HCC)   CAD S/P percutaneous coronary angioplasty   PAD (peripheral artery disease) (HCC)   History of stroke   History of aortic dissection   OSA on CPAP   Chronic anticoagulation   Admitted From: Home Disposition:  Home   Recommendations for Outpatient Follow-up:  PCP: in 1 wk Follow up LABS/TEST: Iron and B12 level after 3 to 6 months   Diet recommendation: Cardiac diet  Activity: The patient is advised to gradually reintroduce usual activities, as tolerated  Discharge Condition: stable  Code Status: Full code   History of present illness: As per the H and P dictated on admission Hospital Course:  [name] is a 72 y.o. 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  In the ED Tmax 103, tachycardic, lactic acid 2.5 trended down to 1.3.  WBC 4.9.  Patient is admitted to the hospitalist service for further management evaluation of severe sepsis secondary to right upper extremity cellulitis.    Assessment and Plan: # Infected cat bite of forearm, right, initial encounter Severe sepsis secondary to cellulitis. Patient had fever, lactic acid, tachycardia. WBC count 16--10.1,  Lactic acid improved with fluids. S/p Unasyn IV antibiotic given during hospital stay.  Orthopedic consulted, recommended continue antibiotics, no surgical intervention.  Cellulitis is improving, patient was discharged on Augmentin twice daily for 5 days.  Advised to follow with PCP in 1 week.   # Hypotension, Secondary to severe sepsis. Resolved  Held antihypertensives medications during hospital stay.  IV fluid was given cautiously due to history of systolic CHF.  BP improved, home medications resumed on discharge. # AKI (acute kidney injury) Resolved s/p IVF. Secondary to ATN from sepsis Kidney function improved with IV fluids.  # Chronic systolic CHF (congestive heart failure), S/p AICD last EF 20 to 25% 2020  Patient is euvolemic.  No exacerbation noted. Held losartan spironolactone, metoprolol and furosemide due to low blood pressures. S/p Cautious IV fluids.  Resumed home medications on discharge.  Patient was advised to monitor BP at home and follow-up with PCP. # Uncontrolled type 2 diabetes mellitus with hyperglycemia, with long-term current use of insulin.  Resumed home dose insulin, continue diabetic diet and monitor CBG at home. # CAD S/P percutaneous coronary angioplasty PAD/history of stroke/history of aortic dissection Stable. Continue Ranexa, statin. Resume metoprolol, losartan, on discharge.  # Chronic anticoagulation due to Left atrial thrombus: Continue Xarelto # OSA on CPAP: CPAP nightly. # Obesity  Body mass index is 35.29 kg/m.  Nutrition Interventions: Diet, exercise and weight reduction advised.  Patient was ambulatory without any assistance. On the day of the discharge the patient's vitals were stable, and no other acute medical condition were  reported by patient. the patient was felt safe to be discharge at Home.  Consultants: None Procedures: None  Discharge Exam: General: Appear in no distress, no Rash; Oral Mucosa Clear, moist. Cardiovascular: S1 and S2  Present, no Murmur, Respiratory: normal respiratory effort, Bilateral Air entry present and no Crackles, no wheezes Abdomen: Bowel Sound present, Soft and no tenderness, no hernia Extremities: no Pedal edema, no calf tenderness Neurology: alert and oriented to time, place, and person affect appropriate.  Filed Weights   01/12/23 1917 01/14/23 0600 01/15/23 0407  Weight: 98.9 kg 104.1 kg 102.2 kg   Vitals:   01/15/23 0407 01/15/23 0731  BP: 126/65 (!) 154/69  Pulse: 62 75  Resp: 20 18  Temp: 98.9 F (37.2 C)   SpO2: 97% 96%    DISCHARGE MEDICATION: Allergies as of 01/15/2023   Not on File      Medication List     TAKE these medications    amoxicillin-clavulanate 875-125 MG tablet Commonly known as: AUGMENTIN Take 1 tablet by mouth 2 (two) times daily for 5 days.   ascorbic acid 500 MG tablet Commonly known as: VITAMIN C Take 1 tablet (500 mg total) by mouth daily. Start taking on: January 16, 2023   atorvastatin 40 MG tablet Commonly known as: LIPITOR Take 1 tablet (40 mg total) by mouth daily at 6 PM.   cetirizine 10 MG chewable tablet Commonly known as: ZYRTEC Chew 10 mg by mouth daily.   clobetasol cream 0.05 % Commonly known as: TEMOVATE Apply 1 Application topically daily.   clopidogrel 75 MG tablet Commonly known as: PLAVIX Take 75 mg by mouth daily.   cyanocobalamin 1000 MCG tablet Take 1 tablet (1,000 mcg total) by mouth daily. Start taking on: January 17, 2023   fluticasone 50 MCG/ACT nasal spray Commonly known as: FLONASE Place 1 spray into both nostrils daily.   furosemide 40 MG tablet Commonly known as: LASIX Take 1 tablet (40 mg total) by mouth daily.   insulin glargine 100 UNIT/ML injection Commonly known as: LANTUS Inject 55 Units into the skin daily. Takes mid-day   iron polysaccharides 150 MG capsule Commonly known as: NIFEREX Take 1 capsule (150 mg total) by mouth daily. Start taking on: January 16, 2023   losartan  100 MG tablet Commonly known as: COZAAR Take 100 mg by mouth daily.   meclizine 25 MG tablet Commonly known as: ANTIVERT Take 1 tablet (25 mg total) by mouth 3 (three) times daily as needed for dizziness.   metFORMIN 1000 MG tablet Commonly known as: Glucophage Take 1 tablet (1,000 mg total) by mouth daily with breakfast.   metoprolol succinate 100 MG 24 hr tablet Commonly known as: TOPROL-XL Take 100 mg by mouth daily.   multivitamin with minerals Tabs tablet Take 1 tablet by mouth daily.   pantoprazole 40 MG tablet Commonly known as: PROTONIX Take 40 mg by mouth daily.   ranolazine 500 MG 12 hr tablet Commonly known as: RANEXA Take 500 mg by mouth daily. What changed: Another medication with the same name was removed. Continue taking this medication, and follow the directions you see here.   rivaroxaban 20 MG Tabs tablet Commonly known as: XARELTO Take 20 mg by mouth daily with supper.   sertraline 100 MG tablet Commonly known as: ZOLOFT Take 2 tablets (200 mg total) by mouth daily. Start taking on: January 31, 2023 What changed: Another medication with the same name was removed. Continue taking this medication, and follow the directions you see  here.   spironolactone 25 MG tablet Commonly known as: ALDACTONE Take 1 tablet (25 mg total) by mouth daily.       Not on File Discharge Instructions     Call MD for:  extreme fatigue   Complete by: As directed    Call MD for:  persistant dizziness or light-headedness   Complete by: As directed    Call MD for:  redness, tenderness, or signs of infection (pain, swelling, redness, odor or green/yellow discharge around incision site)   Complete by: As directed    Call MD for:  severe uncontrolled pain   Complete by: As directed    Call MD for:  temperature >100.4   Complete by: As directed    Diet - low sodium heart healthy   Complete by: As directed    Discharge instructions   Complete by: As directed    Follow-up  with PCP in 1 week, monitor BP at home and follow with PCP to titrate medication accordingly.  Patient was hypotensive so antihypertensive medications were held, resumed on discharge.  Blood pressure is slightly elevated today. Repeat iron profile and vitamin B12 level after 3 to 6 months.   Increase activity slowly   Complete by: As directed        The results of significant diagnostics from this hospitalization (including imaging, microbiology, ancillary and laboratory) are listed below for reference.    Significant Diagnostic Studies: CT FOREARM RIGHT W CONTRAST  Result Date: 01/12/2023 CLINICAL DATA:  Cat bite, right arm and hand swelling EXAM: CT OF THE UPPER RIGHT EXTREMITY WITH CONTRAST TECHNIQUE: Multidetector CT imaging of the upper right extremity was performed according to the standard protocol following intravenous contrast administration. RADIATION DOSE REDUCTION: This exam was performed according to the departmental dose-optimization program which includes automated exposure control, adjustment of the mA and/or kV according to patient size and/or use of iterative reconstruction technique. CONTRAST:  OMNIPAQUE IOHEXOL 300 MG/ML  SOLN COMPARISON:  None Available. FINDINGS: Bones/Joint/Cartilage Normal alignment. No acute fracture or dislocation. No erosions or abnormal periosteal reaction. Joint spaces are preserved. Ligaments Suboptimally assessed by CT. Muscles and Tendons Unremarkable Soft tissues There is extensive soft tissue swelling along the radial, extensor, and to a lesser extent flexor surfaces of the right forearm extending proximally superficial to the olecranon and peripherally into the dorsum of the hand. No loculated subcutaneous fluid collection. No retained radiopaque foreign body. No subcutaneous gas identified. IMPRESSION: 1. Extensive soft tissue swelling along the radial, extensor, and to a lesser extent flexor surfaces of the right forearm extending proximally  superficial to the olecranon and peripherally into the dorsum of the hand. No loculated subcutaneous fluid collection. No retained radiopaque foreign body. No subcutaneous gas identified. 2. No acute osseous abnormality. Electronically Signed   By: Helyn Numbers M.D.   On: 01/12/2023 21:50   CT HAND RIGHT W CONTRAST  Result Date: 01/12/2023 CLINICAL DATA:  Cat bite, right arm and hand swelling EXAM: CT OF THE UPPER RIGHT EXTREMITY WITH CONTRAST TECHNIQUE: Multidetector CT imaging of the upper right extremity was performed according to the standard protocol following intravenous contrast administration. RADIATION DOSE REDUCTION: This exam was performed according to the departmental dose-optimization program which includes automated exposure control, adjustment of the mA and/or kV according to patient size and/or use of iterative reconstruction technique. CONTRAST:  OMNIPAQUE IOHEXOL 300 MG/ML  SOLN COMPARISON:  None Available. FINDINGS: Bones/Joint/Cartilage Normal alignment. No acute fracture or dislocation. No erosions or abnormal periosteal  reaction. Joint spaces are preserved. Ligaments Suboptimally assessed by CT. Muscles and Tendons Unremarkable Soft tissues There is extensive soft tissue swelling along the radial, extensor, and to a lesser extent flexor surfaces of the right forearm extending proximally superficial to the olecranon and peripherally into the dorsum of the hand. No loculated subcutaneous fluid collection. No retained radiopaque foreign body. No subcutaneous gas identified. IMPRESSION: 1. Extensive soft tissue swelling along the radial, extensor, and to a lesser extent flexor surfaces of the right forearm extending proximally superficial to the olecranon and peripherally into the dorsum of the hand. No loculated subcutaneous fluid collection. No retained radiopaque foreign body. No subcutaneous gas identified. 2. No acute osseous abnormality. Electronically Signed   By: Helyn Numbers  M.D.   On: 01/12/2023 21:50   DG Chest Port 1 View  Result Date: 01/12/2023 CLINICAL DATA:  Questionable sepsis. EXAM: PORTABLE CHEST 1 VIEW COMPARISON:  Chest radiograph dated 10/04/2019 FINDINGS: Mild chronic in [place] coarsening. No focal consolidation, pleural effusion, pneumothorax. Stable cardiac silhouette. Vascular stent graft repair of the thoracic aorta as seen previously. Left pectoral AICD device. No acute osseous pathology. IMPRESSION: No active disease.  No significant interval change. Electronically Signed   By: Elgie Collard M.D.   On: 01/12/2023 20:23    Microbiology: Recent Results (from the past 240 hour(s))  Blood Culture (routine x 2)     Status: None (Preliminary result)   Collection Time: 01/12/23  6:20 PM   Specimen: BLOOD  Result Value Ref Range Status   Specimen Description BLOOD BLOOD RIGHT ARM  Final   Special Requests   Final    BOTTLES DRAWN AEROBIC AND ANAEROBIC Blood Culture results may not be optimal due to an inadequate volume of blood received in culture bottles   Culture   Final    NO GROWTH 3 DAYS Performed at [facility], [address] [place], [place] [address]    Report Status PENDING  Incomplete  Blood Culture (routine x 2)     Status: None (Preliminary result)   Collection Time: 01/12/23  6:25 PM   Specimen: BLOOD  Result Value Ref Range Status   Specimen Description BLOOD BLOOD RIGHT HAND  Final   Special Requests   Final    BOTTLES DRAWN AEROBIC AND ANAEROBIC Blood Culture results may not be optimal due to an inadequate volume of blood received in culture bottles   Culture   Final    NO GROWTH 3 DAYS Performed at [facility], [address] [place], [place] [address]    Report Status PENDING  Incomplete     Labs: CBC: Recent Labs  Lab 01/12/23 1849 01/13/23 0405 01/14/23 0550 01/15/23 0327  WBC 4.9 16.8* 10.8* 10.1  NEUTROABS 4.6  --   --   --   HGB 11.4* 10.6* 9.8* 10.5*  HCT 34.8* 32.5* 29.4* 31.5*   MCV 89.0 90.0 87.0 86.3  PLT 139* 141* 130* 162   Basic Metabolic Panel: Recent Labs  Lab 01/12/23 1849 01/13/23 0405 01/14/23 0550 01/14/23 0959 01/15/23 0327  NA 135 138 137  --  140  K 4.2 3.8 3.4*  --  3.6  CL 101 108 104  --  108  CO2 21* 23 23  --  25  GLUCOSE 390* 220* 150*  --  113*  BUN 23 23 16   --  11  CREATININE 1.15* 0.92 0.72  --  0.74  CALCIUM 8.3* 7.9* 7.9*  --  8.3*  MG  --   --   --  2.0 2.0  PHOS  --   --   --  3.1 3.9   Liver Function Tests: Recent Labs  Lab 01/12/23 1849  AST 61*  ALT 40  ALKPHOS 173*  BILITOT 0.9  PROT 6.7  ALBUMIN 3.3*   No results for input(s): "LIPASE", "AMYLASE" in the last 168 hours. No results for input(s): "AMMONIA" in the last 168 hours. Cardiac Enzymes: No results for input(s): "CKTOTAL", "CKMB", "CKMBINDEX", "TROPONINI" in the last 168 hours. BNP (last 3 results) Recent Labs    01/12/23 1849  BNP 191.0*   CBG: Recent Labs  Lab 01/14/23 0800 01/14/23 1123 01/14/23 1545 01/14/23 2054 01/15/23 0733  GLUCAP 150* 230* 215* 110* 109*    Time spent: 35 minutes  Signed:  Gillis Santa  [facility] 01/15/2023 10:51 AM

## 2023-01-17 LAB — CULTURE, BLOOD (ROUTINE X 2)
Culture: NO GROWTH
Culture: NO GROWTH

## 2023-02-02 ENCOUNTER — Encounter: Payer: Self-pay | Admitting: Ophthalmology

## 2023-02-03 ENCOUNTER — Encounter: Payer: Self-pay | Admitting: Ophthalmology

## 2023-02-03 NOTE — Anesthesia Preprocedure Evaluation (Addendum)
Anesthesia Evaluation  Patient identified by MRN, date of birth, ID band Patient awake    Reviewed: Allergy & Precautions, H&P , NPO status , Patient's Chart, lab work & pertinent test results  Airway Mallampati: III  TM Distance: <3 FB Neck ROM: Full   Comment: Patient has Mallampatti I airway, but very short TMD, so would expect possible difficult anterior airway if intubation were needed.  Dental no notable dental hx.    Pulmonary sleep apnea , Current Smoker and Patient abstained from smoking.   Pulmonary exam normal breath sounds clear to auscultation       Cardiovascular + CAD, + Past MI, + Peripheral Vascular Disease and +CHF  Normal cardiovascular exam+ pacemaker + Cardiac Defibrillator  Rhythm:Regular Rate:Normal  03-01-19 1. Left ventricular ejection fraction, by visual estimation, is 20 to  25%. The left ventricle has severely decreased function. Left ventricular  septal wall thickness was normal. Normal left ventricular posterior wall  thickness. There is no left  ventricular hypertrophy.   2. Moderate to large anteroseptal, apical, anteroapical, anterior and  lateral myocardial infarction.   3. Severely dilated left ventricular internal cavity size.   4. Global right ventricle has moderately reduced systolic function.The  right ventricular size is severely enlarged. No increase in right  ventricular wall thickness.   5. Left atrial size was mildly dilated.   6. Right atrial size was mildly dilated.   7. The mitral valve is abnormal. Mild mitral valve regurgitation.   8. The tricuspid valve is grossly normal. Tricuspid valve regurgitation  mild-moderate.   9. The aortic valve is normal in structure. Aortic valve regurgitation is  trivial. Mild to moderate aortic valve sclerosis/calcification without any  evidence of aortic stenosis.  10. The pulmonic valve was grossly normal. Pulmonic valve regurgitation is  not  visualized.  11. Mildly elevated pulmonary artery systolic pressure.  12. Severe ant/Apical/Septal/Lateral Hypo.  13. Mod to severe LVE.   06-05-18  Ost LAD to Prox LAD lesion is 35% stenosed.  Ost RCA to Prox RCA lesion is 70% stenosed.  Prox RCA to Mid RCA lesion is 60% stenosed.   Stent in the mid LAD no significant restenosis, no significant disease in left circumflex and high-grade lesion in the proximal RCA.  Severe left ventricular systolic dysfunction.  Advise EP evaluation and possible evaluation for PCI and drug-eluting stent of the proximal RCA but since patient is from [facility] will defer that over there.        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 Today, accucheck is 109  AICD checked January 2024  Reproductive/Obstetrics negative OB ROS  Anesthesia Physical Anesthesia Plan  ASA: 4  Anesthesia Plan: MAC   Post-op Pain Management:    Induction: Intravenous  PONV Risk Score and Plan:   Airway  Management Planned: Natural Airway and Nasal Cannula  Additional Equipment:   Intra-op Plan:   Post-operative Plan:   Informed Consent: I have reviewed the patients History and Physical, chart, labs and discussed the procedure including the risks, benefits and alternatives for the proposed anesthesia with the patient or authorized representative who has indicated his/her understanding and acceptance.     Dental Advisory Given  Plan Discussed with: Anesthesiologist, CRNA and Surgeon  Anesthesia Plan Comments: (Patient consented for risks of anesthesia including but not limited to:  - adverse reactions to medications - damage to eyes, teeth, lips or other oral mucosa - nerve damage due to positioning  - sore throat or hoarseness - Damage to heart, brain, nerves, lungs, other parts of body or loss of life  Patient voiced understanding.)         Anesthesia Quick Evaluation

## 2023-02-07 NOTE — Discharge Instructions (Signed)

## 2023-02-09 ENCOUNTER — Ambulatory Visit: Payer: Medicare HMO | Admitting: Anesthesiology

## 2023-02-09 ENCOUNTER — Encounter
Admission: RE | Disposition: A | Discharge status: Home / Self Care | Payer: Self-pay | Source: Ambulatory Visit | Attending: Ophthalmology

## 2023-02-09 ENCOUNTER — Other Ambulatory Visit: Payer: Self-pay

## 2023-02-09 ENCOUNTER — Ambulatory Visit
Admission: RE | Admit: 2023-02-09 | Discharge: 2023-02-09 | Disposition: A | Discharge status: Home / Self Care | Payer: Medicare HMO | Source: Ambulatory Visit | Attending: Ophthalmology | Admitting: Ophthalmology

## 2023-02-09 ENCOUNTER — Encounter: Payer: Self-pay | Admitting: Ophthalmology

## 2023-02-09 DIAGNOSIS — I110 Hypertensive heart disease with heart failure: Secondary | ICD-10-CM | POA: Diagnosis not present

## 2023-02-09 DIAGNOSIS — Z950 Presence of cardiac pacemaker: Secondary | ICD-10-CM | POA: Insufficient documentation

## 2023-02-09 DIAGNOSIS — I2510 Atherosclerotic heart disease of native coronary artery without angina pectoris: Secondary | ICD-10-CM | POA: Insufficient documentation

## 2023-02-09 DIAGNOSIS — Z7984 Long term (current) use of oral hypoglycemic drugs: Secondary | ICD-10-CM | POA: Insufficient documentation

## 2023-02-09 DIAGNOSIS — Z7902 Long term (current) use of antithrombotics/antiplatelets: Secondary | ICD-10-CM | POA: Diagnosis not present

## 2023-02-09 DIAGNOSIS — I509 Heart failure, unspecified: Secondary | ICD-10-CM | POA: Insufficient documentation

## 2023-02-09 DIAGNOSIS — F17210 Nicotine dependence, cigarettes, uncomplicated: Secondary | ICD-10-CM | POA: Diagnosis not present

## 2023-02-09 DIAGNOSIS — I083 Combined rheumatic disorders of mitral, aortic and tricuspid valves: Secondary | ICD-10-CM | POA: Diagnosis not present

## 2023-02-09 DIAGNOSIS — Z8673 Personal history of transient ischemic attack (TIA), and cerebral infarction without residual deficits: Secondary | ICD-10-CM | POA: Diagnosis not present

## 2023-02-09 DIAGNOSIS — E1151 Type 2 diabetes mellitus with diabetic peripheral angiopathy without gangrene: Secondary | ICD-10-CM | POA: Diagnosis not present

## 2023-02-09 DIAGNOSIS — Z8249 Family history of ischemic heart disease and other diseases of the circulatory system: Secondary | ICD-10-CM | POA: Insufficient documentation

## 2023-02-09 DIAGNOSIS — I252 Old myocardial infarction: Secondary | ICD-10-CM | POA: Insufficient documentation

## 2023-02-09 DIAGNOSIS — H2512 Age-related nuclear cataract, left eye: Secondary | ICD-10-CM | POA: Diagnosis present

## 2023-02-09 DIAGNOSIS — Z79899 Other long term (current) drug therapy: Secondary | ICD-10-CM | POA: Insufficient documentation

## 2023-02-09 DIAGNOSIS — Z7901 Long term (current) use of anticoagulants: Secondary | ICD-10-CM | POA: Insufficient documentation

## 2023-02-09 DIAGNOSIS — F419 Anxiety disorder, unspecified: Secondary | ICD-10-CM | POA: Insufficient documentation

## 2023-02-09 DIAGNOSIS — G4730 Sleep apnea, unspecified: Secondary | ICD-10-CM | POA: Insufficient documentation

## 2023-02-09 DIAGNOSIS — Z794 Long term (current) use of insulin: Secondary | ICD-10-CM | POA: Diagnosis not present

## 2023-02-09 DIAGNOSIS — E1136 Type 2 diabetes mellitus with diabetic cataract: Secondary | ICD-10-CM | POA: Insufficient documentation

## 2023-02-09 HISTORY — PX: CATARACT EXTRACTION W/PHACO: SHX586

## 2023-02-09 HISTORY — DX: Post-traumatic stress disorder, unspecified: F43.10

## 2023-02-09 LAB — GLUCOSE, CAPILLARY: Glucose-Capillary: 109 mg/dL — ABNORMAL HIGH (ref 70–99)

## 2023-02-09 SURGERY — PHACOEMULSIFICATION, CATARACT, WITH IOL INSERTION
Anesthesia: Monitor Anesthesia Care | Laterality: Left

## 2023-02-09 SURGERY — PHACOEMULSIFICATION, CATARACT, WITH IOL INSERTION
Anesthesia: Topical | Laterality: Left

## 2023-02-09 MED ORDER — LIDOCAINE HCL (PF) 2 % IJ SOLN
INTRAOCULAR | Status: DC | PRN
  Administered 2023-02-09: 4 mL via INTRAOCULAR

## 2023-02-09 MED ORDER — SODIUM CHLORIDE 0.9% FLUSH: 10.0000 mL | INTRAVENOUS | Status: DC | PRN

## 2023-02-09 MED ORDER — TETRACAINE HCL 0.5 % OP SOLN
OPHTHALMIC | Status: AC
  Filled 2023-02-09: qty 4

## 2023-02-09 MED ORDER — SIGHTPATH DOSE#1 BSS IO SOLN
INTRAOCULAR | Status: DC | PRN
  Administered 2023-02-09: 71 mL via OPHTHALMIC

## 2023-02-09 MED ORDER — SIGHTPATH DOSE#1 NA HYALUR & NA CHOND-NA HYALUR IO KIT
PACK | INTRAOCULAR | Status: DC | PRN
  Administered 2023-02-09: 1 via OPHTHALMIC

## 2023-02-09 MED ORDER — MIDAZOLAM HCL 2 MG/2ML IJ SOLN
INTRAMUSCULAR | Status: DC | PRN
  Administered 2023-02-09: 2 mg via INTRAVENOUS

## 2023-02-09 MED ORDER — BRIMONIDINE TARTRATE-TIMOLOL 0.2-0.5 % OP SOLN
OPHTHALMIC | Status: DC | PRN
  Administered 2023-02-09: 1 [drp] via OPHTHALMIC

## 2023-02-09 MED ORDER — ARMC OPHTHALMIC DILATING DROPS
OPHTHALMIC | Status: AC
  Filled 2023-02-09: qty 0.5

## 2023-02-09 MED ORDER — MOXIFLOXACIN HCL 0.5 % OP SOLN
OPHTHALMIC | Status: DC | PRN
  Administered 2023-02-09: .2 mL via OPHTHALMIC

## 2023-02-09 MED ORDER — LACTATED RINGERS IV SOLN: INTRAVENOUS | Status: DC

## 2023-02-09 MED ORDER — TETRACAINE HCL 0.5 % OP SOLN
1.0000 [drp] | OPHTHALMIC | Status: DC | PRN
  Administered 2023-02-09 (×3): 1 [drp] via OPHTHALMIC

## 2023-02-09 MED ORDER — MIDAZOLAM HCL 2 MG/2ML IJ SOLN
INTRAMUSCULAR | Status: AC
  Filled 2023-02-09: qty 2

## 2023-02-09 MED ORDER — ARMC OPHTHALMIC DILATING DROPS
1.0000 | OPHTHALMIC | Status: DC | PRN
  Administered 2023-02-09 (×3): 1 via OPHTHALMIC

## 2023-02-09 SURGICAL SUPPLY — 9 items
CATARACT SUITE SIGHTPATH (MISCELLANEOUS) ×1
DISSECTOR HYDRO NUCLEUS 50X22 (MISCELLANEOUS) ×1 IMPLANT
DRSG TEGADERM 2-3/8X2-3/4 SM (GAUZE/BANDAGES/DRESSINGS) ×1 IMPLANT
FEE CATARACT SUITE SIGHTPATH (MISCELLANEOUS) ×1 IMPLANT
GLOVE SURG SYN 7.5 E (GLOVE) ×1
GLOVE SURG SYN 7.5 PF PI (GLOVE) ×1 IMPLANT
GLOVE SURG SYN 8.5 E (GLOVE) ×1
GLOVE SURG SYN 8.5 PF PI (GLOVE) ×1 IMPLANT
LENS IOL TECNIS EYHANCE 20.5 (Intraocular Lens) IMPLANT

## 2023-02-09 NOTE — Op Note (Signed)
OPERATIVE NOTE  KESSLER KOPINSKI [id number] 02/09/2023   PREOPERATIVE DIAGNOSIS: Nuclear sclerotic cataract left eye. H25.12   POSTOPERATIVE DIAGNOSIS: Nuclear sclerotic cataract left eye. H25.12   PROCEDURE:  Phacoemusification with posterior chamber intraocular lens placement of the left eye  Ultrasound time: Procedure(s): CATARACT EXTRACTION PHACO AND INTRAOCULAR LENS PLACEMENT (IOC)  LEFT DIABETIC 9.94 00:56.0 (Left)  LENS:   Implant Name Type Inv. Item Serial No. Manufacturer Lot No. LRB No. Used Action  LENS IOL TECNIS EYHANCE 20.5 - [id number] Intraocular Lens LENS IOL TECNIS EYHANCE 20.5 [id number] [employer]  Left 1 Implanted      SURGEON:  Julious Payer. Rolley Sims, MD   ANESTHESIA:  Topical with tetracaine drops, augmented with 1% preservative-free intracameral lidocaine.   COMPLICATIONS:  None.   DESCRIPTION OF PROCEDURE:  The patient was identified in the holding room and transported to the operating room and placed in the supine position under the operating microscope.  The left eye was identified as the operative eye, which was prepped and draped in the usual sterile ophthalmic fashion.   A 1 millimeter clear-corneal paracentesis was made inferotemporally. Preservative-free 1% lidocaine mixed with 1:1,000 bisulfite-free aqueous solution of epinephrine was injected into the anterior chamber. The anterior chamber was then filled with Viscoat viscoelastic. A 2.4 millimeter keratome was used to make a clear-corneal incision superotemporally. A curvilinear capsulorrhexis was made with a cystotome and capsulorrhexis forceps. Balanced salt solution was used to hydrodissect and hydrodelineate the nucleus. Phacoemulsification was then used to remove the lens nucleus and epinucleus. The remaining cortex was then removed using the irrigation and aspiration handpiece. Provisc was then placed into the capsular bag to distend it for lens placement. A +20.50 D DIB00 intraocular lens was then injected  into the capsular bag. The remaining viscoelastic was aspirated.   Wounds were hydrated with balanced salt solution.  The anterior chamber was inflated to a physiologic pressure with balanced salt solution.  No wound leaks were noted. Vigamox was injected intracamerally.  Timolol and Brimonidine drops were applied to the eye.  The patient was taken to the recovery room in stable condition without complications of anesthesia or surgery.  [employer] 02/09/2023, 8:57 AM

## 2023-02-09 NOTE — H&P (Signed)
[facility]   Primary Care Physician:  Abram Sander, MD Ophthalmologist: Dr. Deberah Pelton  Pre-Procedure History & Physical: HPI:  Desiree Mccall is a 72 y.o. female here for cataract surgery.   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    AICD (automatic cardioverter/defibrillator) present    Aortic dissection (HCC) 05/02/2008   Cardiac arrest (HCC) 05/2018   CHF (congestive heart failure) (HCC)    COVID-19 03/2019   Diabetes mellitus without complication (HCC)    [name]    Heart attack (HCC) 01/24/2009   Left atrial dilation    Mild mitral regurgitation by prior echocardiogram    Mild pulmonary arterial systolic hypertension (HCC)    Moderate tricuspid regurgitation by prior echocardiogram    Presence of permanent cardiac pacemaker    PTSD (post-traumatic stress disorder)    Right atrial dilation    Sleep apnea    CPAP   Stroke (HCC)    Several "light strokes" in past.  No deficits   Wears dentures    full upper    Past Surgical History:  Procedure Laterality Date   ABDOMINAL HYSTERECTOMY     CARDIAC CATHETERIZATION     CARDIAC SURGERY     CAROTID STENT  05/02/2008   CATARACT EXTRACTION W/PHACO Right 10/13/2022   Procedure: CATARACT EXTRACTION PHACO AND INTRAOCULAR LENS PLACEMENT (IOC) RIGHT DIABETIC  9.39  00:59.6;  Surgeon: Estanislado Pandy, MD;  Location: [facility];  Service: Ophthalmology;  Laterality: Right;  Diabetic   CHOLECYSTECTOMY     INSERT / REPLACE / REMOVE PACEMAKER     Original 2011.  Generator replaced 2019   LEFT HEART CATH AND CORONARY ANGIOGRAPHY N/A 06/05/2018   Procedure: LEFT HEART CATH AND CORONARY ANGIOGRAPHY;  Surgeon: Laurier Nancy, MD;  Location: [facility] INVAS[facility];  Service: Cardiovascular;  Laterality: N/A;   STENT PLACE LEFT URETER ([facility] HX)      Prior to Admission medications   Medication Sig Start Date End Date Taking? Authorizing Provider   ascorbic acid (VITAMIN C) 500 MG tablet Take 1 tablet (500 mg total) by mouth daily. 01/16/23 04/16/23 Yes Gillis Santa, MD  atorvastatin (LIPITOR) 40 MG tablet Take 1 tablet (40 mg total) by mouth daily at 6 PM. 06/06/18  Yes Judithe Modest, NP  cetirizine (ZYRTEC) 10 MG chewable tablet Chew 10 mg by mouth daily.   Yes [provider]  clobetasol cream (TEMOVATE) 0.05 % Apply 1 Application topically daily.   Yes [provider]  clopidogrel (PLAVIX) 75 MG tablet Take 75 mg by mouth daily.   Yes [provider]  cyanocobalamin 1000 MCG tablet Take 1 tablet (1,000 mcg total) by mouth daily. 01/17/23 07/16/23 Yes Gillis Santa, MD  furosemide (LASIX) 40 MG tablet Take 1 tablet (40 mg total) by mouth daily. 04/07/19  Yes Leroy Sea, MD  insulin glargine (LANTUS) 100 UNIT/ML injection Inject 55 Units into the skin daily. Takes mid-day   Yes [provider]  iron polysaccharides (NIFEREX) 150 MG capsule Take 1 capsule (150 mg total) by mouth daily. 01/16/23 04/16/23 Yes Gillis Santa, MD  losartan (COZAAR) 100 MG tablet Take 100 mg by mouth daily. 09/25/19  Yes [provider]  meclizine (ANTIVERT) 25 MG tablet Take 1 tablet (25 mg total) by mouth 3 (three) times daily as needed for dizziness. 09/03/21  Yes Ward, Baxter Hire N, DO  metFORMIN (GLUCOPHAGE) 1000 MG tablet Take 1  tablet (1,000 mg total) by mouth daily with breakfast. 04/04/19 02/02/23 Yes Ghimire, Werner Lean, MD  metoprolol succinate (TOPROL-XL) 100 MG 24 hr tablet Take 100 mg by mouth daily. 10/22/18  Yes [provider]  Multiple Vitamin (MULTIVITAMIN WITH MINERALS) TABS tablet Take 1 tablet by mouth daily.   Yes [provider]  pantoprazole (PROTONIX) 40 MG tablet Take 40 mg by mouth daily. 11/14/22  Yes [provider]  ranolazine (RANEXA) 500 MG 12 hr tablet Take 500 mg by mouth daily.   Yes [provider]  rivaroxaban (XARELTO) 20 MG TABS tablet Take 20 mg by  mouth daily with supper.   Yes [provider]  sertraline (ZOLOFT) 100 MG tablet Take 2 tablets (200 mg total) by mouth daily. 01/31/23 05/01/23 Yes [name], MD  spironolactone (ALDACTONE) 25 MG tablet Take 1 tablet (25 mg total) by mouth daily. 04/07/19  Yes Leroy Sea, MD  fluticasone (FLONASE) 50 MCG/ACT nasal spray Place 1 spray into both nostrils daily.    [provider]    Allergies as of 01/30/2023   (Not on File)    Family History  Problem Relation Age of Onset   Depression Mother    Heart failure Mother    Depression Father    Anxiety disorder Father    Heart failure Father    Depression Sister    Anxiety disorder Sister    Alcohol abuse Brother     Social History   Socioeconomic History   Marital status: Widowed    Spouse name: Not on file   Number of children: Not on file   Years of education: Not on file   Highest education level: Not on file  Occupational History   Not on file  Tobacco Use   Smoking status: Every Day    Current packs/day: 1.00    Average packs/day: 1 pack/day for 12.8 years (12.8 ttl pk-yrs)    Types: Cigarettes    Start date: 2012   Smokeless tobacco: Never   Tobacco comments:    Started smoking age 7.  Has quit several times and restarted  Vaping Use   Vaping status: Never Used  Substance and Sexual Activity   Alcohol use: Not Currently    Comment: occasionally   Drug use: No   Sexual activity: Not Currently  Other Topics Concern   Not on file  Social History Narrative   Not on file   Social Determinants of Health   Financial Resource Strain: High Risk (08/25/2022)   Received from [facility], [facility]   Overall Financial Resource Strain (CARDIA)    Difficulty of Paying Living Expenses: Hard  Food Insecurity: No Food Insecurity (01/13/2023)   Hunger Vital Sign    Worried About Running Out of Food in the Last Year: Never true    Ran Out of Food in the Last  Year: Never true  Transportation Needs: No Transportation Needs (01/13/2023)   PRAPARE - Administrator, Civil Service (Medical): No    Lack of Transportation (Non-Medical): No  Physical Activity: Not on file  Stress: Not on file  Social Connections: Not on file  Intimate Partner Violence: Not At Risk (01/13/2023)   Humiliation, Afraid, Rape, and Kick questionnaire    Fear of Current or Ex-Partner: No    Emotionally Abused: No    Physically Abused: No    Sexually Abused: No    Review of Systems: See HPI, otherwise negative ROS  Physical Exam: Ht 5\' 7"  (1.702 m)   Wt 97.5 kg   BMI 33.67 kg/m  General:   Alert, cooperative in NAD Head:  Normocephalic and atraumatic. Respiratory:  Normal work of breathing. Cardiovascular:  RRR  Impression/Plan: Desiree Mccall is here for cataract surgery.  Risks, benefits, limitations, and alternatives regarding cataract surgery have been reviewed with the patient.  Questions have been answered.  All parties agreeable.   Estanislado Pandy, MD  02/09/2023, 7:05 AM

## 2023-02-09 NOTE — Transfer of Care (Signed)
Immediate Anesthesia Transfer of Care Note  Patient: Desiree Mccall  Procedure(s) Performed: CATARACT EXTRACTION PHACO AND INTRAOCULAR LENS PLACEMENT (IOC)  LEFT DIABETIC 9.94 00:56.0 (Left)  Patient Location: PACU  Anesthesia Type:MAC  Level of Consciousness: awake, alert , and oriented  Airway & Oxygen Therapy: Patient Spontanous Breathing  Post-op Assessment: Report given to RN and Post -op Vital signs reviewed and stable  Post vital signs: Reviewed and stable  Last Vitals:  Vitals Value Taken Time  BP    Temp    Pulse    Resp    SpO2      Last Pain:  Vitals:   02/09/23 0726  TempSrc: Temporal  PainSc: 0-No pain         Complications: No notable events documented.

## 2023-02-09 NOTE — Anesthesia Postprocedure Evaluation (Signed)
Anesthesia Post Note  Patient: Desiree Mccall  Procedure(s) Performed: CATARACT EXTRACTION PHACO AND INTRAOCULAR LENS PLACEMENT (IOC)  LEFT DIABETIC 9.94 00:56.0 (Left)  Patient location during evaluation: PACU Anesthesia Type: MAC Level of consciousness: awake and alert Pain management: pain level controlled Vital Signs Assessment: post-procedure vital signs reviewed and stable Respiratory status: spontaneous breathing, nonlabored ventilation, respiratory function stable and patient connected to nasal cannula oxygen Cardiovascular status: stable and blood pressure returned to baseline Postop Assessment: no apparent nausea or vomiting Anesthetic complications: no   No notable events documented.   Last Vitals:  Vitals:   02/09/23 0900 02/09/23 0906  BP: (!) 140/77 (!) 143/59  Pulse: 61 (!) 55  Resp: 15 20  Temp:    SpO2: 96% 100%    Last Pain:  Vitals:   02/09/23 0906  TempSrc:   PainSc: 0-No pain                 Jacari Kirsten C Rufina Kimery
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
## 2023-02-13 ENCOUNTER — Encounter: Payer: Self-pay | Admitting: Ophthalmology

## 2023-02-22 ENCOUNTER — Telehealth (INDEPENDENT_AMBULATORY_CARE_PROVIDER_SITE_OTHER): Payer: Medicare HMO | Admitting: Psychiatry

## 2023-02-22 ENCOUNTER — Encounter: Payer: Self-pay | Admitting: Psychiatry

## 2023-02-22 DIAGNOSIS — F411 Generalized anxiety disorder: Secondary | ICD-10-CM

## 2023-02-22 DIAGNOSIS — F4310 Post-traumatic stress disorder, unspecified: Secondary | ICD-10-CM | POA: Diagnosis not present

## 2023-02-22 DIAGNOSIS — F3341 Major depressive disorder, recurrent, in partial remission: Secondary | ICD-10-CM | POA: Diagnosis not present

## 2023-02-22 NOTE — Patient Instructions (Signed)
Continue sertraline 150 mg at night   Next appointment: 1/15 at 8 20
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
## 2023-05-17 ENCOUNTER — Encounter: Payer: Self-pay | Admitting: Psychiatry

## 2023-05-17 ENCOUNTER — Telehealth (INDEPENDENT_AMBULATORY_CARE_PROVIDER_SITE_OTHER): Payer: Medicare Other | Admitting: Psychiatry

## 2023-05-17 DIAGNOSIS — F4310 Post-traumatic stress disorder, unspecified: Secondary | ICD-10-CM | POA: Diagnosis not present

## 2023-05-17 DIAGNOSIS — F3341 Major depressive disorder, recurrent, in partial remission: Secondary | ICD-10-CM | POA: Diagnosis not present

## 2023-05-17 DIAGNOSIS — F411 Generalized anxiety disorder: Secondary | ICD-10-CM | POA: Diagnosis not present
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
## 2023-08-16 ENCOUNTER — Encounter: Payer: Self-pay | Admitting: Psychiatry

## 2023-08-16 ENCOUNTER — Telehealth: Payer: Self-pay | Admitting: Psychiatry

## 2023-08-16 DIAGNOSIS — F4310 Post-traumatic stress disorder, unspecified: Secondary | ICD-10-CM | POA: Diagnosis not present

## 2023-08-16 DIAGNOSIS — F411 Generalized anxiety disorder: Secondary | ICD-10-CM | POA: Diagnosis not present

## 2023-08-16 DIAGNOSIS — F3341 Major depressive disorder, recurrent, in partial remission: Secondary | ICD-10-CM | POA: Diagnosis not present

## 2023-08-16 NOTE — Patient Instructions (Signed)
 Continue sertraline 150 mg at night   Next appointment: 6/4 at 8 40
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
## 2023-10-04 ENCOUNTER — Telehealth (INDEPENDENT_AMBULATORY_CARE_PROVIDER_SITE_OTHER): Payer: Self-pay | Admitting: Psychiatry

## 2023-10-04 ENCOUNTER — Encounter: Payer: Self-pay | Admitting: Psychiatry

## 2023-10-04 DIAGNOSIS — F330 Major depressive disorder, recurrent, mild: Secondary | ICD-10-CM

## 2023-10-04 DIAGNOSIS — F411 Generalized anxiety disorder: Secondary | ICD-10-CM

## 2023-10-04 DIAGNOSIS — F4310 Post-traumatic stress disorder, unspecified: Secondary | ICD-10-CM

## 2023-10-04 MED ORDER — BUSPIRONE HCL 5 MG PO TABS: 5.0000 mg | ORAL_TABLET | Freq: Two times a day (BID) | ORAL | 1 refills | Status: DC

## 2023-10-04 MED ORDER — SERTRALINE HCL 100 MG PO TABS: 150.0000 mg | ORAL_TABLET | Freq: Every day | ORAL | 1 refills | Status: AC

## 2023-10-04 NOTE — Patient Instructions (Signed)
 Continue sertraline  150 mg at night   Start buspar 5 mg twice a day Next appointment: 6/4 at 8 40

## 2023-10-16 ENCOUNTER — Other Ambulatory Visit: Payer: Medicare Other

## 2023-10-24 ENCOUNTER — Other Ambulatory Visit: Payer: Self-pay | Admitting: Otolaryngology

## 2023-10-24 DIAGNOSIS — K219 Gastro-esophageal reflux disease without esophagitis: Secondary | ICD-10-CM

## 2023-10-24 DIAGNOSIS — R1310 Dysphagia, unspecified: Secondary | ICD-10-CM

## 2023-11-02 ENCOUNTER — Ambulatory Visit
Admission: RE | Admit: 2023-11-02 | Discharge: 2023-11-02 | Disposition: A | Discharge status: Home / Self Care | Source: Ambulatory Visit | Attending: Otolaryngology | Admitting: Otolaryngology

## 2023-11-02 DIAGNOSIS — K219 Gastro-esophageal reflux disease without esophagitis: Secondary | ICD-10-CM | POA: Insufficient documentation

## 2023-11-02 DIAGNOSIS — R1310 Dysphagia, unspecified: Secondary | ICD-10-CM | POA: Diagnosis present

## 2023-11-02 NOTE — Therapy (Signed)
 Modified Barium Swallow Study  Patient Details  Name: SUHEY RADFORD MRN: [id number] Date of Birth: [birth year]  Today's Date: 11/02/2023  Modified Barium Swallow completed.  Full report located under Chart Review in the Imaging Section.  History of Present Illness 73 y.o. female presents for MBSS due to c/o choking with and without POs, but mainly with solids. Laryngoscopy, 10/06/23, was unremarkable. Pt with PMHxCHF, GERD/LPR, HTN, HLD, and PTSD.   Clinical Impression Pt demonstrated adequate oropharyngeal swallow function. Age related changes appreciated including swallow initiation at the level of the pyrifom sinuses and intermittent, before the swallow, transient laryngeal penetration. Trace oral and pharyngeal stasis appreciated which was cleared via spontaneous secondary swallows. Pt with c/o globus sensation during study which did not correlate with oropharyngeal swallow function. Esophageal screening was unremarkable; however, further GI work up may be indicated given pt's complaints and hx of GERD with LPR. Recommend continuation of a regular diet with thin liquids with standard aspiration and reflux precautions.  Factors that may increase risk of adverse event in presence of aspiration Noe & Lianne 2021):  n/a  Swallow Evaluation Recommendations Recommendations: PO diet PO Diet Recommendation: Regular;Thin liquids (Level 0) Liquid Administration via: Spoon;Cup;Straw Medication Administration: Whole meds with puree Supervision: Patient able to self-feed Swallowing strategies  : Follow solids with liquids;Small bites/sips;Slow rate (reflux precuations; follow MD instructions for PPI use) Postural changes: Position pt fully upright for meals;Out of bed for meals (upright 60-90 minutes after meals) Oral care recommendations: Oral care BID (2x/day) Recommended consults: Consider GI consultation;Consider esophageal assessment     Delon Bangs, M.S., CCC-SLP Speech-Language  Pathologist [facility] [phone] (ASCOM)  Delon HERO Leetta Hendriks 11/02/2023,1:22 PM
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
## 2023-11-08 ENCOUNTER — Telehealth (INDEPENDENT_AMBULATORY_CARE_PROVIDER_SITE_OTHER): Admitting: Psychiatry

## 2023-11-08 ENCOUNTER — Encounter: Payer: Self-pay | Admitting: Psychiatry

## 2023-11-08 DIAGNOSIS — F4310 Post-traumatic stress disorder, unspecified: Secondary | ICD-10-CM

## 2023-11-08 DIAGNOSIS — F411 Generalized anxiety disorder: Secondary | ICD-10-CM

## 2023-11-08 DIAGNOSIS — F3341 Major depressive disorder, recurrent, in partial remission: Secondary | ICD-10-CM

## 2023-12-03 ENCOUNTER — Other Ambulatory Visit: Payer: Self-pay | Admitting: Psychiatry

## 2023-12-14 NOTE — Progress Notes (Unsigned)
 Sent a video visit link through Epic, but the patient didn't sign in. Tried calling for today's appointment, but got no answer. Left a voicemail instructing the patient to contact the office at [phone].

## 2023-12-22 ENCOUNTER — Telehealth (INDEPENDENT_AMBULATORY_CARE_PROVIDER_SITE_OTHER): Payer: Self-pay | Admitting: Psychiatry

## 2023-12-22 DIAGNOSIS — Z91199 Patient's noncompliance with other medical treatment and regimen due to unspecified reason: Secondary | ICD-10-CM

## 2024-05-01 ENCOUNTER — Other Ambulatory Visit: Payer: Self-pay | Admitting: Psychiatry
